# Patient Record
Sex: Male | Born: 1950 | Race: White | Hispanic: No | Marital: Married | Smoking: Former smoker
Health system: Southern US, Community
[De-identification: ages and names within clinical notes are randomized; demographics above are authoritative.]

## PROBLEM LIST (undated history)

## (undated) DIAGNOSIS — R413 Other amnesia: Secondary | ICD-10-CM

## (undated) DIAGNOSIS — Z8719 Personal history of other diseases of the digestive system: Secondary | ICD-10-CM

## (undated) DIAGNOSIS — I6529 Occlusion and stenosis of unspecified carotid artery: Secondary | ICD-10-CM

## (undated) DIAGNOSIS — E785 Hyperlipidemia, unspecified: Secondary | ICD-10-CM

## (undated) DIAGNOSIS — Z87898 Personal history of other specified conditions: Secondary | ICD-10-CM

## (undated) DIAGNOSIS — G4730 Sleep apnea, unspecified: Secondary | ICD-10-CM

## (undated) DIAGNOSIS — L309 Dermatitis, unspecified: Secondary | ICD-10-CM

## (undated) DIAGNOSIS — R0602 Shortness of breath: Secondary | ICD-10-CM

## (undated) DIAGNOSIS — G8929 Other chronic pain: Secondary | ICD-10-CM

## (undated) DIAGNOSIS — F0390 Unspecified dementia without behavioral disturbance: Secondary | ICD-10-CM

## (undated) DIAGNOSIS — I714 Abdominal aortic aneurysm, without rupture, unspecified: Secondary | ICD-10-CM

## (undated) DIAGNOSIS — I639 Cerebral infarction, unspecified: Secondary | ICD-10-CM

## (undated) DIAGNOSIS — E079 Disorder of thyroid, unspecified: Secondary | ICD-10-CM

## (undated) DIAGNOSIS — M545 Low back pain, unspecified: Secondary | ICD-10-CM

## (undated) DIAGNOSIS — R51 Headache: Secondary | ICD-10-CM

## (undated) DIAGNOSIS — G459 Transient cerebral ischemic attack, unspecified: Secondary | ICD-10-CM

## (undated) DIAGNOSIS — I10 Essential (primary) hypertension: Secondary | ICD-10-CM

## (undated) DIAGNOSIS — I219 Acute myocardial infarction, unspecified: Secondary | ICD-10-CM

## (undated) DIAGNOSIS — M1990 Unspecified osteoarthritis, unspecified site: Secondary | ICD-10-CM

## (undated) DIAGNOSIS — K219 Gastro-esophageal reflux disease without esophagitis: Secondary | ICD-10-CM

## (undated) DIAGNOSIS — J439 Emphysema, unspecified: Secondary | ICD-10-CM

## (undated) DIAGNOSIS — L932 Other local lupus erythematosus: Secondary | ICD-10-CM

## (undated) DIAGNOSIS — I2510 Atherosclerotic heart disease of native coronary artery without angina pectoris: Secondary | ICD-10-CM

## (undated) DIAGNOSIS — E039 Hypothyroidism, unspecified: Secondary | ICD-10-CM

## (undated) HISTORY — DX: Other amnesia: R41.3

## (undated) HISTORY — DX: Atherosclerotic heart disease of native coronary artery without angina pectoris: I25.10

## (undated) HISTORY — PX: HERNIA REPAIR: SHX51

## (undated) HISTORY — DX: Transient cerebral ischemic attack, unspecified: G45.9

## (undated) HISTORY — PX: OTHER SURGICAL HISTORY: SHX169

## (undated) HISTORY — DX: Acute myocardial infarction, unspecified: I21.9

## (undated) HISTORY — DX: Abdominal aortic aneurysm, without rupture: I71.4

## (undated) HISTORY — DX: Disorder of thyroid, unspecified: E07.9

## (undated) HISTORY — DX: Emphysema, unspecified: J43.9

## (undated) HISTORY — DX: Occlusion and stenosis of unspecified carotid artery: I65.29

## (undated) HISTORY — DX: Cerebral infarction, unspecified: I63.9

## (undated) HISTORY — DX: Hyperlipidemia, unspecified: E78.5

## (undated) HISTORY — DX: Gastro-esophageal reflux disease without esophagitis: K21.9

## (undated) HISTORY — DX: Sleep apnea, unspecified: G47.30

## (undated) HISTORY — DX: Abdominal aortic aneurysm, without rupture, unspecified: I71.40

## (undated) HISTORY — PX: SHOULDER SURGERY: SHX246

## (undated) HISTORY — PX: HIATAL HERNIA REPAIR: SHX195

## (undated) HISTORY — DX: Essential (primary) hypertension: I10

---

## 1998-01-26 ENCOUNTER — Ambulatory Visit (HOSPITAL_COMMUNITY): Admission: RE | Admit: 1998-01-26 | Discharge: 1998-01-26 | Payer: Self-pay | Admitting: Internal Medicine

## 2003-12-14 ENCOUNTER — Encounter (HOSPITAL_COMMUNITY): Admission: RE | Admit: 2003-12-14 | Discharge: 2003-12-15 | Payer: Self-pay | Admitting: Endocrinology

## 2003-12-25 ENCOUNTER — Ambulatory Visit (HOSPITAL_COMMUNITY): Admission: RE | Admit: 2003-12-25 | Discharge: 2003-12-25 | Payer: Self-pay | Admitting: Endocrinology

## 2006-09-21 ENCOUNTER — Emergency Department (HOSPITAL_COMMUNITY): Admission: EM | Admit: 2006-09-21 | Discharge: 2006-09-21 | Payer: Self-pay | Admitting: Emergency Medicine

## 2006-09-24 ENCOUNTER — Inpatient Hospital Stay (HOSPITAL_COMMUNITY): Admission: AD | Admit: 2006-09-24 | Discharge: 2006-09-28 | Payer: Self-pay | Admitting: Cardiovascular Disease

## 2006-09-24 ENCOUNTER — Encounter: Payer: Self-pay | Admitting: Emergency Medicine

## 2006-09-24 HISTORY — PX: CORONARY ANGIOPLASTY WITH STENT PLACEMENT: SHX49

## 2006-09-27 HISTORY — PX: CARDIAC CATHETERIZATION: SHX172

## 2007-09-04 DIAGNOSIS — I219 Acute myocardial infarction, unspecified: Secondary | ICD-10-CM

## 2007-09-04 HISTORY — PX: CORONARY ANGIOPLASTY WITH STENT PLACEMENT: SHX49

## 2007-09-04 HISTORY — DX: Acute myocardial infarction, unspecified: I21.9

## 2008-09-21 ENCOUNTER — Ambulatory Visit (HOSPITAL_COMMUNITY): Admission: RE | Admit: 2008-09-21 | Discharge: 2008-09-21 | Payer: Self-pay | Admitting: *Deleted

## 2008-09-21 ENCOUNTER — Encounter (INDEPENDENT_AMBULATORY_CARE_PROVIDER_SITE_OTHER): Payer: Self-pay | Admitting: *Deleted

## 2009-01-01 ENCOUNTER — Encounter: Admission: RE | Admit: 2009-01-01 | Discharge: 2009-01-01 | Payer: Self-pay | Admitting: Cardiovascular Disease

## 2009-01-07 ENCOUNTER — Ambulatory Visit (HOSPITAL_COMMUNITY): Admission: RE | Admit: 2009-01-07 | Discharge: 2009-01-07 | Payer: Self-pay | Admitting: Cardiovascular Disease

## 2009-01-07 HISTORY — PX: CARDIAC CATHETERIZATION: SHX172

## 2009-12-21 ENCOUNTER — Encounter: Admission: RE | Admit: 2009-12-21 | Discharge: 2009-12-21 | Payer: Self-pay | Admitting: Endocrinology

## 2010-06-26 ENCOUNTER — Encounter: Payer: Self-pay | Admitting: Endocrinology

## 2010-10-18 NOTE — Op Note (Signed)
NAMECLAUDIO, Matthew Evans                  ACCOUNT NO.:  [id number]   MEDICAL RECORD NO.:  [id number]          PATIENT TYPE:  AMB   LOCATION:  ENDO                         FACILITY:  [facility]   PHYSICIAN:  Georgiana Spinner, M.D.    DATE OF BIRTH:  1950-09-15   DATE OF PROCEDURE:  09/21/2008  DATE OF DISCHARGE:                               OPERATIVE REPORT   PROCEDURE:  Colonoscopy.   INDICATIONS:  Screening.   ANESTHESIA:  None further given.   PROCEDURE:  With the patient mildly sedated in the left lateral  decubitus position, a rectal exam was performed which was unremarkable  to my examination.  Subsequently the Pentax videoscopic colonoscope was  inserted in the rectum and passed rather easily to the cecum identified  by ileocecal valve and appendiceal orifice both of which were  photographed.  From this point, the colonoscope was slowly withdrawn  taking circumferential views of the colonic mucosa stopping in the  rectum which appeared normal on direct and showed hemorrhoids on  retroflexed view.  The endoscope was straightened and withdrawn.  The  patient's vital signs and pulse oximeter remained stable.  The patient  tolerated the procedure well without apparent complications.   FINDINGS:  Internal hemorrhoids, otherwise unremarkable exam.   PLAN:  Repeat examination in 5 years.           ______________________________  Georgiana Spinner, M.D.     GMO/MEDQ  D:  09/21/2008  T:  09/21/2008  Job:  [id number]

## 2010-10-18 NOTE — Cardiovascular Report (Signed)
NAMEGARDINER, ESPANA NO.:  [id number]   MEDICAL RECORD NO.:  [id number]          PATIENT TYPE:  OIB   LOCATION:  2899                         FACILITY:  [facility]   PHYSICIAN:  Nicki Guadalajara, M.D.     DATE OF BIRTH:  05/31/1951   DATE OF PROCEDURE:  01/07/2009  DATE OF DISCHARGE:                            CARDIAC CATHETERIZATION   INDICATIONS:  [name] is a 60 year old gentleman who suffered an  ST-segment elevation myocardial infarction in April 2008 and underwent  PTCA and stenting of his right coronary artery.  Subsequently he  underwent staged intervention with cutting balloon atherotomy to high-  grade LAD lesion, which involved multiple branches and since an  excellent angiographic result was obtained with cutting balloon, this  area was not stented so as not to jail these multiple branches.  He does  have a history of hypertension, hyperlipidemia, as well as  hypothyroidism.  Unfortunately, he has continued to smoke 2 packs of  cigarettes per day.  He also has a documented abdominal aortic aneurysm  measuring approximately 3.4 x 3.3 cm.  A recent myocardial perfusion  study showed a small in size moderate in intensity perfusion defect in  the mid anterior septal wall as well as a defect inferiorly.  There was  very mild peri-infarction inferior ischemia.  His post stress ejection  fraction was decreased to 47% from 55%.  With his ongoing tobacco use,  slight change in his nuclear perfusion study, definitive cardiac  catheterization was recommended.   PROCEDURE:  After premedication with Versed and fentanyl 25 mcg, the  patient was prepped and draped in usual fashion.  His right femoral  artery was punctured anteriorly and a 5-French sheath was inserted  without difficulty.  Diagnostic cardiac catheterization was done  utilizing 5-French Judkins for left and right coronary catheters.  A 5-  French pigtail catheter was used for biplane  cineventriculography as  well as distal aortography.  Hemostasis was obtained by direct manual  pressure.  The patient tolerated procedure well.   HEMODYNAMIC DATA:  Central aortic pressure was 110/52.  Left ventricular  pressure was 110/16.   ANGIOGRAPHIC DATA:  Left main coronary artery was a long vessel, which  had mild lumpy bumpy luminal irregularities with narrowings of 10% prior  to trifurcating into a moderate size LAD system, a diminutive  intermediate vessel and a left circumflex system.   The LAD had 20% ostial narrowing.  In the proximal to mid LAD in the  region of cutting balloon atherotomy, there was smooth 30% narrowing  without significant stenosis at site of 2 septal branches and 2 diagonal  branches.  The mid LAD had a small segment that dipped  intramyocardially, but was free of significant disease and the LAD  wrapped around the LV apex.   The intermediate vessel was a diminutive vessel.   The circumflex vessel was angiographically normal, gave rise to one  major marginal like vessel.   The right coronary artery was a moderate-sized dominant vessel that had  a widely patent proximal stent.  There was  a small anterior RV marginal  branch arising from the distal aspect of the stent that was diminutive  in diameter, but had 90% ostial stenosis.  In the mid RCA beyond the  stented segment, there was smooth eccentric 50% to less than 60%  narrowing followed by another area of 30% narrowing.  RCA supplied the  PDA and ended in inferior LV branch and posterolateral vessel.   Biplane cineventriculography revealed preserved global contractility.  There was a very minimal focal region of subtle distal inferior and  inferolateral hypocontractility seen on the RAO and LAO projections.   Distal aortography demonstrated normal renal arteries.  There was mild-  to-moderate aneurysmal dilatation in the infrarenal aorta proximal to  the iliac bifurcation.   IMPRESSION:   1. Preserved global left ventricular function with mild focal distal      inferior to inferolateral hypocontractility.  2. Luminal irregularity of 10% in the left main coronary artery.      Next, 20% ostial LAD stenosis without significant restenosis at the      site of cutting balloon atherotomy with residual smooth 30%      narrowing in the proximal to mid segment.  3. Normal circumflex coronary artery.  4. Widely patent stent in the proximal right coronary artery with      evidence for a small RV marginal branch arising from the stented      segment with 90% ostial stenosis, and smooth eccentric 50% to less      than 60% narrowing in the mid RCA beyond the stented segment      followed by another area of 30% narrowing.   RECOMMENDATION:  Medical therapy.  Again, the absolute importance of  complete smoking cessation was discussed with the patient.  Medications  will be adjusted.           ______________________________  Nicki Guadalajara, M.D.     TK/MEDQ  D:  01/07/2009  T:  01/07/2009  Job:  [id number]

## 2010-10-18 NOTE — Op Note (Signed)
Matthew Evans, Matthew Evans                  ACCOUNT NO.:  [id number]   MEDICAL RECORD NO.:  [id number]          PATIENT TYPE:  AMB   LOCATION:  ENDO                         FACILITY:  [facility]   PHYSICIAN:  Georgiana Spinner, M.D.    DATE OF BIRTH:  1951/03/29   DATE OF PROCEDURE:  09/21/2008  DATE OF DISCHARGE:                               OPERATIVE REPORT   PROCEDURE:  Upper endoscopy.   INDICATIONS:  GERD.   ANESTHESIA:  1. Fentanyl 100 mcg.  2. Versed 10 mg.   PROCEDURE:  With patient mildly sedated in the left lateral decubitus  position, the Pentax videoscopic endoscope was inserted in the mouth,  passed under direct vision through the esophagus, which appeared normal,  into the stomach, fundus, body, antrum.  The duodenal bulb, second  portion of duodenum appeared normal.  From this point the endoscope was  slowly withdrawn, taking circumferential views of the duodenal mucosa  until the endoscope had been pulled back into the stomach, placed in  retroflexion to view the stomach from below.  The endoscope was  straightened and withdrawn, taking circumferential views of the  remaining gastric and esophageal mucosa.  The patient's vital signs,  pulse oximeter remained stable.  The patient tolerated procedure well  without apparent complication.   FINDINGS:  There was some food seen in the stomach that was undigested  but a small amount, but otherwise an unremarkable examination.   PLAN:  Will have patient follow up with me as needed for this since it  appears he is pretty asymptomatic currently and proceed to colonoscopy  as planned.           ______________________________  Georgiana Spinner, M.D.     GMO/MEDQ  D:  09/21/2008  T:  09/21/2008  Job:  [id number]

## 2010-10-21 NOTE — Cardiovascular Report (Signed)
Matthew Evans, Matthew Evans NO.:  [id number]   MEDICAL RECORD NO.:  [id number]          PATIENT TYPE:  INP   LOCATION:  2923                         FACILITY:  [facility]   PHYSICIAN:  Nicki Guadalajara, M.D.     DATE OF BIRTH:  02-23-1951   DATE OF PROCEDURE:  DATE OF DISCHARGE:                            CARDIAC CATHETERIZATION   PROCEDURE:  Cardiac catheterization and percutaneous coronary  intervention.   INDICATIONS:  [name] is a 60 year old gentleman who has a  history of hypothyroidism, greater than 40 pack-year history of tobacco  use, who has experienced episodes of chest discomfort over the past  several days.  He apparently had presented to the emergency room on  Friday with a temperature of 101 and some vague chest discomfort.  He  was seen by the emergency room physicians, was treated with inhalers and  Zithromax.  Apparently the patient continued to have recurrent episodes  of chest tightness over the weekend.  He presented to [facility]  emergency room this morning.  We were asked to see him for evaluation.  ECG is worrisome for the inferior ischemia with flipped T-waves in II,  III and F and V3-V4.  Troponin is positive at 2.59.  CK-MB positive  18.7.  The patient is currently pain free.  He was started on heparin  and integralin and scheduled for cardiac catheterization today.   PROCEDURE:  After premedication with Versed 2 mg intravenously, the  patient prepped and draped in usual fashion.  His right femoral artery  was punctured anteriorly and a 5-French sheath was inserted.  Diagnostic  catheterization was done utilizing 5-French Judkins for left and right  coronary catheters.  A 5-French pigtail catheter was used for biplane  cine left ventriculography.  The pigtail cath was also used for distal  aortography as well as aortography with bifemoral runoff.  With a  demonstration of probable culprit lesion in his right coronary artery  although he  did have disease in the LAD, decision was made to attempt  intervention to the RCA.  The patient was treated and was given 600 mg  oral Plavix.  His 5-French sheath was upgraded to a 6-French sheath.  He  was given heparin per protocol.  ACT ultimately still was subtherapeutic  and there was question of his peripheral IV site where it was  administered not working well.  During procedure he received an  additional 3000 units of heparin.  Coronary intervention was done with a  6-French 3DRC  cordis guide.  A Luge wire was advanced down the RCA.  Predilatation was done with a 2.5 x 15-mm Maverick.  A 3.5 x 20 mm Taxus  stent was then successfully inserted and deployed x2 up to 16  atmospheres.  Post stent dilatation was done utilizing a 4.0 x 15-mm  Quantum balloon with 3 post dilatations made up to 3.96 mm.  Scout  angiography confirmed an excellent angiographic result.  The patient  tolerated procedure well and returned in satisfactory condition.   HEMODYNAMIC DATA:  Central aortic pressure was 115/63. Left  ventricular  pressure was 115/19.   ANGIOGRAPHIC DATA:  Left main coronary was angiographically normal and  trifurcated into LAD, ramus intermediate vessel and left circumflex  system.   The LAD was a moderate size vessel.  Vessel gave rise to one proximal  diagonal vessel and proximal septal perforating artery.  In the mid LAD,  there was a 75-80% eccentric diffuse stenosis which encompassed four  branches including two small size septal perforating arteries and two  diagonal vessels which arose in the immediate vicinity.  IC  nitroglycerin was administered which did not significantly improve the  lesion.  On the lateral view in particular this appeared to be an  eccentric lesion with superior plaque.   The ramus intermediate vessel was angiographically normal.   The circumflex vessel was angiographically normal gave rise to one  marginal vessel.   The right coronary was a  moderate size dominant vessel that had a 95%  focal stenosis with suggestion of nonocclusive thrombus.  The vessel  supplied a PDA several inferior LV branches and a small posterolateral  and moderate size posterolateral system.   Biplane selective artery revealed low normal LV function with mild  inferior hypercontractility in the RAO projection and with inferoapical  to low posterolateral mild hypercontractility on the LAO projection.   Distal aortography and abdominal aortic runoff was performed.  There was  no renal artery stenosis.  There was mild aneurysmal dilatation of the  infrarenal aorta just proximal to the iliac bifurcation.  There was mild  20-30% narrowing in the right common iliac artery.   Following successful percutaneous coronary intervention to the right  coronary artery with PTCA and stenting, the 95% stenosis with  nonocclusive thrombus was reduced to 0%.  The vessel was post dilated at  3.96 mm.  There was brisk TIMI III flow.  There was no evidence for  dissection.   IMPRESSION:  1. Acute coronary syndrome secondary to subtotal RCA occlusion with      95% stenosis proximally with nonocclusive thrombus.  2. Smooth eccentric 75-80% mid-LAD stenosis encompassing takeoff of      four vessels, two small septal perforating arteries and two      diagonal vessels.  3. Normal ramus intermediate and left circumflex system.  4. Low normal to mild LV dysfunction with mild hypercontractility      inferiorly and involving the inferoapical to low posterolateral      wall.  5. 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  6. Successful PTCA/stenting of the right coronary artery with ultimate      insertion of a 3.5 x 20 mm Taxus drug-eluting stent post dilated to     3.96 mm done with integralin/heparin/oral  Plavix/aspirin.   RECOMMENDATIONS:  [name] will be started on medical therapy including  beta blocker, ACE inhibition, aggressive stent therapy.  Smoking  cessation will be necessary.  He will most likely undergo staged  intervention to his LAD system electively.           ______________________________  Nicki Guadalajara, M.D.     TK/MEDQ  D:  09/24/2006  T:  09/25/2006  Job:  [id number]   cc:   Nicki Guadalajara, M.D.  Patient Chart

## 2010-10-21 NOTE — Discharge Summary (Signed)
NAMEKENWOOD, ROSIAK NO.:  [id number]   MEDICAL RECORD NO.:  [id number]          PATIENT TYPE:  INP   LOCATION:  2923                         FACILITY:  [facility]   PHYSICIAN:  Nicki Guadalajara, M.D.     DATE OF BIRTH:  1951/03/06   DATE OF ADMISSION:  09/24/2006  DATE OF DISCHARGE:  09/28/2006                               DISCHARGE SUMMARY   HISTORY OF PRESENT ILLNESS:  Mr. Pangborn is a 60 year old male patient who  came in with chest pain.  He had been seen in the ER Friday with a cough  and a temperature.  He was sent home with inhalers and Zithromax.  His  symptoms apparently somewhat resolved over the weekend, but then they  came back, and he came back to the emergency room.  His troponin was  elevated, and his EKG showed inferolateral T wave inversion.  He was  then taken for a catheterization on September 24, 2006, by Dr. Nicki Guadalajara.  He was found to have 95% RCA with clot.  He also had a 75% to 80% in his  LAD.  His EF was 50%.  He went on to have a Taxus stent placed in his  RCA with plans for staged procedure of his LAD in a couple of days.   Post procedure, he did well, and on September 27, 2006, he underwent cutting  balloon of his LAD, and no stenting was done.  He had cardiac  medications added and titrated during his hospitalization, and he is  considered stable for discharge home on September 28, 2006.   DISCHARGE LABORATORY DATA:  Hemoglobin 13.5, hematocrit 40.4, WBC 7.4,  platelets 276, sodium 139, potassium 3.8, BUN 11, creatinine 1.09.  CK-  MBs after the second procedure were 68/1.6, troponin of 1.61 and  0.63/1.4, troponin of 1.53, and 64/1.5, troponin of 1.24.   DISCHARGE MEDICATIONS:  1. Levothyroxine 175 mcg a day.  2. Plavix 75 mg a day.  3. He is not to stop his aspirin 325 mg at bedtime 30 minutes prior to      his Niaspan 500 mg.  4. Toprol-XL 25 mg a day.  5. Imdur 30 mg a day.  6. Altace 2.5 mg twice per day.  7. Simvastatin 40 mg at  bedtime.  8. Prilosec 20 mg every day x1 month.  9. Nitroglycerin 1/150 under tongue every five minutes x3 as needed      for chest pain.  He was told if his pulse is less than 50 or he is      lightheaded, he should cut his Toprol XL in half.   He was seen by Cardiac Rehabilitation.  He was referred for cardiac  rehabilitation program.  He was seen by the smoking-cessation personnel,  and he is willing and stated that he definitely quitting smoking.   His EKG continues to show T-wave inversions in inferior leads, and he  also has Q waves in III and aVF.   DISCHARGE DIAGNOSES:  1. Acute inferior myocardial infarction, probably out of hospital.  He  had an urgent catheterization with Taxus stenting to his right      coronary artery.  He had residual disease, and he subsequently went      for a staged procedure of cutting balloon of his left anterior      descending two days later.  2. An ejection fraction of 50%.  3. Hypertension.  4. Dyslipidemia with very low HDL being treated with Niaspan and a      statin.  5. Hypertension.  6. Tobacco abuse with the patient committed to cessation.      Lezlie Octave, N.P.    ______________________________  Nicki Guadalajara, M.D.    BB/MEDQ  D:  09/28/2006  T:  09/28/2006  Job:  [id number]

## 2010-10-21 NOTE — Cardiovascular Report (Signed)
Matthew Evans, Matthew Evans NO.:  [id number]   MEDICAL RECORD NO.:  [id number]          PATIENT TYPE:  INP   LOCATION:  2923                         FACILITY:  [facility]   PHYSICIAN:  Nicki Guadalajara, M.D.     DATE OF BIRTH:  September 18, 1950   DATE OF PROCEDURE:  09/27/2006  DATE OF DISCHARGE:                            CARDIAC CATHETERIZATION   INDICATIONS:  [name] is a 60 year old gentleman who presented to  [facility] Emergency Room on Monday, September 24, 2006.  Cardiac enzymes  were positive and he had inferior T-wave inversion compatible with acute  coronary syndrome/non-ST-segment MI.  Most likely, he had experienced  this over the weekend with recurrent episodes of chest discomfort.  He  was taken to the cardiac catheterization laboratory and was found to  have 95% proximal RCA stenosis with nonocclusive thrombus.  This was  successfully intervened upon with ultimate insertion of a 3.5 x 20 Taxus  stent postdilated to 4 cm.  At the time, he was also noted to have an  80% eccentric shelf-like stenosis in the mid LAD involving the  quadrification of 4 vessels including 2 septals and 2 diagonal vessels.  The patient has done exceptionally well following his RCA intervention.  He is now brought back to the laboratory to attempt intervention to the  LAD system.   PROCEDURE:  The patient has been on Plavix.  The right femoral artery  was punctured anteriorly and a 6-French sheath was inserted.  Double-  bolus internal and weight-adjusted heparinization were administered.  ACT was documented to be therapeutic.  Initially, a diagnostic right  catheter was inserted and scout angiography revealed an excellent  angiographic result from the previous intervention with widely patent  stent in the RCA proximally.  Attention was then directed at the left  coronary system.  A 6-French FLQ curved 4 guide was used for the  procedure.  A Prowater wire was advanced down the LAD.  The  lateral  views really demonstrated that there was a significant superior  eccentric stenosis with narrowing of at least 80%.  Due to the 4 vessels  arising from this diseased segment, the initial plan was to attempt  cutting balloon arthrotomy for treatment unless an inadequate result was  obtained.  Initially, a 2.5 x 15-mm cutting balloon was inserted.  Multiple dilatations were made up to 7 atmospheres.  This was then  upgraded to a 3.0 x 15-mm cutting balloon.  Again, multiple cuts were  made with significant improvement in luminal diameter.  However, it  became apparent that the vessel was most likely of 3-4 vessel.  Consequently, a 3.5 x 15 mm cutting balloon was then inserted and 3  dilatations were made up to 4 atmospheres, corresponding to 3.42-mm  size.  Scout angiography confirmed an excellent angiographic result with  the 80% stenosis being reduced to 0%.  There was brisk TIMI-3 flow and  the vessel beyond the lesion now had significantly plumped up.  The  patient did receive at least three doses of IC nitroglycerin during the  procedure.  He tolerated the procedure well.   HEMODYNAMIC DATA:  Central aortic pressure was 117/65.   ANGIOGRAPHIC DATA:  The right coronary artery was a large-caliber  vessel.  The site of previous 95% stenosis prior nonocclusive thrombus  was now 100% opened without any residual narrowing.  There was brisk  TIMI-3 flow in a large RCA system.   The left anterior descending artery arose from the left main coronary  artery.  In the proximal to mid segment, there was significant eccentric  80% stenosis, which was seen best on the lateral views with superior  shelf.  At this site were 4 vessels which arose including 2 septal  perforating arteries and 2 diagonal vessels.  Following sequential  cutting balloon arthrotomy with ultimate dilatation with a 3.5 x 15-mm  cutting balloon, the 80% stenosis was reduced to 0%.  There was  significant improvement  with resolution of eccentricity and no  significant plaque shifting into the branch vessels.  There was brisk  TIMI-3 flow and the vessel beyond the lesion was now large-caliber  rather than fairly small secondary to the prior high-grade stenosis.   IMPRESSION:  1. Successful cutting balloon arthrotomy of an 80% eccentric left      anterior descending stenosis involving the takeoff of 4 branches      with the 80% stenosis being reduced to 0% with ultimate dilatation      of a 3.5 x 15-mm cutting balloon.  2. Widely patent right coronary artery at the site of recent acute      coronary syndrome/non-ST-segment inferior myocardial infarction      with intervention done on September 24, 2006.  3. Double-bolus Integrilin/weight-adjusted heparinization.           ______________________________  Nicki Guadalajara, M.D.     TK/MEDQ  D:  09/27/2006  T:  09/27/2006  Job:  [id number]   cc:   Brooke Bonito, M.D.

## 2010-10-21 NOTE — H&P (Signed)
NAMEILARIO, DHALIWAL NO.:  [id number]   MEDICAL RECORD NO.:  [id number]         PATIENT TYPE:  LEMS   LOCATION:                               FACILITY:  [facility]   PHYSICIAN:  Nicki Guadalajara, M.D.     DATE OF BIRTH:  May 01, 1951   DATE OF ADMISSION:  09/24/2006  DATE OF DISCHARGE:                              HISTORY & PHYSICAL   CHIEF COMPLAINT:  Chest pain and arm pain.   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  He was treated with  inhalers and a Zithromax pack.  His symptoms started Friday and he had  no prior history of arm pain or chest pain or exertional dyspnea.  He  said his symptoms were unlike anything he has felt before.  This past  weekend he took it easy.  He had no recurrent arm pain or chest pain  until this morning, about 5 a.m.  He came to the emergency room.  In the  emergency room his troponin is elevated and his EKG has inferolateral T  wave inversion.  He has been treated with heparin and is currently  symptom-free.   PAST MEDICAL HISTORY:  Remarkable for treated hypothyroidism.  He had  recent cataract surgery in his left eye 2 weeks ago.   CURRENT MEDICATIONS AT HOME:  1. Synthroid 0.175 mg a day.  2. Zithromax.   HE HAS NO KNOWN DRUG ALLERGIES.   SOCIAL HISTORY:  He is engaged.  He lives with his girlfriend.  He  smokes a pack and a half a day.  He has not had alcohol for 11 years.  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  He has 3 children.   FAMILY HISTORY:  Remarkable in that his father had an MI in  his 19's,  he is still alive in his 3's.   REVIEW OF SYSTEMS:  Essentially unremarkable except for noted above.  He  denies any kidney disease or trouble voiding.  He has not had prostate  trouble.  He  has never had GI bleeding, denies melena.  He has not had a  stroke or TIA in the past.   PHYSICAL EXAM:  Blood pressure 132/72, pulse 62, temp 97.7.  GENERAL:  He is a well-developed, well-nourished male in no acute  distress.  HEENT:  Normocephalic.  Extraocular movements are intact.  Sclera is  nonicteric.  NECK:  Without JVD or bruit.  Thyroid is not enlarged.  CHEST:  Clear to auscultation and percussion.  CARDIAC EXAM:  Reveals regular rate and rhythm without obvious murmur,  rub or gallop.  Normal S1, S2.  ABDOMEN:  Nontender, no hepatosplenomegaly.  EXTREMITIES:  Without edema.  Distal pulses are 2+/4 in the left and 1  to 2+/4 on the right.  There are no bruits  noted.  NEURO EXAM:  Grossly intact.  He is awake, alert and oriented,  cooperative, moves all extremities without obvious deficits.  SKIN:  Warm and dry.   His EKG shows sinus rhythm with T wave inversion in 2, 3 and F and V3-  V4.  Hematology profile shows a sodium 140, potassium 4, BUN 18,  creatinine 1, fasting glucose of 102.  White count 8.2, hemoglobin 14.7,  hematocrit 43.4, platelets 252,000.  D-dimer is 0.57.  Troponin is 2.59,  MB is 18.7.  Chest x-ray is pending.   IMPRESSION:  1. DMI, probably Friday, April 18th.  2. History of smoking.  3. Treated hypothyroidism.   PLAN:  Patient will be started on IV heparin, nitroglycerin and  Integrilin.  Will also add beta-blocker and Statin.  He will be  transferred to [facility] for catheterization today.      Matthew Evans, P.A.    ______________________________  Nicki Guadalajara, M.D.    Lenard Lance  D:  09/24/2006  T:  09/24/2006  Job:  [id number]

## 2011-03-13 ENCOUNTER — Encounter: Payer: Self-pay | Admitting: Vascular Surgery

## 2011-03-20 ENCOUNTER — Encounter: Payer: Self-pay | Admitting: Vascular Surgery

## 2011-03-21 ENCOUNTER — Encounter: Payer: Self-pay | Admitting: Vascular Surgery

## 2011-03-31 ENCOUNTER — Emergency Department (HOSPITAL_COMMUNITY): Payer: BC Managed Care – PPO

## 2011-03-31 ENCOUNTER — Emergency Department (HOSPITAL_COMMUNITY)
Admission: EM | Admit: 2011-03-31 | Discharge: 2011-04-01 | Disposition: A | Discharge status: Home / Self Care | Payer: BC Managed Care – PPO | Attending: Emergency Medicine | Admitting: Emergency Medicine

## 2011-03-31 DIAGNOSIS — R059 Cough, unspecified: Secondary | ICD-10-CM | POA: Insufficient documentation

## 2011-03-31 DIAGNOSIS — E039 Hypothyroidism, unspecified: Secondary | ICD-10-CM | POA: Insufficient documentation

## 2011-03-31 DIAGNOSIS — R42 Dizziness and giddiness: Secondary | ICD-10-CM | POA: Insufficient documentation

## 2011-03-31 DIAGNOSIS — J40 Bronchitis, not specified as acute or chronic: Secondary | ICD-10-CM | POA: Insufficient documentation

## 2011-03-31 DIAGNOSIS — R0789 Other chest pain: Secondary | ICD-10-CM | POA: Insufficient documentation

## 2011-03-31 DIAGNOSIS — R112 Nausea with vomiting, unspecified: Secondary | ICD-10-CM | POA: Insufficient documentation

## 2011-03-31 DIAGNOSIS — R05 Cough: Secondary | ICD-10-CM | POA: Insufficient documentation

## 2011-03-31 DIAGNOSIS — I2510 Atherosclerotic heart disease of native coronary artery without angina pectoris: Secondary | ICD-10-CM | POA: Insufficient documentation

## 2011-03-31 LAB — CBC
Hemoglobin: 14.2 g/dL (ref 13.0–17.0)
MCHC: 34.2 g/dL (ref 30.0–36.0)
RBC: 4.85 MIL/uL (ref 4.22–5.81)

## 2011-03-31 LAB — CK TOTAL AND CKMB (NOT AT ARMC)
CK, MB: 1.9 ng/mL (ref 0.3–4.0)
Relative Index: 0.3 (ref 0.0–2.5)

## 2011-03-31 LAB — COMPREHENSIVE METABOLIC PANEL
AST: 27 U/L (ref 0–37)
Albumin: 3.6 g/dL (ref 3.5–5.2)
BUN: 22 mg/dL (ref 6–23)
CO2: 26 mEq/L (ref 19–32)
Calcium: 9.3 mg/dL (ref 8.4–10.5)
Glucose, Bld: 95 mg/dL (ref 70–99)
Total Bilirubin: 0.8 mg/dL (ref 0.3–1.2)
Total Protein: 7.4 g/dL (ref 6.0–8.3)

## 2011-03-31 LAB — POCT I-STAT TROPONIN I

## 2011-03-31 LAB — DIFFERENTIAL
Basophils Absolute: 0.1 10*3/uL (ref 0.0–0.1)
Eosinophils Relative: 2 % (ref 0–5)
Neutro Abs: 3.8 10*3/uL (ref 1.7–7.7)

## 2011-08-10 ENCOUNTER — Ambulatory Visit
Admission: RE | Admit: 2011-08-10 | Discharge: 2011-08-10 | Disposition: A | Discharge status: Home / Self Care | Payer: BC Managed Care – PPO | Source: Ambulatory Visit | Attending: Cardiovascular Disease | Admitting: Cardiovascular Disease

## 2011-08-10 ENCOUNTER — Other Ambulatory Visit: Payer: Self-pay | Admitting: Cardiovascular Disease

## 2011-08-10 DIAGNOSIS — Z01811 Encounter for preprocedural respiratory examination: Secondary | ICD-10-CM

## 2011-08-11 ENCOUNTER — Encounter (HOSPITAL_COMMUNITY)
Admission: RE | Disposition: A | Discharge status: Home / Self Care | Payer: Self-pay | Source: Ambulatory Visit | Attending: Cardiovascular Disease

## 2011-08-11 ENCOUNTER — Ambulatory Visit (HOSPITAL_COMMUNITY)
Admission: RE | Admit: 2011-08-11 | Discharge: 2011-08-11 | Disposition: A | Discharge status: Home / Self Care | Payer: BC Managed Care – PPO | Source: Ambulatory Visit | Attending: Cardiovascular Disease | Admitting: Cardiovascular Disease

## 2011-08-11 DIAGNOSIS — Z9861 Coronary angioplasty status: Secondary | ICD-10-CM | POA: Insufficient documentation

## 2011-08-11 DIAGNOSIS — I714 Abdominal aortic aneurysm, without rupture, unspecified: Secondary | ICD-10-CM | POA: Insufficient documentation

## 2011-08-11 DIAGNOSIS — I2510 Atherosclerotic heart disease of native coronary artery without angina pectoris: Secondary | ICD-10-CM | POA: Insufficient documentation

## 2011-08-11 DIAGNOSIS — R9439 Abnormal result of other cardiovascular function study: Secondary | ICD-10-CM | POA: Insufficient documentation

## 2011-08-11 HISTORY — PX: LEFT HEART CATHETERIZATION WITH CORONARY ANGIOGRAM: SHX5451

## 2011-08-11 HISTORY — PX: CARDIAC CATHETERIZATION: SHX172

## 2011-08-11 SURGERY — LEFT HEART CATHETERIZATION WITH CORONARY ANGIOGRAM
Anesthesia: LOCAL

## 2011-08-11 MED ORDER — HEPARIN (PORCINE) IN NACL 2-0.9 UNIT/ML-% IJ SOLN
INTRAMUSCULAR | Status: AC
  Filled 2011-08-11: qty 2000

## 2011-08-11 MED ORDER — SODIUM CHLORIDE 0.9 % IJ SOLN: 3.0000 mL | Freq: Two times a day (BID) | INTRAMUSCULAR | Status: DC

## 2011-08-11 MED ORDER — NITROGLYCERIN 0.2 MG/ML ON CALL CATH LAB
INTRAVENOUS | Status: AC
  Filled 2011-08-11: qty 1

## 2011-08-11 MED ORDER — ONDANSETRON HCL 4 MG/2ML IJ SOLN: 4.0000 mg | Freq: Four times a day (QID) | INTRAMUSCULAR | Status: DC | PRN

## 2011-08-11 MED ORDER — SODIUM CHLORIDE 0.9 % IV SOLN: INTRAVENOUS | Status: DC

## 2011-08-11 MED ORDER — DIAZEPAM 5 MG PO TABS
5.0000 mg | ORAL_TABLET | ORAL | Status: AC
  Administered 2011-08-11: 5 mg via ORAL

## 2011-08-11 MED ORDER — MIDAZOLAM HCL 2 MG/2ML IJ SOLN
INTRAMUSCULAR | Status: AC
  Filled 2011-08-11: qty 2

## 2011-08-11 MED ORDER — ACETAMINOPHEN 325 MG PO TABS: 650.0000 mg | ORAL_TABLET | ORAL | Status: DC | PRN

## 2011-08-11 MED ORDER — LIDOCAINE HCL (PF) 1 % IJ SOLN
INTRAMUSCULAR | Status: AC
  Filled 2011-08-11: qty 30

## 2011-08-11 MED ORDER — SODIUM CHLORIDE 0.9 % IV SOLN: 250.0000 mL | INTRAVENOUS | Status: DC | PRN

## 2011-08-11 MED ORDER — SODIUM CHLORIDE 0.9 % IV SOLN
INTRAVENOUS | Status: DC
  Administered 2011-08-11: 09:00:00 via INTRAVENOUS

## 2011-08-11 MED ORDER — FENTANYL CITRATE 0.05 MG/ML IJ SOLN
INTRAMUSCULAR | Status: AC
  Filled 2011-08-11: qty 2

## 2011-08-11 MED ORDER — DIAZEPAM 5 MG PO TABS
ORAL_TABLET | ORAL | Status: AC
  Filled 2011-08-11: qty 1

## 2011-08-11 MED ORDER — SODIUM CHLORIDE 0.9 % IJ SOLN: 3.0000 mL | INTRAMUSCULAR | Status: DC | PRN

## 2011-08-11 NOTE — H&P (Signed)
  Updated H&P:  See complete dictated note from yesterday when pt was seen in the office and scheduled for today's procedure.  No recurrent chest pain.  Labs reviewed.  Cr 1.59.  Will increase IV fluids to 100cc/hr NS.  Plan cath with possible PCI this am.  Discussed again with wife and patient. Lennette Bihari, MD, [place] [facility] 08/11/2011 8:59 AM

## 2011-08-11 NOTE — Discharge Instructions (Signed)

## 2011-08-11 NOTE — Brief Op Note (Signed)
08/11/2011  1:30 PM  Cardiac Catherization  PATIENT:  Matthew Evans  61 y.o. male  PRE-OPERATIVE DIAGNOSIS:  Cad/ abnormal myoview study  Full note dictated; see diagram   DICTATION # [id number], [id number]  LM: 10% distal LAD: no restenosis at PTCA site; area of mid LAD intramyocardial segment LCX: nl RCA: no significant sent restenosis; 40% and 20 % mid-distal stenosis.  EF 50-55% with subtle distal anterolateral hypokinesis  Small AAA prox to iliac bifurcation.  Medical therapy.  Lennette Bihari, MD, [facility] 08/11/2011 1:31 PM

## 2011-08-12 NOTE — Cardiovascular Report (Signed)
Matthew Evans, SANS NO.:  [id number]  MEDICAL RECORD NO.:  [id number]  LOCATION:  [facility]                         FACILITY:  [facility]  PHYSICIAN:  Nicki Guadalajara, M.D.     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  At that time, he also was found to have high-grade LAD stenosis at the origin of at least 3-4 branches and he underwent stage cutting balloon atherotomy of this LAD lesion.  In August 2010, followup catheterization was done for recurrent chest pain, which did not demonstrate any significant restenosis of either site, but did show progressive 50-60% RCA narrowing beyond the stented segment.  There was a documented abdominal aortic aneurysm measuring approximately 3.3 x 3.7 cm. Recently, the patient has noticed some shortness of breath.  A nuclear perfusion study demonstrated new scar/ischemia at the apex and mid- distal anteroseptal region, which clearly was changed from his previous nuclear stress test suggesting potential LAD ischemia and defect.  He was seen in the office yesterday and is now scheduled for catheterizations today.  PROCEDURE:  After premedication with Versed 2 mg plus fentanyl 50 mcg, the patient was prepped and draped in usual fashion.  His right femoral artery was punctured anteriorly and a 5-French sheath was inserted without difficulty.  Diagnostic cardiac catheterizations was done utilizing 5-French Judkins 4 left and right coronary catheters.  IC nitroglycerin was administered selectively down the left coronary system to further evaluate the area of the mid-LAD intramyocardial segment with possible mild mid-systolic bridging.   A 5-French pigtail catheter was used for RAO ventriculography.  Distal aortography was also performed to further evaluate his abdominal aortic aneurysm.  He tolerated the procedure well.  He returned to his room in satisfactory condition.  HEMODYNAMIC DATA:  Central aortic pressure was 118/54, left ventricular pressure 180/15.  ANGIOGRAPHIC DATA:  Left main coronary artery was an angiographically normal long vessel that had mild luminal narrowing of 10% distally prior to trifurcating into an LAD, a small intermediate vessel and the left circumflex coronary artery.  The LAD had mild luminal irregularity with narrowing of less than 20% at the site of prior PTCA.  The midportion of the LAD seemed to dip intramyocardially and there was a component of very mild mid-systolic bridging.  IC nitroglycerin was administered with mild improvement. There did not appear to be any significant fixed obstructive disease greater than 20% in this intramyocardial segment.  The intermediate vessel was angiographically normal.  The circumflex vessel was angiographically normal.  The right coronary artery was a dominant vessel.  There was 10% mild luminal irregularity proximally.  The stent in the mid-RCA was widely patent.  There was 40% and 20% narrowing beyond the stented segment, which actually seem somewhat improved from his last catheterization.  The RCA supplied the PDA and posterolateral vessel.  Left ventriculography revealed ejection fraction of 50-55%.  There was very  minimal distal anterolateral subtle hypocontractility.  Distal aortography revealed widely patent renal arteries.  There was evidence for segmental aneurysmal dilatation in the infrarenal aorta proximal to the iliac bifurcation.  IMPRESSION: 1. Low normal left ventricular function with ejection fraction of 50-     55% with subtle mid-distal anterolateral hypocontractility. 2. Mild 10% distal left main stenosis. 3. No  significant restenosis inside of cutting balloon atherotomy in     the proximal to mid-left anterior descending artery and evidence     for mild mid-systolic bridging in the intramyocardial segment of     the mid-left anterior descending artery without significant     obstructive stenoses. 4. Normal small intermediate vessel. 5. Normal left circumflex vessel. 6. No significant restenosis in the right coronary artery with widely     patent stent at the mid-segment with 10% proximal narrowing prior     to the stented region and 20% narrowing in the mid-distal right     coronary artery after the stented segment, which actually was     somewhat improved from the prior study. 7. Evidence for small infrarenal abdominal aortic aneurysm.  RECOMMENDATION:  Medical therapy.          ______________________________ Nicki Guadalajara, M.D.     TK/MEDQ  D:  08/11/2011  T:  08/12/2011  Job:  [id number]

## 2012-02-15 LAB — PULMONARY FUNCTION TEST

## 2012-07-11 ENCOUNTER — Other Ambulatory Visit (HOSPITAL_COMMUNITY): Payer: Self-pay | Admitting: Cardiovascular Disease

## 2012-07-11 DIAGNOSIS — I119 Hypertensive heart disease without heart failure: Secondary | ICD-10-CM

## 2012-07-11 DIAGNOSIS — I2510 Atherosclerotic heart disease of native coronary artery without angina pectoris: Secondary | ICD-10-CM

## 2012-07-26 ENCOUNTER — Ambulatory Visit (HOSPITAL_COMMUNITY)
Admission: RE | Admit: 2012-07-26 | Discharge: 2012-07-26 | Disposition: A | Discharge status: Home / Self Care | Payer: BC Managed Care – PPO | Source: Ambulatory Visit | Attending: Cardiovascular Disease | Admitting: Cardiovascular Disease

## 2012-07-26 DIAGNOSIS — R0609 Other forms of dyspnea: Secondary | ICD-10-CM | POA: Insufficient documentation

## 2012-07-26 DIAGNOSIS — R079 Chest pain, unspecified: Secondary | ICD-10-CM | POA: Insufficient documentation

## 2012-07-26 DIAGNOSIS — I10 Essential (primary) hypertension: Secondary | ICD-10-CM | POA: Insufficient documentation

## 2012-07-26 DIAGNOSIS — R0989 Other specified symptoms and signs involving the circulatory and respiratory systems: Secondary | ICD-10-CM | POA: Insufficient documentation

## 2012-07-26 DIAGNOSIS — R002 Palpitations: Secondary | ICD-10-CM | POA: Insufficient documentation

## 2012-07-26 DIAGNOSIS — R42 Dizziness and giddiness: Secondary | ICD-10-CM | POA: Insufficient documentation

## 2012-07-26 DIAGNOSIS — I119 Hypertensive heart disease without heart failure: Secondary | ICD-10-CM

## 2012-07-26 DIAGNOSIS — I2510 Atherosclerotic heart disease of native coronary artery without angina pectoris: Secondary | ICD-10-CM

## 2012-07-26 MED ORDER — REGADENOSON 0.4 MG/5ML IV SOLN
0.4000 mg | Freq: Once | INTRAVENOUS | Status: AC
  Administered 2012-07-26: 0.4 mg via INTRAVENOUS

## 2012-07-26 MED ORDER — TECHNETIUM TC 99M SESTAMIBI GENERIC - CARDIOLITE
30.4000 | Freq: Once | INTRAVENOUS | Status: AC | PRN
  Administered 2012-07-26: 30 via INTRAVENOUS

## 2012-07-26 MED ORDER — TECHNETIUM TC 99M SESTAMIBI GENERIC - CARDIOLITE
10.3000 | Freq: Once | INTRAVENOUS | Status: AC | PRN
  Administered 2012-07-26: 10 via INTRAVENOUS

## 2012-07-26 NOTE — Procedures (Addendum)
[facility] [address] [place] [address] [place] [address] [phone]  Cardiology Nuclear Med Study  Matthew Evans is a 62 y.o. male     MRN : [id number]     DOB: 1950/08/30  Procedure Date: 07/26/2012  Nuclear Med Background Indication for Stress Test:  Evaluation for Ischemia, Stent Patency and PTCA Patency History:  COPD and MI;CAD;STENT-08/2011 Cardiac Risk Factors: Family History - CAD, History of Smoking, Hypertension, LBBB, Lipids and Obesity  Symptoms:  Chest Pain, Dizziness, DOE, Fatigue, Palpitations and SOB   Nuclear Pre-Procedure Caffeine/Decaff Intake:  7:30pm NPO After: 5:30am   IV Site: R Antecubital  IV 0.9% NS with Angio Cath:  22g  Chest Size (in):  46" IV Started by: Emmit Pomfret, RN  Height: 5\' 8"  (1.727 m)  Cup Size: n/a  BMI:  Body mass index is 33 kg/(m^2). Weight:  217 lb (98.431 kg)   Tech Comments:  N/A    Nuclear Med Study 1 or 2 day study: 1 day  Stress Test Type:  Lexiscan  Order Authorizing Provider:  Nicki Guadalajara, MD   Resting Radionuclide: Technetium 56m Sestamibi  Resting Radionuclide Dose: 10.3 mCi   Stress Radionuclide:  Technetium 25m Sestamibi  Stress Radionuclide Dose: 30.4 mCi           Stress Protocol Rest HR: 60 Stress HR: 82  Rest BP:130/76 Stress BP:141/68  Exercise Time (min): n/a METS: n/a          Dose of Adenosine (mg):  n/a Dose of Lexiscan: 0.4 mg  Dose of Atropine (mg): n/a Dose of Dobutamine: n/a mcg/kg/min (at max HR)  Stress Test Technologist: Ernestene Mention, CCT Nuclear Technologist: Gonzella Lex, CNMT   Rest Procedure:  Myocardial perfusion imaging was performed at rest 45 minutes following the intravenous administration of Technetium 74m Sestamibi. Stress Procedure:  The patient received IV Lexiscan 0.4 mg over 15-seconds.  Technetium 67m Sestamibi injected at 30-seconds.  There were no significant changes with Lexiscan.  Quantitative spect images were obtained after  a 45 minute delay.  Transient Ischemic Dilatation (Normal <1.22):  1.01 Lung/Heart Ratio (Normal <0.45):  0.35 QGS EDV:  113 ml QGS ESV:  59 ml LV Ejection Fraction: 48%  Signed by   Gonzella Lex, CNMT  PHYSICIAN INTERPRETATION  Rest ECG: NSR-LBBB  Stress ECG: No significant ST segment change suggestive of ischemia. and Uninteretable due to baseline LBBB  QPS Raw Data Images:  Mild diaphragmatic attenuation.  Normal left ventricular size.  There is interference from nuclear activity from structures below the diaphragm. This does not affect the ability to read the study. Stress Images:  There is decreased uptake in the apical-anteroseptal apical wall. There is decreased uptake in the inferior wall.  Rest Images:  There is decreased uptake in the apex.  There is decreased uptake in the inferior wall.  Comparison with the stress images reveals no significant change. Subtraction (SDS):  No reversibility is appreciated.  There is a fixed apical defect that is most consistent with a previous infarction or LBBB related defect.  There is a fixed inferior defect that is most consistent with diaphragmatic attenuation.  No evidence of ischemia.  Impression Exercise Capacity:  Lexiscan with no exercise. BP Response:  Normal blood pressure response. Clinical Symptoms:  There is dyspnea. ECG Impression:  Baseline:  LBBB.  EKG uninterpretable due to LBBB at rest and stress. Comparison with Prior Nuclear Study: No significant change from previous study  Overall Impression:  Low risk stress nuclear study.  LV Wall Motion: Low-Normal Wall Motion with mild apical hypokinesis as well as septal wall motion consistent with LBBB.     Marykay Lex, MD  07/28/2012 9:02 PM

## 2012-07-26 NOTE — Progress Notes (Signed)
[facility]   2D echo completed 07/26/2012.   Cindy Ahnna Dungan, RDCS  

## 2012-08-19 ENCOUNTER — Other Ambulatory Visit: Payer: Self-pay | Admitting: Endocrinology

## 2012-08-19 DIAGNOSIS — M549 Dorsalgia, unspecified: Secondary | ICD-10-CM

## 2012-08-27 ENCOUNTER — Ambulatory Visit
Admission: RE | Admit: 2012-08-27 | Discharge: 2012-08-27 | Disposition: A | Discharge status: Home / Self Care | Payer: BC Managed Care – PPO | Source: Ambulatory Visit | Attending: Endocrinology | Admitting: Endocrinology

## 2012-08-27 DIAGNOSIS — M549 Dorsalgia, unspecified: Secondary | ICD-10-CM

## 2012-08-27 MED ORDER — GADOBENATE DIMEGLUMINE 529 MG/ML IV SOLN
20.0000 mL | Freq: Once | INTRAVENOUS | Status: AC | PRN
  Administered 2012-08-27: 20 mL via INTRAVENOUS

## 2012-08-29 ENCOUNTER — Other Ambulatory Visit (HOSPITAL_COMMUNITY): Payer: Self-pay | Admitting: Cardiovascular Disease

## 2012-08-29 ENCOUNTER — Ambulatory Visit (HOSPITAL_COMMUNITY)
Admission: RE | Admit: 2012-08-29 | Discharge: 2012-08-29 | Disposition: A | Discharge status: Home / Self Care | Payer: BC Managed Care – PPO | Source: Ambulatory Visit | Attending: Cardiovascular Disease | Admitting: Cardiovascular Disease

## 2012-08-29 DIAGNOSIS — I714 Abdominal aortic aneurysm, without rupture, unspecified: Secondary | ICD-10-CM

## 2012-08-29 NOTE — Progress Notes (Signed)
Aorta Duplex Completed. Ambers Iyengar D  

## 2012-09-09 ENCOUNTER — Encounter: Payer: Self-pay | Admitting: Pulmonary Disease

## 2012-09-09 ENCOUNTER — Ambulatory Visit (INDEPENDENT_AMBULATORY_CARE_PROVIDER_SITE_OTHER): Payer: BC Managed Care – PPO | Admitting: Pulmonary Disease

## 2012-09-09 VITALS — BP 118/76 | HR 58 | Temp 97.8°F | Ht 68.0 in | Wt 219.0 lb

## 2012-09-09 DIAGNOSIS — R0600 Dyspnea, unspecified: Secondary | ICD-10-CM | POA: Insufficient documentation

## 2012-09-09 DIAGNOSIS — R0609 Other forms of dyspnea: Secondary | ICD-10-CM

## 2012-09-09 NOTE — Progress Notes (Signed)
  Subjective:    Patient ID: Matthew Evans, male    DOB: [birth year], 62 y.o.   MRN: [id number]  HPI The patient is a 62 year old male who I've been asked to see for dyspnea on exertion.  He has a long history of smoking, but has not done so for many years.  He gives a six-month history of mildly progressive dyspnea on exertion, and he has had spirometry last September that appears normal.  He has also had a chest x-ray that is unremarkable.  The patient has known coronary disease, and recently had an echocardiogram and stress test with his cardiologist, and was told they were "okay" the patient has been on Spiriva for possible COPD for the last one year, and sees no difference in his breathing.  He describes dyspnea with taking trash cans into the street, but there is a Hill involved.  He can also get mildly winded walking up a flight of stairs, and sometimes bringing groceries in from the car.  However, he can walk a mile a day, and has been doing so every day recently.   Review of Systems  Constitutional: Negative for fever and unexpected weight change.  HENT: Negative for ear pain, nosebleeds, congestion, sore throat, rhinorrhea, sneezing, trouble swallowing, dental problem, postnasal drip and sinus pressure.   Eyes: Negative for redness and itching.  Respiratory: Positive for cough and shortness of breath. Negative for chest tightness and wheezing.   Cardiovascular: Positive for chest pain and leg swelling ( feet swelling). Negative for palpitations.  Gastrointestinal: Negative for nausea and vomiting.       Acid heartburn//indigestion  Genitourinary: Negative for dysuria.  Musculoskeletal: Positive for joint swelling and arthralgias.  Skin: Negative for rash.  Neurological: Negative for headaches.  Hematological: Does not bruise/bleed easily.  Psychiatric/Behavioral: Negative for dysphoric mood. The patient is not nervous/anxious.        Objective:   Physical Exam Constitutional:   Overweight male, no acute distress  HENT:  Nares patent without discharge  Oropharynx without exudate, palate and uvula are normal  Eyes:  Perrla, eomi, no scleral icterus  Neck:  No JVD, no TMG  Cardiovascular:  Normal rate, regular rhythm, no rubs or gallops.  No murmurs        Intact distal pulses  Pulmonary :  Normal breath sounds, no stridor or respiratory distress   No rales, rhonchi, or wheezing  Abdominal:  Soft, nondistended, bowel sounds present.  No tenderness noted.   Musculoskeletal:  Mild ankle edema noted.  Lymph Nodes:  No cervical lymphadenopathy noted  Skin:  No cyanosis noted  Neurologic:  Alert, appropriate, moves all 4 extremities without obvious deficit.         Assessment & Plan:

## 2012-09-09 NOTE — Assessment & Plan Note (Signed)
The patient has mildly progressive dyspnea on exertion over the last 6 months, and I suspect that it is multifactorial.  He has a long history of smoking, but his spirometry on Spiriva in September of last year appears normal.  That would make COPD very unlikely, however I would like to schedule for full pulmonary function studies for better evaluation he does have a history of coronary disease, but has had a recent workup by his cardiologist and tells me that it was okay.  I will need to get these studies for review.  Finally, I suspect he does have an element associated with his weight and conditioning.  I will see him back after his pulmonary function studies.

## 2012-09-09 NOTE — Patient Instructions (Addendum)
Will schedule you for full breathing studies Will get your cxr report from 07/2012 from Dr. Marylen Ponto office Will arrange followup once your breathing studies are complete.

## 2012-09-10 ENCOUNTER — Encounter: Payer: Self-pay | Admitting: *Deleted

## 2012-09-10 ENCOUNTER — Ambulatory Visit (INDEPENDENT_AMBULATORY_CARE_PROVIDER_SITE_OTHER): Payer: BC Managed Care – PPO | Admitting: Pulmonary Disease

## 2012-09-10 ENCOUNTER — Telehealth: Payer: Self-pay | Admitting: Pulmonary Disease

## 2012-09-10 DIAGNOSIS — R0609 Other forms of dyspnea: Secondary | ICD-10-CM

## 2012-09-10 LAB — PULMONARY FUNCTION TEST

## 2012-09-10 NOTE — Telephone Encounter (Signed)
Matthew Evans with [facility] is going to look into finding these CXR reports from 07/2012--she states that the last report they have is from 02/2012. She is going to check further with the radiology dept and fax something letting us know if these have been located.  They do not have the Nuclear CPST or ECHO reports from [facility]--will call [facility] and have these faxed to our office.

## 2012-09-10 NOTE — Progress Notes (Signed)
PFT done today. 

## 2012-09-10 NOTE — Telephone Encounter (Signed)
ERROR

## 2012-09-11 NOTE — Telephone Encounter (Signed)
CPST, ECHO ( [facility]) and cxr [facility]) received--- in Ryk[facility] Coviello folder for your review.

## 2012-09-16 ENCOUNTER — Telehealth: Payer: Self-pay | Admitting: Pulmonary Disease

## 2012-09-16 NOTE — Telephone Encounter (Signed)
Pt needs ov to review pfts.

## 2012-09-17 NOTE — Telephone Encounter (Signed)
Patient scheduled for f/u appt to review PFT 10/01/12 at 415.

## 2012-09-28 ENCOUNTER — Other Ambulatory Visit: Payer: Self-pay | Admitting: Neurology

## 2012-09-29 ENCOUNTER — Encounter: Payer: Self-pay | Admitting: Pulmonary Disease

## 2012-10-01 ENCOUNTER — Ambulatory Visit (INDEPENDENT_AMBULATORY_CARE_PROVIDER_SITE_OTHER): Payer: BC Managed Care – PPO | Admitting: Pulmonary Disease

## 2012-10-01 ENCOUNTER — Encounter: Payer: Self-pay | Admitting: Pulmonary Disease

## 2012-10-01 VITALS — BP 122/72 | HR 58 | Temp 96.7°F | Ht 68.0 in | Wt 217.8 lb

## 2012-10-01 DIAGNOSIS — R0609 Other forms of dyspnea: Secondary | ICD-10-CM

## 2012-10-01 NOTE — Assessment & Plan Note (Signed)
The patient has mild airflow obstruction by his PFTs today, with a significant bronchodilator response.  He also has air trapping noted on lung volumes.  He has a normal diffusion capacity, and that would raise the question whether his airflow obstruction is due more to asthma than emphysema.  I have told the patient this may or may not be the reason for his dyspnea on exertion, but we will try him on a different bronchodilator medication to see if he sees any difference.  I have also encouraged him to work aggressively on weight loss and conditioning.

## 2012-10-01 NOTE — Patient Instructions (Addendum)
You have mild airflow obstruction that I suspect is due to emphysema, and possibly a component of asthma. Stop spiriva Start dulera 100/5  2 inhalations every am and pm no matter how you feel.  Rinse mouth out well after using.  Please call me in 4 weeks with your response to the medication change.  Will arrange followup after that.

## 2012-10-01 NOTE — Progress Notes (Signed)
  Subjective:    Patient ID: Matthew Evans, male    DOB: 1951-01-07, 62 y.o.   MRN: [id number]  HPI The patient comes in today for followup after his recent pulmonary function studies.  These were done as part of workup for dyspnea on exertion.  He has had a cardiac evaluation that was unremarkable, and a recent chest x-ray that was clear.  His pulmonary function studies showed mild airflow obstruction, and he did have a 15% improvement in FEV1 with bronchodilators.  There was definite air-trapping noted, but no restriction.  His diffusion capacity was normal.  I have reviewed the study with him in detail, and answered all of his questions.   Review of Systems  Constitutional: Negative for fever and unexpected weight change.  HENT: Negative for ear pain, nosebleeds, congestion, sore throat, rhinorrhea, sneezing, trouble swallowing, dental problem, postnasal drip and sinus pressure.   Eyes: Negative for redness and itching.  Respiratory: Positive for cough, shortness of breath and wheezing. Negative for chest tightness.   Cardiovascular: Positive for palpitations and leg swelling (feet).  Gastrointestinal: Negative for nausea and vomiting.  Genitourinary: Negative for dysuria.  Musculoskeletal: Negative for joint swelling.  Skin: Positive for rash ( lupus//dermacytitis).  Neurological: Negative for headaches.  Hematological: Does not bruise/bleed easily.  Psychiatric/Behavioral: Negative for dysphoric mood. The patient is not nervous/anxious.        Objective:   Physical Exam Overweight male in no acute distress Nose without purulence or discharge noted Neck without lymphadenopathy or thyromegaly Chest is clear to auscultation Lower extremities without edema, cyanosis Alert and oriented, moves all 4 extremities.        Assessment & Plan:

## 2012-10-14 ENCOUNTER — Encounter: Payer: Self-pay | Admitting: Pulmonary Disease

## 2012-10-25 ENCOUNTER — Other Ambulatory Visit: Payer: Self-pay | Admitting: *Deleted

## 2012-10-25 MED ORDER — PANTOPRAZOLE SODIUM 40 MG PO TBEC: 40.0000 mg | DELAYED_RELEASE_TABLET | Freq: Every day | ORAL | Status: DC

## 2012-10-25 MED ORDER — CLOPIDOGREL BISULFATE 75 MG PO TABS: 75.0000 mg | ORAL_TABLET | Freq: Every day | ORAL | Status: DC

## 2012-10-25 NOTE — Telephone Encounter (Signed)
Refills sent to pharmacy. 

## 2012-10-29 ENCOUNTER — Encounter: Payer: Self-pay | Admitting: Cardiovascular Disease

## 2012-10-29 ENCOUNTER — Encounter: Payer: Self-pay | Admitting: *Deleted

## 2012-10-29 ENCOUNTER — Ambulatory Visit (INDEPENDENT_AMBULATORY_CARE_PROVIDER_SITE_OTHER): Payer: BC Managed Care – PPO | Admitting: Cardiovascular Disease

## 2012-10-29 VITALS — BP 110/70 | HR 60 | Ht 68.0 in | Wt 214.0 lb

## 2012-10-29 DIAGNOSIS — I714 Abdominal aortic aneurysm, without rupture, unspecified: Secondary | ICD-10-CM

## 2012-10-29 DIAGNOSIS — I25810 Atherosclerosis of coronary artery bypass graft(s) without angina pectoris: Secondary | ICD-10-CM

## 2012-10-29 DIAGNOSIS — E782 Mixed hyperlipidemia: Secondary | ICD-10-CM

## 2012-10-29 DIAGNOSIS — I10 Essential (primary) hypertension: Secondary | ICD-10-CM

## 2012-10-29 DIAGNOSIS — J449 Chronic obstructive pulmonary disease, unspecified: Secondary | ICD-10-CM

## 2012-10-29 DIAGNOSIS — I4949 Other premature depolarization: Secondary | ICD-10-CM

## 2012-10-29 DIAGNOSIS — E785 Hyperlipidemia, unspecified: Secondary | ICD-10-CM | POA: Insufficient documentation

## 2012-10-29 DIAGNOSIS — I493 Ventricular premature depolarization: Secondary | ICD-10-CM | POA: Insufficient documentation

## 2012-10-29 DIAGNOSIS — I119 Hypertensive heart disease without heart failure: Secondary | ICD-10-CM

## 2012-10-29 DIAGNOSIS — E039 Hypothyroidism, unspecified: Secondary | ICD-10-CM

## 2012-10-29 DIAGNOSIS — I2510 Atherosclerotic heart disease of native coronary artery without angina pectoris: Secondary | ICD-10-CM

## 2012-10-29 MED ORDER — METOPROLOL SUCCINATE ER 25 MG PO TB24: 25.0000 mg | ORAL_TABLET | Freq: Every day | ORAL | Status: DC

## 2012-10-29 NOTE — Patient Instructions (Addendum)
Your physician recommends that you schedule a follow-up appointment on June 10th with wife.  Your physician has recommended you make the following change in your medication: METOPROLOL ER 25 MG was added to your regimen. You are to start with 1/2 tablet daily.

## 2012-10-29 NOTE — Progress Notes (Signed)
Patient ID: Matthew Evans, male   DOB: [birth year], 62 y.o.   MRN: [id number]  HPI: Matthew Evans, is a 62 y.o. male who presents to the office today for cardiology followup evaluation per her denies recent chest pain. He is unaware of palpitations he presents in followup of his Doppler study. Matthew Evans has established coronary artery disease in April 2009 suffered an ST segment elevation inferior MI and underwent RCA stenting. He underwent staged cutting balloon to a high grade LAD stenosis which involved multiple branches and stenting was not done to reduce potential for stent jailing of his multiple branches. In August 2010 he was found to have 56% stenosis beyond his RCA stent in the LAD intervention site remained patent. Has a documented abdominal aortic aneurysm and recently on 08/29/2012 underwent a one-year followup evaluation with Doppler imaging. This was essentially unchanged from one year previously and showed a measurement of 3.4x3.7 cm. RF additional problems include COPD, hypertension, hypothyroidism, hyperlipidemia, GERD. His last nuclear study in February 2014 remained low risk and did show mild apical hypokinesis with low normal wall motion without ischemia appear an echo Doppler study in February 2014 showed mild LVH with grade 1 diastolic dysfunction and normal systolic function. He did have aortic valve thickening without stenosis and mild left atrial dilatation. Had normal pulmonary pressures. Matthew Evans is unaware of any palpitations or he denies recurrent episodes of chest pain. He denies presyncope or syncope. He presents today for followup of his Doppler study.  Past Medical History  Diagnosis Date  . Myocardial infarction 09/2007    PCI to RCA  . Carotid artery occlusion   . AAA (abdominal aortic aneurysm)     mild; doppler 08/29/12-3.4x3.7  . Thyroid disease     hypothyroidism  . Emphysema   . Mini stroke   . Sleep apnea     sleep study 12/14/06 ahi 16.89, during rem 12.3, supine ahi  57.49; cpap 02/22/07-c-flex of 3 at 13cm h2o  . Lupus   . Memory loss     d/t mini stroke  . CAD (coronary artery disease)     echo 07/26/12-EF 55-60%; myoview 07/26/12-low normal wall motion with mild apical hypokinesis as well a septal wall motion consistent with LBBB  . HTN (hypertension)   . Hyperlipidemia   . GERD (gastroesophageal reflux disease)     Past Surgical History  Procedure Laterality Date  . Coronary stent placement  09/2007    right  . Balloon atherotomy      LAD  . Cardiac catheterization  08/11/11    EF50-55%, small AAA, no signifiant stent restenosis in RCA , no restenosis of PTCA of LAD, medical therapy  . Cardiac catheterization  09/27/06    cutting balloon arthrotomy of LAD stenosis with dilatation of a 3.5x68mm cutting balloon  . Coronary angioplasty with stent placement  09/24/06    PTCA/stent of RCA with a 3.5x4mm Taxus DES post dilated to 3.43mm  . Cardiac catheterization  01/07/09    50-60% progressive dz beyond the stented segment in the RCA , LAD intervention remained patent    Allergies  Allergen Reactions  . Latex   . Penicillins   . Septra (Sulfamethoxazole W-Trimethoprim)   . Zocor (Simvastatin)   . Niaspan (Niacin) Rash  . Simcor (Niacin-Simvastatin Er) Rash    Current Outpatient Prescriptions  Medication Sig Dispense Refill  . amLODipine (NORVASC) 5 MG tablet Take 1 tablet by mouth daily.      Marland Kitchen aspirin EC 81 MG  tablet Take 81 mg by mouth daily.        . clopidogrel (PLAVIX) 75 MG tablet Take 1 tablet (75 mg total) by mouth daily.  30 tablet  11  . fish oil-omega-3 fatty acids 1000 MG capsule Take 1,200 mg by mouth daily.      Marland Kitchen gabapentin (NEURONTIN) 100 MG capsule TAKE ONE CAPSULE BY MOUTH 3 TIMES A DAY  90 capsule  6  . hydrochlorothiazide (MICROZIDE) 12.5 MG capsule Take 1 capsule by mouth every other day.      . hydroxychloroquine (PLAQUENIL) 200 MG tablet Take 2 tablets by mouth daily.      . isosorbide mononitrate (IMDUR) 60 MG 24 hr  tablet Take 60 mg by mouth daily.        Marland Kitchen levothyroxine (SYNTHROID, LEVOTHROID) 150 MCG tablet Take 150 mcg by mouth daily.        Marland Kitchen losartan (COZAAR) 50 MG tablet Take 50 mg by mouth daily.      . mometasone-formoterol (DULERA) 200-5 MCG/ACT AERO Inhale 2 puffs into the lungs daily.      . pantoprazole (PROTONIX) 40 MG tablet Take 1 tablet (40 mg total) by mouth daily.  30 tablet  11  . tamsulosin (FLOMAX) 0.4 MG CAPS Take 0.4 mg by mouth daily.      . traMADol (ULTRAM) 50 MG tablet Take 50 mg by mouth every 6 (six) hours as needed for pain.      . metoprolol succinate (TOPROL XL) 25 MG 24 hr tablet Take 1 tablet (25 mg total) by mouth daily. Take 1/2 to 1 tablet  Daily.  30 tablet  6   No current facility-administered medications for this visit.    Socially he is married has 3 children 19 grandchildren. There is a remote tobacco history. He has tried to limit his sodium intake.  ROS is negative for fever chills night sweats. He denies rash. He denies wheezing he is unaware of palpitations. He denies bleeding. He denies abdominal pain. He denies change in bowel habits the does not some occasional low back discomfort and transient intermittent paresthesias of his leg. He denies significant swelling. Other system review is negative.  PE BP 110/70  Pulse 60  Ht 5\' 8"  (1.727 m)  Wt 214 lb (97.07 kg)  BMI 32.55 kg/m2  General: Alert, oriented, no distress HEENT: Normocephalic, atraumatic. Pupils round and reactive; sclera anicteric;  Nose without nasal septal hypertrophy Mouth/Parynx benign; Mallinpatti scale 2/3 Neck: No JVD, no carotid briuts Lungs: decreased breath sounds; no wheezing or rales Heart: RRR, s1 s2 normal 1/6 sem Abdomen: soft, nontender; no hepatosplenomehaly, BS+; abdominal aorta nontender and not dilated by palpation.  Moderate central adiposity Pulses 2+ Extremities: no clubbinbg cyanosis or edema, Homan's sign negative  Neurologic: grossly nonfocal  ECG: Sinus  rhythm with ventricular bigeminy left bundle-branch block   LABS:  BMET    Component Value Date/Time   NA 136 03/31/2011 1700   K 4.0 03/31/2011 1700   CL 99 03/31/2011 1700   CO2 26 03/31/2011 1700   GLUCOSE 95 03/31/2011 1700   BUN 22 03/31/2011 1700   CREATININE 1.40* 03/31/2011 1700   CALCIUM 9.3 03/31/2011 1700   GFRNONAA 53* 03/31/2011 1700   GFRAA 62* 03/31/2011 1700     Hepatic Function Panel     Component Value Date/Time   PROT 7.4 03/31/2011 1700   ALBUMIN 3.6 03/31/2011 1700   AST 27 03/31/2011 1700   ALT 22 03/31/2011 1700   ALKPHOS  97 03/31/2011 1700   BILITOT 0.8 03/31/2011 1700     CBC    Component Value Date/Time   WBC 6.1 03/31/2011 1700   RBC 4.85 03/31/2011 1700   HGB 14.2 03/31/2011 1700   HCT 41.5 03/31/2011 1700   PLT 198 03/31/2011 1700   MCV 85.6 03/31/2011 1700   MCH 29.3 03/31/2011 1700   MCHC 34.2 03/31/2011 1700   RDW 12.8 03/31/2011 1700   LYMPHSABS 1.2 03/31/2011 1700   MONOABS 1.0 03/31/2011 1700   EOSABS 0.1 03/31/2011 1700   BASOSABS 0.1 03/31/2011 1700     BNP No results found for this basename: probnp    Lipid Panel  No results found for this basename: chol, trig, hdl, cholhdl, vldl, ldlcalc     RADIOLOGY: No results found.    ASSESSMENT AND PLAN: A cardiac standpoint, Matthew Evans remains chest pain-free 5 years status post his inferior wall MI treated with stenting of his right coronary artery and staged intervention to his LAD vessel. He has been on medical therapy for concomitant coronary disease. His abdominal ultrasound does not show significant change in his abdominal aortic aneurysm from one year ago. Several years ago, he was on the systolic at low dose but this was stopped secondary to bradycardia. In light of his ventricular ectopy with bigeminal rhythm I am suggesting reinitiation of very low-dose beta blocker will start him initially with metoprolol succinate 12.5 mg daily. We will obtain laboratory  tomorrow which he had not gotten prior to this visit including a CBC, comprehensive metabolic panel, magnesium level, TSH, lipid panel and thyroid function studies. I will see him in several weeks for followup evaluation and medication adjustment may be necessary at that time.     Lennette Bihari, MD, [facility]  10/29/2012 4:55 PM

## 2012-10-30 LAB — COMPREHENSIVE METABOLIC PANEL
ALT: 12 U/L (ref 0–53)
AST: 16 U/L (ref 0–37)
BUN: 15 mg/dL (ref 6–23)
CO2: 28 mEq/L (ref 19–32)
Calcium: 8.6 mg/dL (ref 8.4–10.5)
Chloride: 106 mEq/L (ref 96–112)
Creat: 1.2 mg/dL (ref 0.50–1.35)
Total Bilirubin: 0.5 mg/dL (ref 0.3–1.2)

## 2012-10-30 LAB — CBC
HCT: 40.8 % (ref 39.0–52.0)
Hemoglobin: 13.8 g/dL (ref 13.0–17.0)
MCHC: 33.8 g/dL (ref 30.0–36.0)
WBC: 5.2 10*3/uL (ref 4.0–10.5)

## 2012-10-30 LAB — LIPID PANEL
Cholesterol: 114 mg/dL (ref 0–200)
HDL: 34 mg/dL — ABNORMAL LOW (ref >39)
LDL Cholesterol: 65 mg/dL (ref 0–99)
Triglycerides: 74 mg/dL (ref <150)
VLDL: 15 mg/dL (ref 0–40)

## 2012-10-30 LAB — TSH: TSH: 5.349 u[IU]/mL — ABNORMAL HIGH (ref 0.350–4.500)

## 2012-10-31 ENCOUNTER — Telehealth: Payer: Self-pay | Admitting: Cardiovascular Disease

## 2012-10-31 LAB — COMPREHENSIVE METABOLIC PANEL
AST: 15 U/L (ref 0–37)
Albumin: 3.8 g/dL (ref 3.5–5.2)
Alkaline Phosphatase: 79 U/L (ref 39–117)
Calcium: 8.6 mg/dL (ref 8.4–10.5)
Chloride: 106 mEq/L (ref 96–112)
Potassium: 4.1 mEq/L (ref 3.5–5.3)
Sodium: 140 mEq/L (ref 135–145)
Total Protein: 6 g/dL (ref 6.0–8.3)

## 2012-10-31 LAB — LIPID PANEL
Cholesterol: 114 mg/dL (ref 0–200)
HDL: 36 mg/dL — ABNORMAL LOW (ref >39)
Total CHOL/HDL Ratio: 3.2 Ratio
Triglycerides: 73 mg/dL (ref <150)
VLDL: 15 mg/dL (ref 0–40)

## 2012-10-31 NOTE — Telephone Encounter (Signed)
[facility]) is calling because they verification on what labs Dr. Tresa Endo wants ordered on Matthew Evans. Please call back with information  Using this reference #[id number]   Thanks

## 2012-10-31 NOTE — Telephone Encounter (Signed)
Returned call.  Informed [facility] did not receive the complete lab order.  Wanted to confirm what labs were ordered.  Informed OV note and labs ordered in EPIC.  Verbalized understanding.  Stated they cannot add on a CBC since they did not collect a lavender tube.  Stated they will contact pt to return for CBC.  Message forwarded to Dr. Pierre Bali, CMA [facility]).

## 2012-11-12 ENCOUNTER — Ambulatory Visit (INDEPENDENT_AMBULATORY_CARE_PROVIDER_SITE_OTHER): Payer: BC Managed Care – PPO | Admitting: Cardiovascular Disease

## 2012-11-12 VITALS — BP 118/80 | HR 58 | Ht 68.0 in | Wt 211.0 lb

## 2012-11-12 DIAGNOSIS — I714 Abdominal aortic aneurysm, without rupture, unspecified: Secondary | ICD-10-CM

## 2012-11-12 DIAGNOSIS — I493 Ventricular premature depolarization: Secondary | ICD-10-CM

## 2012-11-12 DIAGNOSIS — I2510 Atherosclerotic heart disease of native coronary artery without angina pectoris: Secondary | ICD-10-CM

## 2012-11-12 DIAGNOSIS — I4949 Other premature depolarization: Secondary | ICD-10-CM

## 2012-11-12 DIAGNOSIS — E785 Hyperlipidemia, unspecified: Secondary | ICD-10-CM

## 2012-11-12 NOTE — Patient Instructions (Signed)
Your physician recommends that you schedule a follow-up appointment in: 6 months.   NO CHANGES HAS BEEN MADE

## 2012-11-27 ENCOUNTER — Encounter: Payer: Self-pay | Admitting: Cardiovascular Disease

## 2012-11-27 NOTE — Progress Notes (Signed)
Patient ID: Matthew Evans, male   DOB: 03/14/1951, 62 y.o.   MRN: [id number]  HPI: Matthew Evans, is a 62 y.o. male who presents to the office today for cardiology followup evaluation. I last saw him on 10/29/2012 at which time he had ventricular ectopy with a bigeminal rhythm. He  unaware of palpitations.  Matthew Evans has established coronary artery disease.  In April 2009 suffered an ST segment elevation inferior MI and underwent RCA stenting. He underwent staged cutting balloon to a high grade LAD stenosis which involved multiple branches and stenting was not done to reduce potential for stent jailing of his multiple branches. In August 2010 he was found to have 56% stenosis beyond his RCA stent in the LAD intervention site remained patent. Has a documented abdominal aortic aneurysm and recently on 08/29/2012 underwent a one-year followup evaluation with Doppler imaging. This was essentially unchanged from one year previously and showed a measurement of 3.4x3.7 cm. RF additional problems include COPD, hypertension, hypothyroidism, hyperlipidemia, GERD. His last nuclear study in February 2014 remained low risk and did show mild apical hypokinesis with low normal wall motion without ischemia. An echo Doppler study in February 2014 showed mild LVH with grade 1 diastolic dysfunction and normal systolic function. He did have aortic valve thickening without stenosis and mild left atrial dilatation. Had normal pulmonary pressures.  I saw Matthew. Matthew Evans, I initiated metoprolol succinate 12.5 mg daily. He denies any symptoms. He's been unaware of any palpitations. He presents for evaluation.  Laboratory revealed a normal hemoglobin and hematocrit as well as comprehensive metabolic panel his total cholesterol is 114 triglyceride 74 LDL 65 HDL 44. TSH was 5.349.   Past Medical History  Diagnosis Date  . Myocardial infarction 09/2007    PCI to RCA  . Carotid artery occlusion   . AAA (abdominal aortic aneurysm)    mild; doppler 08/29/12-3.4x3.7  . Thyroid disease     hypothyroidism  . Emphysema   . Mini stroke   . Sleep apnea     sleep study 12/14/06 ahi 16.89, during rem 12.3, supine ahi 57.49; cpap 02/22/07-c-flex of 3 at 13cm h2o  . Lupus   . Memory loss     d/t mini stroke  . CAD (coronary artery disease)     echo 07/26/12-EF 55-60%; myoview 07/26/12-low normal wall motion with mild apical hypokinesis as well a septal wall motion consistent with LBBB  . HTN (hypertension)   . Hyperlipidemia   . GERD (gastroesophageal reflux disease)     Past Surgical History  Procedure Laterality Date  . Coronary stent placement  09/2007    right  . Balloon atherotomy      LAD  . Cardiac catheterization  08/11/11    EF50-55%, small AAA, no signifiant stent restenosis in RCA , no restenosis of PTCA of LAD, medical therapy  . Cardiac catheterization  09/27/06    cutting balloon arthrotomy of LAD stenosis with dilatation of a 3.5x94mm cutting balloon  . Coronary angioplasty with stent placement  09/24/06    PTCA/stent of RCA with a 3.5x43mm Taxus DES post dilated to 3.66mm  . Cardiac catheterization  01/07/09    50-60% progressive dz beyond the stented segment in the RCA , LAD intervention remained patent    Allergies  Allergen Reactions  . Latex   . Penicillins   . Septra (Sulfamethoxazole W-Trimethoprim)   . Zocor (Simvastatin)   . Niaspan (Niacin) Rash  . Simcor (Niacin-Simvastatin Er) Rash  Current Outpatient Prescriptions  Medication Sig Dispense Refill  . amLODipine (NORVASC) 5 MG tablet Take 1 tablet by mouth daily.      Marland Kitchen aspirin EC 81 MG tablet Take 81 mg by mouth daily.        . clopidogrel (PLAVIX) 75 MG tablet Take 1 tablet (75 mg total) by mouth daily.  30 tablet  11  . fish oil-omega-3 fatty acids 1000 MG capsule Take 1,200 mg by mouth daily.      Marland Kitchen gabapentin (NEURONTIN) 100 MG capsule TAKE ONE CAPSULE BY MOUTH 3 TIMES A DAY  90 capsule  6  . hydrochlorothiazide (MICROZIDE) 12.5 MG  capsule Take 1 capsule by mouth every other day.      . hydroxychloroquine (PLAQUENIL) 200 MG tablet Take 2 tablets by mouth daily.      . isosorbide mononitrate (IMDUR) 60 MG 24 hr tablet Take 60 mg by mouth daily.        Marland Kitchen levothyroxine (SYNTHROID, LEVOTHROID) 150 MCG tablet Take 150 mcg by mouth daily.        Marland Kitchen losartan (COZAAR) 50 MG tablet Take 50 mg by mouth daily.      . metoprolol succinate (TOPROL XL) 25 MG 24 hr tablet Take 1 tablet (25 mg total) by mouth daily. Take 1/2 to 1 tablet  Daily.  30 tablet  6  . mometasone-formoterol (DULERA) 200-5 MCG/ACT AERO Inhale 2 puffs into the lungs daily.      . pantoprazole (PROTONIX) 40 MG tablet Take 1 tablet (40 mg total) by mouth daily.  30 tablet  11  . tamsulosin (FLOMAX) 0.4 MG CAPS Take 0.4 mg by mouth daily.      . traMADol (ULTRAM) 50 MG tablet Take 50 mg by mouth every 6 (six) hours as needed for pain.       No current facility-administered medications for this visit.    Socially he is married has 3 children. There is a remote tobacco history. He has tried to limit his sodium intake.  ROS is negative for fever chills night sweats. He denies rash. He denies wheezing he is unaware of palpitations. He denies bleeding. He denies abdominal pain. He denies change in bowel habits the does not some occasional low back discomfort and transient intermittent paresthesias of his leg. He denies significant swelling. Other system review is negative.  PE BP 118/80  Pulse 58  Ht 5\' 8"  (1.727 m)  Wt 211 lb (95.709 kg)  BMI 32.09 kg/m2  General: Alert, oriented, no distress HEENT: Normocephalic, atraumatic. Pupils round and reactive; sclera anicteric;  Nose without nasal septal hypertrophy Mouth/Parynx benign; Mallinpatti scale 2/3 Neck: No JVD, no carotid briuts Lungs: decreased breath sounds; no wheezing or rales Heart: RRR, s1 s2 normal 1/6 sem Abdomen: soft, nontender; no hepatosplenomehaly, BS+; abdominal aorta nontender and not dilated by  palpation.  Moderate central adiposity Pulses 2+ Extremities: no clubbinbg cyanosis or edema, Homan's sign negative  Neurologic: grossly nonfocal  ECG: Sinus rhythm at 58 beats per minute with mild left axis deviation and left bundle branch block. There is complete resolution of prior ectopy.   LABS:  BMET    Component Value Date/Time   NA 140 10/29/2012 1634   K 4.1 10/29/2012 1634   CL 106 10/29/2012 1634   CO2 27 10/29/2012 1634   GLUCOSE 91 10/29/2012 1634   BUN 16 10/29/2012 1634   CREATININE 1.24 10/29/2012 1634   CREATININE 1.40* 03/31/2011 1700   CALCIUM 8.6 10/29/2012 1634  GFRNONAA 53* 03/31/2011 1700   GFRAA 62* 03/31/2011 1700     Hepatic Function Panel     Component Value Date/Time   PROT 6.0 10/29/2012 1634   ALBUMIN 3.8 10/29/2012 1634   AST 15 10/29/2012 1634   ALT 11 10/29/2012 1634   ALKPHOS 79 10/29/2012 1634   BILITOT 0.5 10/29/2012 1634     CBC    Component Value Date/Time   WBC 5.2 10/29/2012 1617   RBC 5.06 10/29/2012 1617   HGB 13.8 10/29/2012 1617   HCT 40.8 10/29/2012 1617   PLT 255 10/29/2012 1617   MCV 80.6 10/29/2012 1617   MCH 27.3 10/29/2012 1617   MCHC 33.8 10/29/2012 1617   RDW 13.8 10/29/2012 1617   LYMPHSABS 1.2 03/31/2011 1700   MONOABS 1.0 03/31/2011 1700   EOSABS 0.1 03/31/2011 1700   BASOSABS 0.1 03/31/2011 1700     BNP No results found for this basename: probnp    Lipid Panel     Component Value Date/Time   CHOL 114 10/29/2012 1634     RADIOLOGY: No results found.    ASSESSMENT AND PLAN: Fro m a cardiac standpoint, Matthew. Oran remains chest pain-free 5 years status post his inferior wall MI treated with stenting of his right coronary artery and staged intervention to his LAD vessel. He has been on medical therapy for concomitant coronary disease. He has tolerated the addition of Toprol and has complete ectopy suppression his blood pressure is well controlled. He is not having anginal symptoms. I did review his blood work with  him in detail. As long as he remains stable I will see him in 6 months for followup evaluation.  Lennette Bihari, MD, [facility]  11/27/2012 9:36 PM

## 2012-12-03 ENCOUNTER — Other Ambulatory Visit: Payer: Self-pay

## 2012-12-03 MED ORDER — HYDROCHLOROTHIAZIDE 12.5 MG PO CAPS: 12.5000 mg | ORAL_CAPSULE | ORAL | Status: DC

## 2012-12-03 MED ORDER — PANTOPRAZOLE SODIUM 40 MG PO TBEC: 40.0000 mg | DELAYED_RELEASE_TABLET | Freq: Every day | ORAL | Status: DC

## 2012-12-03 MED ORDER — CLOPIDOGREL BISULFATE 75 MG PO TABS: 75.0000 mg | ORAL_TABLET | Freq: Every day | ORAL | Status: DC

## 2012-12-03 NOTE — Telephone Encounter (Signed)
Rx was sent to pharmacy electronically. 

## 2012-12-04 ENCOUNTER — Other Ambulatory Visit: Payer: Self-pay

## 2012-12-04 MED ORDER — AMLODIPINE BESYLATE 5 MG PO TABS: 5.0000 mg | ORAL_TABLET | Freq: Every day | ORAL | Status: DC

## 2012-12-04 MED ORDER — METOPROLOL SUCCINATE ER 25 MG PO TB24: ORAL_TABLET | ORAL | Status: DC

## 2012-12-04 MED ORDER — LOSARTAN POTASSIUM 50 MG PO TABS: 50.0000 mg | ORAL_TABLET | Freq: Every day | ORAL | Status: DC

## 2012-12-04 MED ORDER — ISOSORBIDE MONONITRATE ER 60 MG PO TB24: 60.0000 mg | ORAL_TABLET | Freq: Every day | ORAL | Status: DC

## 2012-12-04 NOTE — Telephone Encounter (Signed)
Rx was sent to pharmacy electronically. 

## 2012-12-05 ENCOUNTER — Other Ambulatory Visit: Payer: Self-pay | Admitting: Cardiovascular Disease

## 2012-12-23 ENCOUNTER — Telehealth: Payer: Self-pay | Admitting: *Deleted

## 2012-12-23 ENCOUNTER — Ambulatory Visit (INDEPENDENT_AMBULATORY_CARE_PROVIDER_SITE_OTHER): Payer: BC Managed Care – PPO | Admitting: Pulmonary Disease

## 2012-12-23 ENCOUNTER — Encounter: Payer: Self-pay | Admitting: Pulmonary Disease

## 2012-12-23 VITALS — BP 134/76 | HR 57 | Temp 96.8°F | Ht 69.75 in | Wt 213.4 lb

## 2012-12-23 DIAGNOSIS — J449 Chronic obstructive pulmonary disease, unspecified: Secondary | ICD-10-CM

## 2012-12-23 DIAGNOSIS — J438 Other emphysema: Secondary | ICD-10-CM

## 2012-12-23 DIAGNOSIS — R0609 Other forms of dyspnea: Secondary | ICD-10-CM

## 2012-12-23 DIAGNOSIS — R0989 Other specified symptoms and signs involving the circulatory and respiratory systems: Secondary | ICD-10-CM

## 2012-12-23 NOTE — Patient Instructions (Signed)
Stop Dulera 100/5 Try Anoro 1 inhalation each morning. Can still use Albuterol as needed.  Call me in 3 weeks with response. Keep in mind other things can cause shortness of breath as well.

## 2012-12-23 NOTE — Progress Notes (Signed)
  Subjective:    Patient ID: Matthew Evans, male    DOB: 04-11-1951, 62 y.o.   MRN: [id number]  HPI The pt comes in today for f/u of his doe and mild airflow obstruction on PFT"s.  He has been tried on dulera, and doesn't feel it has relieved his symptoms.  No cough or chest congestion noted.    Review of Systems  Constitutional: Negative for fever and unexpected weight change.  HENT: Negative for ear pain, nosebleeds, congestion, sore throat, rhinorrhea, sneezing, trouble swallowing, dental problem, postnasal drip and sinus pressure.   Eyes: Negative for redness and itching.  Respiratory: Positive for cough ( yellow mucus prod.), shortness of breath and wheezing. Negative for chest tightness.   Cardiovascular: Negative for palpitations and leg swelling.  Gastrointestinal: Negative for nausea and vomiting.  Genitourinary: Negative for dysuria.  Musculoskeletal: Negative for joint swelling.  Skin: Negative for rash.  Neurological: Negative for headaches.  Hematological: Does not bruise/bleed easily.  Psychiatric/Behavioral: Negative for dysphoric mood. The patient is not nervous/anxious.        Objective:   Physical Exam Wd male in nad Nose without purulence or d/c noted Neck without LN or TMG Chest with fairly clear bs, no wheezing Cor with rrr LE without significant edema, no cyanosis Alert and oriented, moves all 4.        Assessment & Plan:

## 2012-12-23 NOTE — Telephone Encounter (Signed)
Error

## 2013-01-24 ENCOUNTER — Encounter: Payer: Self-pay | Admitting: Pulmonary Disease

## 2013-01-24 ENCOUNTER — Telehealth: Payer: Self-pay | Admitting: Pulmonary Disease

## 2013-01-24 MED ORDER — UMECLIDINIUM-VILANTEROL 62.5-25 MCG/INH IN AEPB: 1.0000 | INHALATION_SPRAY | Freq: Every day | RESPIRATORY_TRACT | Status: DC

## 2013-01-24 NOTE — Telephone Encounter (Signed)
Pt called for 3 week follow up on medication. States that Anoro has worked well for him and he would like a Animator to [employer].  Anoro 1 puff qd #30 x 11  Dr Shelle Iron please advise if you will need to see this patient or if they are to f/u PRN. Thanks.

## 2013-01-24 NOTE — Telephone Encounter (Signed)
Ok to send in anoro, and would like to see in 6mos to check on how things are going.

## 2013-01-24 NOTE — Assessment & Plan Note (Signed)
The pt still has doe despite a trial of advair and dulera.  This may indicate that his mild obstructive disease has nothing to do with his symptoms, or maybe he just has been a nonresponder to LABA/ICS.  Would like to try him on anoro, but if no improvement, suspect there is another etiology to his issues.

## 2013-01-24 NOTE — Telephone Encounter (Signed)
Spouse aware and rx sent

## 2013-05-13 ENCOUNTER — Ambulatory Visit: Payer: BC Managed Care – PPO | Admitting: Cardiovascular Disease

## 2013-05-13 ENCOUNTER — Encounter: Payer: Self-pay | Admitting: Cardiovascular Disease

## 2013-05-13 ENCOUNTER — Ambulatory Visit (INDEPENDENT_AMBULATORY_CARE_PROVIDER_SITE_OTHER): Payer: BC Managed Care – PPO | Admitting: Cardiovascular Disease

## 2013-05-13 VITALS — BP 130/70 | HR 50 | Ht 68.0 in | Wt 217.9 lb

## 2013-05-13 DIAGNOSIS — I119 Hypertensive heart disease without heart failure: Secondary | ICD-10-CM

## 2013-05-13 DIAGNOSIS — R0989 Other specified symptoms and signs involving the circulatory and respiratory systems: Secondary | ICD-10-CM

## 2013-05-13 DIAGNOSIS — I2510 Atherosclerotic heart disease of native coronary artery without angina pectoris: Secondary | ICD-10-CM

## 2013-05-13 DIAGNOSIS — E039 Hypothyroidism, unspecified: Secondary | ICD-10-CM

## 2013-05-13 DIAGNOSIS — R0609 Other forms of dyspnea: Secondary | ICD-10-CM

## 2013-05-13 DIAGNOSIS — I714 Abdominal aortic aneurysm, without rupture, unspecified: Secondary | ICD-10-CM

## 2013-05-13 DIAGNOSIS — E785 Hyperlipidemia, unspecified: Secondary | ICD-10-CM

## 2013-05-13 DIAGNOSIS — J449 Chronic obstructive pulmonary disease, unspecified: Secondary | ICD-10-CM

## 2013-05-13 NOTE — Patient Instructions (Signed)
Your physician recommends that you schedule a follow-up appointment in: 6 MONTHS. No changes were made today in your therapy. 

## 2013-05-14 ENCOUNTER — Encounter: Payer: Self-pay | Admitting: Cardiovascular Disease

## 2013-05-14 NOTE — Progress Notes (Signed)
Patient ID: EDWYN Evans, male   DOB: 1951-06-02, 62 y.o.   MRN: [id number]      HPI: Matthew Evans, is a 62 y.o. male who presents to the office today for a six-month cardiology followup evaluation.    [name] has established coronary artery disease.  In April 2009 suffered an ST segment elevation inferior MI and underwent RCA stenting. He underwent staged cutting balloon to a high grade LAD stenosis which involved multiple branches and stenting was not done to reduce potential for stent jailing of his multiple branches. In August 2010 he was found to have 50-60% stenosis beyond his RCA stent in the LAD intervention site remained patent. Has a documented abdominal aortic aneurysm and on 08/29/2012 underwent a one-year followup evaluation with Doppler imaging. This was essentially unchanged from one year previously and showed a measurement of 3.4x3.7 cm.  Additional problems include COPD, hypertension, hypothyroidism, hyperlipidemia, GERD. His last nuclear study in February 2014 remained low risk and did show mild apical hypokinesis with low normal wall motion without ischemia. An echo Doppler study in February 2014 showed mild LVH with grade 1 diastolic dysfunction and normal systolic function. He did have aortic valve thickening without stenosis and mild left atrial dilatation. Had normal pulmonary pressures.  Mr. Lannen does have a history of palpitations which has been treated with beta blocker therapy.   Laboratory revealed a normal hemoglobin and hematocrit as well as comprehensive metabolic panel his total cholesterol is 114 triglyceride 74 LDL 65 HDL 44. TSH was 5.349.  [name] admits to occasional left chest pain sensation that is short lived and not exertion precipitated. He states that he essentially does not do any exercise or activity per he denies exertional chest pain. He didn't denies any progressive shortness of breath. He does have COPD.   Past Medical History  Diagnosis Date  .  Myocardial infarction 09/2007    PCI to RCA  . Carotid artery occlusion   . AAA (abdominal aortic aneurysm)     mild; doppler 08/29/12-3.4x3.7  . Thyroid disease     hypothyroidism  . Emphysema   . Mini stroke   . Sleep apnea     sleep study 12/14/06 ahi 16.89, during rem 12.3, supine ahi 57.49; cpap 02/22/07-c-flex of 3 at 13cm h2o  . Lupus   . Memory loss     d/t mini stroke  . CAD (coronary artery disease)     echo 07/26/12-EF 55-60%; myoview 07/26/12-low normal wall motion with mild apical hypokinesis as well a septal wall motion consistent with LBBB  . HTN (hypertension)   . Hyperlipidemia   . GERD (gastroesophageal reflux disease)     Past Surgical History  Procedure Laterality Date  . Coronary stent placement  09/2007    right  . Balloon atherotomy      LAD  . Cardiac catheterization  08/11/11    EF50-55%, small AAA, no signifiant stent restenosis in RCA , no restenosis of PTCA of LAD, medical therapy  . Cardiac catheterization  09/27/06    cutting balloon arthrotomy of LAD stenosis with dilatation of a 3.5x62mm cutting balloon  . Coronary angioplasty with stent placement  09/24/06    PTCA/stent of RCA with a 3.5x18mm Taxus DES post dilated to 3.58mm  . Cardiac catheterization  01/07/09    50-60% progressive dz beyond the stented segment in the RCA , LAD intervention remained patent    Allergies  Allergen Reactions  . Latex   . Penicillins   .  Septra [Sulfamethoxazole-Trimethoprim]   . Zocor [Simvastatin]   . Niaspan [Niacin] Rash  . Simcor [Niacin-Simvastatin Er] Rash    Current Outpatient Prescriptions  Medication Sig Dispense Refill  . amLODipine (NORVASC) 5 MG tablet TAKE 1 TABLET BY MOUTH EVERY DAY  30 tablet  6  . aspirin EC 81 MG tablet Take 81 mg by mouth daily.        . clopidogrel (PLAVIX) 75 MG tablet Take 1 tablet (75 mg total) by mouth daily.  90 tablet  2  . hydrochlorothiazide (MICROZIDE) 12.5 MG capsule Take 1 capsule (12.5 mg total) by mouth every other  day.  45 capsule  2  . hydroxychloroquine (PLAQUENIL) 200 MG tablet Take 2 tablets by mouth daily.      . isosorbide mononitrate (IMDUR) 60 MG 24 hr tablet Take 1 tablet (60 mg total) by mouth daily.  90 tablet  3  . levothyroxine (SYNTHROID, LEVOTHROID) 150 MCG tablet Take 150 mcg by mouth daily.        Marland Kitchen losartan (COZAAR) 50 MG tablet Take 1 tablet (50 mg total) by mouth daily.  90 tablet  3  . metoprolol succinate (TOPROL XL) 25 MG 24 hr tablet Take 1/2 to 1 tablet by mouth daily.  90 tablet  3  . omega-3 acid ethyl esters (LOVAZA) 1 G capsule Take 1 g by mouth 2 (two) times daily.      Marland Kitchen omeprazole (PRILOSEC OTC) 20 MG tablet Take 20 mg by mouth daily.      . pantoprazole (PROTONIX) 40 MG tablet Take 1 tablet (40 mg total) by mouth daily.  90 tablet  2  . ramipril (ALTACE) 10 MG capsule Take 10 mg by mouth daily.      Marland Kitchen Umeclidinium-Vilanterol (ANORO ELLIPTA) 62.5-25 MCG/INH AEPB Inhale 1 puff into the lungs daily.  30 each  6   No current facility-administered medications for this visit.    Socially he is married has 3 children. There is a remote tobacco history. He has tried to limit his sodium intake.  ROS is negative for fever chills night sweats. He denies rash. He denies change in vision or hearing. He is unaware of lymphadenopathy. He denies wheezing.  He is unaware of palpitations. There is no chest pressure He denies bleeding. He denies abdominal pain. He denies change in bowel habits the does not some occasional low back discomfort and transient intermittent paresthesias of his leg. He denies significant swelling. He denies claudication. There is no diabetes. He denies cold or heat intolerance to Other comprehensive 12 point system review is negative.  PE BP 130/70  Pulse 50  Ht 5\' 8"  (1.727 m)  Wt 217 lb 14.4 oz (98.839 kg)  BMI 33.14 kg/m2  General: Alert, oriented, no distress HEENT: Normocephalic, atraumatic. Pupils round and reactive; sclera anicteric;  Nose without  nasal septal hypertrophy Mouth/Parynx benign; Mallinpatti scale 2/3 Neck: No JVD, no carotid briuts Lungs: decreased breath sounds; no wheezing or rales Heart: RRR, s1 s2 normal 1/6 sem Abdomen: soft, nontender; no hepatosplenomehaly, BS+; abdominal aorta nontender and not dilated by palpation.  Back: No CVA tenderness  Moderate central adiposity Pulses 2+ Extremities: no clubbinbg cyanosis or edema, Homan's sign negative  Neurologic: grossly nonfocal Psychological: Normal affect and mood are  ECG: Sinus bradycardia 50 beats per minute. beats per minute with mild left axis deviation and left bundle branch block. There is complete resolution of prior ectopy.   LABS:  BMET    Component Value Date/Time  NA 140 10/29/2012 1634   K 4.1 10/29/2012 1634   CL 106 10/29/2012 1634   CO2 27 10/29/2012 1634   GLUCOSE 91 10/29/2012 1634   BUN 16 10/29/2012 1634   CREATININE 1.24 10/29/2012 1634   CREATININE 1.40* 03/31/2011 1700   CALCIUM 8.6 10/29/2012 1634   GFRNONAA 53* 03/31/2011 1700   GFRAA 62* 03/31/2011 1700     Hepatic Function Panel     Component Value Date/Time   PROT 6.0 10/29/2012 1634   ALBUMIN 3.8 10/29/2012 1634   AST 15 10/29/2012 1634   ALT 11 10/29/2012 1634   ALKPHOS 79 10/29/2012 1634   BILITOT 0.5 10/29/2012 1634     CBC    Component Value Date/Time   WBC 5.2 10/29/2012 1617   RBC 5.06 10/29/2012 1617   HGB 13.8 10/29/2012 1617   HCT 40.8 10/29/2012 1617   PLT 255 10/29/2012 1617   MCV 80.6 10/29/2012 1617   MCH 27.3 10/29/2012 1617   MCHC 33.8 10/29/2012 1617   RDW 13.8 10/29/2012 1617   LYMPHSABS 1.2 03/31/2011 1700   MONOABS 1.0 03/31/2011 1700   EOSABS 0.1 03/31/2011 1700   BASOSABS 0.1 03/31/2011 1700     BNP No results found for this basename: probnp    Lipid Panel     Component Value Date/Time   CHOL 114 10/29/2012 1634     RADIOLOGY: No results found.    ASSESSMENT AND PLAN: From a cardiac standpoint, Mr. Range remains chest pain-free 5  years status post his inferior wall MI treated with stenting of his right coronary artery and staged intervention to his LAD vessel. He has been on medical therapy for concomitant coronary disease. He has tolerated the addition of Toprol and has complete ectopy suppression. Is not having any anginal symptoms. Discussed importance of activity and exercise. He admits that he is a "couch potato."  His blood pressure is well controlled.. I did review his blood work with him in detail. As long as he remains stable I will see him in 6 months for followup evaluation.  Lennette Bihari, MD, [facility]  05/14/2013 6:54 PM

## 2013-05-15 ENCOUNTER — Encounter: Payer: Self-pay | Admitting: Cardiovascular Disease

## 2013-05-26 ENCOUNTER — Other Ambulatory Visit: Payer: Self-pay | Admitting: Cardiovascular Disease

## 2013-05-27 NOTE — Telephone Encounter (Signed)
Rx was sent to pharmacy electronically. 

## 2013-07-02 ENCOUNTER — Other Ambulatory Visit: Payer: Self-pay | Admitting: Gastroenterology

## 2013-07-02 DIAGNOSIS — R1310 Dysphagia, unspecified: Secondary | ICD-10-CM

## 2013-07-04 ENCOUNTER — Ambulatory Visit
Admission: RE | Admit: 2013-07-04 | Discharge: 2013-07-04 | Disposition: A | Discharge status: Home / Self Care | Payer: BC Managed Care – PPO | Source: Ambulatory Visit | Attending: Gastroenterology | Admitting: Gastroenterology

## 2013-07-04 ENCOUNTER — Other Ambulatory Visit: Payer: BC Managed Care – PPO

## 2013-07-04 DIAGNOSIS — R1310 Dysphagia, unspecified: Secondary | ICD-10-CM

## 2013-07-20 ENCOUNTER — Other Ambulatory Visit: Payer: Self-pay | Admitting: Cardiovascular Disease

## 2013-08-04 ENCOUNTER — Ambulatory Visit (HOSPITAL_COMMUNITY)
Admission: RE | Admit: 2013-08-04 | Discharge: 2013-08-04 | Disposition: A | Discharge status: Home / Self Care | Payer: BC Managed Care – PPO | Source: Ambulatory Visit | Attending: Gastroenterology | Admitting: Gastroenterology

## 2013-08-04 ENCOUNTER — Encounter (HOSPITAL_COMMUNITY)
Admission: RE | Disposition: A | Discharge status: Home / Self Care | Payer: Self-pay | Source: Ambulatory Visit | Attending: Gastroenterology

## 2013-08-04 DIAGNOSIS — Z87891 Personal history of nicotine dependence: Secondary | ICD-10-CM | POA: Insufficient documentation

## 2013-08-04 DIAGNOSIS — M329 Systemic lupus erythematosus, unspecified: Secondary | ICD-10-CM | POA: Insufficient documentation

## 2013-08-04 DIAGNOSIS — R1310 Dysphagia, unspecified: Secondary | ICD-10-CM | POA: Insufficient documentation

## 2013-08-04 DIAGNOSIS — Z8673 Personal history of transient ischemic attack (TIA), and cerebral infarction without residual deficits: Secondary | ICD-10-CM | POA: Insufficient documentation

## 2013-08-04 DIAGNOSIS — K449 Diaphragmatic hernia without obstruction or gangrene: Secondary | ICD-10-CM | POA: Insufficient documentation

## 2013-08-04 DIAGNOSIS — I2510 Atherosclerotic heart disease of native coronary artery without angina pectoris: Secondary | ICD-10-CM | POA: Insufficient documentation

## 2013-08-04 DIAGNOSIS — J438 Other emphysema: Secondary | ICD-10-CM | POA: Insufficient documentation

## 2013-08-04 DIAGNOSIS — I252 Old myocardial infarction: Secondary | ICD-10-CM | POA: Insufficient documentation

## 2013-08-04 DIAGNOSIS — Z888 Allergy status to other drugs, medicaments and biological substances status: Secondary | ICD-10-CM | POA: Insufficient documentation

## 2013-08-04 DIAGNOSIS — G4730 Sleep apnea, unspecified: Secondary | ICD-10-CM | POA: Insufficient documentation

## 2013-08-04 DIAGNOSIS — I714 Abdominal aortic aneurysm, without rupture, unspecified: Secondary | ICD-10-CM | POA: Insufficient documentation

## 2013-08-04 DIAGNOSIS — Z91040 Latex allergy status: Secondary | ICD-10-CM | POA: Insufficient documentation

## 2013-08-04 DIAGNOSIS — K219 Gastro-esophageal reflux disease without esophagitis: Secondary | ICD-10-CM | POA: Insufficient documentation

## 2013-08-04 DIAGNOSIS — E785 Hyperlipidemia, unspecified: Secondary | ICD-10-CM | POA: Insufficient documentation

## 2013-08-04 DIAGNOSIS — R112 Nausea with vomiting, unspecified: Secondary | ICD-10-CM | POA: Insufficient documentation

## 2013-08-04 DIAGNOSIS — Z9861 Coronary angioplasty status: Secondary | ICD-10-CM | POA: Insufficient documentation

## 2013-08-04 DIAGNOSIS — E039 Hypothyroidism, unspecified: Secondary | ICD-10-CM | POA: Insufficient documentation

## 2013-08-04 DIAGNOSIS — I10 Essential (primary) hypertension: Secondary | ICD-10-CM | POA: Insufficient documentation

## 2013-08-04 DIAGNOSIS — I6529 Occlusion and stenosis of unspecified carotid artery: Secondary | ICD-10-CM | POA: Insufficient documentation

## 2013-08-04 DIAGNOSIS — Z880 Allergy status to penicillin: Secondary | ICD-10-CM | POA: Insufficient documentation

## 2013-08-04 HISTORY — PX: ESOPHAGEAL MANOMETRY: SHX5429

## 2013-08-04 SURGERY — MANOMETRY, ESOPHAGUS

## 2013-08-04 MED ORDER — LIDOCAINE VISCOUS 2 % MT SOLN
OROMUCOSAL | Status: AC
  Filled 2013-08-04: qty 15

## 2013-08-04 SURGICAL SUPPLY — 1 items: FACESHIELD LNG OPTICON STERILE (SAFETY) IMPLANT

## 2013-08-05 ENCOUNTER — Encounter (HOSPITAL_COMMUNITY): Payer: Self-pay | Admitting: Gastroenterology

## 2013-08-06 ENCOUNTER — Ambulatory Visit (INDEPENDENT_AMBULATORY_CARE_PROVIDER_SITE_OTHER): Payer: BC Managed Care – PPO | Admitting: General Surgery

## 2013-08-06 ENCOUNTER — Encounter (INDEPENDENT_AMBULATORY_CARE_PROVIDER_SITE_OTHER): Payer: Self-pay | Admitting: General Surgery

## 2013-08-06 ENCOUNTER — Encounter (INDEPENDENT_AMBULATORY_CARE_PROVIDER_SITE_OTHER): Payer: Self-pay

## 2013-08-06 ENCOUNTER — Other Ambulatory Visit (INDEPENDENT_AMBULATORY_CARE_PROVIDER_SITE_OTHER): Payer: Self-pay

## 2013-08-06 VITALS — BP 136/80 | HR 78 | Temp 97.5°F | Resp 20 | Ht 68.0 in | Wt 218.0 lb

## 2013-08-06 DIAGNOSIS — R1110 Vomiting, unspecified: Secondary | ICD-10-CM

## 2013-08-06 DIAGNOSIS — R1310 Dysphagia, unspecified: Secondary | ICD-10-CM

## 2013-08-06 NOTE — Progress Notes (Signed)
Patient ID: Matthew Evans, male   DOB: 13-Nov-1950, 63 y.o.   MRN: [id number]  No chief complaint on file.   HPI Matthew Evans is a 63 y.o. male.  The patient is a 63 year old male who is referred by Dr. Elnoria Howard for evaluation of dysphagia. Patient said a 6-8 month history of nausea and emesis. The patient's previously been treated for GERD. Patient has undergone a swallowing evaluation as well as an EGD which revealed a small hiatal hernia approximately 3 cm. Patient underwent an esophageal manometry results are currently pending.  Of note the patient had a previous history of cardiac stent and see Dr. Nicholaus Bloom. Patient also been seen by Dr. Marilu Favre pulmonologist, for his COPD and dyspnea on exertion. The patient also has a triple-A which is approximately 3.4 x 3.7 cm as per ultrasound 1 year ago. HPI  Past Medical History  Diagnosis Date  . Myocardial infarction 09/2007    PCI to RCA  . Carotid artery occlusion   . AAA (abdominal aortic aneurysm)     mild; doppler 08/29/12-3.4x3.7  . Thyroid disease     hypothyroidism  . Emphysema   . Mini stroke   . Sleep apnea     sleep study 12/14/06 ahi 16.89, during rem 12.3, supine ahi 57.49; cpap 02/22/07-c-flex of 3 at 13cm h2o  . Lupus   . Memory loss     d/t mini stroke  . CAD (coronary artery disease)     echo 07/26/12-EF 55-60%; myoview 07/26/12-low normal wall motion with mild apical hypokinesis as well a septal wall motion consistent with LBBB  . HTN (hypertension)   . Hyperlipidemia   . GERD (gastroesophageal reflux disease)     Past Surgical History  Procedure Laterality Date  . Coronary stent placement  09/2007    right  . Balloon atherotomy      LAD  . Cardiac catheterization  08/11/11    EF50-55%, small AAA, no signifiant stent restenosis in RCA , no restenosis of PTCA of LAD, medical therapy  . Cardiac catheterization  09/27/06    cutting balloon arthrotomy of LAD stenosis with dilatation of a 3.5x71mm cutting balloon  . Coronary  angioplasty with stent placement  09/24/06    PTCA/stent of RCA with a 3.5x92mm Taxus DES post dilated to 3.32mm  . Cardiac catheterization  01/07/09    50-60% progressive dz beyond the stented segment in the RCA , LAD intervention remained patent  . Esophageal manometry N/A 08/04/2013    Procedure: ESOPHAGEAL MANOMETRY (EM);  Surgeon: Theda Belfast, MD;  Location: [facility];  Service: Endoscopy;  Laterality: N/A;    Family History  Problem Relation Age of Onset  . Emphysema Mother   . Heart disease Father   . Mesothelioma Father   . Heart attack Father     Social History History  Substance Use Topics  . Smoking status: Former Smoker -- 2.00 packs/day for 45 years    Types: Cigarettes    Quit date: 02/04/2010  . Smokeless tobacco: Not on file  . Alcohol Use: No     Comment: QUIT 1994    Allergies  Allergen Reactions  . Latex   . Penicillins   . Septra [Sulfamethoxazole-Trimethoprim]   . Zocor [Simvastatin]   . Niaspan [Niacin] Rash  . Simcor [Niacin-Simvastatin Er] Rash    Current Outpatient Prescriptions  Medication Sig Dispense Refill  . amLODipine (NORVASC) 5 MG tablet TAKE 1 TABLET BY MOUTH EVERY DAY  30 tablet  6  .  aspirin EC 81 MG tablet Take 81 mg by mouth daily.        . clopidogrel (PLAVIX) 75 MG tablet Take 1 tablet (75 mg total) by mouth daily.  90 tablet  2  . hydrochlorothiazide (MICROZIDE) 12.5 MG capsule Take 1 capsule (12.5 mg total) by mouth every other day.  30 capsule  3  . hydroxychloroquine (PLAQUENIL) 200 MG tablet Take 2 tablets by mouth daily.      . isosorbide mononitrate (IMDUR) 60 MG 24 hr tablet Take 1 tablet (60 mg total) by mouth daily.  30 tablet  5  . levothyroxine (SYNTHROID, LEVOTHROID) 150 MCG tablet Take 150 mcg by mouth daily.        Marland Kitchen. losartan (COZAAR) 50 MG tablet Take 1 tablet (50 mg total) by mouth daily.  90 tablet  3  . metoprolol succinate (TOPROL XL) 25 MG 24 hr tablet Take 1/2 to 1 tablet by mouth daily.  90 tablet  3  .  omeprazole (PRILOSEC OTC) 20 MG tablet Take 20 mg by mouth daily.      . pantoprazole (PROTONIX) 40 MG tablet Take 1 tablet (40 mg total) by mouth daily.  90 tablet  2  . Umeclidinium-Vilanterol (ANORO ELLIPTA) 62.5-25 MCG/INH AEPB Inhale 1 puff into the lungs daily.  30 each  6   No current facility-administered medications for this visit.    Review of Systems Review of Systems  Constitutional: Negative.   HENT: Negative.   Eyes: Negative.   Respiratory: Negative.   Cardiovascular: Negative.   Gastrointestinal: Negative.   Endocrine: Negative.   Neurological: Negative.     Blood pressure 136/80, pulse 78, temperature 97.5 F (36.4 C), temperature source Oral, resp. rate 20, height 5\' 8"  (1.727 m), weight 218 lb (98.884 kg).  Physical Exam Physical Exam  Constitutional: He is oriented to person, place, and time. He appears well-developed and well-nourished.  HENT:  Head: Normocephalic and atraumatic.  Eyes: Conjunctivae and EOM are normal. Pupils are equal, round, and reactive to light.  Neck: Normal range of motion. Neck supple.  Cardiovascular: Normal rate, regular rhythm and normal heart sounds.   Pulmonary/Chest: Effort normal and breath sounds normal.  Abdominal: Soft. Bowel sounds are normal. He exhibits no distension and no mass. There is no tenderness. There is no rebound and no guarding.  Musculoskeletal: Normal range of motion.  Neurological: He is alert and oriented to person, place, and time.  Skin: Skin is warm and dry.    Data Reviewed As above  Assessment    63 year old male with dysphagia and hiatal hernia. Patient underwent manometry studies which are currently pending to rule out any type of achalasia or other esophageal dysmotility issues.     Plan    1. Patient will need to undergo a gastric emptying study in addition to his above studies. 2. Patient to be seen by Dr. Tresa EndoKelly , his cardiologist to see if he can be off of his Plavix prior to surgery as  well as cardiac evaluation prior surgery 3. The patient will also need to be seen by Dr. Shelle Ironlance secondary to his dyspnea on exertion as well as his COPD, and pulmonary evaluation prior surgery. 4. Once he's been obtained his manometry and nasogastric imaging studies have been reviewed with the patient come back to clinic to discuss his findings and discuss any further surgical treatment.        Matthew Ehlersamirez Jr., Matthew Evans 08/06/2013, 3:01 PM

## 2013-08-07 ENCOUNTER — Telehealth (INDEPENDENT_AMBULATORY_CARE_PROVIDER_SITE_OTHER): Payer: Self-pay

## 2013-08-07 ENCOUNTER — Telehealth (INDEPENDENT_AMBULATORY_CARE_PROVIDER_SITE_OTHER): Payer: Self-pay | Admitting: *Deleted

## 2013-08-07 NOTE — H&P (Signed)
  Matthew EssexEddie R Longino HPI: This is a 63 year old male with complains of nausea, vomiting, and dysphagia for the past 6 months.  Work up with an EGD revealed a 3 cm hiatal hernia.  He continues to have issues with upper GI symptoms despite GERD treatment.  He is here today for a manometry.  Past Medical History  Diagnosis Date  . Myocardial infarction 09/2007    PCI to RCA  . Carotid artery occlusion   . AAA (abdominal aortic aneurysm)     mild; doppler 08/29/12-3.4x3.7  . Thyroid disease     hypothyroidism  . Emphysema   . Mini stroke   . Sleep apnea     sleep study 12/14/06 ahi 16.89, during rem 12.3, supine ahi 57.49; cpap 02/22/07-c-flex of 3 at 13cm h2o  . Lupus   . Memory loss     d/t mini stroke  . CAD (coronary artery disease)     echo 07/26/12-EF 55-60%; myoview 07/26/12-low normal wall motion with mild apical hypokinesis as well a septal wall motion consistent with LBBB  . HTN (hypertension)   . Hyperlipidemia   . GERD (gastroesophageal reflux disease)     Past Surgical History  Procedure Laterality Date  . Coronary stent placement  09/2007    right  . Balloon atherotomy      LAD  . Cardiac catheterization  08/11/11    EF50-55%, small AAA, no signifiant stent restenosis in RCA , no restenosis of PTCA of LAD, medical therapy  . Cardiac catheterization  09/27/06    cutting balloon arthrotomy of LAD stenosis with dilatation of a 3.5x7615mm cutting balloon  . Coronary angioplasty with stent placement  09/24/06    PTCA/stent of RCA with a 3.5x9020mm Taxus DES post dilated to 3.8296mm  . Cardiac catheterization  01/07/09    50-60% progressive dz beyond the stented segment in the RCA , LAD intervention remained patent  . Esophageal manometry N/A 08/04/2013    Procedure: ESOPHAGEAL MANOMETRY (EM);  Surgeon: Theda BelfastPatrick D Mirakle Tomlin, MD;  Location: [facility];  Service: Endoscopy;  Laterality: N/A;    Family History  Problem Relation Age of Onset  . Emphysema Mother   . Heart disease Father   .  Mesothelioma Father   . Heart attack Father     Social History:  reports that he quit smoking about 3 years ago. His smoking use included Cigarettes. He has a 90 pack-year smoking history. He does not have any smokeless tobacco history on file. He reports that he does not drink alcohol or use illicit drugs.  Allergies:  Allergies  Allergen Reactions  . Latex   . Penicillins   . Septra [Sulfamethoxazole-Trimethoprim]   . Zocor [Simvastatin]   . Niaspan [Niacin] Rash  . Simcor [Niacin-Simvastatin Er] Rash    Medications: Scheduled: Continuous:  No results found for this or any previous visit (from the past 24 hour(s)).   No results found.  ROS:  As stated above in the HPI otherwise negative.  There were no vitals taken for this visit.    PE: No performed.  Assessment/Plan: 1) Nausea, vomiting, and dysphagia.  Plan: 1) Manometry.  Noemi Ishmael D 08/07/2013, 9:20 AM

## 2013-08-07 NOTE — Telephone Encounter (Signed)
LMOM for pt to return my call.  I was calling to notify him of the appt for his gastric emptying study at [facility] on 08/15/13 with an arrival time of 7:15am.  Pt is to be NPO after midnight and should be instructed to take NO stomach meds after midnight as well.

## 2013-08-07 NOTE — Telephone Encounter (Signed)
Office notes from 08/06/13 office visit with Dr. Derrell Lollingamirez fax[facility] [phone].  Fax confirmation rec'd.

## 2013-08-07 NOTE — Telephone Encounter (Signed)
Patients wife Larita FifeLynn called in and I provided her with appt information below.  She is agreeable at this time.

## 2013-08-15 ENCOUNTER — Encounter (INDEPENDENT_AMBULATORY_CARE_PROVIDER_SITE_OTHER): Payer: Self-pay

## 2013-08-15 ENCOUNTER — Ambulatory Visit (HOSPITAL_COMMUNITY)
Admission: RE | Admit: 2013-08-15 | Discharge: 2013-08-15 | Disposition: A | Discharge status: Home / Self Care | Payer: BC Managed Care – PPO | Source: Ambulatory Visit | Attending: General Surgery | Admitting: General Surgery

## 2013-08-15 DIAGNOSIS — R1110 Vomiting, unspecified: Secondary | ICD-10-CM

## 2013-08-15 MED ORDER — TECHNETIUM TC 99M SULFUR COLLOID
2.1000 | Freq: Once | INTRAVENOUS | Status: AC | PRN
  Administered 2013-08-15: 2.1 via ORAL

## 2013-08-22 ENCOUNTER — Telehealth (INDEPENDENT_AMBULATORY_CARE_PROVIDER_SITE_OTHER): Payer: Self-pay | Admitting: *Deleted

## 2013-08-22 NOTE — Telephone Encounter (Signed)
Patient's wife called to state that patient has completed all the tests that Dr. Derrell Lollingamirez wanted him to do, now they are wondering when surgery can be scheduled.  Explained I would send a message to Dr. Derrell Lollingamirez to ask about orders being placed then we will let them know.  Wife states understanding at this time.

## 2013-08-25 ENCOUNTER — Telehealth (INDEPENDENT_AMBULATORY_CARE_PROVIDER_SITE_OTHER): Payer: Self-pay

## 2013-08-25 NOTE — Telephone Encounter (Signed)
Called patient to make patient aware that Dr. Derrell Lollingamirez would like to see him in the office to discuss surgery.  Unable to leave message for patient.  Patient has appointment scheduled for 08/28/13 @ 12:10 pm w/Dr. Derrell Lollingamirez

## 2013-08-25 NOTE — Telephone Encounter (Signed)
Can we get him back to clinic to we can discuss surgery please

## 2013-08-28 ENCOUNTER — Other Ambulatory Visit (INDEPENDENT_AMBULATORY_CARE_PROVIDER_SITE_OTHER): Payer: Self-pay | Admitting: General Surgery

## 2013-08-28 ENCOUNTER — Telehealth: Payer: Self-pay | Admitting: Cardiovascular Disease

## 2013-08-28 ENCOUNTER — Telehealth (INDEPENDENT_AMBULATORY_CARE_PROVIDER_SITE_OTHER): Payer: Self-pay

## 2013-08-28 ENCOUNTER — Ambulatory Visit (INDEPENDENT_AMBULATORY_CARE_PROVIDER_SITE_OTHER): Payer: BC Managed Care – PPO | Admitting: General Surgery

## 2013-08-28 ENCOUNTER — Encounter (INDEPENDENT_AMBULATORY_CARE_PROVIDER_SITE_OTHER): Payer: Self-pay | Admitting: General Surgery

## 2013-08-28 VITALS — BP 134/74 | HR 77 | Temp 97.8°F | Resp 16 | Ht 68.0 in | Wt 221.0 lb

## 2013-08-28 DIAGNOSIS — K219 Gastro-esophageal reflux disease without esophagitis: Secondary | ICD-10-CM

## 2013-08-28 NOTE — Telephone Encounter (Signed)
Has been waiting for his surgical clearance which has been submitted to us twice  Please call.

## 2013-08-28 NOTE — Telephone Encounter (Signed)
Faxed clearance letter for  patient to have Laparoscopic Hiatal Hernia Repair.

## 2013-08-28 NOTE — Telephone Encounter (Signed)
Wanting to know if their request for surgical clearance on Matthew Evans is approved.  Patient is coming in their office today at 12 to meet and discuss surgery.  Needs surgical clearance letter   Request was submitted to us on march 4.  Please call,

## 2013-08-28 NOTE — Telephone Encounter (Signed)
Per Burna MortimerWanda, clearance form faxed.  Returned call.  Left message on Lynn's voicemail that clearance form faxed today and to call back before 4 pm if questions.

## 2013-08-28 NOTE — Telephone Encounter (Signed)
Located letter sent to Dr. Tresa EndoKelly on 3.4.15.  Letter printed and placed on Dr. Landry DykeKelly's cart for review and to make contact w/ Dr. Jacinto Halimamirez's office r/t cardiac clearance.  Message forwarded to Dr. Pierre BaliKelly/Wanda, CMA.

## 2013-08-28 NOTE — Telephone Encounter (Signed)
Called Dr. Michel HarrowKelley's office to check status of Pre-Op Cardiac Clearance that was sent over on 08/06/2013.  Message to be forwarded to Dr. Tresa EndoKelly for review and we will await a return call from Dr. Landry DykeKelly's office.

## 2013-08-28 NOTE — Progress Notes (Signed)
Patient ID: Matthew Evans, male   DOB: [birth year], 63 y.o.   MRN: [id number]  No chief complaint on file.   HPI Matthew Evans is a 63 y.o. male.  The patient is a 63 year old male he is pacing didn't seem secondary to hiatal hernia and gastric reflux. The patient again gives a history of esophageal pain, burning, waterbrash, and nocturnal emesis.  The patient has undergone a manometry test which reveals no signs of achalasia. The patient also underwent a gastric emptying study which was normal.  HPI  Past Medical History  Diagnosis Date  . Myocardial infarction 09/2007    PCI to RCA  . Carotid artery occlusion   . AAA (abdominal aortic aneurysm)     mild; doppler 08/29/12-3.4x3.7  . Thyroid disease     hypothyroidism  . Emphysema   . Mini stroke   . Sleep apnea     sleep study 12/14/06 ahi 16.89, during rem 12.3, supine ahi 57.49; cpap 02/22/07-c-flex of 3 at 13cm h2o  . Lupus   . Memory loss     d/t mini stroke  . CAD (coronary artery disease)     echo 07/26/12-EF 55-60%; myoview 07/26/12-low normal wall motion with mild apical hypokinesis as well a septal wall motion consistent with LBBB  . HTN (hypertension)   . Hyperlipidemia   . GERD (gastroesophageal reflux disease)     Past Surgical History  Procedure Laterality Date  . Coronary stent placement  09/2007    right  . Balloon atherotomy      LAD  . Cardiac catheterization  08/11/11    EF50-55%, small AAA, no signifiant stent restenosis in RCA , no restenosis of PTCA of LAD, medical therapy  . Cardiac catheterization  09/27/06    cutting balloon arthrotomy of LAD stenosis with dilatation of a 3.5x86mm cutting balloon  . Coronary angioplasty with stent placement  09/24/06    PTCA/stent of RCA with a 3.5x1mm Taxus DES post dilated to 3.26mm  . Cardiac catheterization  01/07/09    50-60% progressive dz beyond the stented segment in the RCA , LAD intervention remained patent  . Esophageal manometry N/A 08/04/2013    Procedure:  ESOPHAGEAL MANOMETRY (EM);  Surgeon: Theda Belfast, MD;  Location: [facility];  Service: Endoscopy;  Laterality: N/A;    Family History  Problem Relation Age of Onset  . Emphysema Mother   . Heart disease Father   . Mesothelioma Father   . Heart attack Father     Social History History  Substance Use Topics  . Smoking status: Former Smoker -- 2.00 packs/day for 45 years    Types: Cigarettes    Quit date: 02/04/2010  . Smokeless tobacco: Not on file  . Alcohol Use: No     Comment: QUIT 1994    Allergies  Allergen Reactions  . Latex   . Penicillins   . Septra [Sulfamethoxazole-Trimethoprim]   . Zocor [Simvastatin]   . Niaspan [Niacin] Rash  . Simcor [Niacin-Simvastatin Er] Rash    Current Outpatient Prescriptions  Medication Sig Dispense Refill  . amLODipine (NORVASC) 5 MG tablet TAKE 1 TABLET BY MOUTH EVERY DAY  30 tablet  6  . aspirin EC 81 MG tablet Take 81 mg by mouth daily.        . clopidogrel (PLAVIX) 75 MG tablet Take 1 tablet (75 mg total) by mouth daily.  90 tablet  2  . hydrochlorothiazide (MICROZIDE) 12.5 MG capsule Take 1 capsule (12.5 mg total) by mouth  every other day.  30 capsule  3  . hydroxychloroquine (PLAQUENIL) 200 MG tablet Take 2 tablets by mouth daily.      . isosorbide mononitrate (IMDUR) 60 MG 24 hr tablet Take 1 tablet (60 mg total) by mouth daily.  30 tablet  5  . levothyroxine (SYNTHROID, LEVOTHROID) 150 MCG tablet Take 150 mcg by mouth daily.        Marland Kitchen. losartan (COZAAR) 50 MG tablet Take 1 tablet (50 mg total) by mouth daily.  90 tablet  3  . metoprolol succinate (TOPROL XL) 25 MG 24 hr tablet Take 1/2 to 1 tablet by mouth daily.  90 tablet  3  . omeprazole (PRILOSEC OTC) 20 MG tablet Take 20 mg by mouth daily.      . pantoprazole (PROTONIX) 40 MG tablet Take 1 tablet (40 mg total) by mouth daily.  90 tablet  2  . Umeclidinium-Vilanterol (ANORO ELLIPTA) 62.5-25 MCG/INH AEPB Inhale 1 puff into the lungs daily.  30 each  6   No current  facility-administered medications for this visit.    Review of Systems Review of Systems  Constitutional: Negative.   HENT: Negative.   Eyes: Negative.   Respiratory: Negative.   Cardiovascular: Negative.   Gastrointestinal: Negative.   Endocrine: Negative.   Neurological: Negative.     Blood pressure 134/74, pulse 77, temperature 97.8 F (36.6 C), temperature source Oral, resp. rate 16, height 5\' 8"  (1.727 m), weight 221 lb (100.245 kg).  Physical Exam Physical Exam  Constitutional: He is oriented to person, place, and time. He appears well-developed and well-nourished.  HENT:  Head: Normocephalic and atraumatic.  Eyes: Conjunctivae and EOM are normal. Pupils are equal, round, and reactive to light.  Neck: Normal range of motion. Neck supple.  Cardiovascular: Normal rate, regular rhythm and normal heart sounds.   Pulmonary/Chest: Effort normal and breath sounds normal.  Abdominal: Soft. Bowel sounds are normal. He exhibits no distension and no mass. There is no tenderness. There is no rebound and no guarding.  Musculoskeletal: Normal range of motion.  Neurological: He is alert and oriented to person, place, and time.  Skin: Skin is warm and dry.    Data Reviewed As above  Assessment    63 year old male with hiatal hernia and gastric reflux     Plan    1. We'll proceed to the operating room for robotic hiatal hernia repair and Nissen fundoplication 2. I discussed the risks and benefits of the procedure to include but not limited to: Infection, bleeding, damage to surrounding structures and esophageal leak, possible need for further surgery, and possible recurrence. The patient voiced understanding and wishes to proceed.  3. at this time we're waiting for a cardiac evaluation as per Dr. Nicholaus BloomKelley. The patient will also need to stop his Plavix approximately 5 days prior to surgery. 4. Patient has had pulmonary clearance as per Dr. Shelle Ironlance.       Matthew Ehlersamirez Jr.,  Matthew Evans 08/28/2013, 12:22 PM

## 2013-09-03 ENCOUNTER — Other Ambulatory Visit: Payer: Self-pay

## 2013-09-03 MED ORDER — LOSARTAN POTASSIUM 50 MG PO TABS: 50.0000 mg | ORAL_TABLET | Freq: Every day | ORAL | Status: DC

## 2013-09-03 NOTE — Telephone Encounter (Signed)
Rx was sent to pharmacy electronically. 

## 2013-09-16 ENCOUNTER — Telehealth: Payer: Self-pay | Admitting: *Deleted

## 2013-09-16 NOTE — Telephone Encounter (Signed)
Faxed surgical clearance for laparoscopic Hiatal hernia Repair to [facility].

## 2013-09-25 ENCOUNTER — Encounter (HOSPITAL_COMMUNITY): Payer: Self-pay | Admitting: Pharmacy Technician

## 2013-10-02 ENCOUNTER — Encounter (HOSPITAL_COMMUNITY)
Admission: RE | Admit: 2013-10-02 | Discharge: 2013-10-02 | Disposition: A | Discharge status: Home / Self Care | Payer: BC Managed Care – PPO | Source: Ambulatory Visit | Attending: General Surgery | Admitting: General Surgery

## 2013-10-02 ENCOUNTER — Ambulatory Visit (HOSPITAL_COMMUNITY)
Admission: RE | Admit: 2013-10-02 | Discharge: 2013-10-02 | Disposition: A | Discharge status: Home / Self Care | Payer: BC Managed Care – PPO | Source: Ambulatory Visit | Attending: Anesthesiology | Admitting: Anesthesiology

## 2013-10-02 ENCOUNTER — Encounter (HOSPITAL_COMMUNITY): Payer: Self-pay

## 2013-10-02 DIAGNOSIS — Z01812 Encounter for preprocedural laboratory examination: Secondary | ICD-10-CM | POA: Insufficient documentation

## 2013-10-02 DIAGNOSIS — Z01818 Encounter for other preprocedural examination: Secondary | ICD-10-CM | POA: Insufficient documentation

## 2013-10-02 HISTORY — DX: Hypothyroidism, unspecified: E03.9

## 2013-10-02 HISTORY — DX: Headache: R51

## 2013-10-02 HISTORY — DX: Cerebral infarction, unspecified: I63.9

## 2013-10-02 HISTORY — DX: Personal history of other specified conditions: Z87.898

## 2013-10-02 HISTORY — DX: Shortness of breath: R06.02

## 2013-10-02 HISTORY — DX: Unspecified osteoarthritis, unspecified site: M19.90

## 2013-10-02 HISTORY — DX: Personal history of other diseases of the digestive system: Z87.19

## 2013-10-02 LAB — BASIC METABOLIC PANEL
BUN: 15 mg/dL (ref 6–23)
CALCIUM: 9.4 mg/dL (ref 8.4–10.5)
CO2: 30 meq/L (ref 19–32)
Chloride: 99 mEq/L (ref 96–112)
Creatinine, Ser: 1.26 mg/dL (ref 0.50–1.35)
GFR calc Af Amer: 68 mL/min — ABNORMAL LOW (ref >90)
GFR, EST NON AFRICAN AMERICAN: 59 mL/min — AB (ref >90)
GLUCOSE: 87 mg/dL (ref 70–99)
Potassium: 4.2 mEq/L (ref 3.7–5.3)
Sodium: 140 mEq/L (ref 137–147)

## 2013-10-02 LAB — CBC
HEMATOCRIT: 44.5 % (ref 39.0–52.0)
HEMOGLOBIN: 14.8 g/dL (ref 13.0–17.0)
MCH: 28.5 pg (ref 26.0–34.0)
MCHC: 33.3 g/dL (ref 30.0–36.0)
MCV: 85.7 fL (ref 78.0–100.0)
Platelets: 254 10*3/uL (ref 150–400)
RBC: 5.19 MIL/uL (ref 4.22–5.81)
RDW: 13 % (ref 11.5–15.5)
WBC: 7.2 10*3/uL (ref 4.0–10.5)

## 2013-10-02 NOTE — Patient Instructions (Addendum)
[facility]EDUL[facility]  ON:  Thursday  5/7  REPORT TO  SHORT STAY CENTER AT:  6:00 AM      PHONE # FOR SHORT STAY IS [phone]  DO NOT EAT OR DRINK ANYTHING AFTER MIDNIGHT THE NIGHT BEFORE YOUR SURGERY.  YOU MAY BRUSH YOUR TEETH, RINSE OUT YOUR MOUTH--BUT NO WATER, NO FOOD, NO CHEWING GUM, NO MINTS, NO CANDIES, NO CHEWING TOBACCO.  PLEASE TAKE THE FOLLOWING MEDICATIONS THE AM OF YOUR SURGERY WITH A FEW SIPS OF WATER:  AMLODIPINE, SYNTHROID, PROTONIX, AND USE INHALER.  I IF YOU HAVE SLEEP APNEA AND USE CPAP OR BIPAP--PLEASE BRING THE MASK AND THE TUBING.  DO NOT BRING YOUR MACHINE.  DO NOT BRING VALUABLES, MONEY, CREDIT CARDS.  DO NOT WEAR JEWELRY, MAKE-UP, NAIL POLISH AND NO METAL PINS OR CLIPS IN YOUR HAIR. CONTACT LENS, DENTURES / PARTIALS, GLASSES SHOULD NOT BE WORN TO SURGERY AND IN MOST CASES-HEARING AIDS WILL NEED TO BE REMOVED.  BRING YOUR GLASSES CASE, ANY EQUIPMENT NEEDED FOR YOUR CONTACT LENS. FOR PATIENTS ADMITTED TO THE HOSPITAL--CHECK OUT TIME THE DAY OF DISCHARGE IS 11:00 AM.  ALL INPATIENT ROOMS ARE PRIVATE - WITH BATHROOM, TELEPHONE, TELEVISION AND WIFI INTERNET.                                                     PLEASE READ OVER ANY  FACT SHEETS THAT YOU WERE GIVEN: MRSA INFORMATION, BLOOD TRANSFUSION INFORMATION, INCENTIVE SPIROMETER INFORMATION.  PLEASE BE AWARE THAT YOU MAY NEED ADDITIONAL BLOOD DRAWN DAY OF YOUR SURGERY  _______________________________________________________________________   [facility] - Pre[facility] Before surgery, you can play an important role.  Because skin is not sterile, your skin needs to be as free of germs as possible.  You can reduce the number of germs on your skin by washing with CHG (chlorahexidine gluconate) soap before surgery.  CHG is an antiseptic cleaner which kills germs and bonds with the skin to continue killing germs even after washing. Please DO NOT use if you have an allergy to  CHG or antibacterial soaps.  If your skin becomes reddened/irritated stop using the CHG and inform your nurse when you arrive at Short Stay. Do not shave (including legs and underarms) for at least 48 hours prior to the first CHG shower.  You may shave your face. Please follow these instructions carefully:  1.  Shower with CHG Soap the night before surgery and the  morning of Surgery.  2.  If you choose to wash your hair, wash your hair first as usual with your  normal  shampoo.  3.  After you shampoo, rinse your hair and body thoroughly to remove the  shampoo.                           4.  Use CHG as you would any other liquid soap.  You can apply chg directly  to the skin and wash                       Gently with a scrungie or clean washcloth.  5.  Apply the CHG Soap to your body ONLY FROM THE NECK DOWN.   Do not use on open  Wound or open sores. Avoid contact with eyes, ears mouth and genitals (private parts).                        Genitals (private parts) with your normal soap.             6.  Wash thoroughly, paying special attention to the area where your surgery  will be performed.  7.  Thoroughly rinse your body with warm water from the neck down.  8.  DO NOT shower/wash with your normal soap after using and rinsing off  the CHG Soap.                9.  Pat yourself dry with a clean towel.            10.  Wear clean pajamas.            11.  Place clean sheets on your bed the night of your first shower and do not  sleep with pets. Day of Surgery : Do not apply any lotions/deodorants the morning of surgery.  Please wear clean clothes to the hospital/surgery center.  FAILURE TO FOLLOW THESE INSTRUCTIONS MAY RESULT IN THE CANCELLATION OF YOUR SURGERY PATIENT SIGNATURE_________________________________  NURSE SIGNATURE__________________________________  ________________________________________________________________________

## 2013-10-02 NOTE — Pre-Procedure Instructions (Signed)
OFFICE NOTES 07-01-2012 FROM PT'S NEUROLOGIST - DR. Anne HahnWILLIS ON CHART. CARDIOLOGY OFFICE NOTES AND EKG ARE IN EPIC FROM DR. Tresa Endo. KELLY FROM 05/13/13. [facility].

## 2013-10-09 ENCOUNTER — Encounter (HOSPITAL_COMMUNITY): Payer: BC Managed Care – PPO | Admitting: Anesthesiology

## 2013-10-09 ENCOUNTER — Encounter (HOSPITAL_COMMUNITY)
Admission: RE | Disposition: A | Discharge status: Home / Self Care | Payer: Self-pay | Source: Ambulatory Visit | Attending: General Surgery

## 2013-10-09 ENCOUNTER — Encounter (HOSPITAL_COMMUNITY): Payer: Self-pay | Admitting: *Deleted

## 2013-10-09 ENCOUNTER — Inpatient Hospital Stay (HOSPITAL_COMMUNITY)
Admission: RE | Admit: 2013-10-09 | Discharge: 2013-10-10 | DRG: 328 | Disposition: A | Discharge status: Home / Self Care | Payer: BC Managed Care – PPO | Source: Ambulatory Visit | Attending: General Surgery | Admitting: General Surgery

## 2013-10-09 ENCOUNTER — Inpatient Hospital Stay (HOSPITAL_COMMUNITY): Payer: BC Managed Care – PPO | Admitting: Anesthesiology

## 2013-10-09 DIAGNOSIS — Z87891 Personal history of nicotine dependence: Secondary | ICD-10-CM

## 2013-10-09 DIAGNOSIS — Z79899 Other long term (current) drug therapy: Secondary | ICD-10-CM

## 2013-10-09 DIAGNOSIS — E785 Hyperlipidemia, unspecified: Secondary | ICD-10-CM | POA: Diagnosis present

## 2013-10-09 DIAGNOSIS — K2100 Gastro-esophageal reflux disease with esophagitis, without bleeding: Secondary | ICD-10-CM

## 2013-10-09 DIAGNOSIS — K219 Gastro-esophageal reflux disease without esophagitis: Secondary | ICD-10-CM | POA: Diagnosis present

## 2013-10-09 DIAGNOSIS — I714 Abdominal aortic aneurysm, without rupture, unspecified: Secondary | ICD-10-CM | POA: Diagnosis present

## 2013-10-09 DIAGNOSIS — Z9861 Coronary angioplasty status: Secondary | ICD-10-CM

## 2013-10-09 DIAGNOSIS — E039 Hypothyroidism, unspecified: Secondary | ICD-10-CM | POA: Diagnosis present

## 2013-10-09 DIAGNOSIS — K449 Diaphragmatic hernia without obstruction or gangrene: Secondary | ICD-10-CM

## 2013-10-09 DIAGNOSIS — J449 Chronic obstructive pulmonary disease, unspecified: Secondary | ICD-10-CM | POA: Diagnosis present

## 2013-10-09 DIAGNOSIS — Z9889 Other specified postprocedural states: Secondary | ICD-10-CM

## 2013-10-09 DIAGNOSIS — I252 Old myocardial infarction: Secondary | ICD-10-CM

## 2013-10-09 DIAGNOSIS — Z6834 Body mass index (BMI) 34.0-34.9, adult: Secondary | ICD-10-CM

## 2013-10-09 DIAGNOSIS — R413 Other amnesia: Secondary | ICD-10-CM | POA: Diagnosis present

## 2013-10-09 DIAGNOSIS — Z7982 Long term (current) use of aspirin: Secondary | ICD-10-CM

## 2013-10-09 DIAGNOSIS — Z8673 Personal history of transient ischemic attack (TIA), and cerebral infarction without residual deficits: Secondary | ICD-10-CM

## 2013-10-09 DIAGNOSIS — I739 Peripheral vascular disease, unspecified: Secondary | ICD-10-CM | POA: Diagnosis present

## 2013-10-09 DIAGNOSIS — E669 Obesity, unspecified: Secondary | ICD-10-CM | POA: Diagnosis present

## 2013-10-09 DIAGNOSIS — I10 Essential (primary) hypertension: Secondary | ICD-10-CM | POA: Diagnosis present

## 2013-10-09 DIAGNOSIS — R1310 Dysphagia, unspecified: Secondary | ICD-10-CM | POA: Diagnosis present

## 2013-10-09 DIAGNOSIS — G4730 Sleep apnea, unspecified: Secondary | ICD-10-CM | POA: Diagnosis present

## 2013-10-09 DIAGNOSIS — J4489 Other specified chronic obstructive pulmonary disease: Secondary | ICD-10-CM | POA: Diagnosis present

## 2013-10-09 DIAGNOSIS — R112 Nausea with vomiting, unspecified: Secondary | ICD-10-CM | POA: Diagnosis present

## 2013-10-09 SURGERY — ROBOTIC ASSISTED LAPAROSCOPIC NISSEN FUNDOPLICATION
Anesthesia: General | Site: Abdomen

## 2013-10-09 MED ORDER — ROCURONIUM BROMIDE 100 MG/10ML IV SOLN
INTRAVENOUS | Status: DC | PRN
  Administered 2013-10-09: 10 mg via INTRAVENOUS
  Administered 2013-10-09: 20 mg via INTRAVENOUS
  Administered 2013-10-09: 10 mg via INTRAVENOUS
  Administered 2013-10-09: 5 mg via INTRAVENOUS
  Administered 2013-10-09: 45 mg via INTRAVENOUS

## 2013-10-09 MED ORDER — ROCURONIUM BROMIDE 100 MG/10ML IV SOLN
INTRAVENOUS | Status: AC
  Filled 2013-10-09: qty 1

## 2013-10-09 MED ORDER — OXYCODONE HCL 5 MG PO TABS: 5.0000 mg | ORAL_TABLET | Freq: Once | ORAL | Status: DC | PRN

## 2013-10-09 MED ORDER — ONDANSETRON HCL 4 MG/2ML IJ SOLN
INTRAMUSCULAR | Status: AC
  Filled 2013-10-09: qty 2

## 2013-10-09 MED ORDER — MIDAZOLAM HCL 2 MG/2ML IJ SOLN
INTRAMUSCULAR | Status: AC
  Filled 2013-10-09: qty 2

## 2013-10-09 MED ORDER — PROPOFOL 10 MG/ML IV BOLUS
INTRAVENOUS | Status: DC | PRN
  Administered 2013-10-09: 180 mg via INTRAVENOUS

## 2013-10-09 MED ORDER — GLYCOPYRROLATE 0.2 MG/ML IJ SOLN
INTRAMUSCULAR | Status: AC
  Filled 2013-10-09: qty 3

## 2013-10-09 MED ORDER — CHLORHEXIDINE GLUCONATE 4 % EX LIQD: 1.0000 "application " | Freq: Once | CUTANEOUS | Status: DC

## 2013-10-09 MED ORDER — HYDROMORPHONE HCL PF 1 MG/ML IJ SOLN
1.0000 mg | INTRAMUSCULAR | Status: DC | PRN
  Administered 2013-10-09 (×2): 1 mg via INTRAVENOUS
  Filled 2013-10-09 (×2): qty 1

## 2013-10-09 MED ORDER — HYDROMORPHONE HCL PF 2 MG/ML IJ SOLN
INTRAMUSCULAR | Status: AC
  Filled 2013-10-09: qty 1

## 2013-10-09 MED ORDER — MIDAZOLAM HCL 5 MG/5ML IJ SOLN
INTRAMUSCULAR | Status: DC | PRN
  Administered 2013-10-09: 2 mg via INTRAVENOUS

## 2013-10-09 MED ORDER — HYDROMORPHONE HCL PF 1 MG/ML IJ SOLN: 0.2500 mg | INTRAMUSCULAR | Status: DC | PRN

## 2013-10-09 MED ORDER — PROPOFOL 10 MG/ML IV BOLUS
INTRAVENOUS | Status: AC
  Filled 2013-10-09: qty 20

## 2013-10-09 MED ORDER — DEXAMETHASONE SODIUM PHOSPHATE 10 MG/ML IJ SOLN
INTRAMUSCULAR | Status: AC
  Filled 2013-10-09: qty 1

## 2013-10-09 MED ORDER — NEOSTIGMINE METHYLSULFATE 10 MG/10ML IV SOLN
INTRAVENOUS | Status: AC
  Filled 2013-10-09: qty 1

## 2013-10-09 MED ORDER — PHENYLEPHRINE 40 MCG/ML (10ML) SYRINGE FOR IV PUSH (FOR BLOOD PRESSURE SUPPORT)
PREFILLED_SYRINGE | INTRAVENOUS | Status: AC
  Filled 2013-10-09: qty 10

## 2013-10-09 MED ORDER — LACTATED RINGERS IR SOLN
Status: DC | PRN
  Administered 2013-10-09: 1000 mL

## 2013-10-09 MED ORDER — MEPERIDINE HCL 50 MG/ML IJ SOLN: 6.2500 mg | INTRAMUSCULAR | Status: DC | PRN

## 2013-10-09 MED ORDER — LIDOCAINE HCL (CARDIAC) 20 MG/ML IV SOLN
INTRAVENOUS | Status: AC
  Filled 2013-10-09: qty 5

## 2013-10-09 MED ORDER — BUPIVACAINE-EPINEPHRINE 0.25% -1:200000 IJ SOLN
INTRAMUSCULAR | Status: DC | PRN
  Administered 2013-10-09: 10 mL

## 2013-10-09 MED ORDER — GLYCOPYRROLATE 0.2 MG/ML IJ SOLN
INTRAMUSCULAR | Status: DC | PRN
  Administered 2013-10-09: 0.6 mg via INTRAVENOUS

## 2013-10-09 MED ORDER — DIPHENHYDRAMINE HCL 50 MG/ML IJ SOLN
12.5000 mg | Freq: Every evening | INTRAMUSCULAR | Status: DC | PRN
  Administered 2013-10-09: 12.5 mg via INTRAVENOUS
  Filled 2013-10-09: qty 1

## 2013-10-09 MED ORDER — TISSEEL VH 10 ML EX KIT
PACK | CUTANEOUS | Status: AC
  Filled 2013-10-09: qty 1

## 2013-10-09 MED ORDER — PHENYLEPHRINE HCL 10 MG/ML IJ SOLN
INTRAMUSCULAR | Status: DC | PRN
  Administered 2013-10-09 (×2): 80 ug via INTRAVENOUS

## 2013-10-09 MED ORDER — HYDROMORPHONE HCL PF 1 MG/ML IJ SOLN
INTRAMUSCULAR | Status: DC | PRN
  Administered 2013-10-09: 1 mg via INTRAVENOUS
  Administered 2013-10-09: 0.5 mg via INTRAVENOUS
  Administered 2013-10-09: .5 mg via INTRAVENOUS

## 2013-10-09 MED ORDER — 0.9 % SODIUM CHLORIDE (POUR BTL) OPTIME
TOPICAL | Status: DC | PRN
  Administered 2013-10-09: 1000 mL

## 2013-10-09 MED ORDER — DEXAMETHASONE SODIUM PHOSPHATE 10 MG/ML IJ SOLN
INTRAMUSCULAR | Status: DC | PRN
  Administered 2013-10-09: 10 mg via INTRAVENOUS

## 2013-10-09 MED ORDER — LIDOCAINE HCL (CARDIAC) 20 MG/ML IV SOLN
INTRAVENOUS | Status: DC | PRN
  Administered 2013-10-09: 50 mg via INTRAVENOUS

## 2013-10-09 MED ORDER — FENTANYL CITRATE 0.05 MG/ML IJ SOLN
INTRAMUSCULAR | Status: DC | PRN
  Administered 2013-10-09 (×3): 50 ug via INTRAVENOUS
  Administered 2013-10-09: 100 ug via INTRAVENOUS

## 2013-10-09 MED ORDER — DEXTROSE-NACL 5-0.9 % IV SOLN
INTRAVENOUS | Status: DC
  Administered 2013-10-09 – 2013-10-10 (×2): via INTRAVENOUS

## 2013-10-09 MED ORDER — FENTANYL CITRATE 0.05 MG/ML IJ SOLN
INTRAMUSCULAR | Status: AC
  Filled 2013-10-09: qty 5

## 2013-10-09 MED ORDER — NEOSTIGMINE METHYLSULFATE 10 MG/10ML IV SOLN
INTRAVENOUS | Status: DC | PRN
  Administered 2013-10-09: 5 mg via INTRAVENOUS

## 2013-10-09 MED ORDER — OXYCODONE HCL 5 MG/5ML PO SOLN
5.0000 mg | Freq: Once | ORAL | Status: DC | PRN
  Filled 2013-10-09: qty 5

## 2013-10-09 MED ORDER — ONDANSETRON HCL 4 MG/2ML IJ SOLN
4.0000 mg | INTRAMUSCULAR | Status: DC
  Administered 2013-10-09 – 2013-10-10 (×6): 4 mg via INTRAVENOUS
  Filled 2013-10-09 (×6): qty 2

## 2013-10-09 MED ORDER — ONDANSETRON HCL 4 MG/2ML IJ SOLN
INTRAMUSCULAR | Status: DC | PRN
  Administered 2013-10-09: 4 mg via INTRAVENOUS

## 2013-10-09 MED ORDER — LACTATED RINGERS IV SOLN: INTRAVENOUS | Status: DC

## 2013-10-09 MED ORDER — SUCCINYLCHOLINE CHLORIDE 20 MG/ML IJ SOLN
INTRAMUSCULAR | Status: DC | PRN
  Administered 2013-10-09: 120 mg via INTRAVENOUS

## 2013-10-09 MED ORDER — BUPIVACAINE-EPINEPHRINE (PF) 0.25% -1:200000 IJ SOLN
INTRAMUSCULAR | Status: AC
  Filled 2013-10-09: qty 30

## 2013-10-09 MED ORDER — SODIUM CHLORIDE 0.9 % IV SOLN
1500.0000 mg | INTRAVENOUS | Status: AC
  Administered 2013-10-09: 1500 mg via INTRAVENOUS
  Filled 2013-10-09: qty 1500

## 2013-10-09 MED ORDER — PROMETHAZINE HCL 25 MG/ML IJ SOLN: 6.2500 mg | INTRAMUSCULAR | Status: DC | PRN

## 2013-10-09 MED ORDER — LACTATED RINGERS IV SOLN
INTRAVENOUS | Status: DC | PRN
  Administered 2013-10-09 (×3): via INTRAVENOUS

## 2013-10-09 MED ORDER — ONDANSETRON HCL 4 MG/2ML IJ SOLN
4.0000 mg | Freq: Four times a day (QID) | INTRAMUSCULAR | Status: DC | PRN
  Filled 2013-10-09: qty 2

## 2013-10-09 SURGICAL SUPPLY — 89 items
ADH SKN CLS APL DERMABOND .7 (GAUZE/BANDAGES/DRESSINGS) ×1
APL SKNCLS STERI-STRIP NONHPOA (GAUZE/BANDAGES/DRESSINGS)
APPLIER CLIP 5 13 M/L LIGAMAX5 (MISCELLANEOUS)
APPLIER CLIP ROT 10 11.4 M/L (STAPLE)
APR CLP MED LRG 11.4X10 (STAPLE)
APR CLP MED LRG 5 ANG JAW (MISCELLANEOUS)
BENZOIN TINCTURE PRP APPL 2/3 (GAUZE/BANDAGES/DRESSINGS) IMPLANT
BLADE SURG 15 STRL LF DISP TIS (BLADE) ×1 IMPLANT
BLADE SURG 15 STRL SS (BLADE) ×2
BLADE SURG SZ11 CARB STEEL (BLADE) ×2 IMPLANT
CATH FOLEY LATEX FREE 16FR (CATHETERS) ×1 IMPLANT
CHLORAPREP W/TINT 26ML (MISCELLANEOUS) ×2 IMPLANT
CLIP APPLIE 5 13 M/L LIGAMAX5 (MISCELLANEOUS) IMPLANT
CLIP APPLIE ROT 10 11.4 M/L (STAPLE) IMPLANT
CLIP LIGATING HEM O LOK PURPLE (MISCELLANEOUS) IMPLANT
CLIP LIGATING HEMO O LOK GREEN (MISCELLANEOUS) IMPLANT
COVER TIP SHEARS 8 DVNC (MISCELLANEOUS) ×1 IMPLANT
COVER TIP SHEARS 8MM DA VINCI (MISCELLANEOUS) ×1
DECANTER SPIKE VIAL GLASS SM (MISCELLANEOUS) ×2 IMPLANT
DERMABOND ADVANCED (GAUZE/BANDAGES/DRESSINGS) ×1
DERMABOND ADVANCED .7 DNX12 (GAUZE/BANDAGES/DRESSINGS) IMPLANT
DEVICE PMI PUNCTURE CLOSURE (MISCELLANEOUS) IMPLANT
DEVICE TROCAR PUNCTURE CLOSURE (ENDOMECHANICALS) IMPLANT
DISSECTOR BLUNT TIP ENDO 5MM (MISCELLANEOUS) IMPLANT
DRAIN PENROSE .75X.25X12 SILI (WOUND CARE) ×1 IMPLANT
DRAIN PENROSE 18X1/2 LTX STRL (DRAIN) IMPLANT
DRAPE ARM DVNC X/XI (DISPOSABLE) ×4 IMPLANT
DRAPE COLUMN DVNC XI (DISPOSABLE) ×1 IMPLANT
DRAPE DA VINCI XI ARM (DISPOSABLE) ×4
DRAPE DA VINCI XI COLUMN (DISPOSABLE) ×1
ELECT REM PT RETURN 9FT ADLT (ELECTROSURGICAL) ×2
ELECTRODE REM PT RTRN 9FT ADLT (ELECTROSURGICAL) ×1 IMPLANT
ENDOLOOP SUT PDS II  0 18 (SUTURE) ×1
ENDOLOOP SUT PDS II 0 18 (SUTURE) IMPLANT
GAUZE SPONGE 2X2 8PLY STRL LF (GAUZE/BANDAGES/DRESSINGS) IMPLANT
GAUZE SPONGE 4X4 16PLY XRAY LF (GAUZE/BANDAGES/DRESSINGS) ×2 IMPLANT
GLOVE BIO SURGEON STRL SZ7.5 (GLOVE) ×1 IMPLANT
GLOVE BIOGEL PI IND STRL 7.5 (GLOVE) IMPLANT
GLOVE BIOGEL PI IND STRL 8 (GLOVE) IMPLANT
GLOVE BIOGEL PI INDICATOR 7.5 (GLOVE) ×1
GLOVE BIOGEL PI INDICATOR 8 (GLOVE) ×1
GLOVE SURG SS PI 6.5 STRL IVOR (GLOVE) ×2 IMPLANT
GLOVE SURG SS PI 7.0 STRL IVOR (GLOVE) ×2 IMPLANT
GLOVE SURG SS PI 7.5 STRL IVOR (GLOVE) ×2 IMPLANT
GLOVE SURG SS PI 8.0 STRL IVOR (GLOVE) ×1 IMPLANT
GOWN STRL REUS W/TWL XL LVL3 (GOWN DISPOSABLE) ×8 IMPLANT
KIT BASIN OR (CUSTOM PROCEDURE TRAY) ×2 IMPLANT
LEGGING LITHOTOMY PAIR STRL (DRAPES) ×2 IMPLANT
MANIFOLD NEPTUNE II (INSTRUMENTS) ×2 IMPLANT
NDL INSUFFLATION 14GA 120MM (NEEDLE) ×1 IMPLANT
NEEDLE HYPO 22GX1.5 SAFETY (NEEDLE) ×2 IMPLANT
NEEDLE INSUFFLATION 14GA 120MM (NEEDLE) ×2 IMPLANT
PACK CARDIOVASCULAR III (CUSTOM PROCEDURE TRAY) ×2 IMPLANT
PEN SKIN MARKING BROAD (MISCELLANEOUS) ×1 IMPLANT
RELOAD STAPLE 4.0 BLU F/HERNIA (INSTRUMENTS) IMPLANT
RELOAD STAPLE 4.8 BLK F/HERNIA (STAPLE) IMPLANT
RELOAD STAPLE HERNIA 4.0 BLUE (INSTRUMENTS) IMPLANT
RELOAD STAPLE HERNIA 4.8 BLK (STAPLE) IMPLANT
SCISSORS LAP 5X35 DISP (ENDOMECHANICALS) IMPLANT
SEAL CANN UNIV 5-8 DVNC XI (MISCELLANEOUS) ×4 IMPLANT
SEAL XI 5MM-8MM UNIVERSAL (MISCELLANEOUS) ×4
SEALER VESSEL DA VINCI XI (MISCELLANEOUS) ×1
SEALER VESSEL EXT DVNC XI (MISCELLANEOUS) IMPLANT
SET IRRIG TUBING LAPAROSCOPIC (IRRIGATION / IRRIGATOR) ×2 IMPLANT
SHEARS HARMONIC ACE PLUS 36CM (ENDOMECHANICALS) IMPLANT
SLEEVE XCEL OPT CAN 5 100 (ENDOMECHANICALS) IMPLANT
SOLUTION ANTI FOG 6CC (MISCELLANEOUS) ×2 IMPLANT
SOLUTION ELECTROLUBE (MISCELLANEOUS) ×2 IMPLANT
SPONGE GAUZE 2X2 STER 10/PKG (GAUZE/BANDAGES/DRESSINGS)
STAPLER HERNIA 12 8.5 360D (INSTRUMENTS) IMPLANT
STAPLER VISISTAT 35W (STAPLE) IMPLANT
STRIP CLOSURE SKIN 1/2X4 (GAUZE/BANDAGES/DRESSINGS) IMPLANT
SUT ETHIBOND 0 36 GRN (SUTURE) ×1 IMPLANT
SUT ETHIBOND 2 0 SH (SUTURE)
SUT ETHIBOND 2 0 SH 36X2 (SUTURE) IMPLANT
SUT MNCRL AB 4-0 PS2 18 (SUTURE) ×3 IMPLANT
SUT SILK 2 0 SH (SUTURE) ×7 IMPLANT
SYR 30ML LL (SYRINGE) ×1 IMPLANT
SYR CONTROL 10ML LL (SYRINGE) ×2 IMPLANT
TIP INNERVISION DETACH 40FR (MISCELLANEOUS) IMPLANT
TIP INNERVISION DETACH 50FR (MISCELLANEOUS) IMPLANT
TIP INNERVISION DETACH 56FR (MISCELLANEOUS) IMPLANT
TIPS INNERVISION DETACH 40FR (MISCELLANEOUS)
TOWEL OR 17X26 10 PK STRL BLUE (TOWEL DISPOSABLE) ×2 IMPLANT
TOWEL OR NON WOVEN STRL DISP B (DISPOSABLE) ×2 IMPLANT
TRAY FOLEY CATH 14FRSI W/METER (CATHETERS) ×1 IMPLANT
TROCAR BLADELESS OPT 5 100 (ENDOMECHANICALS) ×2 IMPLANT
TROCAR XCEL 12X100 BLDLESS (ENDOMECHANICALS) IMPLANT
TUBING FILTER THERMOFLATOR (ELECTROSURGICAL) ×2 IMPLANT

## 2013-10-09 NOTE — Brief Op Note (Signed)
10/09/2013  11:17 AM  PATIENT:  Matthew Evans  63 y.o. male  PRE-OPERATIVE DIAGNOSIS:  hiatal hernia, GERD    POST-OPERATIVE DIAGNOSIS:  Small hiatal hernia, GERD  PROCEDURE:  Procedure(s): ROBOTIC LAPAROSCOPIC NISSEN FUNDOPLICATION (N/A)  SURGEON:  Surgeon(s) and Role:    * Ardeth SportsmanSteven C. Gross, MD - Assisting    * Axel FillerArmando Edge Mauger, MD - Primary    * Romie LeveeAlicia Thomas, MD - Assisting    ANESTHESIA:   local and general  EBL:  Total I/O In: 2000 [I.V.:2000] Out: 250 [Urine:200; Blood:50]  BLOOD ADMINISTERED:none  DRAINS: none   LOCAL MEDICATIONS USED:  BUPIVICAINE   SPECIMEN:  No Specimen  DISPOSITION OF SPECIMEN:  N/A  COUNTS:  YES  TOURNIQUET:  * No tourniquets in log *  DICTATION: .Dragon Dictation She was taken back to the operating room and placed in the lithotomy position with bilateral SCDs in place. The patient was prepped and draped in the usual sterile fashion. After appropriate antibiotics were confirmed a timeout was called and all facts were verified.   A Veress needle technique was used to insufflate the abdomen to 15 mm of mercury the paramedian stab incision. Subsequent to this an 8 mm trocar was introduced as was a 8 millimeter camera. At this time the subsequent robotic trochars were then placed adjacent to this trocar approximately 8-10 cm away. Each trocar was used under direct visualization, there were total of 4 trochars. Assistant trocar was then placed in the right lower quadrant under direct visualization. A 12 mm trocar. The Nathanson retractor was then visualized inserted into the abdomen and the incision just to the left of the falciform ligament. This was then placed to retract the liver appropriately. At this time the patient was positioned in reverse Trendelenburg and left side tilt.  At this time the robot patient cart was in good position and the arms were docked to the trochars appropriately.  At this time proceeded to mobilize the stomach inferiorly  and visualize the right crus. The peritoneum over the right crus was incised to the right crus was identified. Proceed dissect this inferiorly. It should be noted that the hepatic gastric ligament was opened and there was one vagal branch which was sacrificed. Once the right crus was adequately dissected we turned our to the left crus which was dissected away. This required tract of the stomach to the right side. Once this was visualized we then proceeded to circumferentially dissect the esophagus away from the surrounding tissue. At this time a Penrose drain was placed around the esophagus to help with retraction. At this time the phrenoesophageal fat pad was dissected away from the esophagus. There was minimal hiatal hernia seen in this area. I mobilized the esophagus cephalad approximately 3-4 cm, harvesting the surrounding tissue.   At this time we turned our attention to the greater curvature the stomach and the omentum was harvested using the robotic vessel sealer. This was taken up to the greater curvature to the hiatus. This mobilized the entire greater curvature to allow mobilization and the wrap. I then proceeded to bring the greater curvature the stomach posterior to the esophagus a shoeshine technique was used to evaluate the mobilization of the greater curvature.   At this time I proceeded to close the hiatus using 2 interrupted 2-0 silk stitches in interrupted fashion. This brought together the hiatal closure without undue stricture to the esophagus.  At this time the greater curvature was brought around the esophagus and sutured using  2-0 silk sutures interrupted fashion approximately 1 cm apart x3. The middle suture was sutured to the esophagus.  A left collar stitch was then used to gastropexy the stomach from the wrap to the diaphragm just lateral to the left crus. The wrap lay at approximately 11:00.  At this time the robot was undocked. The total liver trocar was removed and the fascia  was reapproximated using an Endo Close device and an 0 Vicryl in interrupted fashion x1. At this time insufflation was evacuated. Skin was reapproximated for Monocryl subcuticular fashion. The skin was then dressed with Dermabond. The patient tolerated the procedure well and was taken to the recovery room in stable condition.     PLAN OF CARE: Admit to inpatient   PATIENT DISPOSITION:  PACU - hemodynamically stable.   Delay start of Pharmacological VTE agent (>24hrs) due to surgical blood loss or risk of bleeding: yes

## 2013-10-09 NOTE — Anesthesia Preprocedure Evaluation (Addendum)
Anesthesia Evaluation  Patient identified by MRN, date of birth, ID band Patient awake    Reviewed: Allergy & Precautions, H&P , NPO status , Patient's Chart, lab work & pertinent test results, reviewed documented beta blocker date and time   Airway Mallampati: II TM Distance: >3 FB Neck ROM: Full    Dental  (+) Dental Advisory Given   Pulmonary shortness of breath, sleep apnea , COPDformer smoker,  breath sounds clear to auscultation        Cardiovascular hypertension, Pt. on medications and Pt. on home beta blockers + CAD, + Past MI, + Cardiac Stents, + Peripheral Vascular Disease and + DOE Rhythm:Regular Rate:Normal     Neuro/Psych  Headaches, TIAnegative psych ROS   GI/Hepatic Neg liver ROS, hiatal hernia, GERD-  Medicated,  Endo/Other  Hypothyroidism   Renal/GU negative Renal ROS     Musculoskeletal negative musculoskeletal ROS (+)   Abdominal (+) + obese,   Peds  Hematology negative hematology ROS (+)   Anesthesia Other Findings   Reproductive/Obstetrics                           Anesthesia Physical Anesthesia Plan  ASA: III  Anesthesia Plan: General   Post-op Pain Management:    Induction: Intravenous  Airway Management Planned: Oral ETT  Additional Equipment:   Intra-op Plan:   Post-operative Plan: Extubation in OR  Informed Consent: I have reviewed the patients History and Physical, chart, labs and discussed the procedure including the risks, benefits and alternatives for the proposed anesthesia with the patient or authorized representative who has indicated his/her understanding and acceptance.   Dental advisory given  Plan Discussed with: CRNA  Anesthesia Plan Comments:         Anesthesia Quick Evaluation

## 2013-10-09 NOTE — Transfer of Care (Signed)
Immediate Anesthesia Transfer of Care Note  Patient: Matthew Evans  Procedure(s) Performed: Procedure(s): ROBOTIC LAPAROSCOPIC NISSEN FUNDOPLICATION (N/A)  Patient Location: PACU  Anesthesia Type:General  Level of Consciousness: awake, alert  and oriented  Airway & Oxygen Therapy: Patient Spontanous Breathing and Patient connected to face mask oxygen  Post-op Assessment: Report given to PACU RN and Post -op Vital signs reviewed and stable  Post vital signs: Reviewed and stable  Complications: No apparent anesthesia complications

## 2013-10-09 NOTE — H&P (Signed)
HPI  Matthew Evans is a 63 y.o. male. The patient is a 63 year old male who is referred by Dr. Elnoria HowardHung for evaluation of dysphagia. Patient said a 6-8 month history of nausea and emesis. The patient's previously been treated for GERD. Patient has undergone a swallowing evaluation as well as an EGD which revealed a small hiatal hernia approximately 3 cm. Patient underwent an esophageal manometry results are currently pending.  Of note the patient had a previous history of cardiac stent and see Dr. Nicholaus BloomKelley. Patient also been seen by Dr. Marilu Favrelarence pulmonologist, for his COPD and dyspnea on exertion. The patient also has a triple-A which is approximately 3.4 x 3.7 cm as per ultrasound 1 year ago.  HPI  Past Medical History   Diagnosis  Date   .  Myocardial infarction  09/2007     PCI to RCA   .  Carotid artery occlusion    .  AAA (abdominal aortic aneurysm)      mild; doppler 08/29/12-3.4x3.7   .  Thyroid disease      hypothyroidism   .  Emphysema    .  Mini stroke    .  Sleep apnea      sleep study 12/14/06 ahi 16.89, during rem 12.3, supine ahi 57.49; cpap 02/22/07-c-flex of 3 at 13cm h2o   .  Lupus    .  Memory loss      d/t mini stroke   .  CAD (coronary artery disease)      echo 07/26/12-EF 55-60%; myoview 07/26/12-low normal wall motion with mild apical hypokinesis as well a septal wall motion consistent with LBBB   .  HTN (hypertension)    .  Hyperlipidemia    .  GERD (gastroesophageal reflux disease)     Past Surgical History   Procedure  Laterality  Date   .  Coronary stent placement   09/2007     right   .  Balloon atherotomy       LAD   .  Cardiac catheterization   08/11/11     EF50-55%, small AAA, no signifiant stent restenosis in RCA , no restenosis of PTCA of LAD, medical therapy   .  Cardiac catheterization   09/27/06     cutting balloon arthrotomy of LAD stenosis with dilatation of a 3.5x3815mm cutting balloon   .  Coronary angioplasty with stent placement   09/24/06     PTCA/stent of  RCA with a 3.5x3520mm Taxus DES post dilated to 3.5796mm   .  Cardiac catheterization   01/07/09     50-60% progressive dz beyond the stented segment in the RCA , LAD intervention remained patent   .  Esophageal manometry  N/A  08/04/2013     Procedure: ESOPHAGEAL MANOMETRY (EM); Surgeon: Theda BelfastPatrick D Hung, MD; Location: [facility]; Service: Endoscopy; Laterality: N/A;    Family History   Problem  Relation  Age of Onset   .  Emphysema  Mother    .  Heart disease  Father    .  Mesothelioma  Father    .  Heart attack  Father    Social History  History   Substance Use Topics   .  Smoking status:  Former Smoker -- 2.00 packs/day for 45 years     Types:  Cigarettes     Quit date:  02/04/2010   .  Smokeless tobacco:  Not on file   .  Alcohol Use:  No      Comment:  QUIT 1994    Allergies   Allergen  Reactions   .  Latex    .  Penicillins    .  Septra [Sulfamethoxazole-Trimethoprim]    .  Zocor [Simvastatin]    .  Niaspan [Niacin]  Rash   .  Simcor [Niacin-Simvastatin Er]  Rash    Current Outpatient Prescriptions   Medication  Sig  Dispense  Refill   .  amLODipine (NORVASC) 5 MG tablet  TAKE 1 TABLET BY MOUTH EVERY DAY  30 tablet  6   .  aspirin EC 81 MG tablet  Take 81 mg by mouth daily.     .  clopidogrel (PLAVIX) 75 MG tablet  Take 1 tablet (75 mg total) by mouth daily.  90 tablet  2   .  hydrochlorothiazide (MICROZIDE) 12.5 MG capsule  Take 1 capsule (12.5 mg total) by mouth every other day.  30 capsule  3   .  hydroxychloroquine (PLAQUENIL) 200 MG tablet  Take 2 tablets by mouth daily.     .  isosorbide mononitrate (IMDUR) 60 MG 24 hr tablet  Take 1 tablet (60 mg total) by mouth daily.  30 tablet  5   .  levothyroxine (SYNTHROID, LEVOTHROID) 150 MCG tablet  Take 150 mcg by mouth daily.     Marland Kitchen  losartan (COZAAR) 50 MG tablet  Take 1 tablet (50 mg total) by mouth daily.  90 tablet  3   .  metoprolol succinate (TOPROL XL) 25 MG 24 hr tablet  Take 1/2 to 1 tablet by mouth daily.  90 tablet  3    .  omeprazole (PRILOSEC OTC) 20 MG tablet  Take 20 mg by mouth daily.     .  pantoprazole (PROTONIX) 40 MG tablet  Take 1 tablet (40 mg total) by mouth daily.  90 tablet  2   .  Umeclidinium-Vilanterol (ANORO ELLIPTA) 62.5-25 MCG/INH AEPB  Inhale 1 puff into the lungs daily.  30 each  6    No current facility-administered medications for this visit.   Review of Systems  Review of Systems  Constitutional: Negative.  HENT: Negative.  Eyes: Negative.  Respiratory: Negative.  Cardiovascular: Negative.  Gastrointestinal: Negative.  Endocrine: Negative.  Neurological: Negative.  Blood pressure 136/80, pulse 78, temperature 97.5 F (36.4 C), temperature source Oral, resp. rate 20, height 5\' 8"  (1.727 m), weight 218 lb (98.884 kg).  Physical Exam  Physical Exam  Constitutional: He is oriented to person, place, and time. He appears well-developed and well-nourished.  HENT:  Head: Normocephalic and atraumatic.  Eyes: Conjunctivae and EOM are normal. Pupils are equal, round, and reactive to light.  Neck: Normal range of motion. Neck supple.  Cardiovascular: Normal rate, regular rhythm and normal heart sounds.  Pulmonary/Chest: Effort normal and breath sounds normal.  Abdominal: Soft. Bowel sounds are normal. He exhibits no distension and no mass. There is no tenderness. There is no rebound and no guarding.  Musculoskeletal: Normal range of motion.  Neurological: He is alert and oriented to person, place, and time.  Skin: Skin is warm and dry.  Data Reviewed  As above  Assessment  63 year old male with dysphagia and hiatal hernia. Patient underwent manometry studies which are currently pending to rule out any type of achalasia or other esophageal dysmotility issues.  Plan  1. We will proceed with robotic nissen fundoplication 2. I discussed the risks and benefits of the procedure to include but not limited to: Infection, bleeding , damage to surrounding structures,  possible leak of  esophagus, and possible need for further surgery.  The patient voiced understanding and wishes to proceed.

## 2013-10-09 NOTE — Progress Notes (Signed)
Post op Check  Pt doing well laying in bed.  Asking if he's ready to go home.  I d/w him the need for IS use and pain Rx use. Awaiting Esophgogram tomorrow and he's hopeful he'll be able to go home tomorrow.  OK for ambulation

## 2013-10-09 NOTE — Anesthesia Postprocedure Evaluation (Signed)
Anesthesia Post Note  Patient: Matthew Evans  Procedure(s) Performed: Procedure(s) (LRB): ROBOTIC LAPAROSCOPIC NISSEN FUNDOPLICATION (N/A)  Anesthesia type: General  Patient location: PACU  Post pain: Pain level controlled  Post assessment: Post-op Vital signs reviewed  Last Vitals: BP 150/66  Pulse 55  Temp(Src) 36.8 C (Oral)  Resp 16  Ht 5\' 8"  (1.727 m)  Wt 226 lb 3.2 oz (102.604 kg)  BMI 34.40 kg/m2  SpO2 99%  Post vital signs: Reviewed  Level of consciousness: sedated  Complications: No apparent anesthesia complications

## 2013-10-10 ENCOUNTER — Inpatient Hospital Stay (HOSPITAL_COMMUNITY): Payer: BC Managed Care – PPO

## 2013-10-10 ENCOUNTER — Telehealth (INDEPENDENT_AMBULATORY_CARE_PROVIDER_SITE_OTHER): Payer: Self-pay

## 2013-10-10 MED ORDER — PANTOPRAZOLE SODIUM 40 MG PO TBEC: 40.0000 mg | DELAYED_RELEASE_TABLET | Freq: Every day | ORAL | Status: DC

## 2013-10-10 MED ORDER — AMLODIPINE BESYLATE 5 MG PO TABS
5.0000 mg | ORAL_TABLET | Freq: Every day | ORAL | Status: DC
  Filled 2013-10-10: qty 1

## 2013-10-10 MED ORDER — HYDROCODONE-ACETAMINOPHEN 7.5-325 MG/15ML PO SOLN: 10.0000 mL | ORAL | Status: DC | PRN

## 2013-10-10 MED ORDER — METOPROLOL SUCCINATE ER 25 MG PO TB24
25.0000 mg | ORAL_TABLET | Freq: Every day | ORAL | Status: DC
  Filled 2013-10-10: qty 1

## 2013-10-10 MED ORDER — ISOSORBIDE MONONITRATE ER 60 MG PO TB24
90.0000 mg | ORAL_TABLET | Freq: Every day | ORAL | Status: DC
  Filled 2013-10-10: qty 1

## 2013-10-10 MED ORDER — OMEGA-3-ACID ETHYL ESTERS 1 G PO CAPS
1.0000 g | ORAL_CAPSULE | Freq: Two times a day (BID) | ORAL | Status: DC
  Filled 2013-10-10: qty 1

## 2013-10-10 MED ORDER — IOHEXOL 300 MG/ML  SOLN
300.0000 mL | Freq: Once | INTRAMUSCULAR | Status: AC | PRN
  Administered 2013-10-10: 200 mL via ORAL

## 2013-10-10 MED ORDER — LEVOTHYROXINE SODIUM 175 MCG PO TABS
175.0000 ug | ORAL_TABLET | Freq: Every day | ORAL | Status: DC
  Filled 2013-10-10: qty 1

## 2013-10-10 MED ORDER — UMECLIDINIUM-VILANTEROL 62.5-25 MCG/INH IN AEPB: 1.0000 | INHALATION_SPRAY | Freq: Every day | RESPIRATORY_TRACT | Status: DC

## 2013-10-10 MED ORDER — HYDROCODONE-ACETAMINOPHEN 7.5-325 MG/15ML PO SOLN: 15.0000 mL | Freq: Four times a day (QID) | ORAL | Status: DC | PRN

## 2013-10-10 NOTE — Discharge Instructions (Signed)
EATING AFTER YOUR ESOPHAGEAL SURGERY °(Stomach Fundoplication, Hiatal Hernia repair, Achalasia surgery, etc) ° °After your esophageal surgery, expect some sticking with swallowing over the next 1-2 months.   ° °If food sticks when you eat, it is called "dysphagia".  This is due to swelling around your esophagus at the wrap & hiatal diaphragm repair.  It will gradually ease off over the next few months.  To help you through this temporary phase, we start you out on a pureed (blenderized) diet. ° °Your first meal in the hospital was thin liquids.  You should have been given a pureed diet by the time you left the hospital.  We ask patients to stay on a pureed diet for the first 2-3 weeks to avoid anything getting "stuck" near your recent surgery.  Don't be alarmed if your ability to swallow doesn't progress according to this plan.  Everyone is different and some diets can advance more or less quickly.   ° ° °Some BASIC RULES to follow are: °· Maintain an upright position whenever eating or drinking. °· Take small bites - just a teaspoon size bite at a time. °· Eat slowly.  It may also help to eat only one food at a time. °· Consider nibbling through smaller, more frequent meals & avoid the urge to eat BIG meals °· Do not push through feelings of fullness, nausea, or bloatedness °· Do not mix solid foods and liquids in the same mouthful °· Try not to "wash foods down" with large gulps of liquids. °Avoid carbonated (bubbly/fizzy) drinks.  Understand that it will be hard to burp and belch at first.  This gradually improves with time.  Expect to be more gassy/flatulent/bloated initially.  Walking will help you work through that.  Maalox/Gas-X can help as well. °· Eat in a relaxed atmosphere & minimize distractions. °· Avoid talking while eating.   °· Do not use straws. °· Following each meal, sit in an upright position (90 degree angle) for 60 to 90 minutes.  Going for a short walk can help as well °· If food does stick,  don't panic.  Try to relax and let the food pass on its own.  Sipping WARM LIQUID such as strong hot black tea can also help slide it down. ° ° °Be gradual in changes & use common sense: ° °-If you easily tolerating a certain "level" of foods, advance to the next level gradually °-If you are having trouble swallowing a particular food, then avoid it.   °-If food is sticking when you advance your diet, go back to thinner previous diet (the lower LEVEL) for 1-2 days. ° °LEVEL 1 = PUREED DIET ° °Do for the first 2 WEEKS AFTER SURGERY ° °-Foods in this group are pureed or blenderized to a smooth, mashed potato-like consistency.  °-If necessary, the pureed foods can keep their shape with the addition of a thickening agent.   °-Meat should be pureed to a smooth, pasty consistency.  Hot broth or gravy may be added to the pureed meat, approximately 1 oz. of liquid per 3 oz. serving of meat. °-CAUTION:  If any foods do not puree into a smooth consistency, swallowing will be more difficult.  (For example, nuts or seeds sometimes do not blend well.) ° °Hot Foods Cold Foods  °Pureed scrambled eggs and cheese Pureed cottage cheese  °Baby cereals Thickened juices and nectars  °Thinned cooked cereals (no lumps) Thickened milk or eggnog  °Pureed French toast or pancakes Ensure  °Mashed   potatoes Ice cream  °Pureed parsley, au gratin, scalloped potatoes, candied sweet potatoes Fruit or Italian ice, sherbet  °Pureed buttered or alfredo noodles Plain yogurt  °Pureed vegetables (no corn or peas) Instant breakfast  °Pureed soups and creamed soups Smooth pudding, mousse, custard  °Pureed scalloped apples Whipped gelatin  °Gravies Sugar, syrup, honey, jelly  °Sauces, cheese, tomato, barbecue, white, creamed Cream  °Any baby food Creamer  °Alcohol in moderation (not beer or champagne) Margarine  °Coffee or tea Mayonnaise  ° Ketchup, mustard  ° Apple sauce  ° °SAMPLE MENU:  PUREED DIET °Breakfast Lunch Dinner  °· Orange juice, 1/2  cup °· Cream of wheat, 1/2 cup · Pineapple juice, 1/2 cup · Pureed turkey, barley soup, 3/4 cup °· Pureed Hawaiian chicken, 3 oz  °· Scrambled eggs, mashed or blended with cheese, 1/2 cup °· Tea or coffee, 1 cup  °· Whole milk, 1 cup  °· Non-dairy creamer, 2 Tbsp. · Mashed potatoes, 1/2 cup °· Pureed cooled broccoli, 1/2 cup °· Apple sauce, 1/2 cup °· Coffee or tea · Mashed potatoes, 1/2 cup °· Pureed spinach, 1/2 cup °· Frozen yogurt, 1/2 cup °· Tea or coffee  ° ° ° ° °LEVEL 2 = SOFT DIET ° °After your first 2 weeks, you can advance to a soft diet.   °Keep on this diet until everything goes down easily. ° °Hot Foods Cold Foods  °White fish Cottage cheese  °Stuffed fish Junior baby fruit  °Baby food meals Semi thickened juices  °Minced soft cooked, scrambled, poached eggs nectars  °Souffle & omelets Ripe mashed bananas  °Cooked cereals Canned fruit, pineapple sauce, milk  °potatoes Milkshake  °Buttered or Alfredo noodles Custard  °Cooked cooled vegetable Puddings, including tapioca  °Sherbet Yogurt  °Vegetable soup or alphabet soup Fruit ice, Italian ice  °Gravies Whipped gelatin  °Sugar, syrup, honey, jelly Junior baby desserts  °Sauces:  Cheese, creamed, barbecue, tomato, white Cream  °Coffee or tea Margarine  ° °SAMPLE MENU:  LEVEL 2 °Breakfast Lunch Dinner  °· Orange juice, 1/2 cup °· Oatmeal, 1/2 cup °· Scrambled eggs with cheese, 1/2 cup °· Decaffeinated tea, 1 cup °· Whole milk, 1 cup °· Non-dairy creamer, 2 Tbsp · Pineapple juice, 1/2 cup °· Minced beef, 3 oz °· Gravy, 2 Tbsp °· Mashed potatoes, 1/2 cup °· Minced fresh broccoli, 1/2 cup °· Applesauce, 1/2 cup °· Coffee, 1 cup · Turkey, barley soup, 3/4 cup °· Minced Hawaiian chicken, 3 oz °· Mashed potatoes, 1/2 cup °· Cooked spinach, 1/2 cup °· Frozen yogurt, 1/2 cup °· Non-dairy creamer, 2 Tbsp  ° ° ° ° °LEVEL 3 = CHOPPED DIET ° °-After all the foods in level 2 (soft diet) are passing through well you should advance up to more chopped foods.  °-It is still  important to cut these foods into small pieces and eat slowly. ° °Hot Foods Cold Foods  °Poultry Cottage cheese  °Chopped Swedish meatballs Yogurt  °Meat salads (ground or flaked meat) Milk  °Flaked fish (tuna) Milkshakes  °Poached or scrambled eggs Soft, cold, dry cereal  °Souffles and omelets Fruit juices or nectars  °Cooked cereals Chopped canned fruit  °Chopped French toast or pancakes Canned fruit cocktail  °Noodles or pasta (no rice) Pudding, mousse, custard  °Cooked vegetables (no frozen peas, corn, or mixed vegetables) Green salad  °Canned small sweet peas Ice cream  °Creamed soup or vegetable soup Fruit ice, Italian ice  °Pureed vegetable soup or alphabet soup Non-dairy creamer  °Ground scalloped   apples Margarine  °Gravies Mayonnaise  °Sauces:  Cheese, creamed, barbecue, tomato, white Ketchup  °Coffee or tea Mustard  ° °SAMPLE MENU:  LEVEL 3 °Breakfast Lunch Dinner  °· Orange juice, 1/2 cup °· Oatmeal, 1/2 cup °· Scrambled eggs with cheese, 1/2 cup °· Decaffeinated tea, 1 cup °· Whole milk, 1 cup °· Non-dairy creamer, 2 Tbsp °· Ketchup, 1 Tbsp °· Margarine, 1 tsp °· Salt, 1/4 tsp °· Sugar, 2 tsp · Pineapple juice, 1/2 cup °· Ground beef, 3 oz °· Gravy, 2 Tbsp °· Mashed potatoes, 1/2 cup °· Cooked spinach, 1/2 cup °· Applesauce, 1/2 cup °· Decaffeinated coffee °· Whole milk °· Non-dairy creamer, 2 Tbsp °· Margarine, 1 tsp °· Salt, 1/4 tsp · Pureed turkey, barley soup, 3/4 cup °· Barbecue chicken, 3 oz °· Mashed potatoes, 1/2 cup °· Ground fresh broccoli, 1/2 cup °· Frozen yogurt, 1/2 cup °· Decaffeinated tea, 1 cup °· Non-dairy creamer, 2 Tbsp °· Margarine, 1 tsp °· Salt, 1/4 tsp °· Sugar, 1 tsp  ° ° °LEVEL 4:  REGULAR FOODS ° °-Foods in this group are soft, moist, regularly textured foods.   °-This level includes meat and breads, which tend to be the hardest things to swallow.   °-Eat very slowly, chew well and continue to avoid carbonated drinks. °-most people are at this level in 4-6 weeks ° °Hot Foods  Cold Foods  °Baked fish or skinned Soft cheeses - cottage cheese  °Souffles and omelets Cream cheese  °Eggs Yogurt  °Stuffed shells Milk  °Spaghetti with meat sauce Milkshakes  °Cooked cereal Cold dry cereals (no nuts, dried fruit, coconut)  °French toast or pancakes Crackers  °Buttered toast Fruit juices or nectars  °Noodles or pasta (no rice) Canned fruit  °Potatoes (all types) Ripe bananas  °Soft, cooked vegetables (no corn, lima, or baked beans) Peeled, ripe, fresh fruit  °Creamed soups or vegetable soup Cakes (no nuts, dried fruit, coconut)  °Canned chicken noodle soup Plain doughnuts  °Gravies Ice cream  °Bacon dressing Pudding, mousse, custard  °Sauces:  Cheese, creamed, barbecue, tomato, white Fruit ice, Italian ice, sherbet  °Decaffeinated tea or coffee Whipped gelatin  °Pork chops Regular gelatin  ° Canned fruited gelatin molds  ° Sugar, syrup, honey, jam, jelly  ° Cream  ° Non-dairy  ° Margarine  ° Oil  ° Mayonnaise  ° Ketchup  ° Mustard  ° ° °If you have any questions please call our office at [facility]: [phone]. ° °

## 2013-10-10 NOTE — Discharge Summary (Signed)
Physician Discharge Summary  Patient ID: Matthew Evans MRN: [id number] DOB/AGE: 1951-01-06 63 y.o.  Admit date: 10/09/2013 Discharge date: 10/10/2013  Admission Diagnoses: s/p nissen fundoplication  Discharge Diagnoses:  Active Problems:   Status post Nissen fundoplication   Discharged Condition: good  Hospital Course: Pt was admitted post op to the floor.  He was ambulatin in the hall well the day of there surgery.  He had a swallow eval per RADS which showed no leak.  He was started on CLears and tol well.  He had good pain control.  He was deemed stable for DC and DC'd home.  Consults: None  Significant Diagnostic Studies: radiology: esophagogram which reveals no leak.  Treatments: surgery: as above  Discharge Exam: Blood pressure 142/71, pulse 63, temperature 98.1 F (36.7 C), temperature source Oral, resp. rate 18, height 5\' 8"  (1.727 m), weight 226 lb 3.2 oz (102.604 kg), SpO2 99.00%. General appearance: alert and cooperative Resp: clear to auscultation bilaterally Cardio: regular rate and rhythm, S1, S2 normal, no murmur, click, rub or gallop GI: soft, non-tender; bowel sounds normal; no masses,  no organomegaly  Disposition: 01-Home or Self Care   Future Appointments Provider Department Dept Phone   10/29/2013 4:40 PM Axel FillerArmando Starlett Pehrson, MD [facility] [place] [phone]       Medication List         amLODipine 5 MG tablet  Commonly known as:  NORVASC  Take 5 mg by mouth every morning.     aspirin EC 81 MG tablet  Take 81 mg by mouth daily.     clopidogrel 75 MG tablet  Commonly known as:  PLAVIX  Take 75 mg by mouth daily with breakfast.     Fish Oil 1200 MG Caps  Take 2 capsules by mouth 2 (two) times daily.     HYDROcodone-acetaminophen 7.5-325 mg/15 ml solution  Commonly known as:  HYCET  Take 15 mLs by mouth 4 (four) times daily as needed for moderate pain.     hydroxychloroquine 200 MG tablet  Commonly known as:  PLAQUENIL  Take 1 tablet  by mouth 2 (two) times daily.     isosorbide mononitrate 60 MG 24 hr tablet  Commonly known as:  IMDUR  Take 90 mg by mouth at bedtime. Takes 1 and 1/2     levothyroxine 175 MCG tablet  Commonly known as:  SYNTHROID, LEVOTHROID  Take 175 mcg by mouth daily before breakfast.     metoprolol succinate 25 MG 24 hr tablet  Commonly known as:  TOPROL-XL  Take 25 mg by mouth at bedtime. Takes 1/2 tablet     omeprazole-sodium bicarbonate 40-1100 MG per capsule  Commonly known as:  ZEGERID  Take 1 capsule by mouth daily as needed (heartburn).     pantoprazole 40 MG tablet  Commonly known as:  PROTONIX  Take 1 tablet (40 mg total) by mouth daily.     Umeclidinium-Vilanterol 62.5-25 MCG/INH Aepb  Commonly known as:  ANORO ELLIPTA  Inhale 1 puff into the lungs daily.           Follow-up Information   Follow up with Lajean Saveramirez Jr., Juanna Pudlo, MD. Schedule an appointment as soon as possible for a visit in 2 weeks. (For wound re-check)    Specialty:  General Surgery   Contact information:   [address] [place] [address] [phone]       Signed: Axel Fillerrmando Sitara Cashwell 10/10/2013, 11:42 AM

## 2013-10-10 NOTE — Telephone Encounter (Signed)
Called and left message for patient with new post op appointment date & time for 10/22/13 @ 9:45am w/Dr. Derrell Lollingamirez.

## 2013-10-10 NOTE — Progress Notes (Signed)
Nutrition Education  Patient identified via diet education consult.  Wt Readings from Last 3 Encounters:  10/09/13 226 lb 3.2 oz (102.604 kg)  10/09/13 226 lb 3.2 oz (102.604 kg)  10/02/13 216 lb (97.977 kg)    Body mass index is 34.4 kg/(m^2). Patient meets criteria for class I obesity based on current BMI.   Pt is s/p nissen fundoplication, performed 5/7. Met with pt to discuss post-op diet. Emphasized importance of consuming liquids only for the first 2 weeks then a soft diet. Handouts provided with detailed diet information and RD contact information. Discussed that pt should not consume any carbonated beverages. Teach back method used, pt and spouse expressed understanding, expect good compliance.   Mikey College MS, [place], [place] Pager [phone] After Hours Pager

## 2013-10-10 NOTE — Telephone Encounter (Signed)
Message copied by Maryan PulsMOORE, Carsten Carstarphen on Fri Oct 10, 2013  4:38 PM ------      Message from: Axel FillerAMIREZ, ARMANDO      Created: Fri Oct 10, 2013 11:38 AM       He needs an appointment to see me as close to two weeks as possible.  I think right now its more like 3 weeks out.  [facility]                        AR ------

## 2013-10-10 NOTE — Progress Notes (Signed)
1 Day Post-Op  Subjective: Pt doing well today.  Stating he's ready to go home. Ambulated in hall several times throughout the night.  Objective: Vital signs in last 24 hours: Temp:  [97.6 F (36.4 C)-99.4 F (37.4 C)] 98.1 F (36.7 C) (05/08 0530) Pulse Rate:  [51-100] 63 (05/08 0530) Resp:  [14-18] 18 (05/08 0530) BP: (142-168)/(66-84) 142/71 mmHg (05/08 0530) SpO2:  [92 %-100 %] 99 % (05/08 0530) Weight:  [226 lb 3.2 oz (102.604 kg)] 226 lb 3.2 oz (102.604 kg) (05/07 1233)    Intake/Output from previous day: 05/07 0701 - 05/08 0700 In: 4173.3 [I.V.:4173.3] Out: 1970 [Urine:1920; Blood:50] Intake/Output this shift:    General appearance: alert and cooperative Resp: clear to auscultation bilaterally Cardio: regular rate and rhythm, S1, S2 normal, no murmur, click, rub or gallop GI: s/nt/nd active BS  Lab Results:  No results found for this basename: WBC, HGB, HCT, PLT,  in the last 72 hours BMET No results found for this basename: NA, K, CL, CO2, GLUCOSE, BUN, CREATININE, CALCIUM,  in the last 72 hours PT/INR No results found for this basename: LABPROT, INR,  in the last 72 hours ABG No results found for this basename: PHART, PCO2, PO2, HCO3,  in the last 72 hours  Studies/Results: No results found.  Anti-infectives: Anti-infectives   Start     Dose/Rate Route Frequency Ordered Stop   10/09/13 0630  vancomycin (VANCOCIN) 1,500 mg in sodium chloride 0.9 % 500 mL IVPB     1,500 mg 250 mL/hr over 120 Minutes Intravenous On call to O.R. 10/09/13 0556 10/09/13 0714      Assessment/Plan: s/p Procedure(s): ROBOTIC LAPAROSCOPIC NISSEN FUNDOPLICATION (N/A) Will await Esophagogram to eval nissen and r/o esoph leak If Xray ok will start liq Dietician to see today to educate about liquid PO for the next two weeks and avoiding carbonated bevarges Possible home later today  LOS: 1 day    Axel Fillerrmando Karigan Cloninger 10/10/2013

## 2013-10-22 ENCOUNTER — Telehealth (INDEPENDENT_AMBULATORY_CARE_PROVIDER_SITE_OTHER): Payer: Self-pay

## 2013-10-22 ENCOUNTER — Encounter (INDEPENDENT_AMBULATORY_CARE_PROVIDER_SITE_OTHER): Payer: Self-pay | Admitting: General Surgery

## 2013-10-22 ENCOUNTER — Ambulatory Visit (INDEPENDENT_AMBULATORY_CARE_PROVIDER_SITE_OTHER): Payer: BC Managed Care – PPO | Admitting: General Surgery

## 2013-10-22 ENCOUNTER — Encounter (INDEPENDENT_AMBULATORY_CARE_PROVIDER_SITE_OTHER): Payer: BC Managed Care – PPO | Admitting: General Surgery

## 2013-10-22 ENCOUNTER — Other Ambulatory Visit (INDEPENDENT_AMBULATORY_CARE_PROVIDER_SITE_OTHER): Payer: Self-pay

## 2013-10-22 ENCOUNTER — Ambulatory Visit
Admission: RE | Admit: 2013-10-22 | Discharge: 2013-10-22 | Disposition: A | Discharge status: Home / Self Care | Payer: BC Managed Care – PPO | Source: Ambulatory Visit | Attending: General Surgery | Admitting: General Surgery

## 2013-10-22 VITALS — BP 128/74 | HR 63 | Temp 97.5°F | Ht 68.0 in | Wt 204.0 lb

## 2013-10-22 DIAGNOSIS — R05 Cough: Secondary | ICD-10-CM

## 2013-10-22 DIAGNOSIS — Z09 Encounter for follow-up examination after completed treatment for conditions other than malignant neoplasm: Secondary | ICD-10-CM

## 2013-10-22 DIAGNOSIS — Z9889 Other specified postprocedural states: Secondary | ICD-10-CM

## 2013-10-22 DIAGNOSIS — R059 Cough, unspecified: Secondary | ICD-10-CM

## 2013-10-22 NOTE — Progress Notes (Signed)
Patient ID: Matthew Evans, male   DOB: 1950-06-14, 63 y.o.   MRN: [id number] Post op course The patient is a 63 year old male status post robotic Nissen fundoplication. Patient was then did well postoperatively. He has been sitting with his liquid diet at this time. Has been going down well. He's had no signs or symptoms of reflux which was his main complaint preoperatively.  On Exam:  wounds are clean dry and intact   Assessment and Plan 63 year old male status post robotic Nissen fundoplication 1. We discussed staying on a phase II diet as per his handout that was given to him. 2. I encouraged the patient to still stay away from any heavy lifting at this time. 3. Patient to follow up in 2 weeks to   Matthew FillerArmando Azion Centrella, MD [facility] [place] [facility] Trauma & Emergency Surgery

## 2013-10-22 NOTE — Patient Instructions (Signed)
EATING AFTER YOUR ESOPHAGEAL SURGERY °(Stomach Fundoplication, Hiatal Hernia repair, Achalasia surgery, etc) ° °After your esophageal surgery, expect some sticking with swallowing over the next 1-2 months.   ° °If food sticks when you eat, it is called "dysphagia".  This is due to swelling around your esophagus at the wrap & hiatal diaphragm repair.  It will gradually ease off over the next few months.  To help you through this temporary phase, we start you out on a pureed (blenderized) diet. ° °Your first meal in the hospital was thin liquids.  You should have been given a pureed diet by the time you left the hospital.  We ask patients to stay on a pureed diet for the first 2-3 weeks to avoid anything getting "stuck" near your recent surgery.  Don't be alarmed if your ability to swallow doesn't progress according to this plan.  Everyone is different and some diets can advance more or less quickly.   ° ° °Some BASIC RULES to follow are: °· Maintain an upright position whenever eating or drinking. °· Take small bites - just a teaspoon size bite at a time. °· Eat slowly.  It may also help to eat only one food at a time. °· Consider nibbling through smaller, more frequent meals & avoid the urge to eat BIG meals °· Do not push through feelings of fullness, nausea, or bloatedness °· Do not mix solid foods and liquids in the same mouthful °· Try not to "wash foods down" with large gulps of liquids. °Avoid carbonated (bubbly/fizzy) drinks.  Understand that it will be hard to burp and belch at first.  This gradually improves with time.  Expect to be more gassy/flatulent/bloated initially.  Walking will help you work through that.  Maalox/Gas-X can help as well. °· Eat in a relaxed atmosphere & minimize distractions. °· Avoid talking while eating.   °· Do not use straws. °· Following each meal, sit in an upright position (90 degree angle) for 60 to 90 minutes.  Going for a short walk can help as well °· If food does stick,  don't panic.  Try to relax and let the food pass on its own.  Sipping WARM LIQUID such as strong hot black tea can also help slide it down. ° ° °Be gradual in changes & use common sense: ° °-If you easily tolerating a certain "level" of foods, advance to the next level gradually °-If you are having trouble swallowing a particular food, then avoid it.   °-If food is sticking when you advance your diet, go back to thinner previous diet (the lower LEVEL) for 1-2 days. ° °LEVEL 1 = PUREED DIET ° °Do for the first 2 WEEKS AFTER SURGERY ° °-Foods in this group are pureed or blenderized to a smooth, mashed potato-like consistency.  °-If necessary, the pureed foods can keep their shape with the addition of a thickening agent.   °-Meat should be pureed to a smooth, pasty consistency.  Hot broth or gravy may be added to the pureed meat, approximately 1 oz. of liquid per 3 oz. serving of meat. °-CAUTION:  If any foods do not puree into a smooth consistency, swallowing will be more difficult.  (For example, nuts or seeds sometimes do not blend well.) ° °Hot Foods Cold Foods  °Pureed scrambled eggs and cheese Pureed cottage cheese  °Baby cereals Thickened juices and nectars  °Thinned cooked cereals (no lumps) Thickened milk or eggnog  °Pureed French toast or pancakes Ensure  °Mashed   potatoes Ice cream  °Pureed parsley, au gratin, scalloped potatoes, candied sweet potatoes Fruit or Italian ice, sherbet  °Pureed buttered or alfredo noodles Plain yogurt  °Pureed vegetables (no corn or peas) Instant breakfast  °Pureed soups and creamed soups Smooth pudding, mousse, custard  °Pureed scalloped apples Whipped gelatin  °Gravies Sugar, syrup, honey, jelly  °Sauces, cheese, tomato, barbecue, white, creamed Cream  °Any baby food Creamer  °Alcohol in moderation (not beer or champagne) Margarine  °Coffee or tea Mayonnaise  ° Ketchup, mustard  ° Apple sauce  ° °SAMPLE MENU:  PUREED DIET °Breakfast Lunch Dinner  °· Orange juice, 1/2  cup °· Cream of wheat, 1/2 cup · Pineapple juice, 1/2 cup · Pureed turkey, barley soup, 3/4 cup °· Pureed Hawaiian chicken, 3 oz  °· Scrambled eggs, mashed or blended with cheese, 1/2 cup °· Tea or coffee, 1 cup  °· Whole milk, 1 cup  °· Non-dairy creamer, 2 Tbsp. · Mashed potatoes, 1/2 cup °· Pureed cooled broccoli, 1/2 cup °· Apple sauce, 1/2 cup °· Coffee or tea · Mashed potatoes, 1/2 cup °· Pureed spinach, 1/2 cup °· Frozen yogurt, 1/2 cup °· Tea or coffee  ° ° ° ° °LEVEL 2 = SOFT DIET ° °After your first 2 weeks, you can advance to a soft diet.   °Keep on this diet until everything goes down easily. ° °Hot Foods Cold Foods  °White fish Cottage cheese  °Stuffed fish Junior baby fruit  °Baby food meals Semi thickened juices  °Minced soft cooked, scrambled, poached eggs nectars  °Souffle & omelets Ripe mashed bananas  °Cooked cereals Canned fruit, pineapple sauce, milk  °potatoes Milkshake  °Buttered or Alfredo noodles Custard  °Cooked cooled vegetable Puddings, including tapioca  °Sherbet Yogurt  °Vegetable soup or alphabet soup Fruit ice, Italian ice  °Gravies Whipped gelatin  °Sugar, syrup, honey, jelly Junior baby desserts  °Sauces:  Cheese, creamed, barbecue, tomato, white Cream  °Coffee or tea Margarine  ° °SAMPLE MENU:  LEVEL 2 °Breakfast Lunch Dinner  °· Orange juice, 1/2 cup °· Oatmeal, 1/2 cup °· Scrambled eggs with cheese, 1/2 cup °· Decaffeinated tea, 1 cup °· Whole milk, 1 cup °· Non-dairy creamer, 2 Tbsp · Pineapple juice, 1/2 cup °· Minced beef, 3 oz °· Gravy, 2 Tbsp °· Mashed potatoes, 1/2 cup °· Minced fresh broccoli, 1/2 cup °· Applesauce, 1/2 cup °· Coffee, 1 cup · Turkey, barley soup, 3/4 cup °· Minced Hawaiian chicken, 3 oz °· Mashed potatoes, 1/2 cup °· Cooked spinach, 1/2 cup °· Frozen yogurt, 1/2 cup °· Non-dairy creamer, 2 Tbsp  ° ° ° ° °LEVEL 3 = CHOPPED DIET ° °-After all the foods in level 2 (soft diet) are passing through well you should advance up to more chopped foods.  °-It is still  important to cut these foods into small pieces and eat slowly. ° °Hot Foods Cold Foods  °Poultry Cottage cheese  °Chopped Swedish meatballs Yogurt  °Meat salads (ground or flaked meat) Milk  °Flaked fish (tuna) Milkshakes  °Poached or scrambled eggs Soft, cold, dry cereal  °Souffles and omelets Fruit juices or nectars  °Cooked cereals Chopped canned fruit  °Chopped French toast or pancakes Canned fruit cocktail  °Noodles or pasta (no rice) Pudding, mousse, custard  °Cooked vegetables (no frozen peas, corn, or mixed vegetables) Green salad  °Canned small sweet peas Ice cream  °Creamed soup or vegetable soup Fruit ice, Italian ice  °Pureed vegetable soup or alphabet soup Non-dairy creamer  °Ground scalloped   apples Margarine  °Gravies Mayonnaise  °Sauces:  Cheese, creamed, barbecue, tomato, white Ketchup  °Coffee or tea Mustard  ° °SAMPLE MENU:  LEVEL 3 °Breakfast Lunch Dinner  °· Orange juice, 1/2 cup °· Oatmeal, 1/2 cup °· Scrambled eggs with cheese, 1/2 cup °· Decaffeinated tea, 1 cup °· Whole milk, 1 cup °· Non-dairy creamer, 2 Tbsp °· Ketchup, 1 Tbsp °· Margarine, 1 tsp °· Salt, 1/4 tsp °· Sugar, 2 tsp · Pineapple juice, 1/2 cup °· Ground beef, 3 oz °· Gravy, 2 Tbsp °· Mashed potatoes, 1/2 cup °· Cooked spinach, 1/2 cup °· Applesauce, 1/2 cup °· Decaffeinated coffee °· Whole milk °· Non-dairy creamer, 2 Tbsp °· Margarine, 1 tsp °· Salt, 1/4 tsp · Pureed turkey, barley soup, 3/4 cup °· Barbecue chicken, 3 oz °· Mashed potatoes, 1/2 cup °· Ground fresh broccoli, 1/2 cup °· Frozen yogurt, 1/2 cup °· Decaffeinated tea, 1 cup °· Non-dairy creamer, 2 Tbsp °· Margarine, 1 tsp °· Salt, 1/4 tsp °· Sugar, 1 tsp  ° ° °LEVEL 4:  REGULAR FOODS ° °-Foods in this group are soft, moist, regularly textured foods.   °-This level includes meat and breads, which tend to be the hardest things to swallow.   °-Eat very slowly, chew well and continue to avoid carbonated drinks. °-most people are at this level in 4-6 weeks ° °Hot Foods  Cold Foods  °Baked fish or skinned Soft cheeses - cottage cheese  °Souffles and omelets Cream cheese  °Eggs Yogurt  °Stuffed shells Milk  °Spaghetti with meat sauce Milkshakes  °Cooked cereal Cold dry cereals (no nuts, dried fruit, coconut)  °French toast or pancakes Crackers  °Buttered toast Fruit juices or nectars  °Noodles or pasta (no rice) Canned fruit  °Potatoes (all types) Ripe bananas  °Soft, cooked vegetables (no corn, lima, or baked beans) Peeled, ripe, fresh fruit  °Creamed soups or vegetable soup Cakes (no nuts, dried fruit, coconut)  °Canned chicken noodle soup Plain doughnuts  °Gravies Ice cream  °Bacon dressing Pudding, mousse, custard  °Sauces:  Cheese, creamed, barbecue, tomato, white Fruit ice, Italian ice, sherbet  °Decaffeinated tea or coffee Whipped gelatin  °Pork chops Regular gelatin  ° Canned fruited gelatin molds  ° Sugar, syrup, honey, jam, jelly  ° Cream  ° Non-dairy  ° Margarine  ° Oil  ° Mayonnaise  ° Ketchup  ° Mustard  ° ° °If you have any questions please call our office at [facility]: [phone]. ° °

## 2013-10-22 NOTE — Telephone Encounter (Signed)
Called patient to make aware of chest xray results.  RE:  Patient need's to see PCP CXR shows signs of Bronchitis.  Xray negative for Pneumonia.  Patient has follow up appointment scheduled for 11/05/13 @ 3:00pm w/Dr. Derrell Lollingamirez.

## 2013-10-23 ENCOUNTER — Encounter (INDEPENDENT_AMBULATORY_CARE_PROVIDER_SITE_OTHER): Payer: BC Managed Care – PPO | Admitting: General Surgery

## 2013-10-23 NOTE — Telephone Encounter (Signed)
Pt's wife given chest x-ray results.  She will call their PCP for follow up.

## 2013-10-29 ENCOUNTER — Encounter (INDEPENDENT_AMBULATORY_CARE_PROVIDER_SITE_OTHER): Payer: BC Managed Care – PPO | Admitting: General Surgery

## 2013-11-03 ENCOUNTER — Other Ambulatory Visit (INDEPENDENT_AMBULATORY_CARE_PROVIDER_SITE_OTHER): Payer: BC Managed Care – PPO

## 2013-11-03 ENCOUNTER — Telehealth: Payer: Self-pay | Admitting: Pulmonary Disease

## 2013-11-03 ENCOUNTER — Ambulatory Visit (INDEPENDENT_AMBULATORY_CARE_PROVIDER_SITE_OTHER): Payer: BC Managed Care – PPO | Admitting: Adult Health

## 2013-11-03 ENCOUNTER — Encounter: Payer: Self-pay | Admitting: Adult Health

## 2013-11-03 VITALS — BP 126/72 | HR 89 | Temp 97.8°F | Ht 69.75 in | Wt 199.0 lb

## 2013-11-03 DIAGNOSIS — R0609 Other forms of dyspnea: Secondary | ICD-10-CM

## 2013-11-03 DIAGNOSIS — J4489 Other specified chronic obstructive pulmonary disease: Secondary | ICD-10-CM

## 2013-11-03 DIAGNOSIS — J449 Chronic obstructive pulmonary disease, unspecified: Secondary | ICD-10-CM

## 2013-11-03 DIAGNOSIS — R0989 Other specified symptoms and signs involving the circulatory and respiratory systems: Secondary | ICD-10-CM

## 2013-11-03 MED ORDER — PREDNISONE 10 MG PO TABS: ORAL_TABLET | ORAL | Status: DC

## 2013-11-03 MED ORDER — AMOXICILLIN-POT CLAVULANATE 875-125 MG PO TABS: 1.0000 | ORAL_TABLET | Freq: Two times a day (BID) | ORAL | Status: AC

## 2013-11-03 MED ORDER — LEVALBUTEROL HCL 0.63 MG/3ML IN NEBU
0.6300 mg | INHALATION_SOLUTION | Freq: Once | RESPIRATORY_TRACT | Status: AC
  Administered 2013-11-03: 0.63 mg via RESPIRATORY_TRACT

## 2013-11-03 NOTE — Progress Notes (Signed)
   Subjective:    Patient ID: Matthew Evans, male    DOB: 1951/02/28, 63 y.o.   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  11/03/13 Acute OV  Complains of increased SOB, tightness, prod cough with yellow mucus, wheezing, low grade temp/chills x1 month - Denies hemoptysis, nausea, vomiting.   Reports symptoms began after [place] repair 1 month ago.  Finished zpak last week from PCP. Does not feel like it was strong enough. Still coughing up thick yellow mucus  CXR was neg for PNA on 10/2013 w/ PCP    Review of Systems Constitutional:   No  weight loss, night sweats,   +Fevers, chills, fatigue, or  lassitude.  HEENT:   No headaches,  Difficulty swallowing,  Tooth/dental problems, or  Sore throat,                No sneezing, itching, ear ache,  +nasal congestion, post nasal drip,   CV:  No chest pain,  Orthopnea, PND, swelling in lower extremities, anasarca, dizziness, palpitations, syncope.   GI  No heartburn, indigestion, abdominal pain, nausea, vomiting, diarrhea, change in bowel habits, loss of appetite, bloody stools.   Resp:    No chest wall deformity  Skin: no rash or lesions.  GU: no dysuria, change in color of urine, no urgency or frequency.  No flank pain, no hematuria   MS:  No joint pain or swelling.  No decreased range of motion.  No back pain.  Psych:  No change in mood or affect. No depression or anxiety.  No memory loss.         Objective:   Physical Exam GEN: A/Ox3; pleasant , NAD, well nourished   HEENT:  [place]/AT,  EACs-clear, TMs-wnl, NOSE-clear, THROAT-clear, no lesions, no postnasal drip or exudate noted.   NECK:  Supple w/ fair ROM; no JVD; normal carotid impulses w/o bruits; no thyromegaly or nodules palpated; no lymphadenopathy.  RESP  Few rhonchi no accessory muscle use, no dullness to percussion  CARD:  RRR, no m/r/g  , no peripheral edema, pulses intact, no cyanosis or  clubbing.  GI:   Soft & nt; nml bowel sounds; no organomegaly or masses detected.  Musco: Warm bil, no deformities or joint swelling noted.   Neuro: alert, no focal deficits noted.    Skin: Warm, no lesions or rashes         Assessment & Plan:

## 2013-11-03 NOTE — Telephone Encounter (Signed)
COPD pt pf KC's, last seen 7.21.14: Patient Instructions     Stop Dulera 100/5  Try Anoro 1 inhalation each morning.  Can still use Albuterol as needed.  Call me in 3 weeks with response.  Keep in mind other things can cause shortness of breath as well.   Called spoke with patient, unable to come for appt at 3pm d/t wife's work schedule >> acute ov w/ TP scheduled for 4pm today 6.1.15. Nothing further needed at this time; pt aware to call the office or seek emergency assistance if needed prior to appt. Will sign off.

## 2013-11-03 NOTE — Telephone Encounter (Signed)
Pt c/o increasd SOB and wheezing x 1 month--worsening. Pt reports having surgical procedure 09/2013; had hiatal hernia repair and esophagus wrapped Pt reports that these symptoms have been present since this procedure.  Pt states that he has began to have a hard time sleeping d/t increased SOB when lying flat.  SOB is persistant and lasting all day now.   Patient requests an appt with someone today. Aware that KC not in office.  Would like to see TP this afternoon.   Shanda Bumps please advise on the two held appt times 3p and 4p. Thanks.

## 2013-11-03 NOTE — Patient Instructions (Addendum)
Prednisone taper over next week.  Augmentin 875mg  Twice daily   for 7 days -take with food  Labs today  Follow up Dr. Shelle Iron in 2 weeks and As needed   Please contact office for sooner follow up if symptoms do not improve or worsen or seek emergency care

## 2013-11-04 LAB — BASIC METABOLIC PANEL
BUN: 22 mg/dL (ref 6–23)
CO2: 29 meq/L (ref 19–32)
Calcium: 9.5 mg/dL (ref 8.4–10.5)
Chloride: 106 mEq/L (ref 96–112)
Creatinine, Ser: 1.4 mg/dL (ref 0.4–1.5)
GFR: 56.69 mL/min — ABNORMAL LOW (ref >60.00)
GLUCOSE: 101 mg/dL — AB (ref 70–99)
POTASSIUM: 4.9 meq/L (ref 3.5–5.1)
SODIUM: 142 meq/L (ref 135–145)

## 2013-11-04 LAB — BRAIN NATRIURETIC PEPTIDE: Pro B Natriuretic peptide (BNP): 20 pg/mL (ref 0.0–100.0)

## 2013-11-04 LAB — D-DIMER, QUANTITATIVE: D-Dimer, Quant: 0.44 ug/mL-FEU (ref 0.00–0.48)

## 2013-11-05 ENCOUNTER — Ambulatory Visit (INDEPENDENT_AMBULATORY_CARE_PROVIDER_SITE_OTHER): Payer: BC Managed Care – PPO | Admitting: General Surgery

## 2013-11-05 ENCOUNTER — Encounter (INDEPENDENT_AMBULATORY_CARE_PROVIDER_SITE_OTHER): Payer: Self-pay | Admitting: General Surgery

## 2013-11-05 VITALS — BP 138/70 | HR 70 | Temp 97.8°F | Wt 194.0 lb

## 2013-11-05 DIAGNOSIS — Z9889 Other specified postprocedural states: Secondary | ICD-10-CM

## 2013-11-05 DIAGNOSIS — Z09 Encounter for follow-up examination after completed treatment for conditions other than malignant neoplasm: Secondary | ICD-10-CM

## 2013-11-05 NOTE — Progress Notes (Signed)
Patient ID: Matthew Evans, male   DOB: 18-May-1951, 63 y.o.   MRN: [id number] Post op course Patient is a 63 year old male status post robotic Nissen fundoplication for GERD. Patient has been doing well his last clinic visit has been on his phase II diet without issues. The patient lost approximately 10 pounds since last visit. He is no longer had a reflux, and is off his reflux medication.  On Exam: His wounds are clean dry and intact  Assessment and Plan 63 year old male status post provider Nissen fundoplication for GERD 1. The patient is okay to proceed to  Phase 3 diet.  We discussed no meats or breads. 2. Patient follow up in 2 weeks   Axel Filler, MD [facility] General & Minimally Invasive Surgery Trauma & Emergency Surgery

## 2013-11-05 NOTE — Assessment & Plan Note (Addendum)
Flare  W/ AB  Check D dimer and bnp given recent surgery.   Plan  Prednisone taper over next week.  Augmentin 875mg  Twice daily   for 7 days -take with food  Labs today  Follow up Dr. Shelle Iron in 2 weeks and As needed   Please contact office for sooner follow up if symptoms do not improve or worsen or seek emergency care

## 2013-11-10 ENCOUNTER — Other Ambulatory Visit: Payer: Self-pay | Admitting: *Deleted

## 2013-11-10 MED ORDER — METOPROLOL SUCCINATE ER 25 MG PO TB24: ORAL_TABLET | ORAL | Status: DC

## 2013-11-19 ENCOUNTER — Encounter (INDEPENDENT_AMBULATORY_CARE_PROVIDER_SITE_OTHER): Payer: BC Managed Care – PPO | Admitting: General Surgery

## 2013-11-21 ENCOUNTER — Other Ambulatory Visit (HOSPITAL_COMMUNITY): Payer: Self-pay | Admitting: Cardiovascular Disease

## 2013-11-21 ENCOUNTER — Ambulatory Visit (HOSPITAL_COMMUNITY)
Admission: RE | Admit: 2013-11-21 | Discharge: 2013-11-21 | Disposition: A | Discharge status: Home / Self Care | Payer: BC Managed Care – PPO | Source: Ambulatory Visit | Attending: Cardiovascular Disease | Admitting: Cardiovascular Disease

## 2013-11-21 DIAGNOSIS — I714 Abdominal aortic aneurysm, without rupture, unspecified: Secondary | ICD-10-CM

## 2013-11-21 NOTE — Progress Notes (Signed)
Abdominal Aortic Duplex Completed °Brianna L Mazza,RVT °

## 2013-11-24 ENCOUNTER — Encounter (INDEPENDENT_AMBULATORY_CARE_PROVIDER_SITE_OTHER): Payer: Self-pay | Admitting: General Surgery

## 2013-11-24 ENCOUNTER — Ambulatory Visit (INDEPENDENT_AMBULATORY_CARE_PROVIDER_SITE_OTHER): Payer: BC Managed Care – PPO | Admitting: General Surgery

## 2013-11-24 VITALS — BP 122/74 | HR 66 | Temp 97.2°F | Resp 18 | Ht 68.0 in | Wt 196.2 lb

## 2013-11-24 DIAGNOSIS — Z09 Encounter for follow-up examination after completed treatment for conditions other than malignant neoplasm: Secondary | ICD-10-CM

## 2013-11-24 DIAGNOSIS — Z9889 Other specified postprocedural states: Secondary | ICD-10-CM

## 2013-11-24 NOTE — Progress Notes (Signed)
Patient ID: Matthew Evans, male   DOB: [birth year], 63 y.o.   MRN: [id number] Post op course The patient is a 63 year old male status post laparoscopic Nissen fundoplication for GERD. Patient currently 6 weeks out.  The patient has been doing well since his last visit. He's had normal swallowing function. He said no reflux. He has not taken his PPIs.  On Exam: Wounds clean dry and intact   Assessment and Plan 63 year old male status post Nissen fundoplication for GERD 1. At this point we will unrestrict the patient's food  2.the patient in followup as needed   Axel FillerArmando Ramirez, MD [facility] General & Minimally Invasive Surgery Trauma & Emergency Surgery

## 2013-11-26 ENCOUNTER — Encounter (INDEPENDENT_AMBULATORY_CARE_PROVIDER_SITE_OTHER): Payer: Self-pay

## 2013-11-26 ENCOUNTER — Encounter: Payer: Self-pay | Admitting: Pulmonary Disease

## 2013-11-26 ENCOUNTER — Ambulatory Visit (INDEPENDENT_AMBULATORY_CARE_PROVIDER_SITE_OTHER): Payer: BC Managed Care – PPO | Admitting: Pulmonary Disease

## 2013-11-26 VITALS — BP 110/78 | HR 58 | Temp 97.8°F | Ht 68.0 in | Wt 200.8 lb

## 2013-11-26 DIAGNOSIS — J438 Other emphysema: Secondary | ICD-10-CM

## 2013-11-26 MED ORDER — ALBUTEROL SULFATE 108 (90 BASE) MCG/ACT IN AEPB: 2.0000 | INHALATION_SPRAY | Freq: Four times a day (QID) | RESPIRATORY_TRACT | Status: DC | PRN

## 2013-11-26 NOTE — Assessment & Plan Note (Signed)
The patient is back to his usual baseline from a COPD standpoint. He feels the anoro has helped him tremendously, but is still having dyspnea on exertion out of proportion to his degree of airflow obstruction. Certainly some of this can be explained by his weight and conditioning, but he also has a history of coronary disease. I've asked him to continue on his current meds, but also to get back with his cardiologist for a followup.

## 2013-11-26 NOTE — Progress Notes (Signed)
   Subjective:    Patient ID: Matthew Evans, male    DOB: 1951-01-15, 63 y.o.   MRN: [id number]  HPI Patient comes in today for followup of his known mild COPD. He's had a recent acute exacerbation related to a pulmonary infection, and responded to treatment with an antibiotic and prednisone by our nurse practitioner. He feels that he has gotten back to baseline, but continues to still have significant dyspnea on exertion. He really did not have a response to dulera, but has seen significant improvement with anoro. Currently he denies any significant cough or purulent mucus.   Review of Systems  Constitutional: Negative for fever and unexpected weight change.  HENT: Negative for congestion, dental problem, ear pain, nosebleeds, postnasal drip, rhinorrhea, sinus pressure, sneezing, sore throat and trouble swallowing.   Eyes: Negative for redness and itching.  Respiratory: Positive for cough and shortness of breath. Negative for chest tightness and wheezing.   Cardiovascular: Negative for palpitations and leg swelling.  Gastrointestinal: Negative for nausea and vomiting.  Genitourinary: Negative for dysuria.  Musculoskeletal: Negative for joint swelling.  Skin: Negative for rash.  Neurological: Negative for headaches.  Hematological: Does not bruise/bleed easily.  Psychiatric/Behavioral: Negative for dysphoric mood. The patient is not nervous/anxious.        Objective:   Physical Exam Overweight male in no acute distress Nose without purulence or discharge noted Neck without lymphadenopathy or thyromegaly Chest fairly clear, with just a few rhonchi. No wheezing noted Cardiac exam with regular rate and rhythm Lower extremities with no significant edema, no cyanosis Alert and oriented, moves all 4 extremities       Assessment & Plan:

## 2013-11-26 NOTE — Patient Instructions (Signed)
Continue on anoro as you are doing. You need to have an albuterol inhaler to use for rescue.  Take 2 inhalations every 6 hrs only if needed for rescue.  Always sit first to see if resolves on his own.   Stay as active as possible, and work on weight loss Since your degree of shortness of breath is out of proportion to your degree of copd, would recommend that you followup with your cardiologist. followup with me in 6mos.

## 2013-12-10 ENCOUNTER — Encounter: Payer: Self-pay | Admitting: *Deleted

## 2013-12-15 ENCOUNTER — Other Ambulatory Visit: Payer: Self-pay | Admitting: *Deleted

## 2013-12-15 MED ORDER — CLOPIDOGREL BISULFATE 75 MG PO TABS: 75.0000 mg | ORAL_TABLET | Freq: Every day | ORAL | Status: DC

## 2013-12-15 MED ORDER — AMLODIPINE BESYLATE 5 MG PO TABS: 5.0000 mg | ORAL_TABLET | Freq: Every morning | ORAL | Status: DC

## 2013-12-15 NOTE — Telephone Encounter (Signed)
Rx was sent to pharmacy electronically. 

## 2014-02-17 ENCOUNTER — Other Ambulatory Visit: Payer: Self-pay | Admitting: Pulmonary Disease

## 2014-02-18 ENCOUNTER — Ambulatory Visit (INDEPENDENT_AMBULATORY_CARE_PROVIDER_SITE_OTHER): Payer: BC Managed Care – PPO | Admitting: Cardiovascular Disease

## 2014-02-18 ENCOUNTER — Encounter: Payer: Self-pay | Admitting: Cardiovascular Disease

## 2014-02-18 VITALS — BP 109/64 | HR 46 | Ht 68.0 in | Wt 201.3 lb

## 2014-02-18 DIAGNOSIS — I493 Ventricular premature depolarization: Secondary | ICD-10-CM

## 2014-02-18 DIAGNOSIS — E782 Mixed hyperlipidemia: Secondary | ICD-10-CM

## 2014-02-18 DIAGNOSIS — I2510 Atherosclerotic heart disease of native coronary artery without angina pectoris: Secondary | ICD-10-CM

## 2014-02-18 DIAGNOSIS — R0609 Other forms of dyspnea: Secondary | ICD-10-CM

## 2014-02-18 DIAGNOSIS — J438 Other emphysema: Secondary | ICD-10-CM

## 2014-02-18 DIAGNOSIS — R0989 Other specified symptoms and signs involving the circulatory and respiratory systems: Secondary | ICD-10-CM

## 2014-02-18 DIAGNOSIS — I4949 Other premature depolarization: Secondary | ICD-10-CM

## 2014-02-18 DIAGNOSIS — E785 Hyperlipidemia, unspecified: Secondary | ICD-10-CM

## 2014-02-18 DIAGNOSIS — E039 Hypothyroidism, unspecified: Secondary | ICD-10-CM

## 2014-02-18 DIAGNOSIS — I714 Abdominal aortic aneurysm, without rupture, unspecified: Secondary | ICD-10-CM

## 2014-02-18 DIAGNOSIS — I10 Essential (primary) hypertension: Secondary | ICD-10-CM

## 2014-02-18 NOTE — Patient Instructions (Addendum)
Your physician wants you to follow-up in: 3 months. You will receive a reminder letter in the mail two months in advance. If you don't receive a letter, please call our office to schedule the follow-up appointment.  Your physician has recommended you make the following change in your medication: decrease the metoprolol succ 25 mg to 1/4 tablet daily for 5 days then  1/4 tablet every other day then STOP.   Your physician recommends that you return for lab work prior to your next visit.

## 2014-02-18 NOTE — Progress Notes (Signed)
Patient ID: Matthew Evans, male   DOB: 31-May-1951, 63 y.o.   MRN: [id number]      HPI: Matthew Evans, is a 63 y.o. male who presents to the office today for a nine-month cardiology followup evaluation.    Matthew Evans has established coronary artery disease.  In April 2009 suffered an ST segment elevation inferior MI and underwent RCA stenting. He underwent staged cutting balloon to a high grade LAD stenosis which involved multiple branches and stenting was not done to reduce potential for stent jailing of his multiple branches. In August 2010 he was found to have 50-60% stenosis beyond his RCA stent in the LAD intervention site remained patent. Has a documented abdominal aortic aneurysm and on 08/29/2012 underwent a one-year followup evaluation with Doppler imaging. This was essentially unchanged from one year previously and showed a measurement of 3.4x3.7 cm.  Additional problems include COPD, hypertension, hypothyroidism, hyperlipidemia, GERD. His last nuclear study in February 2014 remained low risk and did show mild apical hypokinesis with low normal wall motion without ischemia. An echo Doppler study in February 2014 showed mild LVH with grade 1 diastolic dysfunction and normal systolic function. He did have aortic valve thickening without stenosis and mild left atrial dilatation. Had normal pulmonary pressures.  Matthew. Evans does have a history of palpitations which has been treated with beta blocker therapy.  Recently, he has been taking this at a reduced dose of just 12.5 mg daily.  He does note occasional wheezing.  He has COPD and is followed by Dr. Jeremy Johann.  He recently has been on Anora in addition to when necessary albuterol.  Since I last saw him, he underwent a Nissen fundoplication hiatal hernia surgery by Dr. Derrell Lolling in May, 2015.  Since that time, he has lost approximately 20 pounds.  He does have an abdominal aortic aneurysm.  He underwent a one year followup.  The Doppler study.  This revealed a  current measurement of 3.6 x 3.3 cm with a small amount of atherosclerosis visualized, which was not hemodynamically significant.  This was not significantly changed from one year previously.  Presently, he denies recurrent anginal symptoms.  He denies recurrent palpitations.  He denies any bleeding.  He denies PND, or.  He does note rare wheezing.  He presents for a neurologic followup evaluation.  Past Medical History  Diagnosis Date  . Myocardial infarction 09/2007    PCI to RCA  . Carotid artery occlusion   . AAA (abdominal aortic aneurysm)     mild; doppler 08/29/12-3.4x3.7  . Thyroid disease     hypothyroidism  . Emphysema   . Mini stroke   . Lupus     [name], [name]  . Memory loss     d/t mini stroke  . CAD (coronary artery disease)     echo 07/26/12-EF 55-60%; myoview 07/26/12-low normal wall motion with mild apical hypokinesis as well a septal wall motion consistent with LBBB  . HTN (hypertension)   . Hyperlipidemia   . GERD (gastroesophageal reflux disease)   . H/O hiatal hernia   . Headache(784.0)   . Arthritis   . Sleep apnea     sleep study 12/14/06 ahi 16.89, during rem 12.3, supine ahi 57.49; cpap 02/22/07-c-flex of 3 at 13cm h2o; PT STATES HE DOES NOT USE HIS CPAP EVERY NIGHT  . Stroke     PT STATES NEUROLOGIST TOLD HIM HE HAD  HAD MINI STROKE  . Shortness of breath     WITH EXERTION  . Hypothyroidism     HX OF RADIOACTIVE IODINE   . History of palpitations     Past Surgical History  Procedure Laterality Date  . Coronary stent placement  09/2007    right  . Balloon atherotomy      LAD  . Cardiac catheterization  08/11/11    EF50-55%, small AAA, no signifiant stent restenosis in RCA , no restenosis of PTCA of LAD, medical therapy  . Cardiac catheterization  09/27/06    cutting balloon arthrotomy of LAD stenosis with dilatation of a 3.5x76mm cutting balloon  . Coronary angioplasty with stent  placement  09/24/06    PTCA/stent of RCA with a 3.5x48mm Taxus DES post dilated to 3.26mm  . Cardiac catheterization  01/07/09    50-60% progressive dz beyond the stented segment in the RCA , LAD intervention remained patent  . Esophageal manometry N/A 08/04/2013    Procedure: ESOPHAGEAL MANOMETRY (EM);  Surgeon: Theda Belfast, MD;  Location: [facility];  Service: Endoscopy;  Laterality: N/A;    Allergies  Allergen Reactions  . Latex     unknown  . Niaspan [Niacin] Rash  . Septra [Sulfamethoxazole-Trimethoprim] Rash  . Simcor [Niacin-Simvastatin Er] Rash  . Zocor [Simvastatin] Rash    Current Outpatient Prescriptions  Medication Sig Dispense Refill  . Albuterol Sulfate (PROAIR RESPICLICK) 108 (90 BASE) MCG/ACT AEPB Inhale 2 puffs into the lungs every 6 (six) hours as needed.  1 each  6  . amLODipine (NORVASC) 5 MG tablet Take 1 tablet (5 mg total) by mouth every morning.  30 tablet  4  . ANORO ELLIPTA 62.5-25 MCG/INH AEPB INHALE 1 PUFF INTO THE LUNGS DAILY.  60 each  3  . aspirin EC 81 MG tablet Take 81 mg by mouth daily.        . clopidogrel (PLAVIX) 75 MG tablet Take 1 tablet (75 mg total) by mouth daily with breakfast.  30 tablet  4  . hydrochlorothiazide (MICROZIDE) 12.5 MG capsule Take 1 capsule by mouth every other day.       . hydroxychloroquine (PLAQUENIL) 200 MG tablet Take 1 tablet by mouth 2 (two) times daily.       . isosorbide mononitrate (IMDUR) 60 MG 24 hr tablet Take 90 mg by mouth at bedtime. Takes 1 and 1/2      . levothyroxine (SYNTHROID, LEVOTHROID) 175 MCG tablet Take 175 mcg by mouth daily before breakfast.      . losartan (COZAAR) 50 MG tablet Take 1 tablet by mouth daily.      . metoprolol succinate (TOPROL-XL) 25 MG 24 hr tablet Takes 1/2 to 1 tablet daily  30 tablet  6  . Omega-3 Fatty Acids (FISH OIL) 1200 MG CAPS Take 2 capsules by mouth 2 (two) times daily.       No current facility-administered medications for this visit.    Socially he is married has 3  children. There is a remote tobacco history. He has tried to limit his sodium intake.  ROS General: Negative; No fevers, chills, or night sweats;  HEENT: Negative; No changes in vision or hearing, sinus congestion, difficulty swallowing Pulmonary: Positive for COPD, with occasional wheezing;  mild shortness of breath with uphill walking Cardiovascular: Negative; No chest pain, presyncope, syncope, palpitations GI: Hiatal hernia symptoms has resolved  since his Nissen fundoplication; No nausea, vomiting, diarrhea, or abdominal pain GU: Negative; No dysuria, hematuria, or difficulty  voiding Musculoskeletal: Negative; no myalgias, joint pain, or weakness Hematologic/Oncology: Negative; no easy bruising, bleeding Endocrine: Negative; no heat/cold intolerance; no diabetes Neuro: Negative; no changes in balance, headaches Skin: Negative; No rashes or skin lesions Psychiatric: Negative; No behavioral problems, depression Sleep: Negative; No snoring, daytime sleepiness, hypersomnolence, bruxism, restless legs, hypnogognic hallucinations, no cataplexy Other comprehensive 14 point system review is negative.   PE BP 109/64  Pulse 46  Ht  (1.727 m)  Wt 201 lb 4.8 oz (91.309 kg)  BMI 30.61 kg/m2  General: Alert, oriented, no distress HEENT: Normocephalic, atraumatic. Pupils round and reactive; sclera anicteric;  Nose without nasal septal hypertrophy Mouth/Parynx benign; Mallinpatti scale 2/3 Neck: No JVD, no carotid bruits .  Normal carotid upstroke Lungs: decreased breath sounds; no wheezing or rales Heart: RRR, s1 s2 normal 1/6 sem; no S3 or S4 gallop; no diastolic murmur Abdomen: soft, nontender; no hepatosplenomehaly, BS+; abdominal aorta nontender and not dilated by palpation.  Back: No CVA tenderness  Moderate central adiposity Pulses 2+ Extremities: no clubbinbg cyanosis or edema, Homan's sign negative  Neurologic: grossly nonfocal Psychological: Normal affect and mood  ECG  (independently read by me): Sinus bradycardia at 46 beats per minute.  Left bundle branch block with repolarization changes  05/13/2013 ECG: Sinus bradycardia 50 beats per minute. beats per minute with mild left axis deviation and left bundle branch block. There is complete resolution of prior ectopy.   LABS:  BMET    Component Value Date/Time   NA 142 11/03/2013 1737   K 4.9 11/03/2013 1737   CL 106 11/03/2013 1737   CO2 29 11/03/2013 1737   GLUCOSE 101* 11/03/2013 1737   BUN 22 11/03/2013 1737   CREATININE 1.4 11/03/2013 1737   CREATININE 1.24 10/29/2012 1634   CALCIUM 9.5 11/03/2013 1737   GFRNONAA 59* 10/02/2013 1535   GFRAA 68* 10/02/2013 1535     Hepatic Function Panel     Component Value Date/Time   PROT 6.0 10/29/2012 1634   ALBUMIN 3.8 10/29/2012 1634   AST 15 10/29/2012 1634   ALT 11 10/29/2012 1634   ALKPHOS 79 10/29/2012 1634   BILITOT 0.5 10/29/2012 1634     CBC    Component Value Date/Time   WBC 7.2 10/02/2013 1535   RBC 5.19 10/02/2013 1535   HGB 14.8 10/02/2013 1535   HCT 44.5 10/02/2013 1535   PLT 254 10/02/2013 1535   MCV 85.7 10/02/2013 1535   MCH 28.5 10/02/2013 1535   MCHC 33.3 10/02/2013 1535   RDW 13.0 10/02/2013 1535   LYMPHSABS 1.2 03/31/2011 1700   MONOABS 1.0 03/31/2011 1700   EOSABS 0.1 03/31/2011 1700   BASOSABS 0.1 03/31/2011 1700     BNP    Component Value Date/Time   PROBNP 20.0 11/03/2013 1737    Lipid Panel     Component Value Date/Time   CHOL 114 10/29/2012 1634     RADIOLOGY: No results found.    ASSESSMENT AND PLAN: Matthew Evans is now 6 years status post his inferior ST segment elevation myocardial infarction for which he underwent successful stenting of his RCA and stage cutting balloon to high grade LAD stenosis.  Presently, his blood pressure is well controlled on his current regimen, consisting of HCTZ 12.5 mg every other day, losartan, 50 mg, Toprol-XL 12.5 mg and amlodipine 5 mg.  He is bradycardic with a pulse of 46.  He also has  noticed some mild intermittent wheezing.  I'm recommending slow titration with discontinuance of  his Toprol, such that he will reduce this to 6.25 mg for 5 days and then every other day for 5 days and then the Toprol will be discontinued altogether.  He's not having anginal symptoms on his isosorbide mononitrate 60 mg and has concomitant CAD as noted above.  He tolerated his Nissen fundoplication from a cardiovascular standpoint.  He is no longer having GERD symptoms.  I commended him on his 20 pound weight loss.  He's not having any palpitations.  I reviewed his recent abdominal ultrasound, which shows his abdominal aortic aneurysm to be stable and has not enlarged since his last study in March 2014. In 3 months, repeat laboratory will be obtained, and I will  see him for followup evaluation  Lennette Bihari, MD, [facility]  02/18/2014 1:46 PM

## 2014-03-16 ENCOUNTER — Other Ambulatory Visit: Payer: Self-pay

## 2014-03-16 MED ORDER — ISOSORBIDE MONONITRATE ER 60 MG PO TB24: 90.0000 mg | ORAL_TABLET | Freq: Every day | ORAL | Status: DC

## 2014-03-16 NOTE — Telephone Encounter (Signed)
Rx sent to pharmacy   

## 2014-04-21 ENCOUNTER — Other Ambulatory Visit: Payer: Self-pay | Admitting: Cardiovascular Disease

## 2014-04-21 NOTE — Telephone Encounter (Signed)
Rx has been sent to the pharmacy electronically. ° °

## 2014-05-10 ENCOUNTER — Other Ambulatory Visit: Payer: Self-pay | Admitting: Pulmonary Disease

## 2014-05-14 ENCOUNTER — Encounter (HOSPITAL_COMMUNITY): Payer: Self-pay | Admitting: Cardiovascular Disease

## 2014-05-18 ENCOUNTER — Other Ambulatory Visit: Payer: Self-pay

## 2014-05-18 MED ORDER — HYDROCHLOROTHIAZIDE 12.5 MG PO CAPS: 12.5000 mg | ORAL_CAPSULE | ORAL | Status: DC

## 2014-05-18 NOTE — Telephone Encounter (Signed)
Rx sent to pharmacy   

## 2014-05-28 ENCOUNTER — Ambulatory Visit: Payer: BC Managed Care – PPO | Admitting: Pulmonary Disease

## 2014-05-31 ENCOUNTER — Other Ambulatory Visit: Payer: Self-pay | Admitting: Cardiovascular Disease

## 2014-06-01 NOTE — Telephone Encounter (Signed)
Rx refill sent to patient pharmacy   

## 2014-06-11 ENCOUNTER — Encounter: Payer: Self-pay | Admitting: Pulmonary Disease

## 2014-06-11 ENCOUNTER — Ambulatory Visit (INDEPENDENT_AMBULATORY_CARE_PROVIDER_SITE_OTHER): Payer: BLUE CROSS/BLUE SHIELD | Admitting: Pulmonary Disease

## 2014-06-11 VITALS — BP 132/66 | HR 55 | Temp 97.0°F | Ht 71.0 in | Wt 206.0 lb

## 2014-06-11 DIAGNOSIS — J438 Other emphysema: Secondary | ICD-10-CM

## 2014-06-11 NOTE — Assessment & Plan Note (Signed)
The patient feels that his breathing is stable from the last visit, but he is not sure that anoro is doing anything for him. He also feels that his rescue inhaler at times does nothing for him either. I have explained this usually means that his breathing issue is more related to weight and conditioning than it is to his very mild COPD. I would like to change his anoro to Spiriva alone, and see if he maintains stability in his exertional tolerance with the change. I have also stressed to him the importance of weight loss and conditioning.

## 2014-06-11 NOTE — Progress Notes (Signed)
   Subjective:    Patient ID: Matthew Evans, male    DOB: 29-Nov-1950, 64 y.o.   MRN: [id number]  HPI The patient comes in today for follow-up of his mild COPD with significant dyspnea on exertion. He has been placed on anoro to see if this would help his breathing significantly, and at this point he thinks it is doing very little. I suspect this is because the majority of his breathing issue is coming from his weight and conditioning rather than his very mild lung disease. Patient denies any significant cough or mucus production.   Review of Systems  Constitutional: Negative for fever and unexpected weight change.  HENT: Negative for congestion, dental problem, ear pain, nosebleeds, postnasal drip, rhinorrhea, sinus pressure, sneezing, sore throat and trouble swallowing.   Eyes: Negative for redness and itching.  Respiratory: Positive for cough and shortness of breath. Negative for chest tightness and wheezing.   Cardiovascular: Negative for palpitations and leg swelling.  Gastrointestinal: Negative for nausea and vomiting.  Genitourinary: Negative for dysuria.  Musculoskeletal: Negative for joint swelling.  Skin: Negative for rash.  Neurological: Negative for headaches.  Hematological: Does not bruise/bleed easily.  Psychiatric/Behavioral: Negative for dysphoric mood. The patient is not nervous/anxious.        Objective:   Physical Exam Well-developed male in no acute distress Nose without purulence or discharge noted Neck without lymphadenopathy or thyromegaly Chest with clear breath sounds, no crackles or wheezes Cardiac exam with regular rate and rhythm Lower extremities with trace edema, no cyanosis Alert and oriented, moves all 4 extremities.       Assessment & Plan:

## 2014-06-11 NOTE — Patient Instructions (Signed)
Stop anoro, and try spiriva respimat alone.  2 inhalations each am.  If you are doing ok with this, let us know and we can send in prescription.  If you feel you did better on anoro, go back to that medication Work on conditioining and weight loss.  These will help your breathing more than any medication. followup with me again in one year if doing well.

## 2014-09-14 ENCOUNTER — Emergency Department (HOSPITAL_COMMUNITY)
Admission: EM | Admit: 2014-09-14 | Discharge: 2014-09-14 | Disposition: A | Discharge status: Home / Self Care | Payer: BLUE CROSS/BLUE SHIELD | Attending: Emergency Medicine | Admitting: Emergency Medicine

## 2014-09-14 ENCOUNTER — Encounter (HOSPITAL_COMMUNITY): Payer: Self-pay | Admitting: Emergency Medicine

## 2014-09-14 ENCOUNTER — Emergency Department (HOSPITAL_COMMUNITY): Payer: BLUE CROSS/BLUE SHIELD

## 2014-09-14 ENCOUNTER — Telehealth: Payer: Self-pay | Admitting: Cardiovascular Disease

## 2014-09-14 DIAGNOSIS — I252 Old myocardial infarction: Secondary | ICD-10-CM | POA: Insufficient documentation

## 2014-09-14 DIAGNOSIS — G4730 Sleep apnea, unspecified: Secondary | ICD-10-CM | POA: Diagnosis not present

## 2014-09-14 DIAGNOSIS — Z9861 Coronary angioplasty status: Secondary | ICD-10-CM | POA: Diagnosis not present

## 2014-09-14 DIAGNOSIS — M1990 Unspecified osteoarthritis, unspecified site: Secondary | ICD-10-CM | POA: Insufficient documentation

## 2014-09-14 DIAGNOSIS — Z791 Long term (current) use of non-steroidal anti-inflammatories (NSAID): Secondary | ICD-10-CM | POA: Diagnosis not present

## 2014-09-14 DIAGNOSIS — Z87891 Personal history of nicotine dependence: Secondary | ICD-10-CM | POA: Insufficient documentation

## 2014-09-14 DIAGNOSIS — R0789 Other chest pain: Secondary | ICD-10-CM | POA: Diagnosis not present

## 2014-09-14 DIAGNOSIS — Z9889 Other specified postprocedural states: Secondary | ICD-10-CM | POA: Insufficient documentation

## 2014-09-14 DIAGNOSIS — I2510 Atherosclerotic heart disease of native coronary artery without angina pectoris: Secondary | ICD-10-CM | POA: Diagnosis not present

## 2014-09-14 DIAGNOSIS — R0602 Shortness of breath: Secondary | ICD-10-CM | POA: Insufficient documentation

## 2014-09-14 DIAGNOSIS — Z7902 Long term (current) use of antithrombotics/antiplatelets: Secondary | ICD-10-CM | POA: Diagnosis not present

## 2014-09-14 DIAGNOSIS — Z91040 Latex allergy status: Secondary | ICD-10-CM | POA: Insufficient documentation

## 2014-09-14 DIAGNOSIS — I10 Essential (primary) hypertension: Secondary | ICD-10-CM | POA: Diagnosis not present

## 2014-09-14 DIAGNOSIS — Z8673 Personal history of transient ischemic attack (TIA), and cerebral infarction without residual deficits: Secondary | ICD-10-CM | POA: Insufficient documentation

## 2014-09-14 DIAGNOSIS — E785 Hyperlipidemia, unspecified: Secondary | ICD-10-CM | POA: Diagnosis not present

## 2014-09-14 DIAGNOSIS — Z8719 Personal history of other diseases of the digestive system: Secondary | ICD-10-CM | POA: Diagnosis not present

## 2014-09-14 DIAGNOSIS — R079 Chest pain, unspecified: Secondary | ICD-10-CM

## 2014-09-14 DIAGNOSIS — E039 Hypothyroidism, unspecified: Secondary | ICD-10-CM | POA: Diagnosis not present

## 2014-09-14 DIAGNOSIS — Z79899 Other long term (current) drug therapy: Secondary | ICD-10-CM | POA: Insufficient documentation

## 2014-09-14 DIAGNOSIS — Z9981 Dependence on supplemental oxygen: Secondary | ICD-10-CM | POA: Insufficient documentation

## 2014-09-14 DIAGNOSIS — Z7982 Long term (current) use of aspirin: Secondary | ICD-10-CM | POA: Diagnosis not present

## 2014-09-14 LAB — BASIC METABOLIC PANEL
ANION GAP: 11 (ref 5–15)
BUN: 17 mg/dL (ref 6–23)
CO2: 24 mmol/L (ref 19–32)
Calcium: 9.1 mg/dL (ref 8.4–10.5)
Chloride: 105 mmol/L (ref 96–112)
Creatinine, Ser: 1.39 mg/dL — ABNORMAL HIGH (ref 0.50–1.35)
GFR calc non Af Amer: 52 mL/min — ABNORMAL LOW (ref >90)
GFR, EST AFRICAN AMERICAN: 60 mL/min — AB (ref >90)
Glucose, Bld: 90 mg/dL (ref 70–99)
POTASSIUM: 4.6 mmol/L (ref 3.5–5.1)
SODIUM: 140 mmol/L (ref 135–145)

## 2014-09-14 LAB — CBC WITH DIFFERENTIAL/PLATELET
Basophils Absolute: 0.1 10*3/uL (ref 0.0–0.1)
Basophils Relative: 1 % (ref 0–1)
Eosinophils Absolute: 0.2 10*3/uL (ref 0.0–0.7)
Eosinophils Relative: 2 % (ref 0–5)
HCT: 46.6 % (ref 39.0–52.0)
Hemoglobin: 15.5 g/dL (ref 13.0–17.0)
LYMPHS ABS: 1.6 10*3/uL (ref 0.7–4.0)
Lymphocytes Relative: 16 % (ref 12–46)
MCH: 29.4 pg (ref 26.0–34.0)
MCHC: 33.3 g/dL (ref 30.0–36.0)
MCV: 88.3 fL (ref 78.0–100.0)
MONO ABS: 1 10*3/uL (ref 0.1–1.0)
MONOS PCT: 10 % (ref 3–12)
NEUTROS PCT: 71 % (ref 43–77)
Neutro Abs: 7.1 10*3/uL (ref 1.7–7.7)
PLATELETS: 235 10*3/uL (ref 150–400)
RBC: 5.28 MIL/uL (ref 4.22–5.81)
RDW: 13.6 % (ref 11.5–15.5)
WBC: 9.9 10*3/uL (ref 4.0–10.5)

## 2014-09-14 LAB — I-STAT TROPONIN, ED
TROPONIN I, POC: 0.02 ng/mL (ref 0.00–0.08)
TROPONIN I, POC: 0.07 ng/mL (ref 0.00–0.08)

## 2014-09-14 LAB — BRAIN NATRIURETIC PEPTIDE: B Natriuretic Peptide: 100.8 pg/mL — ABNORMAL HIGH (ref 0.0–100.0)

## 2014-09-14 LAB — D-DIMER, QUANTITATIVE (NOT AT ARMC): D DIMER QUANT: 0.45 ug{FEU}/mL (ref 0.00–0.48)

## 2014-09-14 NOTE — Telephone Encounter (Signed)
Pts wife called, she reports pt was out in garage and doing pressure washing - became extremely red in face and had onset of chest pain. This was about 20 mins ago. She took BP prior to phone call, noted 131/73 w/ rate of 103.  Concerned over HR. Told her HR maybe not concerning on its own since he just was exerting himself - however, active CP concerning. Advised ER eval. She states she considered this, however, pt reluctant to go.   I urged pt to go seek evaluation.  He voiced understanding. Wife states [facility] is [facility].  I informed Rosann Auerbachrish of the patient call.

## 2014-09-14 NOTE — ED Provider Notes (Signed)
CSN: [id number]     Arrival date & time 09/14/14  1504 History   First MD Initiated Contact with Patient 09/14/14 1740     Chief Complaint  Patient presents with  . Chest Pain     (Consider location/radiation/quality/duration/timing/severity/associated sxs/prior Treatment) HPI Matthew Evans is a 64 year old male with past medical history of CAD, MI with PCI to RCA, AAA, hypertension, hyperlipidemia, TIA who presents to the ER after an episode of chest pain and shortness of breath. Patient states he was pressure washing the floor in his garage, was overexerting himself when he had a onset of shortness of breath followed by centralized chest pressure. Patient states his pain became bad enough that he had to stop what he was doing and sit down. Patient states rest slightly alleviated his discomfort, however it took until he got to the emergency room until he was completely pain-free. Patient reports associated diaphoresis with this episode. Patient states he has never had an episode similar to this in the past. Patient denies associated headache, blurred vision, dizziness, weakness, lightheadedness, syncope, recent illness, cough, nausea, vomiting, abdominal pain, diarrhea.  Past Medical History  Diagnosis Date  . Myocardial infarction 09/2007    PCI to RCA  . Carotid artery occlusion   . AAA (abdominal aortic aneurysm)     mild; doppler 08/29/12-3.4x3.7  . Thyroid disease     hypothyroidism  . Emphysema   . Mini stroke   . Lupus     [name], [name]  . Memory loss     d/t mini stroke  . CAD (coronary artery disease)     echo 07/26/12-EF 55-60%; myoview 07/26/12-low normal wall motion with mild apical hypokinesis as well a septal wall motion consistent with LBBB  . HTN (hypertension)   . Hyperlipidemia   . GERD (gastroesophageal reflux disease)   . H/O hiatal hernia   . Headache(784.0)   . Arthritis   . Sleep apnea      sleep study 12/14/06 ahi 16.89, during rem 12.3, supine ahi 57.49; cpap 02/22/07-c-flex of 3 at 13cm h2o; PT STATES HE DOES NOT USE HIS CPAP EVERY NIGHT  . Stroke     PT STATES NEUROLOGIST TOLD HIM HE HAD HAD MINI STROKE  . Shortness of breath     WITH EXERTION  . Hypothyroidism     HX OF RADIOACTIVE IODINE   . History of palpitations    Past Surgical History  Procedure Laterality Date  . Coronary stent placement  09/2007    right  . Balloon atherotomy      LAD  . Cardiac catheterization  08/11/11    EF50-55%, small AAA, no signifiant stent restenosis in RCA , no restenosis of PTCA of LAD, medical therapy  . Cardiac catheterization  09/27/06    cutting balloon arthrotomy of LAD stenosis with dilatation of a 3.5x15mm cutting balloon  . Coronary angioplasty with stent placement  09/24/06    PTCA/stent of RCA with a 3.5x62mm Taxus DES post dilated to 3.93mm  . Cardiac catheterization  01/07/09    50-60% progressive dz beyond the stented segment in the RCA , LAD intervention remained patent  . Esophageal manometry N/A 08/04/2013    Procedure: ESOPHAGEAL MANOMETRY (EM);  Surgeon: Theda Belfast, MD;  Location: [facility];  Service: Endoscopy;  Laterality: N/A;  . Left heart catheterization with coronary angiogram N/A 08/11/2011    Procedure: LEFT HEART CATHETERIZATION WITH CORONARY  ANGIOGRAM;  Surgeon: Lennette Biharihomas A Kelly, MD;  [facility];  Service: Cardiovascular;  Laterality: N/A;   Family History  Problem Relation Age of Onset  . Emphysema Mother   . Heart disease Father   . Mesothelioma Father   . Heart attack Father    History  Substance Use Topics  . Smoking status: Former Smoker -- 2.00 packs/day for 45 years    Types: Cigarettes    Quit date: 02/04/2010  . Smokeless tobacco: Never Used  . Alcohol Use: No     Comment: QUIT 1994    Review of Systems  Constitutional: Negative for fever.  HENT: Negative for trouble swallowing.   Eyes: Negative for visual disturbance.   Respiratory: Positive for shortness of breath.   Cardiovascular: Positive for chest pain.  Gastrointestinal: Negative for nausea, vomiting and abdominal pain.  Genitourinary: Negative for dysuria.  Musculoskeletal: Negative for neck pain.  Skin: Negative for rash.  Neurological: Negative for dizziness, weakness and numbness.  Psychiatric/Behavioral: Negative.       Allergies  Latex; Niaspan; Septra; Simcor; and Zocor  Home Medications   Prior to Admission medications   Medication Sig Start Date End Date Taking? Authorizing Provider  Albuterol Sulfate (PROAIR RESPICLICK) 108 (90 BASE) MCG/ACT AEPB Inhale 2 puffs into the lungs every 6 (six) hours as needed. Patient taking differently: Inhale 2 puffs into the lungs every 6 (six) hours as needed (shortness of breath).  11/26/13  Yes Barbaraann ShareKeith M Clance, MD  amLODipine (NORVASC) 5 MG tablet TAKE 1 TABLET (5 MG TOTAL) BY MOUTH EVERY MORNING. 06/01/14  Yes Lennette Biharihomas A Kelly, MD  ANORO ELLIPTA 62.5-25 MCG/INH AEPB INHALE 1 PUFF INTO THE LUNGS DAILY. 02/17/14  Yes Barbaraann ShareKeith M Clance, MD  aspirin EC 81 MG tablet Take 81 mg by mouth daily.     Yes Historical Provider, MD  clopidogrel (PLAVIX) 75 MG tablet TAKE 1 TABLET (75 MG TOTAL) BY MOUTH DAILY WITH BREAKFAST. 06/01/14  Yes Lennette Biharihomas A Kelly, MD  cyclobenzaprine (FLEXERIL) 10 MG tablet Take 10 mg by mouth 2 (two) times daily as needed for muscle spasms.  08/31/14  Yes Historical Provider, MD  hydrochlorothiazide (MICROZIDE) 12.5 MG capsule Take 1 capsule (12.5 mg total) by mouth every other day. 05/18/14  Yes Lennette Biharihomas A Kelly, MD  HYDROcodone-acetaminophen (NORCO/VICODIN) 5-325 MG per tablet Take 1 tablet by mouth 3 (three) times daily as needed for moderate pain or severe pain.  08/31/14  Yes Historical Provider, MD  isosorbide mononitrate (IMDUR) 60 MG 24 hr tablet Take 1.5 tablets (90 mg total) by mouth at bedtime. Takes 1 and 1/2 Patient taking differently: Take 60 mg by mouth at bedtime. Takes 1 and 1/2  03/16/14  Yes Mihai Croitoru, MD  levothyroxine (SYNTHROID, LEVOTHROID) 175 MCG tablet Take 175 mcg by mouth daily before breakfast.   Yes Historical Provider, MD  losartan (COZAAR) 50 MG tablet TAKE 1 TABLET (50 MG TOTAL) BY MOUTH DAILY. Patient taking differently: TAKE 1 TABLET (50 MG TOTAL) BY MOUTH DAILY AT BEDTIME 04/21/14  Yes Lennette Biharihomas A Kelly, MD  meloxicam (MOBIC) 15 MG tablet Take 15 mg by mouth daily. 08/28/14  Yes Historical Provider, MD  metoprolol succinate (TOPROL-XL) 25 MG 24 hr tablet Takes 1/2 to 1 tablet daily Patient taking differently: Take 12.5 mg by mouth daily.  11/10/13  Yes Lennette Biharihomas A Kelly, MD  Omega-3 Fatty Acids (FISH OIL) 1200 MG CAPS Take 2 capsules by mouth 2 (two) times daily.   Yes Historical Provider, MD  BP 130/67 mmHg  Pulse 71  Temp(Src) 97.6 F (36.4 C)  Resp 21  Wt 208 lb 4 oz (94.462 kg)  SpO2 97% Physical Exam  Constitutional: He is oriented to person, place, and time. He appears well-developed and well-nourished. No distress.  HENT:  Head: Normocephalic and atraumatic.  Mouth/Throat: Oropharynx is clear and moist. No oropharyngeal exudate.  Eyes: EOM are normal. Pupils are equal, round, and reactive to light. Right eye exhibits no discharge. Left eye exhibits no discharge. No scleral icterus.  Neck: Normal range of motion.  Cardiovascular: Normal rate, regular rhythm, S1 normal, S2 normal, normal heart sounds and normal pulses.   No murmur heard. Pulmonary/Chest: Effort normal and breath sounds normal. No respiratory distress.  Abdominal: Soft. There is no tenderness.  Musculoskeletal: Normal range of motion. He exhibits no edema or tenderness.  Neurological: He is alert and oriented to person, place, and time. He has normal strength. No cranial nerve deficit or sensory deficit. Coordination normal. GCS eye subscore is 4. GCS verbal subscore is 5. GCS motor subscore is 6.  Skin: Skin is warm and dry. No rash noted. He is not diaphoretic.  Psychiatric:  He has a normal mood and affect.  Nursing note and vitals reviewed.   ED Course  Procedures (including critical care time) Labs Review Labs Reviewed  BASIC METABOLIC PANEL - Abnormal; Notable for the following:    Creatinine, Ser 1.39 (*)    GFR calc non Af Amer 52 (*)    GFR calc Af Amer 60 (*)    All other components within normal limits  BRAIN NATRIURETIC PEPTIDE - Abnormal; Notable for the following:    B Natriuretic Peptide 100.8 (*)    All other components within normal limits  CBC WITH DIFFERENTIAL/PLATELET  D-DIMER, QUANTITATIVE  I-STAT TROPOININ, ED  Rosezena Sensor, ED    Imaging Review Dg Chest 2 View  09/14/2014   CLINICAL DATA:  Chest pain.  Shortness of breath.  Near syncope.  EXAM: CHEST  2 VIEW  COMPARISON:  10/22/2013  FINDINGS: Mild enlargement of the cardiopericardial silhouette. Remote right rib deformities. No edema or pleural effusion. The lungs appear otherwise clear.  IMPRESSION: 1. Mild enlargement of the cardiopericardial silhouette. No overt edema. 2. Old right rib deformities.   Electronically Signed   By: Gaylyn Rong M.D.   On: 09/14/2014 16:27     EKG Interpretation   Date/Time:  Monday September 14 2014 15:12:16 EDT Ventricular Rate:  103 PR Interval:  158 QRS Duration: 132 QT Interval:  376 QTC Calculation: 492 R Axis:   130 Text Interpretation:  Sinus tachycardia with occasional Premature  ventricular complexes Right axis deviation Non-specific intra-ventricular  conduction block T wave abnormality, consider inferior ischemia Abnormal  ECG Confirmed by Bebe Shaggy  MD, Dorinda Hill ([id number]) on 09/14/2014 3:18:06 PM      MDM   Final diagnoses:  Chest pain, unspecified chest pain type    Patient is to be discharged with recommendation to follow up with cardiologist in regards to today's hospital visit. EKG unchanged from last visit. Patient having frequent PVCs, however patient states this is normal for him. 2 troponins negative while here,  negative troponin greater than 6 hours after episode of chest pain, d-dimer negative. VSS, no tracheal deviation, no JVD or new murmur, RRR, breath sounds equal bilaterally, EKG without acute abnormalities, negative troponin, and negative CXR. Pt has been advised return to the ED is CP becomes exertional, associated with diaphoresis or nausea, radiates to left  jaw/arm, worsens or becomes concerning in any way. Pt appears reliable for follow up and is agreeable to discharge.   Case has been discussed with and seen by Dr. Effie Shy who agrees with the above plan to discharge.   BP 130/67 mmHg  Pulse 71  Temp(Src) 97.6 F (36.4 C)  Resp 21  Wt 208 lb 4 oz (94.462 kg)  SpO2 97%  Signed,  Ladona Mow, PA-C 9:21 PM     Ladona Mow, PA-C 09/14/14 2122  Mancel Bale, MD 09/15/14 [phone]

## 2014-09-14 NOTE — Discharge Instructions (Signed)

## 2014-09-14 NOTE — ED Notes (Signed)
Pt sts SOB and near syncope while pressure washing floor; pt then c/o CP and diaphoresis

## 2014-09-14 NOTE — ED Provider Notes (Signed)
  Face-to-face evaluation   History: He presents for evaluation of near syncope, while working in his garage. He also feels mildly dyspneic at this time. He is also had mild dyspnea on exertion, for several months.  Physical exam: Alert, calm, cooperative. Heart rate is irregular with frequent PVCs. Monitor reveals heart rate as low as 31 with frequent PVCs. He does not perfuse his PVCs, checked at left radial artery, by me.    Medical screening examination/treatment/procedure(s) were conducted as a shared visit with non-physician practitioner(s) and myself.  I personally evaluated the patient during the encounter  Mancel BaleElliott Shaguana Love, MD 09/15/14 [phone]

## 2014-09-15 ENCOUNTER — Telehealth: Payer: Self-pay | Admitting: Cardiovascular Disease

## 2014-09-15 NOTE — Telephone Encounter (Signed)
acknowledged

## 2014-09-15 NOTE — Telephone Encounter (Signed)
Close encounter 

## 2014-09-18 ENCOUNTER — Ambulatory Visit (INDEPENDENT_AMBULATORY_CARE_PROVIDER_SITE_OTHER): Payer: BLUE CROSS/BLUE SHIELD | Admitting: Cardiovascular Disease

## 2014-09-18 ENCOUNTER — Encounter: Payer: Self-pay | Admitting: Cardiovascular Disease

## 2014-09-18 VITALS — BP 106/68 | HR 62 | Ht 68.0 in | Wt 207.0 lb

## 2014-09-18 DIAGNOSIS — I25810 Atherosclerosis of coronary artery bypass graft(s) without angina pectoris: Secondary | ICD-10-CM | POA: Diagnosis not present

## 2014-09-18 DIAGNOSIS — I119 Hypertensive heart disease without heart failure: Secondary | ICD-10-CM

## 2014-09-18 DIAGNOSIS — E039 Hypothyroidism, unspecified: Secondary | ICD-10-CM

## 2014-09-18 DIAGNOSIS — I714 Abdominal aortic aneurysm, without rupture, unspecified: Secondary | ICD-10-CM

## 2014-09-18 DIAGNOSIS — R0609 Other forms of dyspnea: Secondary | ICD-10-CM | POA: Diagnosis not present

## 2014-09-18 DIAGNOSIS — E785 Hyperlipidemia, unspecified: Secondary | ICD-10-CM

## 2014-09-18 DIAGNOSIS — I493 Ventricular premature depolarization: Secondary | ICD-10-CM

## 2014-09-18 MED ORDER — METOPROLOL SUCCINATE ER 25 MG PO TB24: ORAL_TABLET | ORAL | Status: DC

## 2014-09-18 NOTE — Patient Instructions (Signed)
Your physician has requested that you have a lexiscan myoview.   Your physician has requested that you have an echocardiogram. Echocardiography is a painless test that uses sound waves to create images of your heart. It provides your doctor with information about the size and shape of your heart and how well your heart's chambers and valves are working. This procedure takes approximately one hour. There are no restrictions for this procedure.  Your physician has requested that you have an abdominal aorta duplex. During this test, an ultrasound is used to evaluate the aorta. Allow 30 minutes for this exam. Do not eat after midnight the day before and avoid carbonated beverages. This will be done in June 2016.  Your physician has recommended you make the following change in your medication: increase the metoprolol to 25 mg daily for 1 week. ( 1 tablet) then take 1 & 1/2 tablets daily.  Your physician recommends that you schedule a follow-up appointment in: 2 months.

## 2014-09-19 ENCOUNTER — Encounter: Payer: Self-pay | Admitting: Cardiovascular Disease

## 2014-09-19 NOTE — Progress Notes (Signed)
Patient ID: Matthew Evans, male   DOB: 09-27-1950, 64 y.o.   MRN: [id number]      HPI: Matthew Evans, is a 64 y.o. male who presents to the office today for a 85-monthcardiology followup evaluation.    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 He underwent staged cutting balloon to a high grade LAD stenosis which involved multiple branches and stenting was not done to reduce potential for jailing of his multiple branches. In August 2010 he was found to have 50-60% stenosis beyond his RCA stent in the LAD intervention site remained patent. He has a documented abdominal aortic aneurysm and a followup evaluation with Doppler imaging  on 08/29/2012 was essentially unchanged from one year previously and showed a measurement of 3.4x3.7 cm.  Additional problems include COPD, hypertension, hypothyroidism, hyperlipidemia, GERD. His last nuclear study in February 2014 remained low risk and demonstrated mild apical hypokinesis with low normal wall motion without ischemia. An echo Doppler study in February 2014 showed mild LVH with grade 1 diastolic dysfunction and normal systolic function. He did have aortic valve thickening without stenosis and mild left atrial dilatation. Had normal pulmonary pressures.  Matthew. Evans have a history of palpitations which has been treated with beta blocker therapy.  Recently, he has been taking this at a reduced dose of just 12.5 mg daily.  He does note occasional wheezing.  He has COPD and is followed by Dr. GMirian Capuchin  He recently has been on Anora in addition to when necessary albuterol.  Last year he underwent a Nissen fundoplication hiatal hernia surgery by Dr. RRosendo Grosin May, 2015.  Since that time, he has lost approximately 20 pounds.  2015.  A follow-up abdominal aortic ultrasound his revealed  3.6 x 3.3 cm with a small amount of atherosclerosis visualized, which was not hemodynamically significant.   This was not significantly changed from one year previously.  As I last saw him, he 1 recent emergency room evaluation on 09/15/2014.  He had been working aggressively at home in his carotids fairly intensely and overdid it.  He presented with presyncope and also had mild dyspnea.  He had noticed some mild dyspnea on exertion for the past several months.  In the emergency room he was noted to have an irregular heart rate but sinus rhythm with frequent PVCs.  PVCs were not conducted so his resting pulse radially was bradycardic.  He presents now for follow-up cardilogy evaluation.  Past Medical History  Diagnosis Date  . Myocardial infarction 09/2007    PCI to RCA  . Carotid artery occlusion   . AAA (abdominal aortic aneurysm)     mild; doppler 08/29/12-3.4x3.7  . Thyroid disease     hypothyroidism  . Emphysema   . Mini stroke   . Lupus     [name], [name]  . Memory loss     d/t mini stroke  . CAD (coronary artery disease)     echo 07/26/12-EF 55-60%; myoview 07/26/12-low normal wall motion with mild apical hypokinesis as well a septal wall motion consistent with LBBB  . HTN (hypertension)   . Hyperlipidemia   . GERD (gastroesophageal reflux disease)   . H/O hiatal hernia   . Headache(784.0)   . Arthritis   . Sleep apnea     sleep study 12/14/06 ahi 16.89, during rem  12.3, supine ahi 57.49; cpap 02/22/07-c-flex of 3 at 13cm h2o; PT STATES HE DOES NOT USE HIS CPAP EVERY NIGHT  . Stroke     PT STATES NEUROLOGIST TOLD HIM HE HAD HAD MINI STROKE  . Shortness of breath     WITH EXERTION  . Hypothyroidism     HX OF RADIOACTIVE IODINE   . History of palpitations     Past Surgical History  Procedure Laterality Date  . Coronary stent placement  09/2007    right  . Balloon atherotomy      LAD  . Cardiac catheterization  08/11/11    EF50-55%, small AAA, no signifiant stent restenosis in RCA , no restenosis of PTCA of LAD,  medical therapy  . Cardiac catheterization  09/27/06    cutting balloon arthrotomy of LAD stenosis with dilatation of a 3.5x78m cutting balloon  . Coronary angioplasty with stent placement  09/24/06    PTCA/stent of RCA with a 3.5x276mTaxus DES post dilated to 3.9648m. Cardiac catheterization  01/07/09    50-60% progressive dz beyond the stented segment in the RCA , LAD intervention remained patent  . Esophageal manometry N/A 08/04/2013    Procedure: ESOPHAGEAL MANOMETRY (EM);  Surgeon: PatBeryle BeamsD;  Location: [facility];  Service: Endoscopy;  Laterality: N/A;  . Left heart catheterization with coronary angiogram N/A 08/11/2011    Procedure: LEFT HEART CATHETERIZATION WITH CORONARY ANGIOGRAM;  Surgeon: ThoTroy SineD;  [facility];  Service: Cardiovascular;  Laterality: N/A;    Allergies  Allergen Reactions  . Latex     unknown  . Niaspan [Niacin] Rash  . Septra [Sulfamethoxazole-Trimethoprim] Rash  . Simcor [Niacin-Simvastatin Er] Rash  . Zocor [Simvastatin] Rash    Current Outpatient Prescriptions  Medication Sig Dispense Refill  . Albuterol Sulfate (PROAIR RESPICLICK) [id number] BASE) MCG/ACT AEPB Inhale 2 puffs into the lungs every 6 (six) hours as needed. (Patient taking differently: Inhale 2 puffs into the lungs every 6 (six) hours as needed (shortness of breath). ) 1 each 6  . amLODipine (NORVASC) 5 MG tablet TAKE 1 TABLET (5 MG TOTAL) BY MOUTH EVERY MORNING. 30 tablet 10  . ANORO ELLIPTA 62.5-25 MCG/INH AEPB INHALE 1 PUFF INTO THE LUNGS DAILY. 60 each 3  . aspirin EC 81 MG tablet Take 81 mg by mouth daily.      . clopidogrel (PLAVIX) 75 MG tablet TAKE 1 TABLET (75 MG TOTAL) BY MOUTH DAILY WITH BREAKFAST. 30 tablet 10  . cyclobenzaprine (FLEXERIL) 10 MG tablet Take 10 mg by mouth 2 (two) times daily as needed for muscle spasms.   0  . hydrochlorothiazide (MICROZIDE) 12.5 MG capsule Take 1 capsule (12.5 mg total) by mouth every other day. 30 capsule 3  .  HYDROcodone-acetaminophen (NORCO/VICODIN) 5-325 MG per tablet Take 1 tablet by mouth 3 (three) times daily as needed for moderate pain or severe pain.   0  . isosorbide mononitrate (IMDUR) 60 MG 24 hr tablet Take 1.5 tablets (90 mg total) by mouth at bedtime. Takes 1 and 1/2 (Patient taking differently: Take 60 mg by mouth at bedtime. Takes 1 and 1/2) 45 tablet 9  . levothyroxine (SYNTHROID, LEVOTHROID) 175 MCG tablet Take 175 mcg by mouth daily before breakfast.    . losartan (COZAAR) 50 MG tablet TAKE 1 TABLET (50 MG TOTAL) BY MOUTH DAILY. (Patient taking differently: TAKE 1 TABLET (50 MG TOTAL) BY MOUTH DAILY AT BEDTIME) 90 tablet 2  . meloxicam (MOBIC) 15  MG tablet Take 15 mg by mouth daily.  5  . Omega-3 Fatty Acids (FISH OIL) 1200 MG CAPS Take 2 capsules by mouth 2 (two) times daily.    . metoprolol succinate (TOPROL XL) 25 MG 24 hr tablet Take 1 tablet daily for 1 week then increase to 1.5 tablets daily. 45 tablet 6   No current facility-administered medications for this visit.    Socially he is married has 3 children. There is a remote tobacco history. He has tried to limit his sodium intake.  His wife also is my patient was diagnosed with recent breast cancer.  ROS General: Negative; No fevers, chills, or night sweats;  HEENT: Negative; No changes in vision or hearing, sinus congestion, difficulty swallowing Pulmonary: Positive for COPD, with occasional wheezing;  mild shortness of breath with uphill walking Cardiovascular: See history of present illness GI: Hiatal hernia symptoms has resolved  since his Nissen fundoplication; No nausea, vomiting, diarrhea, or abdominal pain GU: Negative; No dysuria, hematuria, or difficulty voiding Musculoskeletal: Negative; no myalgias, joint pain, or weakness Hematologic/Oncology: Negative; no easy bruising, bleeding Endocrine: Negative; no heat/cold intolerance; no diabetes Neuro: Negative; no changes in balance, headaches Skin: Negative; No  rashes or skin lesions Psychiatric: Negative; No behavioral problems, depression Sleep: Negative; No snoring, daytime sleepiness, hypersomnolence, bruxism, restless legs, hypnogognic hallucinations, no cataplexy Other comprehensive 14 point system review is negative.   PE BP 106/68 mmHg  Pulse 62  Ht 5' 8" (1.727 m)  Wt 207 lb (93.895 kg)  BMI 31.48 kg/m2  General: Alert, oriented, no distress HEENT: Normocephalic, atraumatic. Pupils round and reactive; sclera anicteric;  Nose without nasal septal hypertrophy Mouth/Parynx benign; Mallinpatti scale 2/3 Neck: No JVD, no carotid bruits .  Normal carotid upstroke Lungs: decreased breath sounds; no wheezing or rales Heart: RRR, s1 s2 normal 1/6 sem; no S3 or S4 gallop; no diastolic murmur; no rubs thrills or heaves Abdomen: soft, nontender; no hepatosplenomehaly, BS+; abdominal aorta nontender and not dilated by palpation.  Back: No CVA tenderness  Moderate central adiposity Pulses 2+ Extremities: no clubbinbg cyanosis or edema, Homan's sign negative  Neurologic: grossly nonfocal Psychological: Normal affect and mood   ECG (independently read by me): Sinus rhythm at 62 bpm.  PVC.  QTc interval 475 ms.  September 2015 ECG (independently read by me): Sinus bradycardia at 46 beats per minute.  Left bundle branch block with repolarization changes  05/13/2013 ECG: Sinus bradycardia 50 beats per minute. beats per minute with mild left axis deviation and left bundle branch block. There is complete resolution of prior ectopy.   LABS   BMP Latest Ref Rng 09/14/2014 11/03/2013 10/02/2013  Glucose 70 - 99 mg/dL 90 101(H) 87  BUN 6 - 23 mg/dL _0 Creatinine 0.50 - 1.35 mg/dL 1.39(H) 1.4 1.26  Sodium 135 - 145 mmol/L 140 142 140  Potassium 3.5 - 5.1 mmol/L 4.6 4.9 4.2  Chloride 96 - 112 mmol/L 105 106 99  CO2 19 - 32 mmol/L _1 Calcium 8.4 - 10.5 mg/dL 9.1 9.5 9.4   Hepatic Function Panel   Hepatic Function Latest Ref Rng  10/29/2012 10/29/2012 03/31/2011  Total Protein 6.0 - 8.3 g/dL 6.0 6.3 7.4  Albumin 3.5 - 5.2 g/dL 3.8 3.8 3.6  AST 0 - 37 U/L _2 ALT 0 - 53 U/L _3 Alk Phosphatase 39 - 117 U/L 79 81 97  Total Bilirubin 0.3 - 1.2 mg/dL 0.5 0.5 0.8  CBC Latest Ref Rng 09/14/2014 10/02/2013 10/29/2012  WBC 4.0 - 10.5 K/uL 9.9 7.2 5.2  Hemoglobin 13.0 - 17.0 g/dL 15.5 14.8 13.8  Hematocrit 39.0 - 52.0 % 46.6 44.5 40.8  Platelets 150 - 400 K/uL 235 254 255   Lab Results  Component Value Date   TSH 5.349* 10/29/2012    BNP    Component Value Date/Time   PROBNP 20.0 11/03/2013 1737    Lipid Panel     Component Value Date/Time   CHOL 114 10/29/2012 1634   TRIG 73 10/29/2012 1634   HDL 36* 10/29/2012 1634   CHOLHDL 3.2 10/29/2012 1634   VLDL 15 10/29/2012 1634   LDLCALC 63 10/29/2012 1634     RADIOLOGY: No results found.    ASSESSMENT AND PLAN: Matthew. Shamond Skelton is a 64 year old white male who is 7 years status post his inferior ST segment elevation myocardial infarction for which he underwent successful stenting of his RCA and stage cutting balloon to high grade LAD stenosis.  His blood pressure today is well-controlled on his current medical regimen consisting of Toprol-XL 12.5 mg, losartan 50 mg daily, isosorbide 90 mg at bedtime, HCTZ 12.5 mg in addition to amlodipine 5 mg.  He was noted have frequent PVCs on his recent emergency room evaluation and has PVCs noted today on examination.  I have recommended slight additional titration of his Toprol to 25 mg for the first week and as long as his resting pulse is above 55 he will further titrate this to 37.5 mg daily.  This should provide both ectopy suppression, continue blood pressure control, as well as anti-ischemic benefit.  He has noticed some sharp chest discomfort.  I am scheduling him for an echo Doppler study as well as a Lexiscan Myoview for further evaluation.  He continues to be on dual antiplatelet therapy with aspirin and  Plavix.  There is no bleeding.  In June he will undergo a one-year evaluation of his abdominal aortic aneurysm.  I reviewed his emergency room records.  I will see him in June in follow up of the above studies and further recommendations will be made at that time.     Time spent: 25 minutes  Troy Sine, MD, [facility]  09/19/2014 7:02 PM

## 2014-09-25 ENCOUNTER — Telehealth (HOSPITAL_COMMUNITY): Payer: Self-pay

## 2014-09-25 NOTE — Telephone Encounter (Signed)
Encounter complete. 

## 2014-09-30 ENCOUNTER — Ambulatory Visit (HOSPITAL_COMMUNITY)
Admission: RE | Admit: 2014-09-30 | Discharge: 2014-09-30 | Disposition: A | Discharge status: Home / Self Care | Payer: BLUE CROSS/BLUE SHIELD | Source: Ambulatory Visit | Attending: Cardiology | Admitting: Cardiology

## 2014-09-30 DIAGNOSIS — R55 Syncope and collapse: Secondary | ICD-10-CM | POA: Insufficient documentation

## 2014-09-30 DIAGNOSIS — R002 Palpitations: Secondary | ICD-10-CM | POA: Insufficient documentation

## 2014-09-30 DIAGNOSIS — R9431 Abnormal electrocardiogram [ECG] [EKG]: Secondary | ICD-10-CM | POA: Diagnosis not present

## 2014-09-30 DIAGNOSIS — I2510 Atherosclerotic heart disease of native coronary artery without angina pectoris: Secondary | ICD-10-CM | POA: Diagnosis not present

## 2014-09-30 DIAGNOSIS — R42 Dizziness and giddiness: Secondary | ICD-10-CM | POA: Diagnosis not present

## 2014-09-30 DIAGNOSIS — Z8249 Family history of ischemic heart disease and other diseases of the circulatory system: Secondary | ICD-10-CM | POA: Diagnosis not present

## 2014-09-30 DIAGNOSIS — R079 Chest pain, unspecified: Secondary | ICD-10-CM | POA: Insufficient documentation

## 2014-09-30 DIAGNOSIS — E785 Hyperlipidemia, unspecified: Secondary | ICD-10-CM | POA: Insufficient documentation

## 2014-09-30 DIAGNOSIS — R0602 Shortness of breath: Secondary | ICD-10-CM | POA: Insufficient documentation

## 2014-09-30 DIAGNOSIS — Z87891 Personal history of nicotine dependence: Secondary | ICD-10-CM | POA: Insufficient documentation

## 2014-09-30 DIAGNOSIS — I25810 Atherosclerosis of coronary artery bypass graft(s) without angina pectoris: Secondary | ICD-10-CM

## 2014-09-30 DIAGNOSIS — R0609 Other forms of dyspnea: Secondary | ICD-10-CM

## 2014-09-30 DIAGNOSIS — I10 Essential (primary) hypertension: Secondary | ICD-10-CM | POA: Insufficient documentation

## 2014-09-30 DIAGNOSIS — E663 Overweight: Secondary | ICD-10-CM | POA: Insufficient documentation

## 2014-09-30 MED ORDER — TECHNETIUM TC 99M SESTAMIBI GENERIC - CARDIOLITE
30.1000 | Freq: Once | INTRAVENOUS | Status: AC | PRN
  Administered 2014-09-30: 30.1 via INTRAVENOUS

## 2014-09-30 MED ORDER — REGADENOSON 0.4 MG/5ML IV SOLN
0.4000 mg | Freq: Once | INTRAVENOUS | Status: AC
  Administered 2014-09-30: 0.4 mg via INTRAVENOUS

## 2014-09-30 MED ORDER — TECHNETIUM TC 99M SESTAMIBI GENERIC - CARDIOLITE
10.4000 | Freq: Once | INTRAVENOUS | Status: AC | PRN
  Administered 2014-09-30: 10 via INTRAVENOUS

## 2014-09-30 NOTE — Progress Notes (Signed)
2D Echo Performed 09/30/2014    Calix Heinbaugh, RCS  

## 2014-09-30 NOTE — Procedures (Addendum)
[facility] [address] [place] [address] [phone]  Cardiology Nuclear Med Study  Matthew Essexddie R Hardgrave is a 64 y.o. male     MRN : [id number]     DOB: April 08, 1951  Procedure Date: 09/30/2014  Nuclear Med Background Indication for Stress Test:  Evaluation for Ischemia, Post Hospital and Abnormal EKG History:  COPD, Emphysema and CAD;MI-09/2007;STENT/PTCA;EF=48%;AAA;LBBB Cardiac Risk Factors: Carotid Disease, CVA, Family History - CAD, History of Smoking, Hypertension, LBBB, Lipids and Overweight  Symptoms:  Chest Pain, Diaphoresis, Dizziness, DOE, Fatigue, Near Syncope, Palpitations and SOB   Nuclear Pre-Procedure Caffeine/Decaff Intake:  7:00pm NPO After: 3:00am   IV Site: R Forearm  IV 0.9% NS with Angio Cath:  22g  Chest Size (in):  46" IV Started by: Reece LeaderAmanda Robbins, RN  Height: 5\' 8"  (1.727 m)  Cup Size: n/a  BMI:  Body mass index is 31.48 kg/(m^2). Weight:  207 lb (93.895 kg)   Tech Comments:  n/a    Nuclear Med Study 1 or 2 day study: 1 day  Stress Test Type:  Lexiscan  Order Authorizing Provider:  Nicki Guadalajarahomas Kelly, MD   Resting Radionuclide: Technetium 7518m Sestamibi  Resting Radionuclide Dose: 10.4 mCi   Stress Radionuclide:  Technetium 1618m Sestamibi  Stress Radionuclide Dose: 30.1 mCi           Stress Protocol Rest HR: 47 Stress HR: 60  Rest BP: 124/69 Stress BP: 146/60  Exercise Time (min): n/a METS: n/a          Dose of Adenosine (mg):  n/a Dose of Lexiscan: 0.4 mg  Dose of Atropine (mg): n/a Dose of Dobutamine: n/a mcg/kg/min (at max HR)  Stress Test Technologist: Esperanza Sheetserry-Marie Martin, CCT Nuclear Technologist:Elizabeth Young,CNMT   Rest Procedure:  Myocardial perfusion imaging was performed at rest 45 minutes following the intravenous administration of Technetium 1718m Sestamibi. Stress Procedure:  The patient received IV Lexiscan 0.4 mg over 15-seconds.  Technetium 3118m Sestamibi injected IV at 30-seconds.   There were no significant changes with Lexiscan.  Quantitative spect images were obtained after a 45 minute delay.  Transient Ischemic Dilatation (Normal <1.22):  1.08  QGS EDV:  173 ml QGS ESV:  95 ml LV Ejection Fraction: 45%    Rest ECG: Sinus bradycardia with PVC and LBBB.  Stress ECG: Uninteretable due to baseline LBBB  QPS Raw Data Images:  Acquisition technically good; LVE. Stress Images:  There is decreased uptake in the anterior wall. Rest Images:  There is decreased uptake in the anterior wall. Subtraction (SDS):  No evidence of ischemia.  Impression Exercise Capacity:  Lexiscan with no exercise. BP Response:  Normal blood pressure response. Clinical Symptoms:  There is dyspnea. ECG Impression:  Baseline:  LBBB.  EKG uninterpretable due to LBBB at rest and stress. Comparison with Prior Nuclear Study: Compared to 07/28/12, no change.   Overall Impression:  Low risk stress nuclear study with a small, mild, fixed anterior defect consistent with soft tissue attenuation; no ischemia.  LV Wall Motion:  Mild apical hypokinesis.   Olga MillersBrian Crenshaw, MD  09/30/2014 12:33 PM

## 2014-10-09 ENCOUNTER — Encounter: Payer: Self-pay | Admitting: *Deleted

## 2014-10-20 ENCOUNTER — Telehealth (HOSPITAL_COMMUNITY): Payer: Self-pay | Admitting: *Deleted

## 2014-10-30 ENCOUNTER — Encounter (INDEPENDENT_AMBULATORY_CARE_PROVIDER_SITE_OTHER): Payer: Self-pay

## 2014-10-30 ENCOUNTER — Ambulatory Visit (INDEPENDENT_AMBULATORY_CARE_PROVIDER_SITE_OTHER): Payer: BLUE CROSS/BLUE SHIELD | Admitting: Pulmonary Disease

## 2014-10-30 ENCOUNTER — Encounter: Payer: Self-pay | Admitting: Pulmonary Disease

## 2014-10-30 VITALS — BP 126/62 | HR 64 | Temp 97.4°F | Ht 71.0 in | Wt 205.0 lb

## 2014-10-30 DIAGNOSIS — I25810 Atherosclerosis of coronary artery bypass graft(s) without angina pectoris: Secondary | ICD-10-CM

## 2014-10-30 DIAGNOSIS — J438 Other emphysema: Secondary | ICD-10-CM

## 2014-10-30 DIAGNOSIS — R0609 Other forms of dyspnea: Secondary | ICD-10-CM | POA: Diagnosis not present

## 2014-10-30 MED ORDER — PREDNISONE 10 MG PO TABS: ORAL_TABLET | ORAL | Status: DC

## 2014-10-30 NOTE — Patient Instructions (Signed)
Stay on your anoro everyday Will start you on 8 days of prednisone to see if breathing improves.  Please call me when finished so we can discuss response and further plans. Will make sure your followup apptm is with Dr. Vassie LollAlva.  Can be same as scheduled if he is in office, or may need to be changed.

## 2014-10-30 NOTE — Assessment & Plan Note (Signed)
The patient feels he is having progressive dyspnea on exertion, but he only has a history of mild COPD and does not respond to a rescue inhaler with his episodes of dyspnea. He has had a recent echocardiogram that showed a drop in his ejection fraction from 60% to 40-45%, but his nuclear stress was considered low risk. I am willing to give the patient a short course of prednisone to see if he sees a significant improvement, and if he does, would consider adding inhaled corticosteroid-induced to his regimen. If he does not see a difference, then I suspect his worsening symptoms are coming from his cardiac status.

## 2014-10-30 NOTE — Assessment & Plan Note (Signed)
The patient feels that anoro has helped his breathing, but he has had progressive symptoms most recently. I suspect this is not coming from his mild underlying lung disease, but will give him a trial of prednisone to see if there is improvement.

## 2014-10-30 NOTE — Progress Notes (Signed)
   Subjective:    Patient ID: Matthew Evans, male    DOB: 1951/02/05, 64 y.o.   MRN: [id number]  HPI The patient comes in today for an acute sick visit. He has known mild COPD by prior PFTs, and has been staying compliantly on anoro. He has noticed increased shortness of breath that has been coming on slowly over the last few months, but it is clearly progressive. He is now getting winded with even moderate exertional activities, but denies any chest congestion or cough of significance. He does produce mucus first thing in the mornings, but usually clears and doesn't bother him the rest of the day. He has not seen improvement with using albuterol as needed. He has also had a recent cardiac workup, and is being followed closely by Dr. Tresa EndoKelly.   Review of Systems  Constitutional: Negative for fever and unexpected weight change.  HENT: Positive for congestion. Negative for dental problem, ear pain, nosebleeds, postnasal drip, rhinorrhea, sinus pressure, sneezing, sore throat and trouble swallowing.   Eyes: Negative for redness and itching.  Respiratory: Positive for cough, chest tightness and shortness of breath. Negative for wheezing.   Cardiovascular: Negative for palpitations and leg swelling.  Gastrointestinal: Negative for nausea and vomiting.  Genitourinary: Negative for dysuria.  Musculoskeletal: Negative for joint swelling.  Skin: Negative for rash.  Neurological: Negative for headaches.  Hematological: Does not bruise/bleed easily.  Psychiatric/Behavioral: Negative for dysphoric mood. The patient is not nervous/anxious.        Objective:   Physical Exam Overweight male in no acute distress Nose without purulence or discharge noted Neck without lymphadenopathy or thyromegaly Chest with mildly decreased breath sounds, no active wheezing Cardiac exam with regular rate and rhythm Lower extremities without edema, no cyanosis Alert and oriented, moves all 4 extremities.         Assessment & Plan:

## 2014-11-05 ENCOUNTER — Ambulatory Visit: Payer: BLUE CROSS/BLUE SHIELD | Admitting: Cardiovascular Disease

## 2014-11-09 ENCOUNTER — Telehealth: Payer: Self-pay | Admitting: Pulmonary Disease

## 2014-11-09 MED ORDER — BECLOMETHASONE DIPROPIONATE 80 MCG/ACT IN AERS: 2.0000 | INHALATION_SPRAY | Freq: Two times a day (BID) | RESPIRATORY_TRACT | Status: DC

## 2014-11-09 NOTE — Telephone Encounter (Signed)
Wife states [facility] and then report back how he was doing; this way KC would give him a steroid/pred inhaler. Wife states patient felt much better on the pred taper and would like to proceed with inhaler. KC please advise. Thanks.

## 2014-11-09 NOTE — Telephone Encounter (Signed)
LMTCB x 1 

## 2014-11-09 NOTE — Telephone Encounter (Signed)
Spouse aware of recs. RX sent in. Nothing further needed 

## 2014-11-09 NOTE — Telephone Encounter (Signed)
[phone] (M)  Wife calling back again

## 2014-11-09 NOTE — Telephone Encounter (Signed)
Have him try Qvar 80, 2 inhalations am and pm.  Keep mouth rinsed out.

## 2014-11-19 ENCOUNTER — Ambulatory Visit (HOSPITAL_COMMUNITY)
Admission: RE | Admit: 2014-11-19 | Discharge: 2014-11-19 | Disposition: A | Discharge status: Home / Self Care | Payer: BLUE CROSS/BLUE SHIELD | Source: Ambulatory Visit | Attending: Cardiology | Admitting: Cardiology

## 2014-11-19 ENCOUNTER — Other Ambulatory Visit: Payer: Self-pay | Admitting: Cardiovascular Disease

## 2014-11-19 DIAGNOSIS — I714 Abdominal aortic aneurysm, without rupture, unspecified: Secondary | ICD-10-CM

## 2014-11-23 ENCOUNTER — Ambulatory Visit (INDEPENDENT_AMBULATORY_CARE_PROVIDER_SITE_OTHER): Payer: BLUE CROSS/BLUE SHIELD | Admitting: Cardiovascular Disease

## 2014-11-23 ENCOUNTER — Ambulatory Visit: Payer: Medicare Other | Admitting: Cardiovascular Disease

## 2014-11-23 ENCOUNTER — Encounter: Payer: Self-pay | Admitting: Cardiovascular Disease

## 2014-11-23 VITALS — BP 98/72 | HR 52 | Ht 68.0 in | Wt 202.9 lb

## 2014-11-23 DIAGNOSIS — I714 Abdominal aortic aneurysm, without rupture, unspecified: Secondary | ICD-10-CM

## 2014-11-23 DIAGNOSIS — I119 Hypertensive heart disease without heart failure: Secondary | ICD-10-CM

## 2014-11-23 DIAGNOSIS — I2510 Atherosclerotic heart disease of native coronary artery without angina pectoris: Secondary | ICD-10-CM | POA: Diagnosis not present

## 2014-11-23 DIAGNOSIS — I447 Left bundle-branch block, unspecified: Secondary | ICD-10-CM | POA: Insufficient documentation

## 2014-11-23 DIAGNOSIS — I2583 Coronary atherosclerosis due to lipid rich plaque: Secondary | ICD-10-CM

## 2014-11-23 MED ORDER — METOPROLOL SUCCINATE ER 25 MG PO TB24: 25.0000 mg | ORAL_TABLET | Freq: Every day | ORAL | Status: DC

## 2014-11-23 NOTE — Patient Instructions (Signed)
Your physician has recommended you make the following change in your medication: decrease the toprol XL from 1 & 1/2 tablet daily to 1 tablet daily (25 mg).  Your physician wants you to follow-up in: 6 months or sooner if needed. You will receive a reminder letter in the mail two months in advance. If you don't receive a letter, please call our office to schedule the follow-up appointment.

## 2014-11-23 NOTE — Progress Notes (Signed)
Patient ID: Matthew Evans, male   DOB: Apr 22, 1951, 64 y.o.   MRN: [id number]      HPI: Matthew Evans, is a 64 y.o. male who presents to the office today for a 56-monthcardiology followup evaluation.    Matthew Evans an ST segment elevation inferior MI and underwent RCA stenting in April 2009. He underwent staged cutting balloon to a high grade LAD stenosis which involved multiple branches and stenting was not done to reduce potential for jailing of his multiple branches. In August 2010 he was found to have 50-60% stenosis beyond his RCA stent in the LAD intervention site remained patent. He has a documented abdominal aortic aneurysm and a followup evaluation with Doppler imaging  on 08/29/2012 was essentially unchanged from one year previously and showed a measurement of 3.4x3.7 cm.  Additional problems include COPD, hypertension, hypothyroidism, hyperlipidemia, GERD. His last nuclear study in February 2014 remained low risk and demonstrated mild apical hypokinesis with low normal wall motion without ischemia. An echo Doppler study in February 2014 showed mild LVH with grade 1 diastolic dysfunction and normal systolic function. He did have aortic valve thickening without stenosis and mild left atrial dilatation. Had normal pulmonary pressures.  He has a history of palpitations which has been treated with beta blocker therapy.  Recently, he has been taking this at a reduced dose of just 12.5 mg daily.  He does note occasional wheezing.  He has COPD. He recently has been on Anora in addition to when necessary albuterol.  Last year he underwent a Nissen fundoplication hiatal hernia surgery by Dr. RRosendo Grosin May, 2015.  Since that time, he has lost approximately 20 pounds.  In 2015 A follow-up abdominal aortic ultrasound his revealed 3.6 x 3.3 cm with a small amount of atherosclerosis visualized, which was not hemodynamically significant.  This was not significantly changed from one  year previously.  When I last saw him, he had a recent emergency room evaluation on 09/15/2014.  He had been working aggressively at home in his yard intensely and overdid it.  He presented with presyncope and also had mild dyspnea.  He had noticed some mild dyspnea on exertion for the past several months.  In the emergency room he was noted to have an irregular heart rate but sinus rhythm with frequent PVCs.  PVCs were not conducted so his resting pulse radially was bradycardic.    He underwent an echo Doppler as well as subsequent nuclear perfusion studies which were done on 09/30/2014.  His echo Doppler study showed an EF of 40-45% with diffuse hypokinesis and grade 2 diastolic dysfunction.  There was mitral annular calcification.  His left atrium was mildly dilated.  Right ventricle was normal.  His nuclear perfusion study was interpreted as low risk.  He has left bundle branch block.  There was a small mild fixed anterior defect consistent with soft tissue attenuation versus possible left bundle branch block abnormality.  There was no ischemia.  He admits to occasional stinky sensation of chest pain.  He denies chest pressure.  He does note fatigue.  He underwent an abdominal aortic ultrasound evaluation of his abdominal aortic aneurysm several days ago.  These results are still pending.  Past Medical History  Diagnosis Date  . Myocardial infarction 09/2007    PCI to RCA  . Carotid artery occlusion   . AAA (abdominal aortic aneurysm)     mild; doppler 08/29/12-3.4x3.7  . Thyroid disease  hypothyroidism  . Emphysema   . Mini stroke   . Lupus     [name], [name]  . Memory loss     d/t mini stroke  . Evans (coronary artery disease)     echo 07/26/12-EF 55-60%; myoview 07/26/12-low normal wall motion with mild apical hypokinesis as well a septal wall motion consistent with LBBB  . HTN (hypertension)   . Hyperlipidemia   .  GERD (gastroesophageal reflux disease)   . H/O hiatal hernia   . Headache(784.0)   . Arthritis   . Sleep apnea     sleep study 12/14/06 ahi 16.89, during rem 12.3, supine ahi 57.49; cpap 02/22/07-c-flex of 3 at 13cm h2o; PT STATES HE DOES NOT USE HIS CPAP EVERY NIGHT  . Stroke     PT STATES NEUROLOGIST TOLD HIM HE HAD HAD MINI STROKE  . Shortness of breath     WITH EXERTION  . Hypothyroidism     HX OF RADIOACTIVE IODINE   . History of palpitations     Past Surgical History  Procedure Laterality Date  . Coronary stent placement  09/2007    right  . Balloon atherotomy      LAD  . Cardiac catheterization  08/11/11    EF50-55%, small AAA, no signifiant stent restenosis in RCA , no restenosis of PTCA of LAD, medical therapy  . Cardiac catheterization  09/27/06    cutting balloon arthrotomy of LAD stenosis with dilatation of a 3.5x64m cutting balloon  . Coronary angioplasty with stent placement  09/24/06    PTCA/stent of RCA with a 3.5x263mTaxus DES post dilated to 3.9646m. Cardiac catheterization  01/07/09    50-60% progressive dz beyond the stented segment in the RCA , LAD intervention remained patent  . Esophageal manometry N/A 08/04/2013    Procedure: ESOPHAGEAL MANOMETRY (EM);  Surgeon: PatBeryle BeamsD;  Location: [facility];  Service: Endoscopy;  Laterality: N/A;  . Left heart catheterization with coronary angiogram N/A 08/11/2011    Procedure: LEFT HEART CATHETERIZATION WITH CORONARY ANGIOGRAM;  Surgeon: ThoTroy SineD;  [facility];  Service: Cardiovascular;  Laterality: N/A;    Allergies  Allergen Reactions  . Niaspan [Niacin] Rash  . Septra [Sulfamethoxazole-Trimethoprim] Rash  . Simcor [Niacin-Simvastatin Er] Rash  . Zocor [Simvastatin] Rash    Current Outpatient Prescriptions  Medication Sig Dispense Refill  . Albuterol Sulfate (PROAIR RESPICLICK) [id number] BASE) MCG/ACT AEPB Inhale 2 puffs into the lungs every 6 (six) hours as needed. (Patient taking  differently: Inhale 2 puffs into the lungs every 6 (six) hours as needed (shortness of breath). ) 1 each 6  . amLODipine (NORVASC) 5 MG tablet TAKE 1 TABLET (5 MG TOTAL) BY MOUTH EVERY MORNING. 30 tablet 10  . ANORO ELLIPTA 62.5-25 MCG/INH AEPB INHALE 1 PUFF INTO THE LUNGS DAILY. 60 each 3  . aspirin EC 81 MG tablet Take 81 mg by mouth daily.      . beclomethasone (QVAR) 80 MCG/ACT inhaler Inhale 2 puffs into the lungs 2 (two) times daily. 1 Inhaler 6  . clopidogrel (PLAVIX) 75 MG tablet TAKE 1 TABLET (75 MG TOTAL) BY MOUTH DAILY WITH BREAKFAST. 30 tablet 10  . cyclobenzaprine (FLEXERIL) 10 MG tablet Take 10 mg by mouth 2 (two) times daily as needed for muscle spasms.   0  . hydrochlorothiazide (MICROZIDE) 12.5 MG capsule Take 1 capsule (12.5 mg total)  by mouth every other day. 30 capsule 3  . HYDROcodone-acetaminophen (NORCO/VICODIN) 5-325 MG per tablet Take 1 tablet by mouth 3 (three) times daily as needed for moderate pain or severe pain.   0  . isosorbide mononitrate (IMDUR) 60 MG 24 hr tablet Take 1.5 tablets (90 mg total) by mouth at bedtime. Takes 1 and 1/2 (Patient taking differently: Take 60 mg by mouth at bedtime. Takes 1 and 1/2) 45 tablet 9  . levothyroxine (SYNTHROID, LEVOTHROID) 175 MCG tablet Take 175 mcg by mouth daily before breakfast.    . losartan (COZAAR) 50 MG tablet TAKE 1 TABLET (50 MG TOTAL) BY MOUTH DAILY. (Patient taking differently: TAKE 1 TABLET (50 MG TOTAL) BY MOUTH DAILY AT BEDTIME) 90 tablet 2  . meloxicam (MOBIC) 15 MG tablet Take 15 mg by mouth daily.  5  . metoprolol succinate (TOPROL XL) 25 MG 24 hr tablet Take 1 tablet (25 mg total) by mouth daily. 45 tablet 6  . Omega-3 Fatty Acids (FISH OIL) 1200 MG CAPS Take 2 capsules by mouth 2 (two) times daily.     No current facility-administered medications for this visit.    Socially he is married has 3 children. There is a remote tobacco history. He has tried to limit his sodium intake.  His wife also is my patient  was diagnosed with recent breast cancer.  ROS General: Negative; No fevers, chills, or night sweats;  HEENT: Negative; No changes in vision or hearing, sinus congestion, difficulty swallowing Pulmonary: Positive for COPD, with occasional wheezing;  mild shortness of breath with uphill walking Cardiovascular: See history of present illness GI: Hiatal hernia symptoms has resolved  since his Nissen fundoplication; No nausea, vomiting, diarrhea, or abdominal pain GU: Negative; No dysuria, hematuria, or difficulty voiding Musculoskeletal: Negative; no myalgias, joint pain, or weakness Hematologic/Oncology: Negative; no easy bruising, bleeding Endocrine: Negative; no heat/cold intolerance; no diabetes Neuro: Negative; no changes in balance, headaches Skin: Negative; No rashes or skin lesions Psychiatric: Negative; No behavioral problems, depression Sleep: Negative; No snoring, daytime sleepiness, hypersomnolence, bruxism, restless legs, hypnogognic hallucinations, no cataplexy Other comprehensive 14 point system review is negative.   PE BP 98/72 mmHg  Pulse 52  Ht '5\' 8"'  (1.727 m)  Wt 202 lb 14.4 oz (92.035 kg)  BMI 30.86 kg/m2  Repeat blood pressure 124/70 Wt Readings from Last 3 Encounters:  11/23/14 202 lb 14.4 oz (92.035 kg)  10/30/14 205 lb (92.987 kg)  09/30/14 207 lb (93.895 kg)   General: Alert, oriented, no distress HEENT: Normocephalic, atraumatic. Pupils round and reactive; sclera anicteric;  Nose without nasal septal hypertrophy Mouth/Parynx benign; Mallinpatti scale 2/3 Neck: No JVD, no carotid bruits .  Normal carotid upstroke Lungs: decreased breath sounds; no wheezing or rales Heart: RRR, s1 s2 normal 1/6 sem; no S3 or S4 gallop; no diastolic murmur; no rubs thrills or heaves Abdomen: soft, nontender; no hepatosplenomehaly, BS+; abdominal aorta nontender and not dilated by palpation.  Back: No CVA tenderness  Moderate central adiposity Pulses 2+ Extremities: no  clubbinbg cyanosis or edema, Homan's sign negative  Neurologic: grossly nonfocal Psychological: Normal affect and mood  ECG (independently read by me): Sinus bradycardia 52 bpm.  Left bundle branch block with repolarization changes.  April 2016 ECG (independently read by me): Sinus rhythm at 62 bpm.  PVC.  QTc interval 475 ms.  September 2015 ECG (independently read by me): Sinus bradycardia at 46 beats per minute.  Left bundle branch block with repolarization changes  05/13/2013 ECG: Sinus  bradycardia 50 beats per minute. beats per minute with mild left axis deviation and left bundle branch block. There is complete resolution of prior ectopy.   LABS   BMP Latest Ref Rng 09/14/2014 11/03/2013 10/02/2013  Glucose 70 - 99 mg/dL 90 101(H) 87  BUN 6 - 23 mg/dL '17 22 15  ' Creatinine 0.50 - 1.35 mg/dL 1.39(H) 1.4 1.26  Sodium 135 - 145 mmol/L 140 142 140  Potassium 3.5 - 5.1 mmol/L 4.6 4.9 4.2  Chloride 96 - 112 mmol/L 105 106 99  CO2 19 - 32 mmol/L '24 29 30  ' Calcium 8.4 - 10.5 mg/dL 9.1 9.5 9.4   Hepatic Function Panel   Hepatic Function Latest Ref Rng 10/29/2012 10/29/2012 03/31/2011  Total Protein 6.0 - 8.3 g/dL 6.0 6.3 7.4  Albumin 3.5 - 5.2 g/dL 3.8 3.8 3.6  AST 0 - 37 U/L '15 16 27  ' ALT 0 - 53 U/L '11 12 22  ' Alk Phosphatase 39 - 117 U/L 79 81 97  Total Bilirubin 0.3 - 1.2 mg/dL 0.5 0.5 0.8    CBC Latest Ref Rng 09/14/2014 10/02/2013 10/29/2012  WBC 4.0 - 10.5 K/uL 9.9 7.2 5.2  Hemoglobin 13.0 - 17.0 g/dL 15.5 14.8 13.8  Hematocrit 39.0 - 52.0 % 46.6 44.5 40.8  Platelets 150 - 400 K/uL 235 254 255   Lab Results  Component Value Date   TSH 5.349* 10/29/2012    BNP    Component Value Date/Time   PROBNP 20.0 11/03/2013 1737    Lipid Panel     Component Value Date/Time   CHOL 114 10/29/2012 1634   TRIG 73 10/29/2012 1634   HDL 36* 10/29/2012 1634   CHOLHDL 3.2 10/29/2012 1634   VLDL 15 10/29/2012 1634   LDLCALC 63 10/29/2012 1634     RADIOLOGY: No results  found.    ASSESSMENT AND PLAN: Matthew. Matthew Evans is a 64 year old white male who is 7 years status post his inferior ST segment elevation myocardial infarction for which he underwent successful stenting of his RCA and stage cutting balloon to high grade LAD stenosis.  I reviewed his recent nuclear perfusion study as well as his echo Doppler study with him in detail.  His nuclear perfusion study is low risk and shows probable attenuation artifact versus possible left bundle branch block attenuation.  Importantly, there was no evidence for ischemia.  Ejection fraction was 45%, which corroborates with his EF estimate on his echo Doppler study.  I suspect this may be contributed by his left bundle branch block rhythm.  He does note fatigue and he is bradycardic on exam.  I recommended he reduce his Toprol-XL from his present dose of 37.5 mg to just 25 mg daily.  I do not feel his stinging chest pain is ischemic in etiology.  I will contact him when the results of his abdominal aortic aneurysm duplex study is complete for further evaluation of his documented aneurysm.  I will see him in 6 months for reevaluation or sooner if problems arise  Time spent: 25 minutes  Troy Sine, MD, [facility]  11/23/2014 6:00 PM

## 2014-11-30 ENCOUNTER — Encounter: Payer: Self-pay | Admitting: *Deleted

## 2014-11-30 NOTE — Addendum Note (Signed)
Addended by: Willia CrazeMANCEBO, Tarance Balan on: 11/30/2014 12:06 PM   Modules accepted: Orders

## 2015-02-28 ENCOUNTER — Other Ambulatory Visit: Payer: Self-pay | Admitting: Cardiovascular Disease

## 2015-03-01 NOTE — Telephone Encounter (Signed)
Rx request sent to pharmacy.  

## 2015-04-05 DIAGNOSIS — L568 Other specified acute skin changes due to ultraviolet radiation: Secondary | ICD-10-CM | POA: Diagnosis not present

## 2015-04-05 DIAGNOSIS — M3310 Other dermatopolymyositis, organ involvement unspecified: Secondary | ICD-10-CM | POA: Diagnosis not present

## 2015-04-19 DIAGNOSIS — E032 Hypothyroidism due to medicaments and other exogenous substances: Secondary | ICD-10-CM | POA: Diagnosis not present

## 2015-04-19 DIAGNOSIS — B029 Zoster without complications: Secondary | ICD-10-CM | POA: Diagnosis not present

## 2015-04-20 ENCOUNTER — Other Ambulatory Visit: Payer: Self-pay | Admitting: Cardiovascular Disease

## 2015-04-27 ENCOUNTER — Other Ambulatory Visit: Payer: Self-pay | Admitting: Cardiovascular Disease

## 2015-05-26 ENCOUNTER — Telehealth: Payer: Self-pay | Admitting: Pulmonary Disease

## 2015-05-26 ENCOUNTER — Encounter: Payer: Self-pay | Admitting: Cardiovascular Disease

## 2015-05-26 ENCOUNTER — Ambulatory Visit (INDEPENDENT_AMBULATORY_CARE_PROVIDER_SITE_OTHER): Payer: BLUE CROSS/BLUE SHIELD | Admitting: Cardiovascular Disease

## 2015-05-26 VITALS — BP 130/82 | HR 58 | Ht 68.0 in | Wt 210.0 lb

## 2015-05-26 DIAGNOSIS — I2510 Atherosclerotic heart disease of native coronary artery without angina pectoris: Secondary | ICD-10-CM

## 2015-05-26 DIAGNOSIS — J449 Chronic obstructive pulmonary disease, unspecified: Secondary | ICD-10-CM | POA: Diagnosis not present

## 2015-05-26 DIAGNOSIS — I119 Hypertensive heart disease without heart failure: Secondary | ICD-10-CM | POA: Diagnosis not present

## 2015-05-26 DIAGNOSIS — I447 Left bundle-branch block, unspecified: Secondary | ICD-10-CM

## 2015-05-26 DIAGNOSIS — I2583 Coronary atherosclerosis due to lipid rich plaque: Secondary | ICD-10-CM

## 2015-05-26 DIAGNOSIS — I714 Abdominal aortic aneurysm, without rupture, unspecified: Secondary | ICD-10-CM

## 2015-05-26 DIAGNOSIS — E785 Hyperlipidemia, unspecified: Secondary | ICD-10-CM | POA: Diagnosis not present

## 2015-05-26 NOTE — Patient Instructions (Signed)
Your physician wants you to follow-up in: 6 months or sooner if needed. You will receive a reminder letter in the mail two months in advance. If you don't receive a letter, please call our office to schedule the follow-up appointment.  If you need a refill on your cardiac medications before your next appointment, please call your pharmacy.  You have been referred to Dr. Vassie LollAlva

## 2015-05-26 NOTE — Telephone Encounter (Signed)
lmtcb x1 for pt's wife. Needs to make appointment with another provider ([facility] pt).

## 2015-05-26 NOTE — Progress Notes (Signed)
Patient ID: Matthew Evans, male   DOB: 14-Aug-1950, 64 y.o.   MRN: [id number]      HPI: Matthew Evans, is a 64 y.o. male who presents to the office today for a 26-monthcardiology followup evaluation.    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 He underwent staged cutting balloon to a high grade LAD stenosis which involved multiple branches and stenting was not done to reduce potential for jailing of his multiple branches. In August 2010 he was found to have 50-60% stenosis beyond his RCA stent in the LAD intervention site remained patent. He has a documented abdominal aortic aneurysm and a followup evaluation with Doppler imaging  on 08/29/2012 was essentially unchanged from one year previously and showed a measurement of 3.4x3.7 cm.  Additional problems include COPD, hypertension, hypothyroidism, hyperlipidemia, GERD. His last nuclear study in February 2014 remained low risk and demonstrated mild apical hypokinesis with low normal wall motion without ischemia. An echo Doppler study in February 2014 showed mild LVH with grade 1 diastolic dysfunction and normal systolic function. He did have aortic valve thickening without stenosis and mild left atrial dilatation. Had normal pulmonary pressures.  He has a history of palpitations which has been treated with beta blocker therapy.  Recently, he has been taking this at a reduced dose of just 12.5 mg daily.  He does note occasional wheezing.  He has COPD. He recently has been on Anora in addition to when necessary albuterol.  He underwent a Nissen fundoplication hiatal hernia surgery by Dr. RRosendo Grosin May, 2015.  Since that time, he has lost approximately 20 pounds.  In 2015 A follow-up abdominal aortic ultrasound his revealed 3.6 x 3.3 cm with a small amount of atherosclerosis visualized, which was not hemodynamically significant.  This was not significantly changed from one year  previously.  He had an emergency room evaluation on 09/15/2014.  He had been working aggressively at home in his yard intensely and overdid it.  He presented with presyncope and also had mild dyspnea.  He had noticed some mild dyspnea on exertion for the past several months.  In the emergency room he was noted to have an irregular heart rate but sinus rhythm with frequent PVCs.  PVCs were not conducted so his resting pulse radially was bradycardic.    On 09/30/2014 an echo Doppler showed an EF of 40-45% with diffuse hypokinesis and grade 2 diastolic dysfunction.  There was mitral annular calcification.  His left atrium was mildly dilated.  Right ventricle was normal.  A nuclear perfusion study done the same day was interpreted as low risk.  He has left bundle branch block.  There was a small mild fixed anterior defect consistent with soft tissue attenuation versus possible left bundle branch block abnormality.  There was no ischemia.  Since I last saw him, a follow-up abdominal ultrasound showed an aortic aneurysm of 3.7 x 3.5, which was not significantly changed.  At times, he has noticed rare episodes of chest pain but typically most of his symptoms are related to shortness of breath with activity.  He was supposed to be taking isosorbide 90 mg daily, but has only been taking 60 mg.  When I had last seen him, I reduced his Toprol dose to 25 mg from 50 mg, which has improved his energy.  He has seen Dr. CGwenette Greetfor pulmonary evaluation and will be transitioning to Dr. AElsworth Sohosince  Dr. Gwenette Greet has moved to the [facility]. He presents for follow up evaluation  Past Medical History  Diagnosis Date  . Myocardial infarction ([place]) 09/2007    PCI to RCA  . Carotid artery occlusion   . AAA (abdominal aortic aneurysm) ([facility])     mild; doppler 08/29/12-3.4x3.7  . Thyroid disease     hypothyroidism  . Emphysema   . Mini stroke ([place])   . Lupus ([facility])     [name], BACK AND FACE AT  TIMES- TAKES PLACQUENIL TO TREAT  . Memory loss     d/t mini stroke  . CAD (coronary artery disease)     echo 07/26/12-EF 55-60%; myoview 07/26/12-low normal wall motion with mild apical hypokinesis as well a septal wall motion consistent with LBBB  . HTN (hypertension)   . Hyperlipidemia   . GERD (gastroesophageal reflux disease)   . H/O hiatal hernia   . Headache(784.0)   . Arthritis   . Sleep apnea     sleep study 12/14/06 ahi 16.89, during rem 12.3, supine ahi 57.49; cpap 02/22/07-c-flex of 3 at 13cm h2o; PT STATES HE DOES NOT USE HIS CPAP EVERY NIGHT  . Stroke [facility])     PT STATES NEUROLOGIST TOLD HIM HE HAD HAD MINI STROKE  . Shortness of breath     WITH EXERTION  . Hypothyroidism     HX OF RADIOACTIVE IODINE   . History of palpitations     Past Surgical History  Procedure Laterality Date  . Coronary stent placement  09/2007    right  . Balloon atherotomy      LAD  . Cardiac catheterization  08/11/11    EF50-55%, small AAA, no signifiant stent restenosis in RCA , no restenosis of PTCA of LAD, medical therapy  . Cardiac catheterization  09/27/06    cutting balloon arthrotomy of LAD stenosis with dilatation of a 3.5x84m cutting balloon  . Coronary angioplasty with stent placement  09/24/06    PTCA/stent of RCA with a 3.5x235mTaxus DES post dilated to 3.9676m. Cardiac catheterization  01/07/09    50-60% progressive dz beyond the stented segment in the RCA , LAD intervention remained patent  . Esophageal manometry N/A 08/04/2013    Procedure: ESOPHAGEAL MANOMETRY (EM);  Surgeon: PatBeryle BeamsD;  Location: [facility];  Service: Endoscopy;  Laterality: N/A;  . Left heart catheterization with coronary angiogram N/A 08/11/2011    Procedure: LEFT HEART CATHETERIZATION WITH CORONARY ANGIOGRAM;  Surgeon: ThoTroy SineD;  [facility];  Service: Cardiovascular;  Laterality: N/A;    Allergies  Allergen Reactions  . Niaspan [Niacin] Rash  . Septra  [Sulfamethoxazole-Trimethoprim] Rash  . Simcor [Niacin-Simvastatin Er] Rash  . Zocor [Simvastatin] Rash    Current Outpatient Prescriptions  Medication Sig Dispense Refill  . Albuterol Sulfate (PROAIR RESPICLICK) [id number] BASE) MCG/ACT AEPB Inhale 2 puffs into the lungs every 6 (six) hours as needed. (Patient taking differently: Inhale 2 puffs into the lungs every 6 (six) hours as needed (shortness of breath). ) 1 each 6  . amLODipine (NORVASC) 5 MG tablet TAKE 1 TABLET (5 MG TOTAL) BY MOUTH EVERY MORNING. 30 tablet 10  . ANORO ELLIPTA 62.5-25 MCG/INH AEPB INHALE 1 PUFF INTO THE LUNGS DAILY. 60 each 3  . aspirin EC 81 MG tablet Take 81 mg by mouth daily.      . beclomethasone (QVAR) 80 MCG/ACT inhaler Inhale 2 puffs into the lungs 2 (  two) times daily. 1 Inhaler 6  . clopidogrel (PLAVIX) 75 MG tablet TAKE 1 TABLET (75 MG TOTAL) BY MOUTH DAILY WITH BREAKFAST. 30 tablet 10  . cyclobenzaprine (FLEXERIL) 10 MG tablet Take 10 mg by mouth 2 (two) times daily as needed for muscle spasms.   0  . hydrochlorothiazide (MICROZIDE) 12.5 MG capsule TAKE 1 CAPSULE (12.5 MG TOTAL) BY MOUTH EVERY OTHER DAY. 30 capsule 2  . HYDROcodone-acetaminophen (NORCO/VICODIN) 5-325 MG per tablet Take 1 tablet by mouth 3 (three) times daily as needed for moderate pain or severe pain.   0  . isosorbide mononitrate (IMDUR) 60 MG 24 hr tablet TAKE 1 &1/2 TABLETS (90 MG ) BY MOUTH AT BEDTIME. 45 tablet 10  . levothyroxine (SYNTHROID, LEVOTHROID) 175 MCG tablet Take 175 mcg by mouth daily before breakfast.    . losartan (COZAAR) 50 MG tablet TAKE 1 TABLET (50 MG TOTAL) BY MOUTH DAILY. 90 tablet 1  . meloxicam (MOBIC) 15 MG tablet Take 15 mg by mouth daily.  5  . metoprolol succinate (TOPROL XL) 25 MG 24 hr tablet Take 1 tablet (25 mg total) by mouth daily. 45 tablet 6  . Omega-3 Fatty Acids (FISH OIL) 1200 MG CAPS Take 2 capsules by mouth 2 (two) times daily.     No current facility-administered medications for this visit.     Socially he is married has 3 children. There is a remote tobacco history. He has tried to limit his sodium intake.  His wife also is my patient was diagnosed with recent breast cancer.  ROS General: Negative; No fevers, chills, or night sweats;  HEENT: Negative; No changes in vision or hearing, sinus congestion, difficulty swallowing Pulmonary: Positive for COPD, with occasional wheezing;  mild shortness of breath with uphill walking Cardiovascular: See history of present illness GI: Hiatal hernia symptoms has resolved  since his Nissen fundoplication; No nausea, vomiting, diarrhea, or abdominal pain GU: Negative; No dysuria, hematuria, or difficulty voiding Musculoskeletal: Negative; no myalgias, joint pain, or weakness Hematologic/Oncology: Negative; no easy bruising, bleeding Endocrine: Negative; no heat/cold intolerance; no diabetes Neuro: Negative; no changes in balance, headaches; occasional numbness in his hands. Skin: Negative; No rashes or skin lesions Psychiatric: Negative; No behavioral problems, depression Sleep: Negative; No snoring, daytime sleepiness, hypersomnolence, bruxism, restless legs, hypnogognic hallucinations, no cataplexy Other comprehensive 14 point system review is negative.   PE BP 130/82 mmHg  Pulse 58  Ht '5\' 8"'  (1.727 m)  Wt 210 lb (95.255 kg)  BMI 31.94 kg/m2  Repeat blood pressure 124/70 Wt Readings from Last 3 Encounters:  05/26/15 210 lb (95.255 kg)  11/23/14 202 lb 14.4 oz (92.035 kg)  10/30/14 205 lb (92.987 kg)   General: Alert, oriented, no distress HEENT: Normocephalic, atraumatic. Pupils round and reactive; sclera anicteric;  Nose without nasal septal hypertrophy Mouth/Parynx benign; Mallinpatti scale 2/3 Neck: No JVD, no carotid bruits .  Normal carotid upstroke Lungs: decreased breath sounds; no wheezing or rales Heart: RRR, s1 s2 normal 1/6 sem; no S3 or S4 gallop; no diastolic murmur; no rubs thrills or heaves Abdomen: soft,  nontender; no hepatosplenomehaly, BS+; abdominal aorta nontender and not dilated by palpation.  Back: No CVA tenderness  Moderate central adiposity Pulses 2+ Extremities: no clubbinbg cyanosis or edema, Homan's sign negative  Neurologic: grossly nonfocal Psychological: Normal affect and mood  ECG (independently read by me): Sinus bradycardia 58 bpm.  Left bundle branch block.  PR interval 148 ms.    June 2016 ECG (independently read by  me): Sinus bradycardia 52 bpm.  Left bundle branch block with repolarization changes.  April 2016 ECG (independently read by me): Sinus rhythm at 62 bpm.  PVC.  QTc interval 475 ms.  September 2015 ECG (independently read by me): Sinus bradycardia at 46 beats per minute.  Left bundle branch block with repolarization changes  05/13/2013 ECG: Sinus bradycardia 50 beats per minute. beats per minute with mild left axis deviation and left bundle branch block. There is complete resolution of prior ectopy.   LABS  BMP Latest Ref Rng 09/14/2014 11/03/2013 10/02/2013  Glucose 70 - 99 mg/dL 90 101(H) 87  BUN 6 - 23 mg/dL '17 22 15  ' Creatinine 0.50 - 1.35 mg/dL 1.39(H) 1.4 1.26  Sodium 135 - 145 mmol/L 140 142 140  Potassium 3.5 - 5.1 mmol/L 4.6 4.9 4.2  Chloride 96 - 112 mmol/L 105 106 99  CO2 19 - 32 mmol/L '24 29 30  ' Calcium 8.4 - 10.5 mg/dL 9.1 9.5 9.4     Hepatic Function Latest Ref Rng 10/29/2012 10/29/2012 03/31/2011  Total Protein 6.0 - 8.3 g/dL 6.0 6.3 7.4  Albumin 3.5 - 5.2 g/dL 3.8 3.8 3.6  AST 0 - 37 U/L '15 16 27  ' ALT 0 - 53 U/L '11 12 22  ' Alk Phosphatase 39 - 117 U/L 79 81 97  Total Bilirubin 0.3 - 1.2 mg/dL 0.5 0.5 0.8    CBC Latest Ref Rng 09/14/2014 10/02/2013 10/29/2012  WBC 4.0 - 10.5 K/uL 9.9 7.2 5.2  Hemoglobin 13.0 - 17.0 g/dL 15.5 14.8 13.8  Hematocrit 39.0 - 52.0 % 46.6 44.5 40.8  Platelets 150 - 400 K/uL 235 254 255   Lab Results  Component Value Date   TSH 5.349* 10/29/2012    BNP    Component Value Date/Time   PROBNP 20.0  11/03/2013 1737    Lipid Panel     Component Value Date/Time   CHOL 114 10/29/2012 1634   TRIG 73 10/29/2012 1634   HDL 36* 10/29/2012 1634   CHOLHDL 3.2 10/29/2012 1634   VLDL 15 10/29/2012 1634   LDLCALC 63 10/29/2012 1634     RADIOLOGY: No results found.    ASSESSMENT AND PLAN: Matthew. Matthew Evans is a 64 year old white male who is 7 years status post his inferior ST segment elevation myocardial infarction for which he underwent successful stenting of his RCA and staged cutting balloon to high grade LAD stenosis.  Earlier this year he had a nuclear perfusion study as well as his echo Doppler study.  His nuclear perfusion study ws low risk and shows probable attenuation artifact versus possible left bundle branch block attenuation.  Importantly, there was no evidence for ischemia.  Ejection fraction was 45%, which corroborates with his EF estimate on his echo Doppler study.  I suspect this may be contributed by his left bundle branch block rhythm.  And I last saw him he was complaining of fatigue and was bradycardic on exam.  His resting pulse is no improved at 58 on the results reduced dose of Toprol.  He has experienced one episode of chest pressure in June.  He definitely notes exertional shortness of breath, which he believes may be more significant than previously.  I have recommended further titration of his isosorbide to 90 mg.  He will be seeing Dr. Dedra Skeens for pulmonary evaluation and he may require additional therapy for his lung disease.  His blood pressure today is controlled on his present dose of losartan at 50 mg, amlodipine 5 mg,  and low-dose Toprol-XL 25 mg.  I will see him in 6 months for reevaluation. Time spent: 25 minutes  Troy Sine, MD, [facility]  05/26/2015 6:41 PM

## 2015-05-27 ENCOUNTER — Other Ambulatory Visit: Payer: Self-pay

## 2015-05-27 MED ORDER — UMECLIDINIUM-VILANTEROL 62.5-25 MCG/INH IN AEPB: INHALATION_SPRAY | RESPIRATORY_TRACT | Status: DC

## 2015-05-27 NOTE — Telephone Encounter (Signed)
Spoke with pt's wife, aware of anoro refill to preferred pharmacy.  appt was made for pt with Duffy RhodyStanley before call was transferred to me.  Nothing further needed.

## 2015-06-17 NOTE — Addendum Note (Signed)
Addended by: Evans LanceSTOVER, Linnie Mcglocklin W on: 06/17/2015 07:43 AM   Modules accepted: Orders

## 2015-06-21 ENCOUNTER — Encounter: Payer: Self-pay | Admitting: Adult Health

## 2015-06-21 ENCOUNTER — Ambulatory Visit (INDEPENDENT_AMBULATORY_CARE_PROVIDER_SITE_OTHER): Payer: BLUE CROSS/BLUE SHIELD | Admitting: Adult Health

## 2015-06-21 VITALS — BP 138/68 | HR 53 | Temp 97.6°F | Ht 68.0 in | Wt 213.0 lb

## 2015-06-21 DIAGNOSIS — J449 Chronic obstructive pulmonary disease, unspecified: Secondary | ICD-10-CM | POA: Diagnosis not present

## 2015-06-21 DIAGNOSIS — R0600 Dyspnea, unspecified: Secondary | ICD-10-CM | POA: Diagnosis not present

## 2015-06-21 NOTE — Progress Notes (Signed)
Subjective:    Patient ID: Matthew Evans, male    DOB: 01-Sep-1950, 65 y.o.   MRN: [id number]  HPI 65 yo male with Mild COPD   TEST  PFT's 2014: FEV1 2.04 (68%), +15% increase with BD, no restriction, normal DLCO, +airtrapping.   06/21/2015 Follow up : COPD  Patient returns for six-month follow-up for COPD. He says overall he is doing about the same. He has daily shortness of breath with activity, especially if he walks up an incline or carries anything heavy. He says he is very active at home and in the yard.. Does try to walk at the track on occasion.. We discussed pulmonary rehabilitation but he declines.. Declines flu shot and pneumonia vaccine. Has occasional cough with white mucus, and intermittent wheezing. He denies any chest pain, hemoptysis, orthopnea, PND, or increased leg swelling.. Last chest x-ray in April 2016 with chronic changes. He remains on ANORO . Was previously on QVAR but did not feel it helped.  Spirometry today shows FEV1 76%, ratio 75, FVC 79%.   Past Medical History  Diagnosis Date  . Myocardial infarction ([facility]) 09/2007    PCI to RCA  . Carotid artery occlusion   . AAA (abdominal aortic aneurysm) ([facility])     mild; doppler 08/29/12-3.4x3.7  . Thyroid disease     hypothyroidism  . Emphysema   . Mini stroke ([facility])   . Lupus ([facility])     [name], [name]  . Memory loss     d/t mini stroke  . CAD (coronary artery disease)     echo 07/26/12-EF 55-60%; myoview 07/26/12-low normal wall motion with mild apical hypokinesis as well a septal wall motion consistent with LBBB  . HTN (hypertension)   . Hyperlipidemia   . GERD (gastroesophageal reflux disease)   . H/O hiatal hernia   . Headache(784.0)   . Arthritis   . Sleep apnea     sleep study 12/14/06 ahi 16.89, during rem 12.3, supine ahi 57.49; cpap 02/22/07-c-flex of 3 at 13cm h2o; PT STATES HE DOES NOT USE HIS CPAP EVERY NIGHT  . Stroke [facility])       PT STATES NEUROLOGIST TOLD HIM HE HAD HAD MINI STROKE  . Shortness of breath     WITH EXERTION  . Hypothyroidism     HX OF RADIOACTIVE IODINE   . History of palpitations     Current Outpatient Prescriptions on File Prior to Visit  Medication Sig Dispense Refill  . Albuterol Sulfate (PROAIR RESPICLICK) 108 (90 BASE) MCG/ACT AEPB Inhale 2 puffs into the lungs every 6 (six) hours as needed. (Patient taking differently: Inhale 2 puffs into the lungs every 6 (six) hours as needed (shortness of breath). ) 1 each 6  . amLODipine (NORVASC) 5 MG tablet TAKE 1 TABLET (5 MG TOTAL) BY MOUTH EVERY MORNING. 30 tablet 10  . aspirin EC 81 MG tablet Take 81 mg by mouth daily.      . clopidogrel (PLAVIX) 75 MG tablet TAKE 1 TABLET (75 MG TOTAL) BY MOUTH DAILY WITH BREAKFAST. 30 tablet 10  . cyclobenzaprine (FLEXERIL) 10 MG tablet Take 10 mg by mouth 2 (two) times daily as needed for muscle spasms.   0  . hydrochlorothiazide (MICROZIDE) 12.5 MG capsule TAKE 1 CAPSULE (12.5 MG TOTAL) BY MOUTH EVERY OTHER DAY. 30 capsule 2  . HYDROcodone-acetaminophen (NORCO/VICODIN) 5-325 MG per tablet Take 1 tablet by mouth 3 (three)  times daily as needed for moderate pain or severe pain.   0  . isosorbide mononitrate (IMDUR) 60 MG 24 hr tablet TAKE 1 &1/2 TABLETS (90 MG ) BY MOUTH AT BEDTIME. 45 tablet 10  . levothyroxine (SYNTHROID, LEVOTHROID) 175 MCG tablet Take 175 mcg by mouth daily before breakfast.    . losartan (COZAAR) 50 MG tablet TAKE 1 TABLET (50 MG TOTAL) BY MOUTH DAILY. 90 tablet 1  . metoprolol succinate (TOPROL XL) 25 MG 24 hr tablet Take 1 tablet (25 mg total) by mouth daily. 45 tablet 6  . Omega-3 Fatty Acids (FISH OIL) 1200 MG CAPS Take 2 capsules by mouth 2 (two) times daily.    Marland Kitchen. Umeclidinium-Vilanterol (ANORO ELLIPTA) 62.5-25 MCG/INH AEPB INHALE 1 PUFF INTO THE LUNGS DAILY. 60 each 3   No current facility-administered medications on file prior to visit.     Review of Systems Constitutional:   No   weight loss, night sweats,  Fevers, chills,  +fatigue, or  lassitude.  HEENT:   No headaches,  Difficulty swallowing,  Tooth/dental problems, or  Sore throat,                No sneezing, itching, ear ache, nasal congestion, post nasal drip,   CV:  No chest pain,  Orthopnea, PND, swelling in lower extremities, anasarca, dizziness, palpitations, syncope.   GI  No heartburn, indigestion, abdominal pain, nausea, vomiting, diarrhea, change in bowel habits, loss of appetite, bloody stools.   Resp:   No chest wall deformity  Skin: no rash or lesions.  GU: no dysuria, change in color of urine, no urgency or frequency.  No flank pain, no hematuria   MS:  No joint pain or swelling.  No decreased range of motion.  No back pain.  Psych:  No change in mood or affect. No depression or anxiety.  No memory loss.         Objective:   Physical Exam  Filed Vitals:   06/21/15 1127  BP: 138/68  Pulse: 53  Temp: 97.6 F (36.4 C)  TempSrc: Oral  Height: 5\' 8"  (1.727 m)  Weight: 213 lb (96.616 kg)  SpO2: 95%    Body mass index is 32.39 kg/(m^2).  GEN: A/Ox3; pleasant , NAD, elderly   HEENT:  [place]/AT,  EACs-clear, TMs-wnl, NOSE-clear, THROAT-clear, no lesions, no postnasal drip or exudate noted.   NECK:  Supple w/ fair ROM; no JVD; normal carotid impulses w/o bruits; no thyromegaly or nodules palpated; no lymphadenopathy.  RESP  Decreased BS in bases , no accessory muscle use, no dullness to percussion  CARD:  RRR, no m/r/g  , no peripheral edema, pulses intact, no cyanosis or clubbing.  GI:   Soft & nt; nml bowel sounds; no organomegaly or masses detected.  Musco: Warm bil, no deformities or joint swelling noted.   Neuro: alert, no focal deficits noted.    Skin: Warm, no lesions or rashes        Assessment & Plan:

## 2015-06-21 NOTE — Assessment & Plan Note (Signed)
Mild COPD improved control on ANORO  Spirometry with very minimal disease , no sign airflow obstruction   Plan  Continue on ANORO daily  Activity as tolerated .  follow up Dr. Vassie LollAlva  In 4-6 months and As needed

## 2015-06-21 NOTE — Patient Instructions (Signed)
Continue on ANORO daily  Activity as tolerated .  follow up Dr. Vassie LollAlva  In 4-6 months and As needed

## 2015-06-21 NOTE — Progress Notes (Signed)
Reviewed & agree with plan  

## 2015-07-16 ENCOUNTER — Other Ambulatory Visit: Payer: Self-pay | Admitting: Cardiovascular Disease

## 2015-07-16 NOTE — Telephone Encounter (Signed)
Rx(s) sent to pharmacy electronically.  

## 2015-08-10 DIAGNOSIS — Z79899 Other long term (current) drug therapy: Secondary | ICD-10-CM | POA: Diagnosis not present

## 2015-08-10 DIAGNOSIS — M3390 Dermatopolymyositis, unspecified, organ involvement unspecified: Secondary | ICD-10-CM | POA: Diagnosis not present

## 2015-08-10 DIAGNOSIS — L568 Other specified acute skin changes due to ultraviolet radiation: Secondary | ICD-10-CM | POA: Diagnosis not present

## 2015-08-14 ENCOUNTER — Other Ambulatory Visit: Payer: Self-pay | Admitting: Cardiovascular Disease

## 2015-08-16 NOTE — Telephone Encounter (Signed)
REFILL 

## 2015-11-02 DIAGNOSIS — M1990 Unspecified osteoarthritis, unspecified site: Secondary | ICD-10-CM | POA: Diagnosis not present

## 2015-11-02 DIAGNOSIS — M545 Low back pain: Secondary | ICD-10-CM | POA: Diagnosis not present

## 2015-11-02 DIAGNOSIS — E032 Hypothyroidism due to medicaments and other exogenous substances: Secondary | ICD-10-CM | POA: Diagnosis not present

## 2015-11-03 DIAGNOSIS — M79643 Pain in unspecified hand: Secondary | ICD-10-CM | POA: Diagnosis not present

## 2015-12-27 ENCOUNTER — Ambulatory Visit: Payer: BLUE CROSS/BLUE SHIELD | Admitting: Pulmonary Disease

## 2016-01-24 ENCOUNTER — Other Ambulatory Visit: Payer: Self-pay | Admitting: Cardiovascular Disease

## 2016-01-25 NOTE — Telephone Encounter (Signed)
Rx(s) sent to pharmacy electronically.  

## 2016-01-26 DIAGNOSIS — E039 Hypothyroidism, unspecified: Secondary | ICD-10-CM | POA: Diagnosis not present

## 2016-01-26 DIAGNOSIS — E789 Disorder of lipoprotein metabolism, unspecified: Secondary | ICD-10-CM | POA: Diagnosis not present

## 2016-02-02 DIAGNOSIS — E032 Hypothyroidism due to medicaments and other exogenous substances: Secondary | ICD-10-CM | POA: Diagnosis not present

## 2016-02-02 DIAGNOSIS — L309 Dermatitis, unspecified: Secondary | ICD-10-CM | POA: Diagnosis not present

## 2016-02-22 ENCOUNTER — Other Ambulatory Visit: Payer: Self-pay | Admitting: Adult Health

## 2016-02-23 ENCOUNTER — Ambulatory Visit: Payer: BLUE CROSS/BLUE SHIELD | Admitting: Pulmonary Disease

## 2016-03-06 DIAGNOSIS — M3313 Other dermatomyositis without myopathy: Secondary | ICD-10-CM | POA: Diagnosis not present

## 2016-03-06 DIAGNOSIS — M25511 Pain in right shoulder: Secondary | ICD-10-CM | POA: Diagnosis not present

## 2016-03-06 DIAGNOSIS — Z79899 Other long term (current) drug therapy: Secondary | ICD-10-CM | POA: Diagnosis not present

## 2016-03-06 DIAGNOSIS — M3390 Dermatopolymyositis, unspecified, organ involvement unspecified: Secondary | ICD-10-CM | POA: Diagnosis not present

## 2016-03-06 DIAGNOSIS — L568 Other specified acute skin changes due to ultraviolet radiation: Secondary | ICD-10-CM | POA: Diagnosis not present

## 2016-03-22 DIAGNOSIS — M7521 Bicipital tendinitis, right shoulder: Secondary | ICD-10-CM | POA: Diagnosis not present

## 2016-03-22 DIAGNOSIS — M7551 Bursitis of right shoulder: Secondary | ICD-10-CM | POA: Diagnosis not present

## 2016-03-22 DIAGNOSIS — M25511 Pain in right shoulder: Secondary | ICD-10-CM | POA: Diagnosis not present

## 2016-03-22 DIAGNOSIS — R202 Paresthesia of skin: Secondary | ICD-10-CM | POA: Diagnosis not present

## 2016-03-22 DIAGNOSIS — Z87891 Personal history of nicotine dependence: Secondary | ICD-10-CM | POA: Diagnosis not present

## 2016-03-22 DIAGNOSIS — Z888 Allergy status to other drugs, medicaments and biological substances status: Secondary | ICD-10-CM | POA: Diagnosis not present

## 2016-03-26 ENCOUNTER — Other Ambulatory Visit: Payer: Self-pay | Admitting: Cardiovascular Disease

## 2016-03-27 ENCOUNTER — Telehealth: Payer: Self-pay

## 2016-03-27 NOTE — Telephone Encounter (Signed)
Called patient home phone to ask how he take his hydrochlorothiazide 12.5 mg. His spouse Marlis Edelson(Lynn Debord) stated that he take it every other day. Sent in Rx as taking.

## 2016-04-25 DIAGNOSIS — E039 Hypothyroidism, unspecified: Secondary | ICD-10-CM | POA: Diagnosis not present

## 2016-05-03 ENCOUNTER — Other Ambulatory Visit: Payer: Self-pay | Admitting: Cardiovascular Disease

## 2016-05-04 DIAGNOSIS — M25519 Pain in unspecified shoulder: Secondary | ICD-10-CM | POA: Diagnosis not present

## 2016-05-04 DIAGNOSIS — I10 Essential (primary) hypertension: Secondary | ICD-10-CM | POA: Diagnosis not present

## 2016-05-04 DIAGNOSIS — E032 Hypothyroidism due to medicaments and other exogenous substances: Secondary | ICD-10-CM | POA: Diagnosis not present

## 2016-05-22 ENCOUNTER — Other Ambulatory Visit: Payer: Self-pay | Admitting: Cardiovascular Disease

## 2016-06-14 DIAGNOSIS — M7541 Impingement syndrome of right shoulder: Secondary | ICD-10-CM | POA: Diagnosis not present

## 2016-06-14 DIAGNOSIS — M25511 Pain in right shoulder: Secondary | ICD-10-CM | POA: Diagnosis not present

## 2016-06-14 DIAGNOSIS — M7581 Other shoulder lesions, right shoulder: Secondary | ICD-10-CM | POA: Diagnosis not present

## 2016-06-25 ENCOUNTER — Other Ambulatory Visit: Payer: Self-pay | Admitting: Cardiovascular Disease

## 2016-06-26 ENCOUNTER — Other Ambulatory Visit: Payer: Self-pay | Admitting: Rheumatology

## 2016-06-26 DIAGNOSIS — M25511 Pain in right shoulder: Secondary | ICD-10-CM

## 2016-06-26 NOTE — Telephone Encounter (Signed)
Rx(s) sent to pharmacy electronically.  

## 2016-07-03 ENCOUNTER — Ambulatory Visit
Admission: RE | Admit: 2016-07-03 | Discharge: 2016-07-03 | Disposition: A | Discharge status: Home / Self Care | Payer: BLUE CROSS/BLUE SHIELD | Source: Ambulatory Visit | Attending: Rheumatology | Admitting: Rheumatology

## 2016-07-03 DIAGNOSIS — M25511 Pain in right shoulder: Secondary | ICD-10-CM

## 2016-07-24 ENCOUNTER — Other Ambulatory Visit: Payer: Self-pay | Admitting: Cardiovascular Disease

## 2016-07-26 ENCOUNTER — Other Ambulatory Visit: Payer: Self-pay | Admitting: Cardiovascular Disease

## 2016-07-27 NOTE — Telephone Encounter (Signed)
Rx(s) sent to pharmacy electronically.  

## 2016-08-01 DIAGNOSIS — M25511 Pain in right shoulder: Secondary | ICD-10-CM | POA: Diagnosis not present

## 2016-08-01 DIAGNOSIS — G8929 Other chronic pain: Secondary | ICD-10-CM | POA: Diagnosis not present

## 2016-08-01 DIAGNOSIS — M7541 Impingement syndrome of right shoulder: Secondary | ICD-10-CM | POA: Diagnosis not present

## 2016-08-01 DIAGNOSIS — M19011 Primary osteoarthritis, right shoulder: Secondary | ICD-10-CM | POA: Diagnosis not present

## 2016-08-15 DIAGNOSIS — M25519 Pain in unspecified shoulder: Secondary | ICD-10-CM | POA: Diagnosis not present

## 2016-08-15 DIAGNOSIS — M549 Dorsalgia, unspecified: Secondary | ICD-10-CM | POA: Diagnosis not present

## 2016-08-16 ENCOUNTER — Telehealth: Payer: Self-pay | Admitting: *Deleted

## 2016-08-16 NOTE — Telephone Encounter (Signed)
Left message that appointment has been scheduled for him to see Dr Tresa Endokelly on 08/30/16 @ 3:30 to get surgical clearance for his shoulder. Last appointment was 05/26/15. At that time Dr Tresa Endokelly requested for him to come back in 6 months. If cannot make this appointment please call to schedule another day and time.

## 2016-08-22 ENCOUNTER — Other Ambulatory Visit: Payer: Self-pay | Admitting: Cardiovascular Disease

## 2016-08-22 NOTE — Telephone Encounter (Signed)
Rx(s) sent to pharmacy electronically.  

## 2016-08-23 ENCOUNTER — Other Ambulatory Visit: Payer: Self-pay | Admitting: Cardiovascular Disease

## 2016-08-29 ENCOUNTER — Encounter: Payer: Self-pay | Admitting: Adult Health

## 2016-08-29 ENCOUNTER — Ambulatory Visit (INDEPENDENT_AMBULATORY_CARE_PROVIDER_SITE_OTHER)
Admission: RE | Admit: 2016-08-29 | Discharge: 2016-08-29 | Disposition: A | Discharge status: Home / Self Care | Payer: BLUE CROSS/BLUE SHIELD | Source: Ambulatory Visit | Attending: Adult Health | Admitting: Adult Health

## 2016-08-29 ENCOUNTER — Ambulatory Visit (INDEPENDENT_AMBULATORY_CARE_PROVIDER_SITE_OTHER): Payer: BLUE CROSS/BLUE SHIELD | Admitting: Adult Health

## 2016-08-29 VITALS — BP 134/70 | HR 67 | Ht 68.0 in | Wt 210.0 lb

## 2016-08-29 DIAGNOSIS — Z87891 Personal history of nicotine dependence: Secondary | ICD-10-CM | POA: Diagnosis not present

## 2016-08-29 DIAGNOSIS — I2583 Coronary atherosclerosis due to lipid rich plaque: Secondary | ICD-10-CM

## 2016-08-29 DIAGNOSIS — J449 Chronic obstructive pulmonary disease, unspecified: Secondary | ICD-10-CM

## 2016-08-29 DIAGNOSIS — Z01818 Encounter for other preprocedural examination: Secondary | ICD-10-CM | POA: Insufficient documentation

## 2016-08-29 DIAGNOSIS — I2510 Atherosclerotic heart disease of native coronary artery without angina pectoris: Secondary | ICD-10-CM

## 2016-08-29 MED ORDER — UMECLIDINIUM-VILANTEROL 62.5-25 MCG/INH IN AEPB: INHALATION_SPRAY | RESPIRATORY_TRACT | 5 refills | Status: DC

## 2016-08-29 MED ORDER — UMECLIDINIUM-VILANTEROL 62.5-25 MCG/INH IN AEPB: 1.0000 | INHALATION_SPRAY | Freq: Every day | RESPIRATORY_TRACT | 0 refills | Status: AC

## 2016-08-29 NOTE — Patient Instructions (Signed)
Continue on ANORO daily , rinse after use Chest xray today .  Follow up with Dr. Vassie LollAlva  In 6 months and As needed   Good luck with upcoming shoulder surgery .

## 2016-08-29 NOTE — Progress Notes (Signed)
@Patient  ID: Matthew Evans, male    DOB: Aug 04, 1950, 66 y.o.   MRN: [id number]  Chief Complaint  Patient presents with  . Follow-up    COPD     Referring provider: Darci Needle, MD  HPI: 66 yo male with Mild COPD   TEST  PFT's 2014: FEV1 2.04 (68%), +15% increase with BD, no restriction, normal DLCO, +airtrapping.   08/29/2016 Follow up : COPD /Surgical clearance  Pt returns for 1 year follow up for COPD. Says he has been doing well.  Remains on ANORO . Is not on oxygen.  Remains active around house. Does outside yard work-weed eats, cuts grass riding.  No recent hospitlaization . No recent flare . No recent abx or steroids  Spirometry today shows similar lung function with FEV1 at 68%, ratio 73, FVC 69%.  Undergoing right rotator cuff surgery with Dr. Ranell Patrick  date to be determined. Goes for cardiac clearance tomorrow.   Declines flu shot and pneumonia vaccine.   Allergies  Allergen Reactions  . Niaspan [Niacin] Rash  . Septra [Sulfamethoxazole-Trimethoprim] Rash  . Simcor [Niacin-Simvastatin Er] Rash  . Zocor [Simvastatin] Rash    There is no immunization history for the selected administration types on file for this patient.  Past Medical History:  Diagnosis Date  . AAA (abdominal aortic aneurysm) (HCC)    mild; doppler 08/29/12-3.4x3.7  . Arthritis   . CAD (coronary artery disease)    echo 07/26/12-EF 55-60%; myoview 07/26/12-low normal wall motion with mild apical hypokinesis as well a septal wall motion consistent with LBBB  . Carotid artery occlusion   . Emphysema   . GERD (gastroesophageal reflux disease)   . H/O hiatal hernia   . Headache(784.0)   . History of palpitations   . HTN (hypertension)   . Hyperlipidemia   . Hypothyroidism    HX OF RADIOACTIVE IODINE   . Lupus    [name], [name]  . Memory loss    d/t mini stroke  . Mini stroke (HCC)   . Myocardial infarction 09/2007   PCI  to RCA  . Shortness of breath    WITH EXERTION  . Sleep apnea    sleep study 12/14/06 ahi 16.89, during rem 12.3, supine ahi 57.49; cpap 02/22/07-c-flex of 3 at 13cm h2o; PT STATES HE DOES NOT USE HIS CPAP EVERY NIGHT  . Stroke [facility])    PT STATES NEUROLOGIST TOLD HIM HE HAD HAD MINI STROKE  . Thyroid disease    hypothyroidism    Tobacco History: History  Smoking Status  . Former Smoker  . Packs/day: 2.00  . Years: 45.00  . Types: Cigarettes  . Quit date: 02/04/2010  Smokeless Tobacco  . Never Used   Counseling given: Not Answered   Outpatient Encounter Prescriptions as of 08/29/2016  Medication Sig  . Albuterol Sulfate (PROAIR RESPICLICK) 108 (90 BASE) MCG/ACT AEPB Inhale 2 puffs into the lungs every 6 (six) hours as needed. (Patient taking differently: Inhale 2 puffs into the lungs every 6 (six) hours as needed (shortness of breath). )  . amLODipine (NORVASC) 5 MG tablet TAKE 1 TABLET (5 MG TOTAL) BY MOUTH EVERY MORNING.  Ailene Ards ELLIPTA 62.5-25 MCG/INH AEPB INHALE 1 PUFF INTO THE LUNGS DAILY.  Marland Kitchen aspirin EC 81 MG tablet Take 81 mg by mouth daily.    . clopidogrel (PLAVIX) 75 MG tablet TAKE 1 TABLET (75 MG TOTAL) BY MOUTH DAILY  WITH BREAKFAST.  . cyclobenzaprine (FLEXERIL) 10 MG tablet Take 10 mg by mouth 2 (two) times daily as needed for muscle spasms.   . hydrochlorothiazide (MICROZIDE) 12.5 MG capsule Take 1 capsule (12.5 mg total) by mouth every other day.  Marland Kitchen. HYDROcodone-acetaminophen (NORCO/VICODIN) 5-325 MG per tablet Take 1 tablet by mouth 3 (three) times daily as needed for moderate pain or severe pain.   . isosorbide mononitrate (IMDUR) 60 MG 24 hr tablet TAKE 1.5 TABLETS BY MOUTH DAILY  . levothyroxine (SYNTHROID, LEVOTHROID) 175 MCG tablet Take 175 mcg by mouth daily before breakfast.  . losartan (COZAAR) 50 MG tablet TAKE 1 TABLET BY MOUTH DAILY. **CONTACT DR OFFICE FOR REFILLS**  . metoprolol succinate (TOPROL-XL) 25 MG 24 hr tablet TAKE 1 TABLET DAILY FOR 1 WEEK THEN  INCREASE TO 1.5 TABLETS DAILY.  Marland Kitchen. Omega-3 Fatty Acids (FISH OIL) 1200 MG CAPS Take 2 capsules by mouth 2 (two) times daily.   No facility-administered encounter medications on file as of 08/29/2016.      Review of Systems  Constitutional:   No  weight loss, night sweats,  Fevers, chills, fatigue, or  lassitude.  HEENT:   No headaches,  Difficulty swallowing,  Tooth/dental problems, or  Sore throat,                No sneezing, itching, ear ache, nasal congestion, post nasal drip,   CV:  No chest pain,  Orthopnea, PND, swelling in lower extremities, anasarca, dizziness, palpitations, syncope.   GI  No heartburn, indigestion, abdominal pain, nausea, vomiting, diarrhea, change in bowel habits, loss of appetite, bloody stools.   Resp: No shortness of breath with exertion or at rest.  No excess mucus, no productive cough,  No non-productive cough,  No coughing up of blood.  No change in color of mucus.  No wheezing.  No chest wall deformity  Skin: no rash or lesions.  GU: no dysuria, change in color of urine, no urgency or frequency.  No flank pain, no hematuria       Physical Exam  BP 134/70 (BP Location: Right Arm, Cuff Size: Normal)   Pulse 67   Ht 5\' 8"  (1.727 m)   Wt 210 lb (95.3 kg)   SpO2 96%   BMI 31.93 kg/m   GEN: A/Ox3; pleasant , NAD    HEENT:  [place]/AT,  EACs-clear, TMs-wnl, NOSE-clear, THROAT-clear, no lesions, no postnasal drip or exudate noted.   NECK:  Supple w/ fair ROM; no JVD; normal carotid impulses w/o bruits; no thyromegaly or nodules palpated; no lymphadenopathy.    RESP  Clear  P & A; w/o, wheezes/ rales/ or rhonchi. no accessory muscle use, no dullness to percussion  CARD:  RRR, no m/r/g, no peripheral edema, pulses intact, no cyanosis or clubbing.  GI:   Soft & nt; nml bowel sounds; no organomegaly or masses detected.   Musco: Warm bil, no deformities or joint swelling noted.   Neuro: alert, no focal deficits noted.    Skin: Warm, no lesions or  rashes    Lab Results:  CBC   BNP  Imaging: No results found.   Assessment & Plan:   COPD (chronic obstructive pulmonary disease) (HCC) Compensated on present regimen  Spirometry shows no significant change in lung function since 2014.  He is active and appears well controlled. And is not on Oxygen Explained he does have a mild to moderate risk due to underlying COPD -  Major Pulmonary risks identified in the multifactorial risk  analysis are but not limited to a) pneumonia; b) recurrent intubation risk; c) prolonged or recurrent acute respiratory failure needing mechanical ventilation; d) prolonged hospitalization; e) DVT/Pulmonary embolism; f) Acute Pulmonary edema  Recommend 1. Short duration of surgery as much as possible and avoid paralytic if possible 3. DVT prophylaxis if indicated.  4. Aggressive pulmonary toilet with o2, bronchodilatation, and incentive spirometry and early ambulation  Check cxr today .   Plan Patient Instructions  Continue on ANORO daily , rinse after use Chest xray today .  Follow up with Dr. Vassie Loll  In 6 months and As needed   Good luck with upcoming shoulder surgery .       Preoperative clearance Pulmonary Preop clearance exam  Pt has underlying Moderate COPD/Emphysema (GOLD II) and is well controlled on ANORO  He is Compensated on present regimen  Spirometry shows no significant change in lung function since 2014.  He is active and appears well controlled. And is not on Oxygen Explained he does have a mild to moderate risk due to underlying COPD -  Major Pulmonary risks identified in the multifactorial risk analysis are but not limited to a) pneumonia; b) recurrent intubation risk; c) prolonged or recurrent acute respiratory failure needing mechanical ventilation; d) prolonged hospitalization; e) DVT/Pulmonary embolism; f) Acute Pulmonary edema  Recommend 1. Short duration of surgery as much as possible and avoid paralytic if  possible 3. DVT prophylaxis if indicated.  4. Aggressive pulmonary toilet with o2, bronchodilatation, and incentive spirometry and early ambulation  Check cxr today .   Plan Patient Instructions  Continue on ANORO daily , rinse after use Chest xray today .  Follow up with Dr. Vassie Loll  In 6 months and As needed   Good luck with upcoming shoulder surgery .          Rubye Oaks, NP 08/29/2016

## 2016-08-29 NOTE — Assessment & Plan Note (Signed)
Compensated on present regimen  Spirometry shows no significant change in lung function since 2014.  He is active and appears well controlled. And is not on Oxygen Explained he does have a mild to moderate risk due to underlying COPD -  Major Pulmonary risks identified in the multifactorial risk analysis are but not limited to a) pneumonia; b) recurrent intubation risk; c) prolonged or recurrent acute respiratory failure needing mechanical ventilation; d) prolonged hospitalization; e) DVT/Pulmonary embolism; f) Acute Pulmonary edema  Recommend 1. Short duration of surgery as much as possible and avoid paralytic if possible 3. DVT prophylaxis if indicated.  4. Aggressive pulmonary toilet with o2, bronchodilatation, and incentive spirometry and early ambulation  Check cxr today .   Plan Patient Instructions  Continue on ANORO daily , rinse after use Chest xray today .  Follow up with Matthew Evans  In 6 months and As needed   Good luck with upcoming shoulder surgery .

## 2016-08-29 NOTE — Assessment & Plan Note (Signed)
Pulmonary Preop clearance exam  Pt has underlying Moderate COPD/Emphysema (GOLD II) and is well controlled on ANORO  He is Compensated on present regimen  Spirometry shows no significant change in lung function since 2014.  He is active and appears well controlled. And is not on Oxygen Explained he does have a mild to moderate risk due to underlying COPD -  Major Pulmonary risks identified in the multifactorial risk analysis are but not limited to a) pneumonia; b) recurrent intubation risk; c) prolonged or recurrent acute respiratory failure needing mechanical ventilation; d) prolonged hospitalization; e) DVT/Pulmonary embolism; f) Acute Pulmonary edema  Recommend 1. Short duration of surgery as much as possible and avoid paralytic if possible 3. DVT prophylaxis if indicated.  4. Aggressive pulmonary toilet with o2, bronchodilatation, and incentive spirometry and early ambulation  Check cxr today .   Plan Patient Instructions  Continue on ANORO daily , rinse after use Chest xray today .  Follow up with Matthew Evans  In 6 months and As needed   Good luck with upcoming shoulder surgery .

## 2016-08-30 ENCOUNTER — Encounter: Payer: Self-pay | Admitting: Cardiovascular Disease

## 2016-08-30 ENCOUNTER — Ambulatory Visit (INDEPENDENT_AMBULATORY_CARE_PROVIDER_SITE_OTHER): Payer: BLUE CROSS/BLUE SHIELD | Admitting: Cardiovascular Disease

## 2016-08-30 VITALS — BP 130/76 | HR 68 | Ht 68.0 in | Wt 209.6 lb

## 2016-08-30 DIAGNOSIS — Z79899 Other long term (current) drug therapy: Secondary | ICD-10-CM | POA: Diagnosis not present

## 2016-08-30 DIAGNOSIS — I119 Hypertensive heart disease without heart failure: Secondary | ICD-10-CM | POA: Diagnosis not present

## 2016-08-30 DIAGNOSIS — I714 Abdominal aortic aneurysm, without rupture, unspecified: Secondary | ICD-10-CM

## 2016-08-30 DIAGNOSIS — Z01818 Encounter for other preprocedural examination: Secondary | ICD-10-CM

## 2016-08-30 DIAGNOSIS — I2510 Atherosclerotic heart disease of native coronary artery without angina pectoris: Secondary | ICD-10-CM | POA: Diagnosis not present

## 2016-08-30 DIAGNOSIS — E785 Hyperlipidemia, unspecified: Secondary | ICD-10-CM

## 2016-08-30 DIAGNOSIS — I447 Left bundle-branch block, unspecified: Secondary | ICD-10-CM

## 2016-08-30 DIAGNOSIS — E039 Hypothyroidism, unspecified: Secondary | ICD-10-CM | POA: Diagnosis not present

## 2016-08-30 DIAGNOSIS — I2583 Coronary atherosclerosis due to lipid rich plaque: Secondary | ICD-10-CM | POA: Diagnosis not present

## 2016-08-30 NOTE — Progress Notes (Signed)
Patient ID: Matthew Evans, male   DOB: 04-10-1951, 66 y.o.   MRN: [id number]      HPI: Matthew Evans, is a 66 y.o. male who presents to the office today for a 17-monthcardiology evaluation and preoperative clearance prior to undergoing shoulder surgery by Dr. NAlma Friendly   [name] established CAD and suffered an ST segment elevation inferior MI and underwent RCA stenting in April 2009. He underwent staged cutting balloon to a high grade LAD stenosis which involved multiple branches and stenting was not done to reduce potential for jailing of his multiple branches. In August 2010 he was found to have 50-60% stenosis beyond his RCA stent in the LAD intervention site remained patent. He has a documented abdominal aortic aneurysm and a followup evaluation with Doppler imaging  on 08/29/2012 was essentially unchanged from one year previously and showed a measurement of 3.4x3.7 cm.  Additional problems include COPD, hypertension, hypothyroidism, hyperlipidemia, GERD. A nuclear study in February 2014 remained low risk and demonstrated mild apical hypokinesis with low normal wall motion without ischemia. An echo Doppler study in February 2014 showed mild LVH with grade 1 diastolic dysfunction and normal systolic function. He did have aortic valve thickening without stenosis and mild left atrial dilatation. Had normal pulmonary pressures.  He has a history of palpitations which has been treated with low dose  beta blocker therapy.He has COPD. He recently has been on Anora in addition to when necessary albuterol.  He underwent a Nissen fundoplication hiatal hernia surgery by Dr. RRosendo Grosin May, 2015.  Since that time, he has lost approximately 20 pounds.  In 2015 A follow-up abdominal aortic ultrasound his revealed 3.6 x 3.3 cm with a small amount of atherosclerosis visualized, which was not hemodynamically significant.  This was not significantly changed from one year previously.  He had an emergency room  evaluation on 09/15/2014.  He had been working aggressively at home in his yard intensely and overdid it.  He presented with presyncope and also had mild dyspnea.  He had noticed some mild dyspnea on exertion for the past several months.  In the emergency room he was noted to have an irregular heart rate but sinus rhythm with frequent PVCs.  PVCs were not conducted so his resting pulse radially was bradycardic.    On 09/30/2014 an echo Doppler showed an EF of 40-45% with diffuse hypokinesis and grade 2 diastolic dysfunction.  There was mitral annular calcification.  His left atrium was mildly dilated.  Right ventricle was normal.  A nuclear perfusion study done the same day was interpreted as low risk.  He has left bundle branch block.  There was a small mild fixed anterior defect consistent with soft tissue attenuation versus possible left bundle branch block abnormality.  There was no ischemia.  A follow-up abdominal ultrasound in June 2016 showed an aortic aneurysm of 3.7 x 3.5, which was not significantly changed.  At times, he has noticed rare episodes of chest pain but typically most of his symptoms are related to shortness of breath with activity.  He was supposed to be taking isosorbide 90 mg daily, but has only been taking 60 mg.  He now sees Dr.[name] for his pulmonary care.  Mr. BCalixtohas been followed for primary care by Dr. [name]  He was evaluated by Dr. NLaurina Bustleand is in need for right shoulder surgery to be done under general anesthesia.  He has been evaluated by pulmonary for preoperative clearance and presents today  for cardiology clearance.  Eyes any chest tightness or pressure.  He admits to shortness of breath particularly with a pill walking.  He is unaware of any significant palpitations.  He denies presyncope or syncope.  He presents for evaluation.  Past Medical History:  Diagnosis Date  . AAA (abdominal aortic aneurysm) (HCC)    mild; doppler 08/29/12-3.4x3.7  . Arthritis   . CAD  (coronary artery disease)    echo 07/26/12-EF 55-60%; myoview 07/26/12-low normal wall motion with mild apical hypokinesis as well a septal wall motion consistent with LBBB  . Carotid artery occlusion   . Emphysema   . GERD (gastroesophageal reflux disease)   . H/O hiatal hernia   . Headache(784.0)   . History of palpitations   . HTN (hypertension)   . Hyperlipidemia   . Hypothyroidism    HX OF RADIOACTIVE IODINE   . Lupus    [name], [name]  . Memory loss    d/t mini stroke  . Mini stroke ([place])   . Myocardial infarction 09/2007   PCI to RCA  . Shortness of breath    WITH EXERTION  . Sleep apnea    sleep study 12/14/06 ahi 16.89, during rem 12.3, supine ahi 57.49; cpap 02/22/07-c-flex of 3 at 13cm h2o; PT STATES HE DOES NOT USE HIS CPAP EVERY NIGHT  . Stroke [name])    PT STATES NEUROLOGIST TOLD HIM HE HAD HAD MINI STROKE  . Thyroid disease    hypothyroidism    Past Surgical History:  Procedure Laterality Date  . balloon atherotomy     LAD  . CARDIAC CATHETERIZATION  08/11/11   EF50-55%, small AAA, no signifiant stent restenosis in RCA , no restenosis of PTCA of LAD, medical therapy  . CARDIAC CATHETERIZATION  09/27/06   cutting balloon arthrotomy of LAD stenosis with dilatation of a 3.5x1m cutting balloon  . CARDIAC CATHETERIZATION  01/07/09   50-60% progressive dz beyond the stented segment in the RCA , LAD intervention remained patent  . CORONARY ANGIOPLASTY WITH STENT PLACEMENT  09/24/06   PTCA/stent of RCA with a 3.5x247mTaxus DES post dilated to 3.9631m. CORONARY STENT PLACEMENT  09/2007   right  . ESOPHAGEAL MANOMETRY N/A 08/04/2013   Procedure: ESOPHAGEAL MANOMETRY (EM);  Surgeon: PatBeryle BeamsD;  Location: [facility];  Service: Endoscopy;  Laterality: N/A;  . LEFT HEART CATHETERIZATION WITH CORONARY ANGIOGRAM N/A 08/11/2011   Procedure: LEFT HEART CATHETERIZATION WITH CORONARY ANGIOGRAM;  Surgeon:  ThoTroy SineD;  [facility];  Service: Cardiovascular;  Laterality: N/A;    Allergies  Allergen Reactions  . Niaspan [Niacin] Rash  . Septra [Sulfamethoxazole-Trimethoprim] Rash  . Simcor [Niacin-Simvastatin Er] Rash  . Zocor [Simvastatin] Rash    Current Outpatient Prescriptions  Medication Sig Dispense Refill  . amLODipine (NORVASC) 5 MG tablet TAKE 1 TABLET (5 MG TOTAL) BY MOUTH EVERY MORNING. 30 tablet 0  . aspirin EC 81 MG tablet Take 81 mg by mouth daily.      . clopidogrel (PLAVIX) 75 MG tablet TAKE 1 TABLET (75 MG TOTAL) BY MOUTH DAILY WITH BREAKFAST. 30 tablet 0  . cyclobenzaprine (FLEXERIL) 10 MG tablet Take 10 mg by mouth 2 (two) times daily as needed for muscle spasms.   0  . hydrochlorothiazide (MICROZIDE) 12.5 MG capsule Take 1 capsule (12.5 mg total) by mouth every other day. 30 capsule 2  . HYDROcodone-acetaminophen (  NORCO/VICODIN) 5-325 MG per tablet Take 1 tablet by mouth 3 (three) times daily as needed for moderate pain or severe pain.   0  . isosorbide mononitrate (IMDUR) 60 MG 24 hr tablet TAKE 1.5 TABLETS BY MOUTH DAILY 45 tablet 0  . levothyroxine (SYNTHROID, LEVOTHROID) 175 MCG tablet Take 175 mcg by mouth daily before breakfast.    . losartan (COZAAR) 50 MG tablet TAKE 1 TABLET BY MOUTH DAILY. **CONTACT DR OFFICE FOR REFILLS** 7 tablet 0  . metoprolol succinate (TOPROL-XL) 25 MG 24 hr tablet TAKE 1 TABLET DAILY FOR 1 WEEK THEN INCREASE TO 1.5 TABLETS DAILY. 45 tablet 5  . Omega-3 Fatty Acids (FISH OIL) 1200 MG CAPS Take 2 capsules by mouth 2 (two) times daily.    Marland Kitchen umeclidinium-vilanterol (ANORO ELLIPTA) 62.5-25 MCG/INH AEPB Inhale 1 puff into the lungs daily. 1 each 0  . umeclidinium-vilanterol (ANORO ELLIPTA) 62.5-25 MCG/INH AEPB INHALE 1 PUFF INTO THE LUNGS DAILY. 60 each 5   No current facility-administered medications for this visit.     Socially he is married has 3 children. There is a remote tobacco history. He has tried to limit his sodium  intake.  His wife also is my patient was diagnosed with recent breast cancer.  ROS General: Negative; No fevers, chills, or night sweats;  HEENT: Negative; No changes in vision or hearing, sinus congestion, difficulty swallowing Pulmonary: Positive for COPD, with occasional wheezing;  mild shortness of breath with uphill walking Cardiovascular: See history of present illness GI: Hiatal hernia symptoms has resolved  since his Nissen fundoplication; No nausea, vomiting, diarrhea, or abdominal pain GU: Negative; No dysuria, hematuria, or difficulty voiding Musculoskeletal: Negative; no myalgias, joint pain, or weakness Hematologic/Oncology: Negative; no easy bruising, bleeding Endocrine: Negative; no heat/cold intolerance; no diabetes Neuro: Negative; no changes in balance, headaches; occasional numbness in his hands. Skin: Negative; No rashes or skin lesions Psychiatric: Negative; No behavioral problems, depression Sleep: Negative; No snoring, daytime sleepiness, hypersomnolence, bruxism, restless legs, hypnogognic hallucinations, no cataplexy Other comprehensive 14 point system review is negative.   PE BP 130/76   Pulse 68   Ht '5\' 8"'$  (1.727 m)   Wt 209 lb 9.6 oz (95.1 kg)   BMI 31.87 kg/m    Repeat blood pressure 124/70 Wt Readings from Last 3 Encounters:  08/30/16 209 lb 9.6 oz (95.1 kg)  08/29/16 210 lb (95.3 kg)  06/21/15 213 lb (96.6 kg)   General: Alert, oriented, no distress HEENT: Normocephalic, atraumatic. Pupils round and reactive; sclera anicteric;  Nose without nasal septal hypertrophy Mouth/Parynx benign; Mallinpatti scale 2/3 Neck: No JVD, no carotid bruits .  Normal carotid upstroke Lungs: decreased breath sounds; no wheezing or rales Heart: RRR, s1 s2 normal 1/6 sem; no S3 or S4 gallop; no diastolic murmur; no rubs thrills or heaves Abdomen: soft, nontender; no hepatosplenomehaly, BS+; abdominal aorta nontender and not dilated by palpation.  Back: No CVA  tenderness  Moderate central adiposity Pulses 2+ Extremities: no clubbinbg cyanosis or edema, Homan's sign negative  Neurologic: grossly nonfocal Psychological: Normal affect and mood  ECG (independently read by me):Normal sinus rhythm at 68 bpm.  Left bundle branch block with repolarization changes.  Her interval 166 ms; QTc interval 467 ms.  December 2016 ECG (independently read by me): Sinus bradycardia 58 bpm.  Left bundle branch block.  PR interval 148 ms.    June 2016 ECG (independently read by me): Sinus bradycardia 52 bpm.  Left bundle branch block with repolarization changes.  April 2016  ECG (independently read by me): Sinus rhythm at 62 bpm.  PVC.  QTc interval 475 ms.  September 2015 ECG (independently read by me): Sinus bradycardia at 46 beats per minute.  Left bundle branch block with repolarization changes  05/13/2013 ECG: Sinus bradycardia 50 beats per minute. beats per minute with mild left axis deviation and left bundle branch block. There is complete resolution of prior ectopy.   LABS  BMP Latest Ref Rng & Units 09/14/2014 11/03/2013 10/02/2013  Glucose 70 - 99 mg/dL 90 101(H) 87  BUN 6 - 23 mg/dL _0 Creatinine 0.50 - 1.35 mg/dL 1.39(H) 1.4 1.26  Sodium 135 - 145 mmol/L 140 142 140  Potassium 3.5 - 5.1 mmol/L 4.6 4.9 4.2  Chloride 96 - 112 mmol/L 105 106 99  CO2 19 - 32 mmol/L _1 Calcium 8.4 - 10.5 mg/dL 9.1 9.5 9.4     Hepatic Function Latest Ref Rng & Units 10/29/2012 10/29/2012 03/31/2011  Total Protein 6.0 - 8.3 g/dL 6.0 6.3 7.4  Albumin 3.5 - 5.2 g/dL 3.8 3.8 3.6  AST 0 - 37 U/L _2 ALT 0 - 53 U/L _3 Alk Phosphatase 39 - 117 U/L 79 81 97  Total Bilirubin 0.3 - 1.2 mg/dL 0.5 0.5 0.8    CBC Latest Ref Rng & Units 09/14/2014 10/02/2013 10/29/2012  WBC 4.0 - 10.5 K/uL 9.9 7.2 5.2  Hemoglobin 13.0 - 17.0 g/dL 15.5 14.8 13.8  Hematocrit 39.0 - 52.0 % 46.6 44.5 40.8  Platelets 150 - 400 K/uL 235 254 255   Lab Results  Component Value  Date   TSH 5.349 (H) 10/29/2012    BNP    Component Value Date/Time   PROBNP 20.0 11/03/2013 1737    Lipid Panel     Component Value Date/Time   CHOL 114 10/29/2012 1634   TRIG 73 10/29/2012 1634   HDL 36 (L) 10/29/2012 1634   CHOLHDL 3.2 10/29/2012 1634   VLDL 15 10/29/2012 1634   LDLCALC 63 10/29/2012 1634     RADIOLOGY: No results found.  IMPRESSION:  1. Coronary artery disease due to lipid rich plaque   2. Hypertensive heart disease without heart failure   3. Left bundle branch block   4. Hypothyroidism, unspecified type   5. Hyperlipidemia with target LDL less than 70   6. Preoperative clearance   7. Medication management   8. Abdominal aortic aneurysm (AAA) without rupture [facility])     ASSESSMENT AND PLAN: Mr. Karsyn Rochin is a 66 year old white male who is 9 years status post suffering an  inferior ST segment elevation myocardial infarction for which he underwent successful stenting of his RCA and staged cutting balloon to high grade LAD stenosis.  2016, a  nuclear perfusion study ws low risk and showed probable attenuation artifact versus possible left bundle branch block attenuation.  Importantly, there was no evidence for ischemia.  Ejection fraction was 45%, which corroborates with his EF estimate on his echo Doppler study.  His echo Doppler study at that time showed an EF of 40-45% with diffuse hypokinesis and grade 2 diastolic dysfunction, mild annular calcification and mild dilation of his left atrium.  He has not experienced any anginal type symptomatology, but has experience shortness of breath particularly with a pill walking.  I am recommending a preoperative echo Doppler study be obtained for evaluation of systolic and diastolic function.  Review his medications indicates that he has been taking isosorbide 90  mg in addition to amlodipine 5 mg, losartan 50 mg, and cell 25 mg for CAD and hypertension.  He continues to be on dual platelet therapy with aspirin and  Plavix.  If surgery is to be undertaken, he would need to hold his Plavix for at least 5 days.  On exam today he has decreased breath sounds but no wheezing.  He is on levothyroxine for hypothyroidism.  I have recommended that laboratory be obtained in the fasting state.  Presently, he does not appear to be on any lipid-lowering therapy, and remotely had allergies to simvastatin.  We will try to get the echo Doppler study within the next week and clearance will be given for his operative surgery.  He will require a future abdominal aortic ultrasound to reassess his abdominal aortic aneurysm, but this can be deferred until after his shoulder surgery.  I will see him in several months for cardiology reevaluation.  Troy Sine, MD, [facility]  08/30/2016 5:38 PM

## 2016-08-30 NOTE — Patient Instructions (Addendum)
Your physician has requested that you have an echocardiogram NEXT WEEK. Echocardiography is a painless test that uses sound waves to create images of your heart. It provides your doctor with information about the size and shape of your heart and how well your heart's chambers and valves are working. This procedure takes approximately one hour. There are no restrictions for this procedure.   Your physician has requested that you have an abdominal aorta duplex. During this test, an ultrasound is used to evaluate the aorta. Allow 30 minutes for this exam. Do not eat after midnight the day before and avoid carbonated beverages.   Your physician recommends that you return for lab work FASTING.  Your physician recommends that you schedule a follow-up appointment in: 3 months.

## 2016-08-31 ENCOUNTER — Telehealth: Payer: Self-pay | Admitting: *Deleted

## 2016-08-31 NOTE — Telephone Encounter (Signed)
Left message regarding Echocardiogram appointment scheduled for Friday 09/01/16 @ 3:00 pm at [facility]--arrival time is 2:30 pm-- 1st floor admissions office.  Requested call back to confirm.

## 2016-08-31 NOTE — Telephone Encounter (Signed)
Spoke with patient @ 10:00 am regarding appointment date, time and location of Echo--also told patient I would mail follow up appointment information (Dr. Tresa EndoKelly) to him.  He voiced his understanding

## 2016-09-01 ENCOUNTER — Ambulatory Visit (HOSPITAL_COMMUNITY)
Admission: RE | Admit: 2016-09-01 | Discharge: 2016-09-01 | Disposition: A | Discharge status: Home / Self Care | Payer: BLUE CROSS/BLUE SHIELD | Source: Ambulatory Visit | Attending: Cardiovascular Disease | Admitting: Cardiovascular Disease

## 2016-09-01 DIAGNOSIS — I119 Hypertensive heart disease without heart failure: Secondary | ICD-10-CM | POA: Diagnosis not present

## 2016-09-01 DIAGNOSIS — I2583 Coronary atherosclerosis due to lipid rich plaque: Secondary | ICD-10-CM | POA: Diagnosis not present

## 2016-09-01 DIAGNOSIS — E039 Hypothyroidism, unspecified: Secondary | ICD-10-CM | POA: Diagnosis not present

## 2016-09-01 DIAGNOSIS — Z79899 Other long term (current) drug therapy: Secondary | ICD-10-CM | POA: Diagnosis not present

## 2016-09-01 DIAGNOSIS — I447 Left bundle-branch block, unspecified: Secondary | ICD-10-CM | POA: Diagnosis not present

## 2016-09-01 DIAGNOSIS — I2510 Atherosclerotic heart disease of native coronary artery without angina pectoris: Secondary | ICD-10-CM

## 2016-09-01 DIAGNOSIS — E785 Hyperlipidemia, unspecified: Secondary | ICD-10-CM | POA: Diagnosis not present

## 2016-09-01 LAB — LIPID PANEL
CHOL/HDL RATIO: 3.7 ratio (ref <5.0)
CHOLESTEROL: 117 mg/dL (ref <200)
HDL: 32 mg/dL — AB (ref >40)
LDL Cholesterol: 66 mg/dL (ref <100)
Triglycerides: 94 mg/dL (ref <150)
VLDL: 19 mg/dL (ref <30)

## 2016-09-01 LAB — ECHOCARDIOGRAM COMPLETE
AVLVOTPG: 4 mmHg
CHL CUP STROKE VOLUME: 47 mL
E decel time: 215 msec
E/e' ratio: 10.87
FS: 25 % — AB (ref 28–44)
IV/PV OW: 0.84
LA diam end sys: 37 mm
LA vol: 41.8 mL
LADIAMINDEX: 1.78 cm/m2
LASIZE: 37 mm
LAVOLA4C: 40.2 mL
LAVOLIN: 20.1 mL/m2
LDCA: 3.8 cm2
LV E/e' medial: 10.87
LV E/e'average: 10.87
LV dias vol: 109 mL (ref 62–150)
LV e' LATERAL: 8.05 cm/s
LVDIAVOLIN: 52 mL/m2
LVOT SV: 77 mL
LVOT VTI: 20.3 cm
LVOTD: 22 mm
LVOTPV: 95.1 cm/s
LVSYSVOL: 62 mL — AB (ref 21–61)
LVSYSVOLIN: 30 mL/m2
MV Dec: 215
MV Peak grad: 3 mmHg
MV pk A vel: 135 m/s
MV pk E vel: 87.5 m/s
PW: 12.2 mm — AB (ref 0.6–1.1)
RV LATERAL S' VELOCITY: 14.4 cm/s
RV TAPSE: 24.4 mm
Simpson's disk: 44
TDI e' lateral: 8.05
TDI e' medial: 5.11

## 2016-09-01 LAB — COMPREHENSIVE METABOLIC PANEL
ALBUMIN: 3.7 g/dL (ref 3.6–5.1)
ALK PHOS: 102 U/L (ref 40–115)
ALT: 12 U/L (ref 9–46)
AST: 14 U/L (ref 10–35)
BUN: 20 mg/dL (ref 7–25)
CHLORIDE: 106 mmol/L (ref 98–110)
CO2: 28 mmol/L (ref 20–31)
CREATININE: 1.18 mg/dL (ref 0.70–1.25)
Calcium: 8.9 mg/dL (ref 8.6–10.3)
GLUCOSE: 92 mg/dL (ref 65–99)
Potassium: 4.7 mmol/L (ref 3.5–5.3)
SODIUM: 142 mmol/L (ref 135–146)
Total Bilirubin: 0.9 mg/dL (ref 0.2–1.2)
Total Protein: 6.2 g/dL (ref 6.1–8.1)

## 2016-09-01 LAB — CBC
HCT: 45.3 % (ref 38.5–50.0)
Hemoglobin: 15.1 g/dL (ref 13.2–17.1)
MCH: 28 pg (ref 27.0–33.0)
MCHC: 33.3 g/dL (ref 32.0–36.0)
MCV: 84 fL (ref 80.0–100.0)
MPV: 9.6 fL (ref 7.5–12.5)
Platelets: 279 10*3/uL (ref 140–400)
RBC: 5.39 MIL/uL (ref 4.20–5.80)
RDW: 13.1 % (ref 11.0–15.0)
WBC: 6.2 10*3/uL (ref 3.8–10.8)

## 2016-09-01 LAB — TSH

## 2016-09-01 NOTE — Progress Notes (Signed)
  Echocardiogram 2D Echocardiogram has been performed.  Delcie Roch 09/01/2016, 3:40 PM

## 2016-09-05 ENCOUNTER — Telehealth: Payer: Self-pay | Admitting: Cardiovascular Disease

## 2016-09-05 NOTE — Telephone Encounter (Signed)
Contacted patient. Notified him that results are waiting MD review and he will be notified as soon as they are available. He voiced understanding.

## 2016-09-05 NOTE — Telephone Encounter (Signed)
New message ° ° ° ° ° °Calling to get echo results °

## 2016-09-05 NOTE — Telephone Encounter (Signed)
New message     Patient had echo done at [facility]  - calling for test results.

## 2016-09-05 NOTE — Telephone Encounter (Signed)
Returned call to patient's wife. Informed her that the results have not been provided to clinical staff member in order to provide them and that this was told to patient earlier today. They are waiting on results for surgical clearance. They are aware that this message was previously sent to [facility], CMA to follow up on .

## 2016-09-07 ENCOUNTER — Telehealth: Payer: Self-pay | Admitting: *Deleted

## 2016-09-07 ENCOUNTER — Telehealth: Payer: Self-pay | Admitting: Cardiovascular Disease

## 2016-09-07 NOTE — Telephone Encounter (Signed)
Wife is calling back again today,said they still have not received the results from his Echo test. He needs this asap so he can have surgery on his shoulder.

## 2016-09-07 NOTE — Telephone Encounter (Signed)
Patient needing clearance for right shoulder scope, A-SAD, possible mini-open RCR, possible bi tendodesis, open DCR, SA-SAD-SCOPE w partial acromioplasty w/wo coracoacromial release, SA-debridement extensive, BICEPS TENODESIS-open, open resection distal clavical. Echo results with okay for surgery faxed to the number provided.

## 2016-09-07 NOTE — Progress Notes (Signed)
Reviewed & agree with plan  

## 2016-09-07 NOTE — Telephone Encounter (Signed)
Spoke with pt, aware echo has been read and he is clear for surgery. Echo results with clearance will be faxed to [facility].

## 2016-09-07 NOTE — Telephone Encounter (Signed)
Spoke with pt, aware dr Tresa Endo is in the cath lab all day today. I have sent him a message asking him to look at echo for clearance. Patient voiced understanding

## 2016-09-13 ENCOUNTER — Telehealth: Payer: Self-pay | Admitting: Adult Health

## 2016-09-13 NOTE — Telephone Encounter (Signed)
ATC pt phone just rung, unable to leave a vm

## 2016-09-13 NOTE — Telephone Encounter (Signed)
Pt's wife requesting sx clearance to be refaxed to [facility] at below verified fax #.  This has been faxed.  Nothing further needed.

## 2016-09-13 NOTE — Telephone Encounter (Signed)
Pt wife returning call to check on status surgery clearence, and can be reached @ [phone], he has been cleared by all his other Dr. Except [place] need ASAP so surgery can be sched pt was told it would be done the day he came in office and they still haven't received anything This needs to be sent to the attn of Oneal Grout @ [facility] fax # [phone] .Caren Griffins

## 2016-09-14 ENCOUNTER — Other Ambulatory Visit: Payer: Self-pay | Admitting: *Deleted

## 2016-09-14 ENCOUNTER — Telehealth: Payer: Self-pay | Admitting: *Deleted

## 2016-09-14 DIAGNOSIS — E039 Hypothyroidism, unspecified: Secondary | ICD-10-CM

## 2016-09-14 MED ORDER — LEVOTHYROXINE SODIUM 125 MCG PO TABS: 125.0000 ug | ORAL_TABLET | Freq: Every day | ORAL | 6 refills | Status: DC

## 2016-09-14 NOTE — Telephone Encounter (Signed)
-----   Message from Lennette Bihari, MD sent at 09/11/2016 11:14 PM EDT ----- TSH is over suppressed; need to reduce levothyroxine from .175 to .125 mg and f/u with Dr. Juleen China with repeat TSH in 4 weeks

## 2016-09-14 NOTE — Telephone Encounter (Signed)
Left lab results and recommendations on wife's voice mail. ( ok per dpr). New levothyroxine prescription sent to pharmacy.

## 2016-09-19 ENCOUNTER — Telehealth: Payer: Self-pay | Admitting: *Deleted

## 2016-09-19 NOTE — Telephone Encounter (Signed)
Faxed surgical clearance to [facility] for patient to have right shoulder surgery.

## 2016-09-20 ENCOUNTER — Ambulatory Visit (HOSPITAL_COMMUNITY)
Admission: RE | Admit: 2016-09-20 | Discharge: 2016-09-20 | Disposition: A | Discharge status: Home / Self Care | Payer: BLUE CROSS/BLUE SHIELD | Source: Ambulatory Visit | Attending: Cardiovascular Disease | Admitting: Cardiovascular Disease

## 2016-09-20 DIAGNOSIS — I700 Atherosclerosis of aorta: Secondary | ICD-10-CM | POA: Insufficient documentation

## 2016-09-20 DIAGNOSIS — I714 Abdominal aortic aneurysm, without rupture, unspecified: Secondary | ICD-10-CM

## 2016-09-20 DIAGNOSIS — I771 Stricture of artery: Secondary | ICD-10-CM | POA: Diagnosis not present

## 2016-09-22 ENCOUNTER — Other Ambulatory Visit: Payer: Self-pay | Admitting: Cardiovascular Disease

## 2016-09-25 ENCOUNTER — Telehealth: Payer: Self-pay | Admitting: *Deleted

## 2016-09-25 DIAGNOSIS — M19011 Primary osteoarthritis, right shoulder: Secondary | ICD-10-CM | POA: Diagnosis not present

## 2016-09-25 DIAGNOSIS — M7541 Impingement syndrome of right shoulder: Secondary | ICD-10-CM | POA: Diagnosis not present

## 2016-09-25 DIAGNOSIS — M75101 Unspecified rotator cuff tear or rupture of right shoulder, not specified as traumatic: Secondary | ICD-10-CM | POA: Diagnosis not present

## 2016-09-25 HISTORY — PX: BICEPS TENDON REPAIR: SHX566

## 2016-09-25 NOTE — Telephone Encounter (Signed)
Left result message on patient's home voicemail. Okay per (DPR). Call back if questions or concerns.

## 2016-09-25 NOTE — Telephone Encounter (Signed)
-----   Message from Lennette Bihari, MD sent at 09/22/2016  5:03 PM EDT ----- AAA stable without signifiant increase; f/u doppler in 1 year

## 2016-09-28 ENCOUNTER — Inpatient Hospital Stay (HOSPITAL_COMMUNITY)
Admission: EM | Admit: 2016-09-28 | Discharge: 2016-10-03 | DRG: 287 | Disposition: A | Discharge status: Home / Self Care | Payer: BLUE CROSS/BLUE SHIELD | Attending: Internal Medicine | Admitting: Internal Medicine

## 2016-09-28 ENCOUNTER — Encounter (HOSPITAL_COMMUNITY): Payer: Self-pay | Admitting: Neurology

## 2016-09-28 ENCOUNTER — Emergency Department (HOSPITAL_COMMUNITY): Payer: BLUE CROSS/BLUE SHIELD

## 2016-09-28 DIAGNOSIS — F0390 Unspecified dementia without behavioral disturbance: Secondary | ICD-10-CM | POA: Diagnosis present

## 2016-09-28 DIAGNOSIS — I25110 Atherosclerotic heart disease of native coronary artery with unstable angina pectoris: Secondary | ICD-10-CM | POA: Diagnosis not present

## 2016-09-28 DIAGNOSIS — I252 Old myocardial infarction: Secondary | ICD-10-CM

## 2016-09-28 DIAGNOSIS — J439 Emphysema, unspecified: Secondary | ICD-10-CM | POA: Diagnosis present

## 2016-09-28 DIAGNOSIS — Z955 Presence of coronary angioplasty implant and graft: Secondary | ICD-10-CM

## 2016-09-28 DIAGNOSIS — I200 Unstable angina: Secondary | ICD-10-CM | POA: Diagnosis not present

## 2016-09-28 DIAGNOSIS — Z79899 Other long term (current) drug therapy: Secondary | ICD-10-CM

## 2016-09-28 DIAGNOSIS — E039 Hypothyroidism, unspecified: Secondary | ICD-10-CM | POA: Diagnosis present

## 2016-09-28 DIAGNOSIS — Z7982 Long term (current) use of aspirin: Secondary | ICD-10-CM

## 2016-09-28 DIAGNOSIS — R7989 Other specified abnormal findings of blood chemistry: Secondary | ICD-10-CM

## 2016-09-28 DIAGNOSIS — Z888 Allergy status to other drugs, medicaments and biological substances status: Secondary | ICD-10-CM

## 2016-09-28 DIAGNOSIS — R778 Other specified abnormalities of plasma proteins: Secondary | ICD-10-CM

## 2016-09-28 DIAGNOSIS — M7989 Other specified soft tissue disorders: Secondary | ICD-10-CM

## 2016-09-28 DIAGNOSIS — R748 Abnormal levels of other serum enzymes: Secondary | ICD-10-CM | POA: Diagnosis not present

## 2016-09-28 DIAGNOSIS — R061 Stridor: Secondary | ICD-10-CM | POA: Diagnosis not present

## 2016-09-28 DIAGNOSIS — J430 Unilateral pulmonary emphysema [MacLeod's syndrome]: Secondary | ICD-10-CM | POA: Diagnosis not present

## 2016-09-28 DIAGNOSIS — R0600 Dyspnea, unspecified: Secondary | ICD-10-CM | POA: Diagnosis not present

## 2016-09-28 DIAGNOSIS — Z87891 Personal history of nicotine dependence: Secondary | ICD-10-CM

## 2016-09-28 DIAGNOSIS — M25511 Pain in right shoulder: Secondary | ICD-10-CM | POA: Diagnosis present

## 2016-09-28 DIAGNOSIS — R001 Bradycardia, unspecified: Secondary | ICD-10-CM | POA: Diagnosis present

## 2016-09-28 DIAGNOSIS — K219 Gastro-esophageal reflux disease without esophagitis: Secondary | ICD-10-CM | POA: Diagnosis present

## 2016-09-28 DIAGNOSIS — R110 Nausea: Secondary | ICD-10-CM | POA: Diagnosis present

## 2016-09-28 DIAGNOSIS — Z7902 Long term (current) use of antithrombotics/antiplatelets: Secondary | ICD-10-CM

## 2016-09-28 DIAGNOSIS — E785 Hyperlipidemia, unspecified: Secondary | ICD-10-CM | POA: Diagnosis present

## 2016-09-28 DIAGNOSIS — I10 Essential (primary) hypertension: Secondary | ICD-10-CM | POA: Diagnosis present

## 2016-09-28 DIAGNOSIS — R0609 Other forms of dyspnea: Secondary | ICD-10-CM | POA: Diagnosis not present

## 2016-09-28 DIAGNOSIS — I714 Abdominal aortic aneurysm, without rupture: Secondary | ICD-10-CM | POA: Diagnosis present

## 2016-09-28 DIAGNOSIS — I447 Left bundle-branch block, unspecified: Secondary | ICD-10-CM | POA: Diagnosis present

## 2016-09-28 DIAGNOSIS — L930 Discoid lupus erythematosus: Secondary | ICD-10-CM | POA: Diagnosis present

## 2016-09-28 DIAGNOSIS — Z825 Family history of asthma and other chronic lower respiratory diseases: Secondary | ICD-10-CM

## 2016-09-28 DIAGNOSIS — I69311 Memory deficit following cerebral infarction: Secondary | ICD-10-CM

## 2016-09-28 DIAGNOSIS — Z8249 Family history of ischemic heart disease and other diseases of the circulatory system: Secondary | ICD-10-CM

## 2016-09-28 DIAGNOSIS — Z882 Allergy status to sulfonamides status: Secondary | ICD-10-CM

## 2016-09-28 DIAGNOSIS — R9439 Abnormal result of other cardiovascular function study: Secondary | ICD-10-CM

## 2016-09-28 DIAGNOSIS — G4730 Sleep apnea, unspecified: Secondary | ICD-10-CM | POA: Diagnosis present

## 2016-09-28 DIAGNOSIS — G8918 Other acute postprocedural pain: Secondary | ICD-10-CM | POA: Diagnosis present

## 2016-09-28 DIAGNOSIS — J449 Chronic obstructive pulmonary disease, unspecified: Secondary | ICD-10-CM | POA: Diagnosis not present

## 2016-09-28 DIAGNOSIS — R079 Chest pain, unspecified: Secondary | ICD-10-CM | POA: Diagnosis present

## 2016-09-28 DIAGNOSIS — R0789 Other chest pain: Secondary | ICD-10-CM | POA: Diagnosis not present

## 2016-09-28 HISTORY — DX: Low back pain, unspecified: M54.50

## 2016-09-28 HISTORY — DX: Dermatitis, unspecified: L30.9

## 2016-09-28 HISTORY — DX: Other local lupus erythematosus: L93.2

## 2016-09-28 HISTORY — DX: Unspecified dementia, unspecified severity, without behavioral disturbance, psychotic disturbance, mood disturbance, and anxiety: F03.90

## 2016-09-28 HISTORY — DX: Other chronic pain: G89.29

## 2016-09-28 HISTORY — DX: Low back pain: M54.5

## 2016-09-28 LAB — I-STAT CHEM 8, ED
BUN: 15 mg/dL (ref 6–20)
CREATININE: 0.9 mg/dL (ref 0.61–1.24)
Calcium, Ion: 1.14 mmol/L — ABNORMAL LOW (ref 1.15–1.40)
Chloride: 101 mmol/L (ref 101–111)
GLUCOSE: 92 mg/dL (ref 65–99)
HCT: 46 % (ref 39.0–52.0)
Hemoglobin: 15.6 g/dL (ref 13.0–17.0)
Potassium: 3.5 mmol/L (ref 3.5–5.1)
Sodium: 142 mmol/L (ref 135–145)
TCO2: 29 mmol/L (ref 0–100)

## 2016-09-28 LAB — CBC
HCT: 42.1 % (ref 39.0–52.0)
Hemoglobin: 13.9 g/dL (ref 13.0–17.0)
MCH: 28 pg (ref 26.0–34.0)
MCHC: 33 g/dL (ref 30.0–36.0)
MCV: 84.7 fL (ref 78.0–100.0)
PLATELETS: 282 10*3/uL (ref 150–400)
RBC: 4.97 MIL/uL (ref 4.22–5.81)
RDW: 12.7 % (ref 11.5–15.5)
WBC: 8.6 10*3/uL (ref 4.0–10.5)

## 2016-09-28 LAB — I-STAT TROPONIN, ED: TROPONIN I, POC: 0 ng/mL (ref 0.00–0.08)

## 2016-09-28 LAB — CREATININE, SERUM
CREATININE: 0.88 mg/dL (ref 0.61–1.24)
GFR calc Af Amer: >60 mL/min (ref >60)
GFR calc non Af Amer: >60 mL/min (ref >60)

## 2016-09-28 LAB — CBC WITH DIFFERENTIAL/PLATELET
Basophils Absolute: 0.1 10*3/uL (ref 0.0–0.1)
Basophils Relative: 1 %
EOS PCT: 4 %
Eosinophils Absolute: 0.4 10*3/uL (ref 0.0–0.7)
HCT: 44.8 % (ref 39.0–52.0)
Hemoglobin: 14.7 g/dL (ref 13.0–17.0)
LYMPHS ABS: 1.4 10*3/uL (ref 0.7–4.0)
LYMPHS PCT: 14 %
MCH: 28.3 pg (ref 26.0–34.0)
MCHC: 32.8 g/dL (ref 30.0–36.0)
MCV: 86.2 fL (ref 78.0–100.0)
MONO ABS: 0.9 10*3/uL (ref 0.1–1.0)
Monocytes Relative: 9 %
Neutro Abs: 7.2 10*3/uL (ref 1.7–7.7)
Neutrophils Relative %: 72 %
PLATELETS: 282 10*3/uL (ref 150–400)
RBC: 5.2 MIL/uL (ref 4.22–5.81)
RDW: 12.7 % (ref 11.5–15.5)
WBC: 9.9 10*3/uL (ref 4.0–10.5)

## 2016-09-28 LAB — BRAIN NATRIURETIC PEPTIDE: B NATRIURETIC PEPTIDE 5: 185.2 pg/mL — AB (ref 0.0–100.0)

## 2016-09-28 LAB — TROPONIN I
TROPONIN I: 0.04 ng/mL — AB (ref <0.03)
TROPONIN I: 0.05 ng/mL — AB (ref <0.03)
Troponin I: 0.05 ng/mL (ref <0.03)

## 2016-09-28 MED ORDER — MORPHINE SULFATE (PF) 4 MG/ML IV SOLN: 2.0000 mg | INTRAVENOUS | Status: DC | PRN

## 2016-09-28 MED ORDER — HYDROCHLOROTHIAZIDE 12.5 MG PO CAPS: 12.5000 mg | ORAL_CAPSULE | ORAL | Status: DC

## 2016-09-28 MED ORDER — IOPAMIDOL (ISOVUE-370) INJECTION 76%
INTRAVENOUS | Status: AC
Start: 2016-09-28 — End: 2016-09-28
  Administered 2016-09-28: 100 mL
  Filled 2016-09-28: qty 100

## 2016-09-28 MED ORDER — LOSARTAN POTASSIUM 50 MG PO TABS
50.0000 mg | ORAL_TABLET | Freq: Every day | ORAL | Status: DC
  Administered 2016-09-28 – 2016-10-02 (×5): 50 mg via ORAL
  Filled 2016-09-28 (×5): qty 1

## 2016-09-28 MED ORDER — ISOSORBIDE MONONITRATE ER 60 MG PO TB24
90.0000 mg | ORAL_TABLET | Freq: Every day | ORAL | Status: DC
  Administered 2016-09-28 – 2016-10-02 (×5): 90 mg via ORAL
  Filled 2016-09-28 (×5): qty 1

## 2016-09-28 MED ORDER — OXYCODONE-ACETAMINOPHEN 5-325 MG PO TABS
2.0000 | ORAL_TABLET | Freq: Once | ORAL | Status: AC
  Administered 2016-09-28: 2 via ORAL
  Filled 2016-09-28: qty 2

## 2016-09-28 MED ORDER — ACETAMINOPHEN 325 MG PO TABS
650.0000 mg | ORAL_TABLET | ORAL | Status: DC | PRN
  Administered 2016-09-29: 650 mg via ORAL
  Filled 2016-09-28: qty 2

## 2016-09-28 MED ORDER — ONDANSETRON HCL 4 MG/2ML IJ SOLN: 4.0000 mg | Freq: Four times a day (QID) | INTRAMUSCULAR | Status: DC | PRN

## 2016-09-28 MED ORDER — HYDRALAZINE HCL 20 MG/ML IJ SOLN
10.0000 mg | Freq: Three times a day (TID) | INTRAMUSCULAR | Status: DC | PRN
  Filled 2016-09-28: qty 1

## 2016-09-28 MED ORDER — CLOPIDOGREL BISULFATE 75 MG PO TABS
75.0000 mg | ORAL_TABLET | Freq: Every day | ORAL | Status: DC
  Administered 2016-09-28: 75 mg via ORAL
  Filled 2016-09-28: qty 1

## 2016-09-28 MED ORDER — AMLODIPINE BESYLATE 5 MG PO TABS
5.0000 mg | ORAL_TABLET | Freq: Every day | ORAL | Status: DC
  Administered 2016-09-28 – 2016-10-03 (×6): 5 mg via ORAL
  Filled 2016-09-28 (×6): qty 1

## 2016-09-28 MED ORDER — CLOPIDOGREL BISULFATE 75 MG PO TABS
75.0000 mg | ORAL_TABLET | Freq: Every day | ORAL | Status: DC
  Administered 2016-09-29 – 2016-10-03 (×5): 75 mg via ORAL
  Filled 2016-09-28 (×5): qty 1

## 2016-09-28 MED ORDER — UMECLIDINIUM-VILANTEROL 62.5-25 MCG/INH IN AEPB
1.0000 | INHALATION_SPRAY | Freq: Every day | RESPIRATORY_TRACT | Status: DC
  Administered 2016-09-29 – 2016-10-01 (×3): 1 via RESPIRATORY_TRACT
  Filled 2016-09-28 (×3): qty 14

## 2016-09-28 MED ORDER — METOPROLOL SUCCINATE ER 25 MG PO TB24
25.0000 mg | ORAL_TABLET | Freq: Every day | ORAL | Status: DC
  Administered 2016-09-28 – 2016-10-02 (×5): 25 mg via ORAL
  Filled 2016-09-28 (×5): qty 1

## 2016-09-28 MED ORDER — OXYCODONE-ACETAMINOPHEN 5-325 MG PO TABS
1.0000 | ORAL_TABLET | ORAL | Status: DC | PRN
  Administered 2016-09-28 – 2016-10-03 (×17): 1 via ORAL
  Filled 2016-09-28 (×18): qty 1

## 2016-09-28 MED ORDER — ZOLPIDEM TARTRATE 5 MG PO TABS
5.0000 mg | ORAL_TABLET | Freq: Every evening | ORAL | Status: DC | PRN
  Administered 2016-09-28 – 2016-10-02 (×2): 5 mg via ORAL
  Filled 2016-09-28 (×3): qty 1

## 2016-09-28 MED ORDER — LEVOTHYROXINE SODIUM 200 MCG PO TABS
200.0000 ug | ORAL_TABLET | Freq: Every day | ORAL | Status: DC
  Administered 2016-09-28 – 2016-10-02 (×5): 200 ug via ORAL
  Filled 2016-09-28: qty 2
  Filled 2016-09-28: qty 1
  Filled 2016-09-28: qty 4
  Filled 2016-09-28: qty 2
  Filled 2016-09-28: qty 1
  Filled 2016-09-28 (×2): qty 2
  Filled 2016-09-28 (×2): qty 1

## 2016-09-28 MED ORDER — HEPARIN SODIUM (PORCINE) 5000 UNIT/ML IJ SOLN
5000.0000 [IU] | Freq: Three times a day (TID) | INTRAMUSCULAR | Status: DC
  Administered 2016-09-28 – 2016-10-01 (×9): 5000 [IU] via SUBCUTANEOUS
  Filled 2016-09-28 (×9): qty 1

## 2016-09-28 MED ORDER — ASPIRIN EC 325 MG PO TBEC
325.0000 mg | DELAYED_RELEASE_TABLET | Freq: Every day | ORAL | Status: DC
  Administered 2016-09-29 – 2016-10-03 (×4): 325 mg via ORAL
  Filled 2016-09-28 (×4): qty 1

## 2016-09-28 NOTE — ED Triage Notes (Signed)
Per ems- pt comes from home c/o sob, left sided cp this morning at 0630 while sitting in chair. He became diaphoretic, took 324 aspirin. Had right shoulder surgery on Monday for bone spurs, and rotator cuff repair. He was off plavix for 5 days. CP is intermittent, none at this time. BP 174/97, HR 99, 98% 2 L. Is a x 4. Has cardiac hx with MI.

## 2016-09-28 NOTE — Consult Note (Signed)
CONSULTATION NOTE   Patient Name: Matthew Evans Date of Encounter: 09/28/2016 Cardiologist: Dr. Clearnce Hasten Problem List   Principal Problem:   Chest pain Active Problems:   Hypothyroid   COPD (chronic obstructive pulmonary disease) (HCC)   Post-op pain    HPI   Matthew Evans is a 66 y.o. male who is being seen today for the evaluation of NSTEMI at the request of Dr. Aggie Moats. Matthew Evans has a history of coronary artery disease and history of ST elevation MI in 2009 was to the RCA. As his coronary disease in the LAD and high-grade LAD stenosis and underwent cutting balloon angioplasty. He since had a number of other cardiac catheterization showing mild to moderate coronary disease. He also underwent Nissen fundoplication for hiatal hernia in 2015. Exchange was found to have an EF of 40-45% with diffuse hypokinesis on echo. The stress test performed which apparently was low risk. Bundle-branch block. He also has an unrepaired abdominal aortic aneurysm measuring 2.7 x 3.5 cm. He presents with chest pain. He reportedly had sudden onset chest pain that last a few seconds. It was left-sided and not at all like the symptoms he had prior to his heart attack (weakness). He reported being diaphoretic *but he gets this spontaneously, all-the-time", and denied any radiation to his neck and jaw or left arm. He recently had shoulder surgery this past Monday and was temporarily off aspirin and Plavix but has since resumed that. On presentation to emergency department initial troponin was elevated mildly at 0.04, previous valve was zero, 6 hours prior. BNP mildly elevated 185. Otherwise, lab work is unremarkable.  PMHx   Past Medical History:  Diagnosis Date  . AAA (abdominal aortic aneurysm) (HCC)    mild; doppler 08/29/12-3.4x3.7  . Arthritis    "hands, back" (09/28/2016)  . CAD (coronary artery disease)    echo 07/26/12-EF 55-60%; myoview 07/26/12-low normal wall motion with mild apical hypokinesis as  well a septal wall motion consistent with LBBB  . Carotid artery occlusion   . Chronic lower back pain   . Cutaneous lupus erythematosus    [name], [name]  . Dementia    "moderate; dx'd in ~ 2011" (09/28/2016)  . Dermatitis   . Emphysema   . GERD (gastroesophageal reflux disease)   . H/O hiatal hernia   . Headache(784.0)   . History of palpitations   . HTN (hypertension)   . Hyperlipidemia   . Hypothyroidism    HX OF RADIOACTIVE IODINE   . Memory loss    d/t mini stroke  . Mini stroke (Matthew Evans) ~ 2011  . Myocardial infarction ([place]) 09/2007   PCI to RCA  . Shortness of breath    WITH EXERTION  . Sleep apnea    sleep study 12/14/06 ahi 16.89, during rem 12.3, supine ahi 57.49; cpap 02/22/07-c-flex of 3 at 13cm h2o; PT STATES HE DOES NOT USE HIS CPAP EVERY NIGHT  . Sleep apnea    "aien't worn mask in 3 years" (09/28/2016)  . Stroke [facility])    PT STATES NEUROLOGIST TOLD HIM HE HAD HAD MINI STROKE  . Thyroid disease    hypothyroidism    Past Surgical History:  Procedure Laterality Date  . balloon atherotomy     LAD  . BICEPS TENDON REPAIR Right 09/25/2016   "cleaned out spurs and arthritis" (09/28/2016)  . CARDIAC CATHETERIZATION  08/11/11   EF50-55%,  small AAA, no signifiant stent restenosis in RCA , no restenosis of PTCA of LAD, medical therapy  . CARDIAC CATHETERIZATION  09/27/06   cutting balloon arthrotomy of LAD stenosis with dilatation of a 3.5x77m cutting balloon  . CARDIAC CATHETERIZATION  01/07/09   50-60% progressive dz beyond the stented segment in the RCA , LAD intervention remained patent  . CORONARY ANGIOPLASTY WITH STENT PLACEMENT  09/24/06   PTCA/stent of RCA with a 3.5x230mTaxus DES post dilated to 3.9688m. CORONARY ANGIOPLASTY WITH STENT PLACEMENT Right 09/2007  . ESOPHAGEAL MANOMETRY N/A 08/04/2013   Procedure: ESOPHAGEAL MANOMETRY (EM);  Surgeon: PatBeryle BeamsD;  Location: [facility];   Service: Endoscopy;  Laterality: N/A;  . HERNIA REPAIR    . HIATAL HERNIA REPAIR  2011?  . LMarland KitchenFT HEART CATHETERIZATION WITH CORONARY ANGIOGRAM N/A 08/11/2011   Procedure: LEFT HEART CATHETERIZATION WITH CORONARY ANGIOGRAM;  Surgeon: ThoTroy SineD;  [facility];  Service: Cardiovascular;  Laterality: N/A;    FAMHx   Family History  Problem Relation Age of Onset  . Emphysema Mother   . Heart disease Father   . Mesothelioma Father   . Heart attack Father     SOCHx    reports that he quit smoking about 6 years ago. His smoking use included Cigarettes. He has a 90.00 pack-year smoking history. He has never used smokeless tobacco. He reports that he does not drink alcohol or use drugs.  Outpatient Medications   No current facility-administered medications on file prior to encounter.    Current Outpatient Prescriptions on File Prior to Encounter  Medication Sig Dispense Refill  . amLODipine (NORVASC) 5 MG tablet TAKE 1 TABLET (5 MG TOTAL) BY MOUTH EVERY MORNING. 30 tablet 0  . aspirin EC 81 MG tablet Take 81 mg by mouth daily.      . clopidogrel (PLAVIX) 75 MG tablet TAKE 1 TABLET (75 MG TOTAL) BY MOUTH DAILY WITH BREAKFAST. 30 tablet 0  . hydrochlorothiazide (MICROZIDE) 12.5 MG capsule Take 1 capsule (12.5 mg total) by mouth every other day. 30 capsule 2  . isosorbide mononitrate (IMDUR) 60 MG 24 hr tablet TAKE 1.5 TABLETS BY MOUTH DAILY 45 tablet 0  . losartan (COZAAR) 50 MG tablet Take 1 tablet (50 mg total) by mouth daily. 30 tablet 11  . metoprolol succinate (TOPROL-XL) 25 MG 24 hr tablet Take 25 mg by mouth daily.    . Omega-3 Fatty Acids (FISH OIL) 1200 MG CAPS Take 1,200 mg by mouth 2 (two) times daily.     . uMarland Kitcheneclidinium-vilanterol (ANORO ELLIPTA) 62.5-25 MCG/INH AEPB INHALE 1 PUFF INTO THE LUNGS DAILY. 60 each 5  . cyclobenzaprine (FLEXERIL) 10 MG tablet Take 10 mg by mouth 2 (two) times daily as needed for muscle spasms.   0    Inpatient Medications      Scheduled Meds: . amLODipine  5 mg Oral Daily  . [START ON 09/29/2016] aspirin EC  325 mg Oral Daily  . clopidogrel  75 mg Oral Daily  . heparin  5,000 Units Subcutaneous Q8H  . [START ON 09/29/2016] hydrochlorothiazide  12.5 mg Oral QODAY  . isosorbide mononitrate  90 mg Oral Daily  . [START ON 09/29/2016] levothyroxine  200 mcg Oral QAC breakfast  . losartan  50 mg Oral Daily  . metoprolol succinate  25 mg Oral Daily  . umeclidinium-vilanterol  1 puff Inhalation Daily    Continuous Infusions: None  PRN Meds: acetaminophen, hydrALAZINE, morphine injection, ondansetron (ZOFRAN)  IV, oxyCODONE-acetaminophen, zolpidem   ALLERGIES   Allergies  Allergen Reactions  . Niaspan [Niacin] Rash  . Septra [Sulfamethoxazole-Trimethoprim] Rash  . Simcor [Niacin-Simvastatin Er] Rash  . Zocor [Simvastatin] Rash    ROS   Pertinent items noted in HPI and remainder of comprehensive ROS otherwise negative.  Vitals   Vitals:   09/28/16 1312 09/28/16 1400 09/28/16 1415 09/28/16 1511  BP: (!) 146/83 129/66 132/87 (!) 157/57  Pulse: 80 86 85 61  Resp:    18  Temp:    97.6 F (36.4 C)  TempSrc:    Oral  SpO2: 96% 95% 94% 98%  Weight:    212 lb 8 oz (96.4 kg)  Height:    '5\' 8"'  (1.727 m)   No intake or output data in the 24 hours ending 09/28/16 1810 Filed Weights   09/28/16 0919 09/28/16 1511  Weight: 210 lb (95.3 kg) 212 lb 8 oz (96.4 kg)    Physical Exam   General appearance: alert and no distress Neck: no carotid bruit and no JVD Lungs: clear to auscultation bilaterally Heart: regular rate and rhythm Abdomen: soft, non-tender; bowel sounds normal; no masses,  no organomegaly Extremities: extremities normal, atraumatic, no cyanosis or edema Pulses: 2+ and symmetric Skin: Skin color, texture, turgor normal. No rashes or lesions Neurologic: Grossly normal Psych: Pleasant  Labs   Results for orders placed or performed during the hospital encounter of 09/28/16 (from the past 48  hour(s))  Brain natriuretic peptide     Status: Abnormal   Collection Time: 09/28/16  9:20 AM  Result Value Ref Range   B Natriuretic Peptide 185.2 (H) 0.0 - 100.0 pg/mL  CBC with Differential     Status: None   Collection Time: 09/28/16  9:20 AM  Result Value Ref Range   WBC 9.9 4.0 - 10.5 K/uL   RBC 5.20 4.22 - 5.81 MIL/uL   Hemoglobin 14.7 13.0 - 17.0 g/dL   HCT 44.8 39.0 - 52.0 %   MCV 86.2 78.0 - 100.0 fL   MCH 28.3 26.0 - 34.0 pg   MCHC 32.8 30.0 - 36.0 g/dL   RDW 12.7 11.5 - 15.5 %   Platelets 282 150 - 400 K/uL   Neutrophils Relative % 72 %   Neutro Abs 7.2 1.7 - 7.7 K/uL   Lymphocytes Relative 14 %   Lymphs Abs 1.4 0.7 - 4.0 K/uL   Monocytes Relative 9 %   Monocytes Absolute 0.9 0.1 - 1.0 K/uL   Eosinophils Relative 4 %   Eosinophils Absolute 0.4 0.0 - 0.7 K/uL   Basophils Relative 1 %   Basophils Absolute 0.1 0.0 - 0.1 K/uL  I-stat troponin, ED     Status: None   Collection Time: 09/28/16  9:57 AM  Result Value Ref Range   Troponin i, poc 0.00 0.00 - 0.08 ng/mL   Comment 3            Comment: Due to the release kinetics of cTnI, a negative result within the first hours of the onset of symptoms does not rule out myocardial infarction with certainty. If myocardial infarction is still suspected, repeat the test at appropriate intervals.   I-stat Chem 8, ED     Status: Abnormal   Collection Time: 09/28/16  9:59 AM  Result Value Ref Range   Sodium 142 135 - 145 mmol/L   Potassium 3.5 3.5 - 5.1 mmol/L   Chloride 101 101 - 111 mmol/L   BUN 15 6 - 20  mg/dL   Creatinine, Ser 0.90 0.61 - 1.24 mg/dL   Glucose, Bld 92 65 - 99 mg/dL   Calcium, Ion 1.14 (L) 1.15 - 1.40 mmol/L   TCO2 29 0 - 100 mmol/L   Hemoglobin 15.6 13.0 - 17.0 g/dL   HCT 46.0 39.0 - 52.0 %  Troponin I-serum (0, 3, 6 hours)     Status: Abnormal   Collection Time: 09/28/16  2:52 PM  Result Value Ref Range   Troponin I 0.04 (HH) <0.03 ng/mL    Comment: CRITICAL RESULT CALLED TO, READ BACK BY AND  VERIFIED WITH: J.SMITH,RN 09/28/16 @ 1548 BY N.LIVINGSTON   CBC     Status: None   Collection Time: 09/28/16  2:52 PM  Result Value Ref Range   WBC 8.6 4.0 - 10.5 K/uL   RBC 4.97 4.22 - 5.81 MIL/uL   Hemoglobin 13.9 13.0 - 17.0 g/dL   HCT 42.1 39.0 - 52.0 %   MCV 84.7 78.0 - 100.0 fL   MCH 28.0 26.0 - 34.0 pg   MCHC 33.0 30.0 - 36.0 g/dL   RDW 12.7 11.5 - 15.5 %   Platelets 282 150 - 400 K/uL  Creatinine, serum     Status: None   Collection Time: 09/28/16  2:52 PM  Result Value Ref Range   Creatinine, Ser 0.88 0.61 - 1.24 mg/dL   GFR calc non Af Amer >60 >60 mL/min   GFR calc Af Amer >60 >60 mL/min    Comment: (NOTE) The eGFR has been calculated using the CKD EPI equation. This calculation has not been validated in all clinical situations. eGFR's persistently <60 mL/min signify possible Chronic Kidney Disease.     ECG  Normal sinus rhythm at 67, anterolateral infarct pattern - Personally Reviewed  Sinus rhythm - Personally Reviewed  Radiology   Dg Chest 2 View  Result Date: 09/28/2016 CLINICAL DATA:  Shortness of breath. EXAM: CHEST  2 VIEW COMPARISON:  08/29/2016 FINDINGS: Heart and mediastinal contours are within normal limits. No focal opacities or effusions. No acute bony abnormality. IMPRESSION: No active cardiopulmonary disease. Electronically Signed   By: Rolm Baptise M.D.   On: 09/28/2016 10:16   Ct Angio Chest Pe W And/or Wo Contrast  Result Date: 09/28/2016 CLINICAL DATA:  Shortness of breath. Left-sided chest pain. Recent right shoulder surgery. EXAM: CT ANGIOGRAPHY CHEST WITH CONTRAST TECHNIQUE: Multidetector CT imaging of the chest was performed using the standard protocol during bolus administration of intravenous contrast. Multiplanar CT image reconstructions and MIPs were obtained to evaluate the vascular anatomy. CONTRAST:  100 mL Isovue 370 COMPARISON:  None. FINDINGS: Cardiovascular: Negative for pulmonary embolism. Atherosclerotic calcifications at the  aortic arch. Caliber of the thoracic aorta is within normal limits. Atherosclerotic calcification involving the descending thoracic aorta. Mediastinum/Nodes: Small lymph nodes throughout the mediastinum. Overall, no significant lymph node enlargement in the chest. Mild soft tissue edema in the right upper chest probably related to recent shoulder surgery. No significant pericardial fluid. Lungs/Pleura: Trachea and mainstem bronchi are patent. Small nodular densities along the right minor fissure best seen on the sagittal reformats, image 34, sequence 9. Largest nodule measures 6 mm in this area. Small amount of dependent atelectasis in the left lower lobe. No large areas of consolidation or airspace disease. Probable scarring along the posterolateral right chest related to old right rib fractures. Upper Abdomen: No acute abnormality in upper abdomen. Musculoskeletal: Bridging osteophytes in the lower thoracic spine. Multiple old right rib fractures along the posterior and lateral  aspect. Review of the MIP images confirms the above findings. IMPRESSION: Negative for a pulmonary embolism. Few patchy and nodular densities along the right minor fissure are nonspecific. There is a nodular component measuring up to 6 mm. Non-contrast chest CT at 6-12 months is recommended. If the nodule is stable at time of repeat CT, then future CT at 18-24 months (from today's scan) is considered optional for low-risk patients, but is recommended for high-risk patients. This recommendation follows the consensus statement: Guidelines for Management of Incidental Pulmonary Nodules Detected on CT Images: From the Fleischner Society 2017; Radiology 2017; 284:228-243. Old right rib fractures with adjacent scarring. Electronically Signed   By: Markus Daft M.D.   On: 09/28/2016 13:06    Cardiac Studies   None  Impression   1. Principal Problem: 2.   Chest pain 3. Active Problems: 4.   Hypothyroid 5.   COPD (chronic obstructive  pulmonary disease) ([place]) 6.   Post-op pain 7. Elevated troponin 8. DOE  Recommendation   Mr. Tennyson had very brief chest pain and more shortness of breath.  Initial troponin was negative, however,  subsequent troponin elevated at 0.04. CT scan in the ER was negative for PE. Symptoms sound atypical for angina, but given his significant coronary history, I would recommend continued work-up overnight and we will consider stress testing or cardiac cath tomorrow. Please keep NPO p MN. Echo is pending.  Thanks for the consultation.  Time Spent Directly with Patient:  45 minutes  Length of Stay:  LOS: 0 days   Pixie Casino, MD, [place]  Attending Cardiologist  Direct Dial: [phone]  Fax: [phone]  Website: [email or web address]   Nadean Corwin Johndavid Geralds 09/28/2016, 6:10 PM

## 2016-09-28 NOTE — H&P (Signed)
[facility] History and Physical  Matthew Evans ZOX:[id number] DOB: 12-15-1950 DOA: 09/28/2016  Referring physician:  PCP: Michiel Sites, MD   Chief Complaint: "I was having chest pain and sweating just like my last heart attack."  HPI: Matthew Evans is a 65 y.o. male  with past mental history of heart attack, lupus, coronary artery disease, high blood pressure and COPD presents to the hospital with chief complaint of chest pain. Patient states he had a sudden onset of chest pain the last a few minutes. Left-sided chest pressure. Similar to her prior heart attack. Patient was diaphoretic. Denies nausea vomiting. Denies a chest trauma. No recent coughing. Of note patient went into for recent shoulder surgery and was temporarily off of his aspirin and Plavix.  ED course: Initial chest x-ray, EKG and troponin negative. EDP requested admission to hospitalist service.   Review of Systems:  As per HPI otherwise 10 point review of systems negative.    Past Medical History:  Diagnosis Date  . AAA (abdominal aortic aneurysm) (HCC)    mild; doppler 08/29/12-3.4x3.7  . Arthritis   . CAD (coronary artery disease)    echo 07/26/12-EF 55-60%; myoview 07/26/12-low normal wall motion with mild apical hypokinesis as well a septal wall motion consistent with LBBB  . Carotid artery occlusion   . Emphysema   . GERD (gastroesophageal reflux disease)   . H/O hiatal hernia   . Headache(784.0)   . History of palpitations   . HTN (hypertension)   . Hyperlipidemia   . Hypothyroidism    HX OF RADIOACTIVE IODINE   . Lupus    [name], [name]  . Memory loss    d/t mini stroke  . Mini stroke (HCC)   . Myocardial infarction (HCC) 09/2007   PCI to RCA  . Shortness of breath    WITH EXERTION  . Sleep apnea    sleep study 12/14/06 ahi 16.89, during rem 12.3, supine ahi 57.49; cpap 02/22/07-c-flex of 3 at 13cm h2o; PT STATES HE DOES  NOT USE HIS CPAP EVERY NIGHT  . Stroke [facility])    PT STATES NEUROLOGIST TOLD HIM HE HAD HAD MINI STROKE  . Thyroid disease    hypothyroidism   Past Surgical History:  Procedure Laterality Date  . balloon atherotomy     LAD  . CARDIAC CATHETERIZATION  08/11/11   EF50-55%, small AAA, no signifiant stent restenosis in RCA , no restenosis of PTCA of LAD, medical therapy  . CARDIAC CATHETERIZATION  09/27/06   cutting balloon arthrotomy of LAD stenosis with dilatation of a 3.5x34mm cutting balloon  . CARDIAC CATHETERIZATION  01/07/09   50-60% progressive dz beyond the stented segment in the RCA , LAD intervention remained patent  . CORONARY ANGIOPLASTY WITH STENT PLACEMENT  09/24/06   PTCA/stent of RCA with a 3.5x1mm Taxus DES post dilated to 3.27mm  . CORONARY STENT PLACEMENT  09/2007   right  . ESOPHAGEAL MANOMETRY N/A 08/04/2013   Procedure: ESOPHAGEAL MANOMETRY (EM);  Surgeon: Theda Belfast, MD;  Location: [facility] ENDOSCOPY;  Service: Endoscopy;  Laterality: N/A;  . LEFT HEART CATHETERIZATION WITH CORONARY ANGIOGRAM N/A 08/11/2011   Procedure: LEFT HEART CATHETERIZATION WITH CORONARY ANGIOGRAM;  Surgeon: Lennette Bihari, MD;  Location: [facility] CATH LAB;  Service: Cardiovascular;  Laterality: N/A;   Social History:  reports that he quit smoking about 6 years ago. His smoking use included Cigarettes. He has a  90.00 pack-year smoking history. He has never used smokeless tobacco. He reports that he does not drink alcohol or use drugs.  Allergies  Allergen Reactions  . Niaspan [Niacin] Rash  . Septra [Sulfamethoxazole-Trimethoprim] Rash  . Simcor [Niacin-Simvastatin Er] Rash  . Zocor [Simvastatin] Rash    Family History  Problem Relation Age of Onset  . Emphysema Mother   . Heart disease Father   . Mesothelioma Father   . Heart attack Father      Prior to Admission medications   Medication Sig Start Date End Date Taking? Authorizing Provider  amLODipine (NORVASC) 5 MG tablet TAKE 1 TABLET (5 MG  TOTAL) BY MOUTH EVERY MORNING. 08/22/16  Yes Lennette Bihari, MD  aspirin EC 81 MG tablet Take 81 mg by mouth daily.     Yes Historical Provider, MD  clopidogrel (PLAVIX) 75 MG tablet TAKE 1 TABLET (75 MG TOTAL) BY MOUTH DAILY WITH BREAKFAST. 08/22/16  Yes Lennette Bihari, MD  hydrochlorothiazide (MICROZIDE) 12.5 MG capsule Take 1 capsule (12.5 mg total) by mouth every other day. 03/27/16  Yes Lennette Bihari, MD  isosorbide mononitrate (IMDUR) 60 MG 24 hr tablet TAKE 1.5 TABLETS BY MOUTH DAILY 08/22/16  Yes Lennette Bihari, MD  losartan (COZAAR) 50 MG tablet Take 1 tablet (50 mg total) by mouth daily. 09/22/16  Yes Lennette Bihari, MD  methocarbamol (ROBAXIN) 500 MG tablet Take 500 mg by mouth 4 (four) times daily as needed for muscle spasms. 09/26/16  Yes Historical Provider, MD  metoprolol succinate (TOPROL-XL) 25 MG 24 hr tablet Take 25 mg by mouth daily.   Yes Historical Provider, MD  Omega-3 Fatty Acids (FISH OIL) 1200 MG CAPS Take 1,200 mg by mouth 2 (two) times daily.    Yes Historical Provider, MD  ondansetron (ZOFRAN) 4 MG tablet Take 4 mg by mouth daily as needed for nausea/vomiting. 09/26/16  Yes Historical Provider, MD  oxyCODONE-acetaminophen (PERCOCET/ROXICET) 5-325 MG tablet Take 1-2 tablets by mouth every 6 (six) hours as needed for pain. 09/26/16  Yes Historical Provider, MD  SYNTHROID 200 MCG tablet Take 200 mcg by mouth every morning. 08/19/16  Yes Historical Provider, MD  umeclidinium-vilanterol (ANORO ELLIPTA) 62.5-25 MCG/INH AEPB INHALE 1 PUFF INTO THE LUNGS DAILY. 08/29/16  Yes Tammy S Parrett, NP  cyclobenzaprine (FLEXERIL) 10 MG tablet Take 10 mg by mouth 2 (two) times daily as needed for muscle spasms.  08/31/14   Historical Provider, MD  HYDROcodone-acetaminophen (NORCO) 7.5-325 MG tablet Take 1 tablet by mouth 3 (three) times daily as needed for pain. 09/24/16   Historical Provider, MD  levothyroxine (SYNTHROID) 125 MCG tablet Take 1 tablet (125 mcg total) by mouth daily before  breakfast. Patient not taking: Reported on 09/28/2016 09/14/16   Lennette Bihari, MD   Physical Exam: Vitals:   09/28/16 1018 09/28/16 1115 09/28/16 1312 09/28/16 1400  BP: (!) 150/68 (!) 151/71 (!) 146/83 129/66  Pulse: 68 61 80 86  Resp: 15 17    Temp:      TempSrc:      SpO2: 97% 98% 96% 95%  Weight:      Height:        Wt Readings from Last 3 Encounters:  09/28/16 95.3 kg (210 lb)  08/30/16 95.1 kg (209 lb 9.6 oz)  08/29/16 95.3 kg (210 lb)    General:  Appears calm and comfortable, A&Ox3 Eyes:  PERRL, EOMI, normal lids, iris ENT:  grossly normal hearing, lips & tongue Neck:  no LAD,  masses or thyromegaly Cardiovascular:  RRR, no m/r/g. No LE edema.  Respiratory:  CTA bilaterally, no w/r/r. Normal respiratory effort. Abdomen:  soft, ntnd Skin:  no rash or induration seen on limited exam Musculoskeletal:  grossly normal tone BUE/BLE Psychiatric:  grossly normal mood and affect, speech fluent and appropriate Neurologic:  CN 2-12 grossly intact, moves all extremities in coordinated fashion.          Labs on Admission:  Basic Metabolic Panel:  Recent Labs Lab 09/28/16 0959  NA 142  K 3.5  CL 101  GLUCOSE 92  BUN 15  CREATININE 0.90   Liver Function Tests: No results for input(s): AST, ALT, ALKPHOS, BILITOT, PROT, ALBUMIN in the last 168 hours. No results for input(s): LIPASE, AMYLASE in the last 168 hours. No results for input(s): AMMONIA in the last 168 hours. CBC:  Recent Labs Lab 09/28/16 0920 09/28/16 0959  WBC 9.9  --   NEUTROABS 7.2  --   HGB 14.7 15.6  HCT 44.8 46.0  MCV 86.2  --   PLT 282  --    Cardiac Enzymes: No results for input(s): CKTOTAL, CKMB, CKMBINDEX, TROPONINI in the last 168 hours.  BNP (last 3 results)  Recent Labs  09/28/16 0920  BNP 185.2*    ProBNP (last 3 results) No results for input(s): PROBNP in the last 8760 hours.   Serum creatinine: 0.9 mg/dL 16/10/96 0454 Estimated creatinine clearance: 90.4  mL/min  CBG: No results for input(s): GLUCAP in the last 168 hours.  Radiological Exams on Admission: Dg Chest 2 View  Result Date: 09/28/2016 CLINICAL DATA:  Shortness of breath. EXAM: CHEST  2 VIEW COMPARISON:  08/29/2016 FINDINGS: Heart and mediastinal contours are within normal limits. No focal opacities or effusions. No acute bony abnormality. IMPRESSION: No active cardiopulmonary disease. Electronically Signed   By: Charlett Nose M.D.   On: 09/28/2016 10:16   Ct Angio Chest Pe W And/or Wo Contrast  Result Date: 09/28/2016 CLINICAL DATA:  Shortness of breath. Left-sided chest pain. Recent right shoulder surgery. EXAM: CT ANGIOGRAPHY CHEST WITH CONTRAST TECHNIQUE: Multidetector CT imaging of the chest was performed using the standard protocol during bolus administration of intravenous contrast. Multiplanar CT image reconstructions and MIPs were obtained to evaluate the vascular anatomy. CONTRAST:  100 mL Isovue 370 COMPARISON:  None. FINDINGS: Cardiovascular: Negative for pulmonary embolism. Atherosclerotic calcifications at the aortic arch. Caliber of the thoracic aorta is within normal limits. Atherosclerotic calcification involving the descending thoracic aorta. Mediastinum/Nodes: Small lymph nodes throughout the mediastinum. Overall, no significant lymph node enlargement in the chest. Mild soft tissue edema in the right upper chest probably related to recent shoulder surgery. No significant pericardial fluid. Lungs/Pleura: Trachea and mainstem bronchi are patent. Small nodular densities along the right minor fissure best seen on the sagittal reformats, image 34, sequence 9. Largest nodule measures 6 mm in this area. Small amount of dependent atelectasis in the left lower lobe. No large areas of consolidation or airspace disease. Probable scarring along the posterolateral right chest related to old right rib fractures. Upper Abdomen: No acute abnormality in upper abdomen. Musculoskeletal: Bridging  osteophytes in the lower thoracic spine. Multiple old right rib fractures along the posterior and lateral aspect. Review of the MIP images confirms the above findings. IMPRESSION: Negative for a pulmonary embolism. Few patchy and nodular densities along the right minor fissure are nonspecific. There is a nodular component measuring up to 6 mm. Non-contrast chest CT at 6-12 months is recommended. If the  nodule is stable at time of repeat CT, then future CT at 18-24 months (from today's scan) is considered optional for low-risk patients, but is recommended for high-risk patients. This recommendation follows the consensus statement: Guidelines for Management of Incidental Pulmonary Nodules Detected on CT Images: From the Fleischner Society 2017; Radiology 2017; 284:228-243. Old right rib fractures with adjacent scarring. Electronically Signed   By: Richarda Overlie M.D.   On: 09/28/2016 13:06    EKG: Independently reviewed. No STEMI  Assessment/Plan Principal Problem:   Chest pain Active Problems:   Hypothyroid   COPD (chronic obstructive pulmonary disease) (HCC)   Post-op pain   CP/Hx of MI/Pos trop - serial trop ordered, initial neg and 2nd pos - prn EKG CP - prn moprhine CP - prn ntg cp - asa in ED and QD - ECHO & stress ordered for AM - tele bed, cardiac monitoring - ambien for sleep prn - zofran prn for nausea - cont plavix Cardio consult placed, called and awaiting call back   Chronic pain/Shoulder surgery pain Hold  Robaxin Cont percocet  Hypothyroidism Cont OP synthroid 200 mcg qd No signs of hyper or hypothyroidism  Hypertension When necessary hydralazine 10 mg IV as needed for severe blood pressure Cont cozaar, toprol- XL, norvasc, hctz  Lung Dz Cont Anoro qd    Code Status: FULL  DVT Prophylaxis: heparin Family Communication: wife at bedside Disposition Plan: Pending Improvement  Status: obs tele  Haydee Salter, MD [facility] [email or web address] Password [email or web address]

## 2016-09-28 NOTE — Progress Notes (Signed)
Call received from lab, troponin 0.04.  Message sent to attending.  Pt denies chest pain.  Await further orders.

## 2016-09-28 NOTE — Progress Notes (Signed)
Pt arrived to unit from ED.  Telemetry applied and CCMD notified x 2.  Pt and wife oriented to room including call light and telephone.  Plan of care discussed with Pt and wife.  All questions answered.  Pt denies chest pain.  Will cont to monitor.

## 2016-09-28 NOTE — ED Provider Notes (Signed)
[facility]-EMERGENCY DEPT Provider Note   CSN: [id number] Arrival date & time: 09/28/16  4782     History   Chief Complaint Chief Complaint  Patient presents with  . Chest Pain  . Shortness of Breath    HPI Matthew Evans is a 66 y.o. male.  HPI Patient presents with shortness of breath and chest pain. Woke up around 6:30 this morning with it. He's been having more shortness of breath while laying down. Was poorly diaphoretic and pale. On Monday he had shoulder surgery. States he has had swelling in his legs which is new to him. Had a slight left-sided chest pain. States he has a previous heart attack but this did not feel like that. Feeling better now. States his breathing is improved. States his arm is doing well. States he was sweaty however with his previous heart attack.   Past Medical History:  Diagnosis Date  . AAA (abdominal aortic aneurysm) (HCC)    mild; doppler 08/29/12-3.4x3.7  . Arthritis   . CAD (coronary artery disease)    echo 07/26/12-EF 55-60%; myoview 07/26/12-low normal wall motion with mild apical hypokinesis as well a septal wall motion consistent with LBBB  . Carotid artery occlusion   . Emphysema   . GERD (gastroesophageal reflux disease)   . H/O hiatal hernia   . Headache(784.0)   . History of palpitations   . HTN (hypertension)   . Hyperlipidemia   . Hypothyroidism    HX OF RADIOACTIVE IODINE   . Lupus    [name], [name]  . Memory loss    d/t mini stroke  . Mini stroke (HCC)   . Myocardial infarction (HCC) 09/2007   PCI to RCA  . Shortness of breath    WITH EXERTION  . Sleep apnea    sleep study 12/14/06 ahi 16.89, during rem 12.3, supine ahi 57.49; cpap 02/22/07-c-flex of 3 at 13cm h2o; PT STATES HE DOES NOT USE HIS CPAP EVERY NIGHT  . Stroke [facility])    PT STATES NEUROLOGIST TOLD HIM HE HAD HAD MINI STROKE  . Thyroid disease    hypothyroidism    Patient Active Problem List   Diagnosis Date Noted  . Chest pain 09/28/2016  . Preoperative clearance 08/29/2016  . Left bundle branch block 11/23/2014  . Status post Nissen fundoplication 10/09/2013  . CAD (coronary artery disease) 10/29/2012  . AAA (abdominal aortic aneurysm) (HCC) 10/29/2012  . Hypertensive heart disease 10/29/2012  . Hypothyroid 10/29/2012  . Hyperlipidemia with target LDL less than 70 10/29/2012  . COPD (chronic obstructive pulmonary disease) (HCC) 10/29/2012  . 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  . DOE (dyspnea on exertion) 09/09/2012    Past Surgical History:  Procedure Laterality Date  . balloon atherotomy     LAD  . CARDIAC CATHETERIZATION  08/11/11   EF50-55%, small AAA, no signifiant stent restenosis in RCA , no restenosis of PTCA of LAD, medical therapy  . CARDIAC CATHETERIZATION  09/27/06   cutting balloon arthrotomy of LAD stenosis with dilatation of a 3.5x85mm cutting balloon  . CARDIAC CATHETERIZATION  01/07/09   50-60% progressive dz beyond the stented segment in the RCA , LAD intervention remained patent  . CORONARY ANGIOPLASTY WITH STENT PLACEMENT  09/24/06   PTCA/stent of RCA with a 3.5x81mm Taxus DES post dilated to 3.74mm  . CORONARY STENT PLACEMENT  09/2007   right  . ESOPHAGEAL MANOMETRY N/A 08/04/2013   Procedure:  ESOPHAGEAL MANOMETRY (EM);  Surgeon: Theda Belfast, MD;  Location: [facility] ENDOSCOPY;  Service: Endoscopy;  Laterality: N/A;  . LEFT HEART CATHETERIZATION WITH CORONARY ANGIOGRAM N/A 08/11/2011   Procedure: LEFT HEART CATHETERIZATION WITH CORONARY ANGIOGRAM;  Surgeon: Lennette Bihari, MD;  Location: [facility] C[facility] LAB;  Service: Cardiovascular;  Laterality: N/A;       Home Medications    Prior to Admission medications   Medication Sig Start Date End Date Taking? Authorizing Provider  amLODipine (NORVASC) 5 MG tablet TAKE 1 TABLET (5 MG TOTAL) BY MOUTH EVERY MORNING. 08/22/16  Yes Lennette Bihari, MD  aspirin EC 81 MG tablet Take 81 mg by mouth daily.     Yes Historical  Provider, MD  clopidogrel (PLAVIX) 75 MG tablet TAKE 1 TABLET (75 MG TOTAL) BY MOUTH DAILY WITH BREAKFAST. 08/22/16  Yes Lennette Bihari, MD  hydrochlorothiazide (MICROZIDE) 12.5 MG capsule Take 1 capsule (12.5 mg total) by mouth every other day. 03/27/16  Yes Lennette Bihari, MD  isosorbide mononitrate (IMDUR) 60 MG 24 hr tablet TAKE 1.5 TABLETS BY MOUTH DAILY 08/22/16  Yes Lennette Bihari, MD  losartan (COZAAR) 50 MG tablet Take 1 tablet (50 mg total) by mouth daily. 09/22/16  Yes Lennette Bihari, MD  methocarbamol (ROBAXIN) 500 MG tablet Take 500 mg by mouth 4 (four) times daily as needed for muscle spasms. 09/26/16  Yes Historical Provider, MD  metoprolol succinate (TOPROL-XL) 25 MG 24 hr tablet Take 25 mg by mouth daily.   Yes Historical Provider, MD  Omega-3 Fatty Acids (FISH OIL) 1200 MG CAPS Take 1,200 mg by mouth 2 (two) times daily.    Yes Historical Provider, MD  ondansetron (ZOFRAN) 4 MG tablet Take 4 mg by mouth daily as needed for nausea/vomiting. 09/26/16  Yes Historical Provider, MD  oxyCODONE-acetaminophen (PERCOCET/ROXICET) 5-325 MG tablet Take 1-2 tablets by mouth every 6 (six) hours as needed for pain. 09/26/16  Yes Historical Provider, MD  SYNTHROID 200 MCG tablet Take 200 mcg by mouth every morning. 08/19/16  Yes Historical Provider, MD  umeclidinium-vilanterol (ANORO ELLIPTA) 62.5-25 MCG/INH AEPB INHALE 1 PUFF INTO THE LUNGS DAILY. 08/29/16  Yes Tammy S Parrett, NP  cyclobenzaprine (FLEXERIL) 10 MG tablet Take 10 mg by mouth 2 (two) times daily as needed for muscle spasms.  08/31/14   Historical Provider, MD  HYDROcodone-acetaminophen (NORCO) 7.5-325 MG tablet Take 1 tablet by mouth 3 (three) times daily as needed for pain. 09/24/16   Historical Provider, MD    Family History Family History  Problem Relation Age of Onset  . Emphysema Mother   . Heart disease Father   . Mesothelioma Father   . Heart attack Father     Social History Social History  Substance Use Topics  . Smoking  status: Former Smoker    Packs/day: 2.00    Years: 45.00    Types: Cigarettes    Quit date: 02/04/2010  . Smokeless tobacco: Never Used  . Alcohol use No     Comment: QUIT 1994     Allergies   Niaspan [niacin]; Septra [sulfamethoxazole-trimethoprim]; Simcor [niacin-simvastatin er]; and Zocor [simvastatin]   Review of Systems Review of Systems  Constitutional: Positive for diaphoresis. Negative for appetite change.  HENT: Negative for congestion.   Respiratory: Positive for shortness of breath.   Cardiovascular: Positive for chest pain.  Gastrointestinal: Negative for abdominal pain.  Genitourinary: Negative for flank pain.  Musculoskeletal: Negative for back pain.  Neurological: Negative for seizures.  Hematological: Negative for  adenopathy.  Psychiatric/Behavioral: Negative for confusion.     Physical Exam Updated Vital Signs BP (!) 157/57   Pulse 61   Temp 97.6 F (36.4 C)   Resp 18   Ht  (1.727 m)   Wt 212 lb 8 oz (96.4 kg)   SpO2 98%   BMI 32.31 kg/m   Physical Exam  Constitutional: He appears well-developed.  HENT:  Head: Atraumatic.  Eyes: Pupils are equal, round, and reactive to light.  Neck: No JVD present.  Cardiovascular: Normal rate.   Pulmonary/Chest: Effort normal.  Few rales at the bases.  Abdominal: There is no tenderness.  Musculoskeletal: He exhibits edema.  Pitting edema of bilateral lower legs.  Skin: Skin is warm. Capillary refill takes less than 2 seconds. No pallor.     ED Treatments / Results  Labs (all labs ordered are listed, but only abnormal results are displayed) Labs Reviewed  BRAIN NATRIURETIC PEPTIDE - Abnormal; Notable for the following:       Result Value   B Natriuretic Peptide 185.2 (*)    All other components within normal limits  TROPONIN I - Abnormal; Notable for the following:    Troponin I 0.04 (*)    All other components within normal limits  I-STAT CHEM 8, ED - Abnormal; Notable for the following:     Calcium, Ion 1.14 (*)    All other components within normal limits  CBC WITH DIFFERENTIAL/PLATELET  CBC  CREATININE, SERUM  TROPONIN I  TROPONIN I  I-STAT TROPOININ, ED    EKG  EKG Interpretation  Date/Time:  Thursday September 28 2016 09:17:40 EDT Ventricular Rate:  67 PR Interval:    QRS Duration: 153 QT Interval:  466 QTC Calculation: 492 R Axis:   128 Text Interpretation:  Sinus rhythm with artifact Nonspecific intraventricular conduction delay Consider anterolateral infarct Confirmed by Rubin Payor  MD, Aviv Rota [phone]) on 09/28/2016 9:22:06 AM       Radiology Dg Chest 2 View  Result Date: 09/28/2016 CLINICAL DATA:  Shortness of breath. EXAM: CHEST  2 VIEW COMPARISON:  08/29/2016 FINDINGS: Heart and mediastinal contours are within normal limits. No focal opacities or effusions. No acute bony abnormality. IMPRESSION: No active cardiopulmonary disease. Electronically Signed   By: Charlett Nose M.D.   On: 09/28/2016 10:16   Ct Angio Chest Pe W And/or Wo Contrast  Result Date: 09/28/2016 CLINICAL DATA:  Shortness of breath. Left-sided chest pain. Recent right shoulder surgery. EXAM: CT ANGIOGRAPHY CHEST WITH CONTRAST TECHNIQUE: Multidetector CT imaging of the chest was performed using the standard protocol during bolus administration of intravenous contrast. Multiplanar CT image reconstructions and MIPs were obtained to evaluate the vascular anatomy. CONTRAST:  100 mL Isovue 370 COMPARISON:  None. FINDINGS: Cardiovascular: Negative for pulmonary embolism. Atherosclerotic calcifications at the aortic arch. Caliber of the thoracic aorta is within normal limits. Atherosclerotic calcification involving the descending thoracic aorta. Mediastinum/Nodes: Small lymph nodes throughout the mediastinum. Overall, no significant lymph node enlargement in the chest. Mild soft tissue edema in the right upper chest probably related to recent shoulder surgery. No significant pericardial fluid. Lungs/Pleura:  Trachea and mainstem bronchi are patent. Small nodular densities along the right minor fissure best seen on the sagittal reformats, image 34, sequence 9. Largest nodule measures 6 mm in this area. Small amount of dependent atelectasis in the left lower lobe. No large areas of consolidation or airspace disease. Probable scarring along the posterolateral right chest related to old right rib fractures. Upper Abdomen: No acute  abnormality in upper abdomen. Musculoskeletal: Bridging osteophytes in the lower thoracic spine. Multiple old right rib fractures along the posterior and lateral aspect. Review of the MIP images confirms the above findings. IMPRESSION: Negative for a pulmonary embolism. Few patchy and nodular densities along the right minor fissure are nonspecific. There is a nodular component measuring up to 6 mm. Non-contrast chest CT at 6-12 months is recommended. If the nodule is stable at time of repeat CT, then future CT at 18-24 months (from today's scan) is considered optional for low-risk patients, but is recommended for high-risk patients. This recommendation follows the consensus statement: Guidelines for Management of Incidental Pulmonary Nodules Detected on CT Images: From the Fleischner Society 2017; Radiology 2017; 284:228-243. Old right rib fractures with adjacent scarring. Electronically Signed   By: Richarda Overlie M.D.   On: 09/28/2016 13:06    Procedures Procedures (including critical care time)  Medications Ordered in ED Medications  levothyroxine (SYNTHROID, LEVOTHROID) tablet 200 mcg (200 mcg Oral Given 09/28/16 1539)  losartan (COZAAR) tablet 50 mg (not administered)  metoprolol succinate (TOPROL-XL) 24 hr tablet 25 mg (not administered)  amLODipine (NORVASC) tablet 5 mg (5 mg Oral Given 09/28/16 1539)  umeclidinium-vilanterol (ANORO ELLIPTA) 62.5-25 MCG/INH 1 puff (1 puff Inhalation Not Given 09/28/16 1530)  clopidogrel (PLAVIX) tablet 75 mg (75 mg Oral Given 09/28/16 1539)    isosorbide mononitrate (IMDUR) 24 hr tablet 90 mg (not administered)  hydrochlorothiazide (MICROZIDE) capsule 12.5 mg (not administered)  aspirin EC tablet 325 mg (not administered)  acetaminophen (TYLENOL) tablet 650 mg (not administered)  ondansetron (ZOFRAN) injection 4 mg (not administered)  heparin injection 5,000 Units (5,000 Units Subcutaneous Given 09/28/16 1540)  morphine 4 MG/ML injection 2 mg (not administered)  zolpidem (AMBIEN) tablet 5 mg (not administered)  hydrALAZINE (APRESOLINE) injection 10 mg (not administered)  iopamidol (ISOVUE-370) 76 % injection (100 mLs  Contrast Given 09/28/16 1239)  oxyCODONE-acetaminophen (PERCOCET/ROXICET) 5-325 MG per tablet 2 tablet (2 tablets Oral Given 09/28/16 1318)     Initial Impression / Assessment and Plan / ED Course  I have reviewed the triage vital signs and the nursing notes.  Pertinent labs & imaging results that were available during my care of the patient were reviewed by me and considered in my medical decision making (see chart for details).     Patient with chest pain and shortness of breath. Recent shoulder surgery. Was diaphoretic. CTA negative for clot. With cardiac history will admit for further rule out.  Final Clinical Impressions(s) / ED Diagnoses   Final diagnoses:  Chest pain, unspecified type    New Prescriptions Current Discharge Medication List       Benjiman Core, MD 09/28/16 [phone]

## 2016-09-29 ENCOUNTER — Observation Stay (HOSPITAL_BASED_OUTPATIENT_CLINIC_OR_DEPARTMENT_OTHER): Payer: BLUE CROSS/BLUE SHIELD

## 2016-09-29 ENCOUNTER — Observation Stay (HOSPITAL_COMMUNITY): Payer: BLUE CROSS/BLUE SHIELD

## 2016-09-29 ENCOUNTER — Encounter (HOSPITAL_COMMUNITY): Payer: BLUE CROSS/BLUE SHIELD

## 2016-09-29 DIAGNOSIS — R079 Chest pain, unspecified: Secondary | ICD-10-CM

## 2016-09-29 DIAGNOSIS — R7989 Other specified abnormal findings of blood chemistry: Secondary | ICD-10-CM

## 2016-09-29 DIAGNOSIS — R0600 Dyspnea, unspecified: Secondary | ICD-10-CM

## 2016-09-29 DIAGNOSIS — J430 Unilateral pulmonary emphysema [MacLeod's syndrome]: Secondary | ICD-10-CM | POA: Diagnosis not present

## 2016-09-29 DIAGNOSIS — R0789 Other chest pain: Secondary | ICD-10-CM

## 2016-09-29 DIAGNOSIS — I200 Unstable angina: Secondary | ICD-10-CM

## 2016-09-29 DIAGNOSIS — J410 Simple chronic bronchitis: Secondary | ICD-10-CM | POA: Diagnosis not present

## 2016-09-29 DIAGNOSIS — R778 Other specified abnormalities of plasma proteins: Secondary | ICD-10-CM

## 2016-09-29 DIAGNOSIS — R748 Abnormal levels of other serum enzymes: Secondary | ICD-10-CM | POA: Diagnosis not present

## 2016-09-29 DIAGNOSIS — E032 Hypothyroidism due to medicaments and other exogenous substances: Secondary | ICD-10-CM | POA: Diagnosis not present

## 2016-09-29 LAB — ECHOCARDIOGRAM COMPLETE
HEIGHTINCHES: 68 in
Weight: 3400 oz

## 2016-09-29 LAB — NM MYOCAR MULTI W/SPECT W/WALL MOTION / EF
CSEPED: 5 min
CSEPPHR: 101 {beats}/min
Exercise duration (sec): 18 s
Rest HR: 69 {beats}/min

## 2016-09-29 MED ORDER — TECHNETIUM TC 99M MEBROFENIN IV KIT: 5.0000 | PACK | Freq: Once | INTRAVENOUS | Status: DC | PRN

## 2016-09-29 MED ORDER — TECHNETIUM TC 99M TETROFOSMIN IV KIT
30.0000 | PACK | Freq: Once | INTRAVENOUS | Status: AC | PRN
  Administered 2016-09-29: 30 via INTRAVENOUS

## 2016-09-29 MED ORDER — REGADENOSON 0.4 MG/5ML IV SOLN
0.4000 mg | Freq: Once | INTRAVENOUS | Status: AC
  Administered 2016-09-29: 0.4 mg via INTRAVENOUS
  Filled 2016-09-29: qty 5

## 2016-09-29 MED ORDER — REGADENOSON 0.4 MG/5ML IV SOLN
INTRAVENOUS | Status: AC
  Filled 2016-09-29: qty 5

## 2016-09-29 MED ORDER — FUROSEMIDE 10 MG/ML IJ SOLN
20.0000 mg | Freq: Once | INTRAMUSCULAR | Status: AC
  Administered 2016-09-29: 20 mg via INTRAVENOUS
  Filled 2016-09-29: qty 2

## 2016-09-29 MED ORDER — PANTOPRAZOLE SODIUM 40 MG PO TBEC: 40.0000 mg | DELAYED_RELEASE_TABLET | Freq: Every day | ORAL | 1 refills | Status: DC

## 2016-09-29 MED ORDER — TECHNETIUM TC 99M TETROFOSMIN IV KIT
10.0000 | PACK | Freq: Once | INTRAVENOUS | Status: AC | PRN
  Administered 2016-09-29: 10 via INTRAVENOUS

## 2016-09-29 NOTE — Progress Notes (Signed)
Received results from Pt stress test.  Paged cards consult and paged attending.  Will cont to monitor.

## 2016-09-29 NOTE — Progress Notes (Signed)
    I have discussed abnormal nuclear stress test and cath with patient. He is willing to proceed.   I have reviewed the risks, indications, and alternatives to cardiac catheterization and possible angioplasty/stenting with the patient. Risks include but are not limited to bleeding, infection, vascular injury, stroke, myocardial infection, arrhythmia, kidney injury, radiation-related injury in the case of prolonged fluoroscopy use, emergency cardiac surgery, and death. The patient understands the risks of serious complication is low (<1%).   Still complaining of orthopnea. Suspect post op fluid overload (shoulder surgery last week). He has some LE edema that has improved some. Will give one more dose of IV la [id number]  to see if that helps breathing.  Cline Crock PA-C  [facility]

## 2016-09-29 NOTE — Progress Notes (Signed)
[facility] PROGRESS NOTE  Matthew Evans VWU:[id number] DOB: May 29, 1951 DOA: 09/28/2016   PCP: Michiel Sites, MD     Assessment/Plan: Principal Problem:   Chest pain Active Problems:   Hypothyroid   COPD (chronic obstructive pulmonary disease) (HCC)   Post-op pain   Elevated troponin    66 y.o. male who is being seen today for the evaluation of NSTEMI at the request of Dr. Melynda Ripple. [name] has a history of coronary artery disease and history of ST elevation MI in 2009 was to the RCA. As his coronary disease in the LAD and high-grade LAD stenosis and underwent cutting balloon angioplasty. He since had a number of other cardiac catheterization showing mild to moderate coronary disease. found to have an EF of 40-45% with diffuse hypokinesis on echo,cardiology consulted ,had a stress test which was abnormal  Assessment/Plan CP/Hx of MI/Pos trop - serial trop flat  - asa /heparin gtt? - tele bed, cardiac monitoring - ambien for sleep prn - zofran prn for nausea - cont plavix  stress test abnl, showed reversible ischemia , Cath monday   Chronic pain/Shoulder surgery pain Hold  Robaxin Cont percocet  Hypothyroidism Cont OP synthroid 200 mcg qd No signs of hyper or hypothyroidism  Hypertension When necessary hydralazine 10 mg IV as needed for severe blood pressure Cont cozaar, toprol- XL, norvasc, hctz  Lung Dz Cont Anoro qd     DVT prophylaxsis heparin  Code Status:  Full code     Family Communication: Discussed in detail with the patient, all imaging results, lab results explained to the patient   Disposition Plan:   Cath monday      Consultants:  cardiology  Procedures:   none   Antibiotics: Anti-infectives    None         HPI/Subjective: Chest pain free  Objective: Vitals:   09/29/16 0937 09/29/16 0939 09/29/16 0941 09/29/16 1321  BP: (!) 166/72 (!) 148/70 (!) 149/64 (!) 147/68  Pulse: 99 71 87 63  Resp:    20   Temp:    97.6 F (36.4 C)  TempSrc:    Oral  SpO2:    97%  Weight:      Height:        Intake/Output Summary (Last 24 hours) at 09/29/16 1501 Last data filed at 09/29/16 1300  Gross per 24 hour  Intake              720 ml  Output                0 ml  Net              720 ml    Exam:  Examination:  General exam: Appears calm and comfortable  Respiratory system: Clear to auscultation. Respiratory effort normal. Cardiovascular system: S1 & S2 heard, RRR. No JVD, murmurs, rubs, gallops or clicks. No pedal edema. Gastrointestinal system: Abdomen is nondistended, soft and nontender. No organomegaly or masses felt. Normal bowel sounds heard. Central nervous system: Alert and oriented. No focal neurological deficits. Extremities: Symmetric 5 x 5 power. Skin: No rashes, lesions or ulcers Psychiatry: Judgement and insight appear normal. Mood & affect appropriate.     Data Reviewed: I have personally reviewed following labs and imaging studies  Micro Results No results found for this or any previous visit (from the past 240 hour(s)).  Radiology Reports Dg Chest 2 View  Result Date: 09/28/2016 CLINICAL DATA:  Shortness of breath.  EXAM: CHEST  2 VIEW COMPARISON:  08/29/2016 FINDINGS: Heart and mediastinal contours are within normal limits. No focal opacities or effusions. No acute bony abnormality. IMPRESSION: No active cardiopulmonary disease. Electronically Signed   By: Charlett Nose M.D.   On: 09/28/2016 10:16   Ct Angio Chest Pe W And/or Wo Contrast  Result Date: 09/28/2016 CLINICAL DATA:  Shortness of breath. Left-sided chest pain. Recent right shoulder surgery. EXAM: CT ANGIOGRAPHY CHEST WITH CONTRAST TECHNIQUE: Multidetector CT imaging of the chest was performed using the standard protocol during bolus administration of intravenous contrast. Multiplanar CT image reconstructions and MIPs were obtained to evaluate the vascular anatomy. CONTRAST:  100 mL Isovue 370 COMPARISON:   None. FINDINGS: Cardiovascular: Negative for pulmonary embolism. Atherosclerotic calcifications at the aortic arch. Caliber of the thoracic aorta is within normal limits. Atherosclerotic calcification involving the descending thoracic aorta. Mediastinum/Nodes: Small lymph nodes throughout the mediastinum. Overall, no significant lymph node enlargement in the chest. Mild soft tissue edema in the right upper chest probably related to recent shoulder surgery. No significant pericardial fluid. Lungs/Pleura: Trachea and mainstem bronchi are patent. Small nodular densities along the right minor fissure best seen on the sagittal reformats, image 34, sequence 9. Largest nodule measures 6 mm in this area. Small amount of dependent atelectasis in the left lower lobe. No large areas of consolidation or airspace disease. Probable scarring along the posterolateral right chest related to old right rib fractures. Upper Abdomen: No acute abnormality in upper abdomen. Musculoskeletal: Bridging osteophytes in the lower thoracic spine. Multiple old right rib fractures along the posterior and lateral aspect. Review of the MIP images confirms the above findings. IMPRESSION: Negative for a pulmonary embolism. Few patchy and nodular densities along the right minor fissure are nonspecific. There is a nodular component measuring up to 6 mm. Non-contrast chest CT at 6-12 months is recommended. If the nodule is stable at time of repeat CT, then future CT at 18-24 months (from today's scan) is considered optional for low-risk patients, but is recommended for high-risk patients. This recommendation follows the consensus statement: Guidelines for Management of Incidental Pulmonary Nodules Detected on CT Images: From the Fleischner Society 2017; Radiology 2017; 284:228-243. Old right rib fractures with adjacent scarring. Electronically Signed   By: Richarda Overlie M.D.   On: 09/28/2016 13:06   Nm Myocar Multi W/spect W/wall Motion / Ef  Result  Date: 09/29/2016 CLINICAL DATA:  66 year old male with chest pain. History of myocardial infarction with coronary catheterization and angioplasty. EXAM: MYOCARDIAL IMAGING WITH SPECT (REST AND PHARMACOLOGIC-STRESS) GATED LEFT VENTRICULAR WALL MOTION STUDY LEFT VENTRICULAR EJECTION FRACTION TECHNIQUE: Standard myocardial SPECT imaging was performed after resting intravenous injection of 10 mCi Tc-41m tetrofosmin. Subsequently, intravenous infusion of Lexiscan was performed under the supervision of the Cardiology staff. At peak effect of the drug, 30 mCi Tc-71m tetrofosmin was injected intravenously and standard myocardial SPECT imaging was performed. Quantitative gated imaging was also performed to evaluate left ventricular wall motion, and estimate left ventricular ejection fraction. COMPARISON:  None. FINDINGS: Perfusion: Moderate region of mild decrease counts in the mid and apical segment anterior septal wall which partially reverses. Decreased Decrease counts within mid and basilar segment of the inferior wall which are moderate and partially reversed. Wall Motion: Normal left ventricular wall motion. No left ventricular dilation. Left Ventricular Ejection Fraction: 31 % End diastolic volume 141 ml End systolic volume 97 ml IMPRESSION: 1. Peri-infarct ischemia surrounding a moderate infarction in the anterior septal wall. Mild reversible ischemia in the  mid and basilar basilar segment of the inferior wall. 2. Septal hypokinesia.  Mild ventricular dilatation. 3. Left ventricular ejection fraction 31% 4. Non invasive risk stratification*: High: *2012 Appropriate Use Criteria for Coronary Revascularization Focused Update: J Am Coll Cardiol. 2012;59(9):857-881. http://content.[email or web address]?articleid=[email or web address] Electronically Signed   By: Genevive Bi M.D.   On: 09/29/2016 14:24     CBC  Recent Labs Lab 09/28/16 0920 09/28/16 0959 09/28/16 1452  WBC 9.9  --  8.6  HGB 14.7 15.6 13.9  HCT  44.8 46.0 42.1  PLT 282  --  282  MCV 86.2  --  84.7  MCH 28.3  --  28.0  MCHC 32.8  --  33.0  RDW 12.7  --  12.7  LYMPHSABS 1.4  --   --   MONOABS 0.9  --   --   EOSABS 0.4  --   --   BASOSABS 0.1  --   --     Chemistries   Recent Labs Lab 09/28/16 0959 09/28/16 1452  NA 142  --   K 3.5  --   CL 101  --   GLUCOSE 92  --   BUN 15  --   CREATININE 0.90 0.88   ------------------------------------------------------------------------------------------------------------------ estimated creatinine clearance is 93 mL/min (by C-G formula based on SCr of 0.88 mg/dL). ------------------------------------------------------------------------------------------------------------------ No results for input(s): HGBA1C in the last 72 hours. ------------------------------------------------------------------------------------------------------------------ No results for input(s): CHOL, HDL, LDLCALC, TRIG, CHOLHDL, LDLDIRECT in the last 72 hours. ------------------------------------------------------------------------------------------------------------------ No results for input(s): TSH, T4TOTAL, T3FREE, THYROIDAB in the last 72 hours.  Invalid input(s): FREET3 ------------------------------------------------------------------------------------------------------------------ No results for input(s): VITAMINB12, FOLATE, FERRITIN, TIBC, IRON, RETICCTPCT in the last 72 hours.  Coagulation profile No results for input(s): INR, PROTIME in the last 168 hours.  No results for input(s): DDIMER in the last 72 hours.  Cardiac Enzymes  Recent Labs Lab 09/28/16 1452 09/28/16 1811 09/28/16 2128  TROPONINI 0.04* 0.05* 0.05*   ------------------------------------------------------------------------------------------------------------------ Invalid input(s): POCBNP   CBG: No results for input(s): GLUCAP in the last 168 hours.     Studies: Dg Chest 2 View  Result Date: 09/28/2016 CLINICAL  DATA:  Shortness of breath. EXAM: CHEST  2 VIEW COMPARISON:  08/29/2016 FINDINGS: Heart and mediastinal contours are within normal limits. No focal opacities or effusions. No acute bony abnormality. IMPRESSION: No active cardiopulmonary disease. Electronically Signed   By: Charlett Nose M.D.   On: 09/28/2016 10:16   Ct Angio Chest Pe W And/or Wo Contrast  Result Date: 09/28/2016 CLINICAL DATA:  Shortness of breath. Left-sided chest pain. Recent right shoulder surgery. EXAM: CT ANGIOGRAPHY CHEST WITH CONTRAST TECHNIQUE: Multidetector CT imaging of the chest was performed using the standard protocol during bolus administration of intravenous contrast. Multiplanar CT image reconstructions and MIPs were obtained to evaluate the vascular anatomy. CONTRAST:  100 mL Isovue 370 COMPARISON:  None. FINDINGS: Cardiovascular: Negative for pulmonary embolism. Atherosclerotic calcifications at the aortic arch. Caliber of the thoracic aorta is within normal limits. Atherosclerotic calcification involving the descending thoracic aorta. Mediastinum/Nodes: Small lymph nodes throughout the mediastinum. Overall, no significant lymph node enlargement in the chest. Mild soft tissue edema in the right upper chest probably related to recent shoulder surgery. No significant pericardial fluid. Lungs/Pleura: Trachea and mainstem bronchi are patent. Small nodular densities along the right minor fissure best seen on the sagittal reformats, image 34, sequence 9. Largest nodule measures 6 mm in this area. Small amount of dependent atelectasis in the left lower lobe. No large areas of consolidation or  airspace disease. Probable scarring along the posterolateral right chest related to old right rib fractures. Upper Abdomen: No acute abnormality in upper abdomen. Musculoskeletal: Bridging osteophytes in the lower thoracic spine. Multiple old right rib fractures along the posterior and lateral aspect. Review of the MIP images confirms the above  findings. IMPRESSION: Negative for a pulmonary embolism. Few patchy and nodular densities along the right minor fissure are nonspecific. There is a nodular component measuring up to 6 mm. Non-contrast chest CT at 6-12 months is recommended. If the nodule is stable at time of repeat CT, then future CT at 18-24 months (from today's scan) is considered optional for low-risk patients, but is recommended for high-risk patients. This recommendation follows the consensus statement: Guidelines for Management of Incidental Pulmonary Nodules Detected on CT Images: From the Fleischner Society 2017; Radiology 2017; 284:228-243. Old right rib fractures with adjacent scarring. Electronically Signed   By: Richarda Overlie M.D.   On: 09/28/2016 13:06   Nm Myocar Multi W/spect W/wall Motion / Ef  Result Date: 09/29/2016 CLINICAL DATA:  66 year old male with chest pain. History of myocardial infarction with coronary catheterization and angioplasty. EXAM: MYOCARDIAL IMAGING WITH SPECT (REST AND PHARMACOLOGIC-STRESS) GATED LEFT VENTRICULAR WALL MOTION STUDY LEFT VENTRICULAR EJECTION FRACTION TECHNIQUE: Standard myocardial SPECT imaging was performed after resting intravenous injection of 10 mCi Tc-50m tetrofosmin. Subsequently, intravenous infusion of Lexiscan was performed under the supervision of the Cardiology staff. At peak effect of the drug, 30 mCi Tc-50m tetrofosmin was injected intravenously and standard myocardial SPECT imaging was performed. Quantitative gated imaging was also performed to evaluate left ventricular wall motion, and estimate left ventricular ejection fraction. COMPARISON:  None. FINDINGS: Perfusion: Moderate region of mild decrease counts in the mid and apical segment anterior septal wall which partially reverses. Decreased Decrease counts within mid and basilar segment of the inferior wall which are moderate and partially reversed. Wall Motion: Normal left ventricular wall motion. No left ventricular dilation.  Left Ventricular Ejection Fraction: 31 % End diastolic volume 141 ml End systolic volume 97 ml IMPRESSION: 1. Peri-infarct ischemia surrounding a moderate infarction in the anterior septal wall. Mild reversible ischemia in the mid and basilar basilar segment of the inferior wall. 2. Septal hypokinesia.  Mild ventricular dilatation. 3. Left ventricular ejection fraction 31% 4. Non invasive risk stratification*: High: *2012 Appropriate Use Criteria for Coronary Revascularization Focused Update: J Am Coll Cardiol. 2012;59(9):857-881. http://content.[email or web address]?[email or web address] Electronically Signed   By: Genevive Bi M.D.   On: 09/29/2016 14:24      No results found for: HGBA1C Lab Results  Component Value Date   LDLCALC 66 09/01/2016   CREATININE 0.88 09/28/2016       Scheduled Meds: . amLODipine  5 mg Oral Daily  . aspirin EC  325 mg Oral Daily  . clopidogrel  75 mg Oral Daily  . heparin  5,000 Units Subcutaneous Q8H  . isosorbide mononitrate  90 mg Oral Daily  . levothyroxine  200 mcg Oral QAC breakfast  . losartan  50 mg Oral Daily  . metoprolol succinate  25 mg Oral Daily  . regadenoson      . umeclidinium-vilanterol  1 puff Inhalation Daily   Continuous Infusions:   LOS: 0 days    Time spent: >30 MINS    Richarda Overlie  [facility] Pager [phone]. If 7PM-7AM, please contact night-coverage at [email or web address], password [facility] 09/29/2016, 3:01 PM  LOS: 0 days

## 2016-09-29 NOTE — Progress Notes (Signed)
  Echocardiogram 2D Echocardiogram has been performed.  Gayl Ivanoff T Truman Aceituno 09/29/2016, 12:32 PM

## 2016-09-29 NOTE — Progress Notes (Signed)
Patient presented for Lexiscan stress test. Tolerated procedure well. Pending final stress imaging result.  Berton Bon, AGNP-C 09/29/2016  9:48 AM Pager: [phone]

## 2016-09-29 NOTE — Progress Notes (Signed)
Reviewed echo and myoview results with Dr. Elease Hashimoto and Dr. Antoine Poche. Will keep patient through the weekend and plan for left heart catheterization on Monday, 10/02/16, at 1330 with either Dr. Katrinka Blazing or Dr. Herbie Baltimore. Pre-cath orders have not been placed. NPO at [facility] on Sunday night please.   Roe Rutherford Balbina Depace, PA-C 09/29/2016, 2:57 PM [phone]

## 2016-09-29 NOTE — Progress Notes (Signed)
Progress Note  Patient Name: Matthew Evans Date of Encounter: 09/29/2016  Primary Cardiologist: Dr. Tresa Endo  Subjective   Pt seen briefly in stress lab. No further chest pain. Occ mild shortness of breath.   Inpatient Medications    Scheduled Meds: . regadenoson      . amLODipine  5 mg Oral Daily  . aspirin EC  325 mg Oral Daily  . clopidogrel  75 mg Oral Daily  . heparin  5,000 Units Subcutaneous Q8H  . isosorbide mononitrate  90 mg Oral Daily  . levothyroxine  200 mcg Oral QAC breakfast  . losartan  50 mg Oral Daily  . metoprolol succinate  25 mg Oral Daily  . regadenoson  0.4 mg Intravenous Once  . umeclidinium-vilanterol  1 puff Inhalation Daily   Continuous Infusions:  PRN Meds: acetaminophen, hydrALAZINE, morphine injection, ondansetron (ZOFRAN) IV, oxyCODONE-acetaminophen, zolpidem   Vital Signs    Vitals:   09/28/16 2024 09/28/16 2220 09/29/16 0417 09/29/16 0910  BP: (!) 156/70 132/72 126/60 (!) 157/67  Pulse:  69 60   Resp:   17   Temp:   98.2 F (36.8 C)   TempSrc:   Oral   SpO2:   95%   Weight:      Height:        Intake/Output Summary (Last 24 hours) at 09/29/16 0927 Last data filed at 09/29/16 0800  Gross per 24 hour  Intake              480 ml  Output                0 ml  Net              480 ml   Filed Weights   09/28/16 0919 09/28/16 1511  Weight: 210 lb (95.3 kg) 212 lb 8 oz (96.4 kg)    Telemetry    NSR - Personally Reviewed  ECG    Sinus bradycardia 56 bpm, LBBB, LAD - Personally Reviewed  Physical Exam   GEN: No acute distress.   Neck: No JVD Cardiac: RRR, no murmurs, rubs, or gallops.  Respiratory: Clear to auscultation bilaterally. GI: Soft, nontender, non-distended  MS: No edema; Surgical dressing on right shoulder and right arm in sling. Neuro:  Nonfocal  Psych: Normal affect   Labs    Chemistry Recent Labs Lab 09/28/16 0959 09/28/16 1452  NA 142  --   K 3.5  --   CL 101  --   GLUCOSE 92  --   BUN 15  --     CREATININE 0.90 0.88  GFRNONAA  --  >60  GFRAA  --  >60     Hematology Recent Labs Lab 09/28/16 0920 09/28/16 0959 09/28/16 1452  WBC 9.9  --  8.6  RBC 5.20  --  4.97  HGB 14.7 15.6 13.9  HCT 44.8 46.0 42.1  MCV 86.2  --  84.7  MCH 28.3  --  28.0  MCHC 32.8  --  33.0  RDW 12.7  --  12.7  PLT 282  --  282    Cardiac Enzymes Recent Labs Lab 09/28/16 1452 09/28/16 1811 09/28/16 2128  TROPONINI 0.04* 0.05* 0.05*    Recent Labs Lab 09/28/16 0957  TROPIPOC 0.00     BNP Recent Labs Lab 09/28/16 0920  BNP 185.2*     DDimer No results for input(s): DDIMER in the last 168 hours.   Radiology    Dg Chest 2 View  Result Date: 09/28/2016 CLINICAL DATA:  Shortness of breath. EXAM: CHEST  2 VIEW COMPARISON:  08/29/2016 FINDINGS: Heart and mediastinal contours are within normal limits. No focal opacities or effusions. No acute bony abnormality. IMPRESSION: No active cardiopulmonary disease. Electronically Signed   By: Charlett Nose M.D.   On: 09/28/2016 10:16   Ct Angio Chest Pe W And/or Wo Contrast  Result Date: 09/28/2016 CLINICAL DATA:  Shortness of breath. Left-sided chest pain. Recent right shoulder surgery. EXAM: CT ANGIOGRAPHY CHEST WITH CONTRAST TECHNIQUE: Multidetector CT imaging of the chest was performed using the standard protocol during bolus administration of intravenous contrast. Multiplanar CT image reconstructions and MIPs were obtained to evaluate the vascular anatomy. CONTRAST:  100 mL Isovue 370 COMPARISON:  None. FINDINGS: Cardiovascular: Negative for pulmonary embolism. Atherosclerotic calcifications at the aortic arch. Caliber of the thoracic aorta is within normal limits. Atherosclerotic calcification involving the descending thoracic aorta. Mediastinum/Nodes: Small lymph nodes throughout the mediastinum. Overall, no significant lymph node enlargement in the chest. Mild soft tissue edema in the right upper chest probably related to recent shoulder surgery.  No significant pericardial fluid. Lungs/Pleura: Trachea and mainstem bronchi are patent. Small nodular densities along the right minor fissure best seen on the sagittal reformats, image 34, sequence 9. Largest nodule measures 6 mm in this area. Small amount of dependent atelectasis in the left lower lobe. No large areas of consolidation or airspace disease. Probable scarring along the posterolateral right chest related to old right rib fractures. Upper Abdomen: No acute abnormality in upper abdomen. Musculoskeletal: Bridging osteophytes in the lower thoracic spine. Multiple old right rib fractures along the posterior and lateral aspect. Review of the MIP images confirms the above findings. IMPRESSION: Negative for a pulmonary embolism. Few patchy and nodular densities along the right minor fissure are nonspecific. There is a nodular component measuring up to 6 mm. Non-contrast chest CT at 6-12 months is recommended. If the nodule is stable at time of repeat CT, then future CT at 18-24 months (from today's scan) is considered optional for low-risk patients, but is recommended for high-risk patients. This recommendation follows the consensus statement: Guidelines for Management of Incidental Pulmonary Nodules Detected on CT Images: From the Fleischner Society 2017; Radiology 2017; 284:228-243. Old right rib fractures with adjacent scarring. Electronically Signed   By: Richarda Overlie M.D.   On: 09/28/2016 13:06    Cardiac Studies   Stress test and echo pending.   Patient Profile     66 y.o. male a history of COPD, hypertension, hypothyroidism, hyperlipidemia, GERD and coronary artery disease and history of ST elevation MI with RCA stent 2009 and staged cutting balloon to a high grade LAD stenosis whitch involved multiple branches and stenting was not done to reduce potential for jailing of his multiple branches and stenting was not done to reduce potential for jailing of his multiple branches. 2010 he was found to  have 50-60% stenosis beyond his RCA stent in the LAD intervention site remained patent. He has a documented stable abdominal aortic aneurysm. Presented for increased shortness of breath and very brief chest pain. Right shoulder surgery was done Monday.  Assessment & Plan    Chest pain -Very brief chest pain. Pt more concerned for increased shortness of breath and orthopnea. Troponins very mildly elevated in flat pattern. CT scan in the ER was negative for PE.  -No chest pain overnight. Only occ mild shortness of breath. -Currently undergoing Lexiscan stress test. Echo pending.   Signed, Berton Bon, NP  09/29/2016, 9:27 AM

## 2016-09-30 ENCOUNTER — Other Ambulatory Visit: Payer: Self-pay | Admitting: Cardiovascular Disease

## 2016-09-30 ENCOUNTER — Observation Stay (HOSPITAL_BASED_OUTPATIENT_CLINIC_OR_DEPARTMENT_OTHER): Payer: BLUE CROSS/BLUE SHIELD

## 2016-09-30 DIAGNOSIS — J410 Simple chronic bronchitis: Secondary | ICD-10-CM | POA: Diagnosis not present

## 2016-09-30 DIAGNOSIS — M7989 Other specified soft tissue disorders: Secondary | ICD-10-CM | POA: Diagnosis not present

## 2016-09-30 DIAGNOSIS — R079 Chest pain, unspecified: Secondary | ICD-10-CM | POA: Diagnosis not present

## 2016-09-30 DIAGNOSIS — I200 Unstable angina: Secondary | ICD-10-CM | POA: Diagnosis not present

## 2016-09-30 LAB — COMPREHENSIVE METABOLIC PANEL
ALBUMIN: 3 g/dL — AB (ref 3.5–5.0)
ALT: 15 U/L — AB (ref 17–63)
AST: 24 U/L (ref 15–41)
Alkaline Phosphatase: 91 U/L (ref 38–126)
Anion gap: 9 (ref 5–15)
BUN: 15 mg/dL (ref 6–20)
CHLORIDE: 103 mmol/L (ref 101–111)
CO2: 26 mmol/L (ref 22–32)
Calcium: 8.5 mg/dL — ABNORMAL LOW (ref 8.9–10.3)
Creatinine, Ser: 0.98 mg/dL (ref 0.61–1.24)
GFR calc Af Amer: >60 mL/min (ref >60)
GLUCOSE: 98 mg/dL (ref 65–99)
POTASSIUM: 3.6 mmol/L (ref 3.5–5.1)
SODIUM: 138 mmol/L (ref 135–145)
Total Bilirubin: 0.8 mg/dL (ref 0.3–1.2)
Total Protein: 5.9 g/dL — ABNORMAL LOW (ref 6.5–8.1)

## 2016-09-30 LAB — CBC
HEMATOCRIT: 41.2 % (ref 39.0–52.0)
Hemoglobin: 13.3 g/dL (ref 13.0–17.0)
MCH: 27.4 pg (ref 26.0–34.0)
MCHC: 32.3 g/dL (ref 30.0–36.0)
MCV: 84.9 fL (ref 78.0–100.0)
PLATELETS: 292 10*3/uL (ref 150–400)
RBC: 4.85 MIL/uL (ref 4.22–5.81)
RDW: 12.7 % (ref 11.5–15.5)
WBC: 7.9 10*3/uL (ref 4.0–10.5)

## 2016-09-30 NOTE — Progress Notes (Signed)
VASCULAR LAB PRELIMINARY  PRELIMINARY  PRELIMINARY  PRELIMINARY  Bilateral lower extremity venous duplex completed.    Preliminary report:  There is no DVT or SVT noted in the bilateral lower extremities.   Travonne Schowalter, RVT 09/30/2016, 10:35 AM

## 2016-09-30 NOTE — Progress Notes (Signed)
Progress Note  Patient Name: Matthew Evans Date of Encounter: 09/30/2016  Primary Cardiologist: Dr. Tresa Endo  Subjective   Brief episodes of dyspnea   Inpatient Medications    Scheduled Meds: . amLODipine  5 mg Oral Daily  . aspirin EC  325 mg Oral Daily  . clopidogrel  75 mg Oral Daily  . heparin  5,000 Units Subcutaneous Q8H  . isosorbide mononitrate  90 mg Oral Daily  . levothyroxine  200 mcg Oral QAC breakfast  . losartan  50 mg Oral Daily  . metoprolol succinate  25 mg Oral Daily  . umeclidinium-vilanterol  1 puff Inhalation Daily   Continuous Infusions:  PRN Meds: acetaminophen, hydrALAZINE, morphine injection, ondansetron (ZOFRAN) IV, oxyCODONE-acetaminophen, zolpidem   Vital Signs    Vitals:   09/29/16 0941 09/29/16 1321 09/29/16 2039 09/30/16 0504  BP: (!) 149/64 (!) 147/68 135/66 (!) 119/51  Pulse: 87 63 62 (!) 58  Resp:  Temp:  97.6 F (36.4 C) 98.1 F (36.7 C) 98 F (36.7 C)  TempSrc:  Oral Oral Oral  SpO2:  97% 95% 95%  Weight:      Height:        Intake/Output Summary (Last 24 hours) at 09/30/16 1030 Last data filed at 09/29/16 2040  Gross per 24 hour  Intake              720 ml  Output                0 ml  Net              720 ml   Filed Weights   09/28/16 0919 09/28/16 1511  Weight: 210 lb (95.3 kg) 212 lb 8 oz (96.4 kg)    Telemetry    NSR - Personally Reviewed 09/30/2016   ECG    Sinus bradycardia 56 bpm, LBBB, LAD - Personally Reviewed  Physical Exam   Affect appropriate Healthy:  appears stated age HEENT: normal Neck supple with no adenopathy JVP normal no bruits no thyromegaly Lungs clear with no wheezing and good diaphragmatic motion Heart:  S1/S2 no murmur, no rub, gallop or click PMI normal Abdomen: benighn, BS positve, no tenderness, no AAA no bruit.  No HSM or HJR Distal pulses intact with no bruits No edema Neuro non-focal Skin warm and dry No muscular weakness   Labs    Chemistry  Recent  Labs Lab 09/28/16 0959 09/28/16 1452 09/30/16 0236  NA 142  --  138  K 3.5  --  3.6  CL 101  --  103  CO2  --   --  26  GLUCOSE 92  --  98  BUN 15  --  15  CREATININE 0.90 0.88 0.98  CALCIUM  --   --  8.5*  PROT  --   --  5.9*  ALBUMIN  --   --  3.0*  AST  --   --  24  ALT  --   --  15*  ALKPHOS  --   --  91  BILITOT  --   --  0.8  GFRNONAA  --  >60 >60  GFRAA  --  >60 >60  ANIONGAP  --   --  9     Hematology  Recent Labs Lab 09/28/16 0920 09/28/16 0959 09/28/16 1452 09/30/16 0236  WBC 9.9  --  8.6 7.9  RBC 5.20  --  4.97 4.85  HGB 14.7 15.6 13.9 13.3  HCT 44.8 46.0 42.1 41.2  MCV 86.2  --  84.7 84.9  MCH 28.3  --  28.0 27.4  MCHC 32.8  --  33.0 32.3  RDW 12.7  --  12.7 12.7  PLT 282  --  282 292    Cardiac Enzymes  Recent Labs Lab 09/28/16 1452 09/28/16 1811 09/28/16 2128  TROPONINI 0.04* 0.05* 0.05*     Recent Labs Lab 09/28/16 0957  TROPIPOC 0.00     BNP  Recent Labs Lab 09/28/16 0920  BNP 185.2*     DDimer No results for input(s): DDIMER in the last 168 hours.   Radiology    Ct Angio Chest Pe W And/or Wo Contrast  Result Date: 09/28/2016 CLINICAL DATA:  Shortness of breath. Left-sided chest pain. Recent right shoulder surgery. EXAM: CT ANGIOGRAPHY CHEST WITH CONTRAST TECHNIQUE: Multidetector CT imaging of the chest was performed using the standard protocol during bolus administration of intravenous contrast. Multiplanar CT image reconstructions and MIPs were obtained to evaluate the vascular anatomy. CONTRAST:  100 mL Isovue 370 COMPARISON:  None. FINDINGS: Cardiovascular: Negative for pulmonary embolism. Atherosclerotic calcifications at the aortic arch. Caliber of the thoracic aorta is within normal limits. Atherosclerotic calcification involving the descending thoracic aorta. Mediastinum/Nodes: Small lymph nodes throughout the mediastinum. Overall, no significant lymph node enlargement in the chest. Mild soft tissue edema in the right  upper chest probably related to recent shoulder surgery. No significant pericardial fluid. Lungs/Pleura: Trachea and mainstem bronchi are patent. Small nodular densities along the right minor fissure best seen on the sagittal reformats, image 34, sequence 9. Largest nodule measures 6 mm in this area. Small amount of dependent atelectasis in the left lower lobe. No large areas of consolidation or airspace disease. Probable scarring along the posterolateral right chest related to old right rib fractures. Upper Abdomen: No acute abnormality in upper abdomen. Musculoskeletal: Bridging osteophytes in the lower thoracic spine. Multiple old right rib fractures along the posterior and lateral aspect. Review of the MIP images confirms the above findings. IMPRESSION: Negative for a pulmonary embolism. Few patchy and nodular densities along the right minor fissure are nonspecific. There is a nodular component measuring up to 6 mm. Non-contrast chest CT at 6-12 months is recommended. If the nodule is stable at time of repeat CT, then future CT at 18-24 months (from today's scan) is considered optional for low-risk patients, but is recommended for high-risk patients. This recommendation follows the consensus statement: Guidelines for Management of Incidental Pulmonary Nodules Detected on CT Images: From the Fleischner Society 2017; Radiology 2017; 284:228-243. Old right rib fractures with adjacent scarring. Electronically Signed   By: Richarda Overlie M.D.   On: 09/28/2016 13:06   Nm Myocar Multi W/spect W/wall Motion / Ef  Result Date: 09/29/2016 CLINICAL DATA:  66 year old male with chest pain. History of myocardial infarction with coronary catheterization and angioplasty. EXAM: MYOCARDIAL IMAGING WITH SPECT (REST AND PHARMACOLOGIC-STRESS) GATED LEFT VENTRICULAR WALL MOTION STUDY LEFT VENTRICULAR EJECTION FRACTION TECHNIQUE: Standard myocardial SPECT imaging was performed after resting intravenous injection of 10 mCi Tc-70m  tetrofosmin. Subsequently, intravenous infusion of Lexiscan was performed under the supervision of the Cardiology staff. At peak effect of the drug, 30 mCi Tc-31m tetrofosmin was injected intravenously and standard myocardial SPECT imaging was performed. Quantitative gated imaging was also performed to evaluate left ventricular wall motion, and estimate left ventricular ejection fraction. COMPARISON:  None. FINDINGS: Perfusion: Moderate region of mild decrease counts in the mid and apical segment anterior septal wall which  partially reverses. Decreased Decrease counts within mid and basilar segment of the inferior wall which are moderate and partially reversed. Wall Motion: Normal left ventricular wall motion. No left ventricular dilation. Left Ventricular Ejection Fraction: 31 % End diastolic volume 141 ml End systolic volume 97 ml IMPRESSION: 1. Peri-infarct ischemia surrounding a moderate infarction in the anterior septal wall. Mild reversible ischemia in the mid and basilar basilar segment of the inferior wall. 2. Septal hypokinesia.  Mild ventricular dilatation. 3. Left ventricular ejection fraction 31% 4. Non invasive risk stratification*: High: *2012 Appropriate Use Criteria for Coronary Revascularization Focused Update: J Am Coll Cardiol. 2012;59(9):857-881. http://[email or web address]?[email or web address] Electronically Signed   By: Genevive Bi M.D.   On: 09/29/2016 14:24    Cardiac Studies   Stress test and echo pending.   Patient Profile     66 y.o. male a history of COPD, hypertension, hypothyroidism, hyperlipidemia, GERD and coronary artery disease and history of ST elevation MI with RCA stent 2009 and staged cutting balloon to a high grade LAD stenosis whitch involved multiple branches and stenting was not done to reduce potential for jailing of his multiple branches and stenting was not done to reduce potential for jailing of his multiple branches. 2010 he was found to have  50-60% stenosis beyond his RCA stent in the LAD intervention site remained patent. He has a documented stable abdominal aortic aneurysm. Presented for increased shortness of breath and very brief chest pain. Right shoulder surgery was done Monday.  Assessment & Plan    Chest pain -abnormal myovue with inferior ischemia Known CAD post intervention LAD and moderate residual disease Troponin Minimal elevation with ECG LBBB.  For cath on Monday Continue asa/plavix and beta blocker    Signed, Charlton Haws, MD  09/30/2016, 10:30 AM

## 2016-09-30 NOTE — Progress Notes (Signed)
[facility] PROGRESS NOTE  Matthew Evans ZOX:[id number] DOB: 03-08-1951 DOA: 09/28/2016   PCP: Matthew Sites, MD     Assessment/Plan: Principal Problem:   Chest pain Active Problems:   Hypothyroid   COPD (chronic obstructive pulmonary disease) (HCC)   Post-op pain   Elevated troponin    66 y.o. male who is being seen today for the evaluation of NSTEMI at the request of Dr. Melynda Ripple. [name] has a history of coronary artery disease and history of ST elevation MI in 2009 was to the RCA. As his coronary disease in the LAD and high-grade LAD stenosis and underwent cutting balloon angioplasty. He since had a number of other cardiac catheterization showing mild to moderate coronary disease. found to have an EF of 40-45% with diffuse hypokinesis on echo,cardiology consulted ,had a stress test which was abnormal  Assessment/Plan Coronary artery disease-unstable angina - serial trop flat  - asa/Plavix /heparin gtt? - tele bed, cardiac monitoring  stress test grossly abnormal, showed peri-infarct ischemia, reversible ischemia in the mid and basilar segments of the inferior wall Plan is for cardiac catheterization on Monday Bilateral lower extremity Doppler negative   Chronic pain/Shoulder surgery pain Continue  Robaxin Cont percocet  Hypothyroidism Cont OP synthroid 200 mcg qd No signs of hyper or hypothyroidism  Hypertension When necessary hydralazine 10 mg IV as needed for severe blood pressure Cont cozaar, toprol- XL, norvasc, hctz  Emphysema Cont Anoro qd, when necessary nebs     DVT prophylaxsis heparin  Code Status:  Full code     Family Communication: Discussed in detail with the patient, all imaging results, lab results explained to the patient   Disposition Plan:   Cath monday      Consultants:  cardiology  Procedures:   none   Antibiotics: Anti-infectives    None         HPI/Subjective: Chest pain  free  Objective: Vitals:   09/29/16 0941 09/29/16 1321 09/29/16 2039 09/30/16 0504  BP: (!) 149/64 (!) 147/68 135/66 (!) 119/51  Pulse: 87 63 62 (!) 58  Resp:  Temp:  97.6 F (36.4 C) 98.1 F (36.7 C) 98 F (36.7 C)  TempSrc:  Oral Oral Oral  SpO2:  97% 95% 95%  Weight:      Height:        Intake/Output Summary (Last 24 hours) at 09/30/16 0941 Last data filed at 09/29/16 2040  Gross per 24 hour  Intake              720 ml  Output                0 ml  Net              720 ml    Exam:  Examination:  General exam: Appears calm and comfortable  Respiratory system: Clear to auscultation. Respiratory effort normal. Cardiovascular system: S1 & S2 heard, RRR. No JVD, murmurs, rubs, gallops or clicks. No pedal edema. Gastrointestinal system: Abdomen is nondistended, soft and nontender. No organomegaly or masses felt. Normal bowel sounds heard. Central nervous system: Alert and oriented. No focal neurological deficits. Extremities: Symmetric 5 x 5 power. Skin: No rashes, lesions or ulcers Psychiatry: Judgement and insight appear normal. Mood & affect appropriate.     Data Reviewed: I have personally reviewed following labs and imaging studies  Micro Results No results found for this or any previous visit (from the past 240 hour(s)).  Radiology Reports Dg Chest 2 View  Result Date: 09/28/2016 CLINICAL DATA:  Shortness of breath. EXAM: CHEST  2 VIEW COMPARISON:  08/29/2016 FINDINGS: Heart and mediastinal contours are within normal limits. No focal opacities or effusions. No acute bony abnormality. IMPRESSION: No active cardiopulmonary disease. Electronically Signed   By: Charlett Nose M.D.   On: 09/28/2016 10:16   Ct Angio Chest Pe W And/or Wo Contrast  Result Date: 09/28/2016 CLINICAL DATA:  Shortness of breath. Left-sided chest pain. Recent right shoulder surgery. EXAM: CT ANGIOGRAPHY CHEST WITH CONTRAST TECHNIQUE: Multidetector CT imaging of the chest was  performed using the standard protocol during bolus administration of intravenous contrast. Multiplanar CT image reconstructions and MIPs were obtained to evaluate the vascular anatomy. CONTRAST:  100 mL Isovue 370 COMPARISON:  None. FINDINGS: Cardiovascular: Negative for pulmonary embolism. Atherosclerotic calcifications at the aortic arch. Caliber of the thoracic aorta is within normal limits. Atherosclerotic calcification involving the descending thoracic aorta. Mediastinum/Nodes: Small lymph nodes throughout the mediastinum. Overall, no significant lymph node enlargement in the chest. Mild soft tissue edema in the right upper chest probably related to recent shoulder surgery. No significant pericardial fluid. Lungs/Pleura: Trachea and mainstem bronchi are patent. Small nodular densities along the right minor fissure best seen on the sagittal reformats, image 34, sequence 9. Largest nodule measures 6 mm in this area. Small amount of dependent atelectasis in the left lower lobe. No large areas of consolidation or airspace disease. Probable scarring along the posterolateral right chest related to old right rib fractures. Upper Abdomen: No acute abnormality in upper abdomen. Musculoskeletal: Bridging osteophytes in the lower thoracic spine. Multiple old right rib fractures along the posterior and lateral aspect. Review of the MIP images confirms the above findings. IMPRESSION: Negative for a pulmonary embolism. Few patchy and nodular densities along the right minor fissure are nonspecific. There is a nodular component measuring up to 6 mm. Non-contrast chest CT at 6-12 months is recommended. If the nodule is stable at time of repeat CT, then future CT at 18-24 months (from today's scan) is considered optional for low-risk patients, but is recommended for high-risk patients. This recommendation follows the consensus statement: Guidelines for Management of Incidental Pulmonary Nodules Detected on CT Images: From the  Fleischner Society 2017; Radiology 2017; 284:228-243. Old right rib fractures with adjacent scarring. Electronically Signed   By: Richarda Overlie M.D.   On: 09/28/2016 13:06   Nm Myocar Multi W/spect W/wall Motion / Ef  Result Date: 09/29/2016 CLINICAL DATA:  66 year old male with chest pain. History of myocardial infarction with coronary catheterization and angioplasty. EXAM: MYOCARDIAL IMAGING WITH SPECT (REST AND PHARMACOLOGIC-STRESS) GATED LEFT VENTRICULAR WALL MOTION STUDY LEFT VENTRICULAR EJECTION FRACTION TECHNIQUE: Standard myocardial SPECT imaging was performed after resting intravenous injection of 10 mCi Tc-9m tetrofosmin. Subsequently, intravenous infusion of Lexiscan was performed under the supervision of the Cardiology staff. At peak effect of the drug, 30 mCi Tc-3m tetrofosmin was injected intravenously and standard myocardial SPECT imaging was performed. Quantitative gated imaging was also performed to evaluate left ventricular wall motion, and estimate left ventricular ejection fraction. COMPARISON:  None. FINDINGS: Perfusion: Moderate region of mild decrease counts in the mid and apical segment anterior septal wall which partially reverses. Decreased Decrease counts within mid and basilar segment of the inferior wall which are moderate and partially reversed. Wall Motion: Normal left ventricular wall motion. No left ventricular dilation. Left Ventricular Ejection Fraction: 31 % End diastolic volume 141 ml End systolic volume 97 ml IMPRESSION: 1.  Peri-infarct ischemia surrounding a moderate infarction in the anterior septal wall. Mild reversible ischemia in the mid and basilar basilar segment of the inferior wall. 2. Septal hypokinesia.  Mild ventricular dilatation. 3. Left ventricular ejection fraction 31% 4. Non invasive risk stratification*: High: *2012 Appropriate Use Criteria for Coronary Revascularization Focused Update: J Am Coll Cardiol. 2012;59(9):857-881.  http://[email or web address] Electronically Signed   By: Genevive Bi M.D.   On: 09/29/2016 14:24     CBC  Recent Labs Lab 09/28/16 0920 09/28/16 0959 09/28/16 1452 09/30/16 0236  WBC 9.9  --  8.6 7.9  HGB 14.7 15.6 13.9 13.3  HCT 44.8 46.0 42.1 41.2  PLT 282  --  282 292  MCV 86.2  --  84.7 84.9  MCH 28.3  --  28.0 27.4  MCHC 32.8  --  33.0 32.3  RDW 12.7  --  12.7 12.7  LYMPHSABS 1.4  --   --   --   MONOABS 0.9  --   --   --   EOSABS 0.4  --   --   --   BASOSABS 0.1  --   --   --     Chemistries   Recent Labs Lab 09/28/16 0959 09/28/16 1452 09/30/16 0236  NA 142  --  138  K 3.5  --  3.6  CL 101  --  103  CO2  --   --  26  GLUCOSE 92  --  98  BUN 15  --  15  CREATININE 0.90 0.88 0.98  CALCIUM  --   --  8.5*  AST  --   --  24  ALT  --   --  15*  ALKPHOS  --   --  91  BILITOT  --   --  0.8   ------------------------------------------------------------------------------------------------------------------ estimated creatinine clearance is 83.5 mL/min (by C-G formula based on SCr of 0.98 mg/dL). ------------------------------------------------------------------------------------------------------------------ No results for input(s): HGBA1C in the last 72 hours. ------------------------------------------------------------------------------------------------------------------ No results for input(s): CHOL, HDL, LDLCALC, TRIG, CHOLHDL, LDLDIRECT in the last 72 hours. ------------------------------------------------------------------------------------------------------------------ No results for input(s): TSH, T4TOTAL, T3FREE, THYROIDAB in the last 72 hours.  Invalid input(s): FREET3 ------------------------------------------------------------------------------------------------------------------ No results for input(s): VITAMINB12, FOLATE, FERRITIN, TIBC, IRON, RETICCTPCT in the last 72 hours.  Coagulation profile No results for  input(s): INR, PROTIME in the last 168 hours.  No results for input(s): DDIMER in the last 72 hours.  Cardiac Enzymes  Recent Labs Lab 09/28/16 1452 09/28/16 1811 09/28/16 2128  TROPONINI 0.04* 0.05* 0.05*   ------------------------------------------------------------------------------------------------------------------ Invalid input(s): POCBNP   CBG: No results for input(s): GLUCAP in the last 168 hours.     Studies: Dg Chest 2 View  Result Date: 09/28/2016 CLINICAL DATA:  Shortness of breath. EXAM: CHEST  2 VIEW COMPARISON:  08/29/2016 FINDINGS: Heart and mediastinal contours are within normal limits. No focal opacities or effusions. No acute bony abnormality. IMPRESSION: No active cardiopulmonary disease. Electronically Signed   By: Charlett Nose M.D.   On: 09/28/2016 10:16   Ct Angio Chest Pe W And/or Wo Contrast  Result Date: 09/28/2016 CLINICAL DATA:  Shortness of breath. Left-sided chest pain. Recent right shoulder surgery. EXAM: CT ANGIOGRAPHY CHEST WITH CONTRAST TECHNIQUE: Multidetector CT imaging of the chest was performed using the standard protocol during bolus administration of intravenous contrast. Multiplanar CT image reconstructions and MIPs were obtained to evaluate the vascular anatomy. CONTRAST:  100 mL Isovue 370 COMPARISON:  None. FINDINGS: Cardiovascular: Negative for pulmonary embolism. Atherosclerotic calcifications at the aortic arch.  Caliber of the thoracic aorta is within normal limits. Atherosclerotic calcification involving the descending thoracic aorta. Mediastinum/Nodes: Small lymph nodes throughout the mediastinum. Overall, no significant lymph node enlargement in the chest. Mild soft tissue edema in the right upper chest probably related to recent shoulder surgery. No significant pericardial fluid. Lungs/Pleura: Trachea and mainstem bronchi are patent. Small nodular densities along the right minor fissure best seen on the sagittal reformats, image 34,  sequence 9. Largest nodule measures 6 mm in this area. Small amount of dependent atelectasis in the left lower lobe. No large areas of consolidation or airspace disease. Probable scarring along the posterolateral right chest related to old right rib fractures. Upper Abdomen: No acute abnormality in upper abdomen. Musculoskeletal: Bridging osteophytes in the lower thoracic spine. Multiple old right rib fractures along the posterior and lateral aspect. Review of the MIP images confirms the above findings. IMPRESSION: Negative for a pulmonary embolism. Few patchy and nodular densities along the right minor fissure are nonspecific. There is a nodular component measuring up to 6 mm. Non-contrast chest CT at 6-12 months is recommended. If the nodule is stable at time of repeat CT, then future CT at 18-24 months (from today's scan) is considered optional for low-risk patients, but is recommended for high-risk patients. This recommendation follows the consensus statement: Guidelines for Management of Incidental Pulmonary Nodules Detected on CT Images: From the Fleischner Society 2017; Radiology 2017; 284:228-243. Old right rib fractures with adjacent scarring. Electronically Signed   By: Richarda Overlie M.D.   On: 09/28/2016 13:06   Nm Myocar Multi W/spect W/wall Motion / Ef  Result Date: 09/29/2016 CLINICAL DATA:  66 year old male with chest pain. History of myocardial infarction with coronary catheterization and angioplasty. EXAM: MYOCARDIAL IMAGING WITH SPECT (REST AND PHARMACOLOGIC-STRESS) GATED LEFT VENTRICULAR WALL MOTION STUDY LEFT VENTRICULAR EJECTION FRACTION TECHNIQUE: Standard myocardial SPECT imaging was performed after resting intravenous injection of 10 mCi Tc-57m tetrofosmin. Subsequently, intravenous infusion of Lexiscan was performed under the supervision of the Cardiology staff. At peak effect of the drug, 30 mCi Tc-46m tetrofosmin was injected intravenously and standard myocardial SPECT imaging was  performed. Quantitative gated imaging was also performed to evaluate left ventricular wall motion, and estimate left ventricular ejection fraction. COMPARISON:  None. FINDINGS: Perfusion: Moderate region of mild decrease counts in the mid and apical segment anterior septal wall which partially reverses. Decreased Decrease counts within mid and basilar segment of the inferior wall which are moderate and partially reversed. Wall Motion: Normal left ventricular wall motion. No left ventricular dilation. Left Ventricular Ejection Fraction: 31 % End diastolic volume 141 ml End systolic volume 97 ml IMPRESSION: 1. Peri-infarct ischemia surrounding a moderate infarction in the anterior septal wall. Mild reversible ischemia in the mid and basilar basilar segment of the inferior wall. 2. Septal hypokinesia.  Mild ventricular dilatation. 3. Left ventricular ejection fraction 31% 4. Non invasive risk stratification*: High: *2012 Appropriate Use Criteria for Coronary Revascularization Focused Update: J Am Coll Cardiol. 2012;59(9):857-881. http://[email or web address]?[email or web address] Electronically Signed   By: Genevive Bi M.D.   On: 09/29/2016 14:24      No results found for: HGBA1C Lab Results  Component Value Date   LDLCALC 66 09/01/2016   CREATININE 0.98 09/30/2016       Scheduled Meds: . amLODipine  5 mg Oral Daily  . aspirin EC  325 mg Oral Daily  . clopidogrel  75 mg Oral Daily  . heparin  5,000 Units Subcutaneous Q8H  . isosorbide mononitrate  90 mg Oral  Daily  . levothyroxine  200 mcg Oral QAC breakfast  . losartan  50 mg Oral Daily  . metoprolol succinate  25 mg Oral Daily  . umeclidinium-vilanterol  1 puff Inhalation Daily   Continuous Infusions:   LOS: 0 days    Time spent: >30 MINS    Richarda Overlie  [facility] Pager [phone]. If 7PM-7AM, please contact night-coverage at [email or web address], password [facility] 09/30/2016, 9:41 AM  LOS: 0 days

## 2016-10-01 DIAGNOSIS — E032 Hypothyroidism due to medicaments and other exogenous substances: Secondary | ICD-10-CM

## 2016-10-01 DIAGNOSIS — J410 Simple chronic bronchitis: Secondary | ICD-10-CM | POA: Diagnosis not present

## 2016-10-01 DIAGNOSIS — Z882 Allergy status to sulfonamides status: Secondary | ICD-10-CM | POA: Diagnosis not present

## 2016-10-01 DIAGNOSIS — Z79899 Other long term (current) drug therapy: Secondary | ICD-10-CM | POA: Diagnosis not present

## 2016-10-01 DIAGNOSIS — R001 Bradycardia, unspecified: Secondary | ICD-10-CM | POA: Diagnosis present

## 2016-10-01 DIAGNOSIS — Z955 Presence of coronary angioplasty implant and graft: Secondary | ICD-10-CM | POA: Diagnosis not present

## 2016-10-01 DIAGNOSIS — I69311 Memory deficit following cerebral infarction: Secondary | ICD-10-CM | POA: Diagnosis not present

## 2016-10-01 DIAGNOSIS — Z7982 Long term (current) use of aspirin: Secondary | ICD-10-CM | POA: Diagnosis not present

## 2016-10-01 DIAGNOSIS — I252 Old myocardial infarction: Secondary | ICD-10-CM | POA: Diagnosis not present

## 2016-10-01 DIAGNOSIS — K219 Gastro-esophageal reflux disease without esophagitis: Secondary | ICD-10-CM | POA: Diagnosis present

## 2016-10-01 DIAGNOSIS — R748 Abnormal levels of other serum enzymes: Secondary | ICD-10-CM | POA: Diagnosis not present

## 2016-10-01 DIAGNOSIS — Z8249 Family history of ischemic heart disease and other diseases of the circulatory system: Secondary | ICD-10-CM | POA: Diagnosis not present

## 2016-10-01 DIAGNOSIS — E785 Hyperlipidemia, unspecified: Secondary | ICD-10-CM | POA: Diagnosis present

## 2016-10-01 DIAGNOSIS — M25511 Pain in right shoulder: Secondary | ICD-10-CM | POA: Diagnosis present

## 2016-10-01 DIAGNOSIS — I25118 Atherosclerotic heart disease of native coronary artery with other forms of angina pectoris: Secondary | ICD-10-CM | POA: Diagnosis not present

## 2016-10-01 DIAGNOSIS — I10 Essential (primary) hypertension: Secondary | ICD-10-CM | POA: Diagnosis present

## 2016-10-01 DIAGNOSIS — R079 Chest pain, unspecified: Secondary | ICD-10-CM | POA: Diagnosis present

## 2016-10-01 DIAGNOSIS — R110 Nausea: Secondary | ICD-10-CM | POA: Diagnosis present

## 2016-10-01 DIAGNOSIS — Z825 Family history of asthma and other chronic lower respiratory diseases: Secondary | ICD-10-CM | POA: Diagnosis not present

## 2016-10-01 DIAGNOSIS — E039 Hypothyroidism, unspecified: Secondary | ICD-10-CM | POA: Diagnosis present

## 2016-10-01 DIAGNOSIS — G8918 Other acute postprocedural pain: Secondary | ICD-10-CM | POA: Diagnosis present

## 2016-10-01 DIAGNOSIS — Z87891 Personal history of nicotine dependence: Secondary | ICD-10-CM | POA: Diagnosis not present

## 2016-10-01 DIAGNOSIS — L930 Discoid lupus erythematosus: Secondary | ICD-10-CM | POA: Diagnosis present

## 2016-10-01 DIAGNOSIS — J439 Emphysema, unspecified: Secondary | ICD-10-CM | POA: Diagnosis present

## 2016-10-01 DIAGNOSIS — I714 Abdominal aortic aneurysm, without rupture: Secondary | ICD-10-CM | POA: Diagnosis present

## 2016-10-01 DIAGNOSIS — Z888 Allergy status to other drugs, medicaments and biological substances status: Secondary | ICD-10-CM | POA: Diagnosis not present

## 2016-10-01 DIAGNOSIS — I200 Unstable angina: Secondary | ICD-10-CM | POA: Diagnosis not present

## 2016-10-01 DIAGNOSIS — I447 Left bundle-branch block, unspecified: Secondary | ICD-10-CM | POA: Diagnosis present

## 2016-10-01 DIAGNOSIS — I25110 Atherosclerotic heart disease of native coronary artery with unstable angina pectoris: Secondary | ICD-10-CM | POA: Diagnosis present

## 2016-10-01 DIAGNOSIS — Z7902 Long term (current) use of antithrombotics/antiplatelets: Secondary | ICD-10-CM | POA: Diagnosis not present

## 2016-10-01 LAB — HEPARIN LEVEL (UNFRACTIONATED): HEPARIN UNFRACTIONATED: 0.34 [IU]/mL (ref 0.30–0.70)

## 2016-10-01 MED ORDER — HEPARIN (PORCINE) IN NACL 100-0.45 UNIT/ML-% IJ SOLN
1250.0000 [IU]/h | INTRAMUSCULAR | Status: DC
  Administered 2016-10-01: 1250 [IU]/h via INTRAVENOUS
  Filled 2016-10-01 (×2): qty 250

## 2016-10-01 MED ORDER — SODIUM CHLORIDE 0.9 % WEIGHT BASED INFUSION
3.0000 mL/kg/h | INTRAVENOUS | Status: DC
  Administered 2016-10-02: 3 mL/kg/h via INTRAVENOUS

## 2016-10-01 MED ORDER — ASPIRIN 81 MG PO CHEW
81.0000 mg | CHEWABLE_TABLET | ORAL | Status: AC
  Administered 2016-10-02: 81 mg via ORAL
  Filled 2016-10-01: qty 1

## 2016-10-01 MED ORDER — SODIUM CHLORIDE 0.9 % WEIGHT BASED INFUSION
1.0000 mL/kg/h | INTRAVENOUS | Status: DC
  Administered 2016-10-02 (×2): 1 mL/kg/h via INTRAVENOUS

## 2016-10-01 MED ORDER — SODIUM CHLORIDE 0.9% FLUSH: 3.0000 mL | INTRAVENOUS | Status: DC | PRN

## 2016-10-01 MED ORDER — SODIUM CHLORIDE 0.9 % IV SOLN: 250.0000 mL | INTRAVENOUS | Status: DC | PRN

## 2016-10-01 MED ORDER — SODIUM CHLORIDE 0.9% FLUSH
3.0000 mL | Freq: Two times a day (BID) | INTRAVENOUS | Status: DC
Start: 2016-10-01 — End: 2016-10-02
  Administered 2016-10-02: 3 mL via INTRAVENOUS

## 2016-10-01 NOTE — Progress Notes (Addendum)
ANTICOAGULATION CONSULT NOTE - Initial Consult  Pharmacy Consult for Heparin Indication: chest pain/ACS  Allergies  Allergen Reactions  . Niaspan [Niacin] Rash  . Septra [Sulfamethoxazole-Trimethoprim] Rash  . Simcor [Niacin-Simvastatin Er] Rash  . Zocor [Simvastatin] Rash    Patient Measurements: Height:  (172.7 cm) Weight: 212 lb 8 oz (96.4 kg) IBW/kg (Calculated) : 68.4 Heparin Dosing Weight: 88.7kg  Vital Signs: Temp: 97.6 F (36.4 C) (04/29 0451) Temp Source: Oral (04/29 0451) BP: 128/51 (04/29 0951) Pulse Rate: 60 (04/29 0451)  Labs:  Recent Labs  09/28/16 1452 09/28/16 1811 09/28/16 2128 09/30/16 0236  HGB 13.9  --   --  13.3  HCT 42.1  --   --  41.2  PLT 282  --   --  292  CREATININE 0.88  --   --  0.98  TROPONINI 0.04* 0.05* 0.05*  --     Estimated Creatinine Clearance: 83.5 mL/min (by C-G formula based on SCr of 0.98 mg/dL).   Medical History: Past Medical History:  Diagnosis Date  . AAA (abdominal aortic aneurysm) (HCC)    mild; doppler 08/29/12-3.4x3.7  . Arthritis    "hands, back" (09/28/2016)  . CAD (coronary artery disease)    echo 07/26/12-EF 55-60%; myoview 07/26/12-low normal wall motion with mild apical hypokinesis as well a septal wall motion consistent with LBBB  . Carotid artery occlusion   . Chronic lower back pain   . Cutaneous lupus erythematosus    [name], [name]  . Dementia    "moderate; dx'd in ~ 2011" (09/28/2016)  . Dermatitis   . Emphysema   . GERD (gastroesophageal reflux disease)   . H/O hiatal hernia   . Headache(784.0)   . History of palpitations   . HTN (hypertension)   . Hyperlipidemia   . Hypothyroidism    HX OF RADIOACTIVE IODINE   . Memory loss    d/t mini stroke  . Mini stroke (HCC) ~ 2011  . Myocardial infarction (HCC) 09/2007   PCI to RCA  . Shortness of breath    WITH EXERTION  . Sleep apnea    sleep study 12/14/06 ahi 16.89,  during rem 12.3, supine ahi 57.49; cpap 02/22/07-c-flex of 3 at 13cm h2o; PT STATES HE DOES NOT USE HIS CPAP EVERY NIGHT  . Sleep apnea    "aien't worn mask in 3 years" (09/28/2016)  . Stroke [facility])    PT STATES NEUROLOGIST TOLD HIM HE HAD HAD MINI STROKE  . Thyroid disease    hypothyroidism   Assessment: 66yom with abnormal stress test, troponin elevation, and chest pain to begin heparin with plan for cath tomorrow. No anticoagulants pta. Received a dose of sq heparin 5000 units at 0553 this morning so will not bolus. Baseline labs wnl.  Goal of Therapy:  Heparin level 0.3-0.7 units/ml Monitor platelets by anticoagulation protocol: Yes   Plan:  1) Start heparin at 1250 units/hr 2) 6 hour heparin level 3) Daily heparin level and CBC  Fredrik Rigger 10/01/2016,11:11 AM    ADDENDUM: Initial heparin level is therapeutic at 0.34. Continue heparin at 1250 units/hr and follow up AM labs.  Fredrik Rigger 10/01/2016, 6:49 AM

## 2016-10-01 NOTE — Evaluation (Signed)
Physical Therapy Evaluation Patient Details Name: Matthew Evans MRN: [id number] DOB: 05-24-1951 Today's Date: 10/01/2016   History of Present Illness  Pt is a 66 yo male admitted through ED on 09/28/16 with chest pain and elevated troponin. PMH significant fot MI, Lupus, CAD, emphysema, COPD, hypothyroid, HTN, AAA, CAD. Pt is scheduled for cardiac catheterization 10/02/16. Pt underwent shoulder surgery incluidng partial bicep tendon repair and removeal of bone spurs on 09/25/16. Pt was slated to start OP therapy thursday 09/28/16, but was here in the hospital.   Clinical Impression  Pt presents with the above diagnosis and below deficits for therapy evaluation. Pt had shoulder surgery >1 week before hospitalization and is now awaiting a cardiac catheterization on 10/02/16. Pt is Mod I to supervision for mobility this session and reports no significant deficits prior to admission. Pt will benefit from continued acute PT services in order to address the below deficits prior to discharge back home and to outpatient therapy. Therapy will need to contact Dr. Ranell Patrick in regards to protocol for ROM and strengthening to RUE.     Follow Up Recommendations Outpatient PT    Equipment Recommendations  None recommended by PT    Recommendations for Other Services       Precautions / Restrictions Precautions Precautions: Shoulder Type of Shoulder Precautions: Unsure, No Active ROM to be conservative Shoulder Interventions: Shoulder sling/immobilizer;For comfort (when leaving home) Precaution Booklet Issued: No Restrictions Weight Bearing Restrictions: Yes RUE Weight Bearing: Non weight bearing      Mobility  Bed Mobility Overal bed mobility: Independent             General bed mobility comments: able to get into and out of bed without assistance  Transfers Overall transfer level: Modified independent Equipment used: None             General transfer comment: able to stand without  assistance from EOB  Ambulation/Gait             General Gait Details: Did not assess this session  Stairs            Wheelchair Mobility    Modified Rankin (Stroke Patients Only)       Balance Overall balance assessment: No apparent balance deficits (not formally assessed)                                           Pertinent Vitals/Pain Pain Assessment: Faces Faces Pain Scale: Hurts even more Pain Location: Right shoulder with mobility.  Pain Descriptors / Indicators: Grimacing;Guarding;Sore Pain Intervention(s): Monitored during session;Repositioned;Premedicated before session;Ice applied    Home Living Family/patient expects to be discharged to:: Private residence Living Arrangements: Spouse/significant other Available Help at Discharge: Family Type of Home: House                Prior Function Level of Independence: Independent               Hand Dominance   Dominant Hand: Right    Extremity/Trunk Assessment   Upper Extremity Assessment Upper Extremity Assessment: RUE deficits/detail RUE Deficits / Details: decreased ROM and strength following surgery.  RUE: Unable to fully assess due to pain;Unable to fully assess due to immobilization    Lower Extremity Assessment Lower Extremity Assessment: Overall WFL for tasks assessed    Cervical / Trunk Assessment Cervical / Trunk Assessment: Normal  Communication  Communication: No difficulties  Cognition Arousal/Alertness: Awake/alert Behavior During Therapy: WFL for tasks assessed/performed Overall Cognitive Status: Within Functional Limits for tasks assessed                                        General Comments      Exercises Shoulder Exercises Shoulder Flexion: Self ROM;PROM;Right;5 reps;Supine;Limitations Shoulder External Rotation: PROM;AROM;Right;5 reps;Supine Wrist Flexion: AROM;Right;10 reps;Supine Wrist Extension: AROM;Right;10  reps;Supine Digit Composite Flexion: AROM;Right;10 reps;Supine Composite Extension: AROM;Right;10 reps;Supine Pendulum exercises (written home exercise program): Min-guard   Assessment/Plan    PT Assessment Patient needs continued PT services  PT Problem List Decreased strength;Decreased range of motion;Decreased activity tolerance;Pain       PT Treatment Interventions Functional mobility training;Therapeutic activities;Therapeutic exercise;Patient/family education;Gait training    PT Goals (Current goals can be found in the Care Plan section)  Acute Rehab PT Goals Patient Stated Goal: to get home PT Goal Formulation: With patient/family Time For Goal Achievement: 10/08/16 Potential to Achieve Goals: Good    Frequency Min 3X/week   Barriers to discharge        Co-evaluation               End of Session Equipment Utilized During Treatment: Gait belt Activity Tolerance: Patient limited by pain Patient left: in bed;with call bell/phone within reach;with family/visitor present Nurse Communication: Mobility status PT Visit Diagnosis: Muscle weakness (generalized) (M62.81);Pain Pain - Right/Left: Right Pain - part of body: Shoulder    Time: 4540-9811 PT Time Calculation (min) (ACUTE ONLY): 17 min   Charges:   PT Evaluation $PT Eval Low Complexity: 1 Procedure     PT G Codes:   PT G-Codes **NOT FOR INPATIENT CLASS** Functional Assessment Tool Used: AM-PAC 6 Clicks Basic Mobility;Clinical judgement Functional Limitation: Mobility: Walking and moving around Mobility: Walking and Moving Around Current Status (B1478): At least 20 percent but less than 40 percent impaired, limited or restricted Mobility: Walking and Moving Around Goal Status [phone]): At least 1 percent but less than 20 percent impaired, limited or restricted    Matthew Evans PT, DPT  [phone]   Matthew Evans 10/01/2016, 3:24 PM

## 2016-10-01 NOTE — Progress Notes (Addendum)
[facility] PROGRESS NOTE  Matthew Evans AVW:[id number] DOB: 03-Sep-1950 DOA: 09/28/2016   PCP: Matthew Sites, MD     Assessment/Plan: Principal Problem:   Chest pain Active Problems:   Hypothyroid   COPD (chronic obstructive pulmonary disease) (HCC)   Post-op pain   Elevated troponin    66 y.o. male who is being seen today for the evaluation of NSTEMI at the request of Matthew Evans. Matthew Evans has a history of coronary artery disease and history of ST elevation MI in 2009 was to the RCA. As his coronary disease in the LAD and high-grade LAD stenosis and underwent cutting balloon angioplasty. He since had a number of other cardiac catheterization showing mild to moderate coronary disease. found to have an EF of 40-45% with diffuse hypokinesis on echo,cardiology consulted ,had a stress test which was abnormal  Assessment/Plan Coronary artery disease-unstable angina - serial trop flat  -asa/Plavix /cardiology  recommends heparin drip for cp this am  - tele bed, cardiac monitoring Echo shows EF of 45-50%- Severe hypokinesis of the midanteroseptal myocardium  stress test grossly abnormal, showed peri-infarct ischemia, reversible ischemia in the mid and basilar segments of the inferior wall Plan is for cardiac catheterization on Monday, make patient npo after midnight Bilateral lower extremity Doppler negative    Chronic pain/Shoulder surgery pain Continue  Robaxin Cont percocet  Hypothyroidism Cont OP synthroid 200 mcg qd No signs of hyper or hypothyroidism  Hypertension When necessary hydralazine 10 mg IV as needed for severe blood pressure Cont cozaar, toprol- XL, norvasc,   Held HCTZ  Emphysema Cont Anoro qd, when necessary nebs     DVT prophylaxsis heparin  Code Status:  Full code     Family Communication: Discussed in detail with the patient, all imaging results, lab results explained to the patient   Disposition Plan:   Cath monday       Consultants:  cardiology  Procedures:   none   Antibiotics: Anti-infectives    None         HPI/Subjective: Chest pain this morning at rest  Objective: Vitals:   09/30/16 1320 09/30/16 2016 10/01/16 0451 10/01/16 0951  BP: (!) 140/57 (!) 149/59 124/72 (!) 128/51  Pulse:  75 60   Resp: Temp: 97.6 F (36.4 C) 98.5 F (36.9 C) 97.6 F (36.4 C)   TempSrc: Oral Oral Oral   SpO2: 99% 96% 96%   Weight:      Height:        Intake/Output Summary (Last 24 hours) at 10/01/16 1004 Last data filed at 10/01/16 0600  Gross per 24 hour  Intake              240 ml  Output                0 ml  Net              240 ml    Exam:  Examination:  General exam: Appears calm and comfortable  Respiratory system: Clear to auscultation. Respiratory effort normal. Cardiovascular system: S1 & S2 heard, RRR. No JVD, murmurs, rubs, gallops or clicks. No pedal edema. Gastrointestinal system: Abdomen is nondistended, soft and nontender. No organomegaly or masses felt. Normal bowel sounds heard. Central nervous system: Alert and oriented. No focal neurological deficits. Extremities: Symmetric 5 x 5 power. Skin: No rashes, lesions or ulcers Psychiatry: Judgement and insight appear normal. Mood & affect appropriate.  Data Reviewed: I have personally reviewed following labs and imaging studies  Micro Results No results found for this or any previous visit (from the past 240 hour(s)).  Radiology Reports Dg Chest 2 View  Result Date: 09/28/2016 CLINICAL DATA:  Shortness of breath. EXAM: CHEST  2 VIEW COMPARISON:  08/29/2016 FINDINGS: Heart and mediastinal contours are within normal limits. No focal opacities or effusions. No acute bony abnormality. IMPRESSION: No active cardiopulmonary disease. Electronically Signed   By: Matthew Evans M.D.   On: 09/28/2016 10:16   Ct Angio Chest Pe W And/or Wo Contrast  Result Date: 09/28/2016 CLINICAL DATA:  Shortness of breath.  Left-sided chest pain. Recent right shoulder surgery. EXAM: CT ANGIOGRAPHY CHEST WITH CONTRAST TECHNIQUE: Multidetector CT imaging of the chest was performed using the standard protocol during bolus administration of intravenous contrast. Multiplanar CT image reconstructions and MIPs were obtained to evaluate the vascular anatomy. CONTRAST:  100 mL Isovue 370 COMPARISON:  None. FINDINGS: Cardiovascular: Negative for pulmonary embolism. Atherosclerotic calcifications at the aortic arch. Caliber of the thoracic aorta is within normal limits. Atherosclerotic calcification involving the descending thoracic aorta. Mediastinum/Nodes: Small lymph nodes throughout the mediastinum. Overall, no significant lymph node enlargement in the chest. Mild soft tissue edema in the right upper chest probably related to recent shoulder surgery. No significant pericardial fluid. Lungs/Pleura: Trachea and mainstem bronchi are patent. Small nodular densities along the right minor fissure best seen on the sagittal reformats, image 34, sequence 9. Largest nodule measures 6 mm in this area. Small amount of dependent atelectasis in the left lower lobe. No large areas of consolidation or airspace disease. Probable scarring along the posterolateral right chest related to old right rib fractures. Upper Abdomen: No acute abnormality in upper abdomen. Musculoskeletal: Bridging osteophytes in the lower thoracic spine. Multiple old right rib fractures along the posterior and lateral aspect. Review of the MIP images confirms the above findings. IMPRESSION: Negative for a pulmonary embolism. Few patchy and nodular densities along the right minor fissure are nonspecific. There is a nodular component measuring up to 6 mm. Non-contrast chest CT at 6-12 months is recommended. If the nodule is stable at time of repeat CT, then future CT at 18-24 months (from today's scan) is considered optional for low-risk patients, but is recommended for high-risk  patients. This recommendation follows the consensus statement: Guidelines for Management of Incidental Pulmonary Nodules Detected on CT Images: From the Fleischner Society 2017; Radiology 2017; 284:228-243. Old right rib fractures with adjacent scarring. Electronically Signed   By: Richarda Overlie M.D.   On: 09/28/2016 13:06   Nm Myocar Multi W/spect W/wall Motion / Ef  Result Date: 09/29/2016 CLINICAL DATA:  66 year old male with chest pain. History of myocardial infarction with coronary catheterization and angioplasty. EXAM: MYOCARDIAL IMAGING WITH SPECT (REST AND PHARMACOLOGIC-STRESS) GATED LEFT VENTRICULAR WALL MOTION STUDY LEFT VENTRICULAR EJECTION FRACTION TECHNIQUE: Standard myocardial SPECT imaging was performed after resting intravenous injection of 10 mCi Tc-54m tetrofosmin. Subsequently, intravenous infusion of Lexiscan was performed under the supervision of the Cardiology staff. At peak effect of the drug, 30 mCi Tc-75m tetrofosmin was injected intravenously and standard myocardial SPECT imaging was performed. Quantitative gated imaging was also performed to evaluate left ventricular wall motion, and estimate left ventricular ejection fraction. COMPARISON:  None. FINDINGS: Perfusion: Moderate region of mild decrease counts in the mid and apical segment anterior septal wall which partially reverses. Decreased Decrease counts within mid and basilar segment of the inferior wall which are moderate and partially reversed.  Wall Motion: Normal left ventricular wall motion. No left ventricular dilation. Left Ventricular Ejection Fraction: 31 % End diastolic volume 141 ml End systolic volume 97 ml IMPRESSION: 1. Peri-infarct ischemia surrounding a moderate infarction in the anterior septal wall. Mild reversible ischemia in the mid and basilar basilar segment of the inferior wall. 2. Septal hypokinesia.  Mild ventricular dilatation. 3. Left ventricular ejection fraction 31% 4. Non invasive risk stratification*:  High: *2012 Appropriate Use Criteria for Coronary Revascularization Focused Update: J Am Coll Cardiol. 2012;59(9):857-881. http://content.[email or web address].aspx?articleid=[email or web address] Electronically Signed   By: Genevive Bi M.D.   On: 09/29/2016 14:24     CBC  Recent Labs Lab 09/28/16 0920 09/28/16 0959 09/28/16 1452 09/30/16 0236  WBC 9.9  --  8.6 7.9  HGB 14.7 15.6 13.9 13.3  HCT 44.8 46.0 42.1 41.2  PLT 282  --  282 292  MCV 86.2  --  84.7 84.9  MCH 28.3  --  28.0 27.4  MCHC 32.8  --  33.0 32.3  RDW 12.7  --  12.7 12.7  LYMPHSABS 1.4  --   --   --   MONOABS 0.9  --   --   --   EOSABS 0.4  --   --   --   BASOSABS 0.1  --   --   --     Chemistries   Recent Labs Lab 09/28/16 0959 09/28/16 1452 09/30/16 0236  NA 142  --  138  K 3.5  --  3.6  CL 101  --  103  CO2  --   --  26  GLUCOSE 92  --  98  BUN 15  --  15  CREATININE 0.90 0.88 0.98  CALCIUM  --   --  8.5*  AST  --   --  24  ALT  --   --  15*  ALKPHOS  --   --  91  BILITOT  --   --  0.8   ------------------------------------------------------------------------------------------------------------------ estimated creatinine clearance is 83.5 mL/min (by C-G formula based on SCr of 0.98 mg/dL). ------------------------------------------------------------------------------------------------------------------ No results for input(s): HGBA1C in the last 72 hours. ------------------------------------------------------------------------------------------------------------------ No results for input(s): CHOL, HDL, LDLCALC, TRIG, CHOLHDL, LDLDIRECT in the last 72 hours. ------------------------------------------------------------------------------------------------------------------ No results for input(s): TSH, T4TOTAL, T3FREE, THYROIDAB in the last 72 hours.  Invalid input(s): FREET3 ------------------------------------------------------------------------------------------------------------------ No results for  input(s): VITAMINB12, FOLATE, FERRITIN, TIBC, IRON, RETICCTPCT in the last 72 hours.  Coagulation profile No results for input(s): INR, PROTIME in the last 168 hours.  No results for input(s): DDIMER in the last 72 hours.  Cardiac Enzymes  Recent Labs Lab 09/28/16 1452 09/28/16 1811 09/28/16 2128  TROPONINI 0.04* 0.05* 0.05*   ------------------------------------------------------------------------------------------------------------------ Invalid input(s): POCBNP   CBG: No results for input(s): GLUCAP in the last 168 hours.     Studies: Nm Myocar Multi W/spect W/wall Motion / Ef  Result Date: 09/29/2016 CLINICAL DATA:  66 year old male with chest pain. History of myocardial infarction with coronary catheterization and angioplasty. EXAM: MYOCARDIAL IMAGING WITH SPECT (REST AND PHARMACOLOGIC-STRESS) GATED LEFT VENTRICULAR WALL MOTION STUDY LEFT VENTRICULAR EJECTION FRACTION TECHNIQUE: Standard myocardial SPECT imaging was performed after resting intravenous injection of 10 mCi Tc-74m tetrofosmin. Subsequently, intravenous infusion of Lexiscan was performed under the supervision of the Cardiology staff. At peak effect of the drug, 30 mCi Tc-44m tetrofosmin was injected intravenously and standard myocardial SPECT imaging was performed. Quantitative gated imaging was also performed to evaluate left ventricular wall motion, and estimate left ventricular ejection fraction. COMPARISON:  None. FINDINGS: Perfusion: Moderate region of mild decrease counts in the mid and apical segment anterior septal wall which partially reverses. Decreased Decrease counts within mid and basilar segment of the inferior wall which are moderate and partially reversed. Wall Motion: Normal left ventricular wall motion. No left ventricular dilation. Left Ventricular Ejection Fraction: 31 % End diastolic volume 141 ml End systolic volume 97 ml IMPRESSION: 1. Peri-infarct ischemia surrounding a moderate infarction in the  anterior septal wall. Mild reversible ischemia in the mid and basilar basilar segment of the inferior wall. 2. Septal hypokinesia.  Mild ventricular dilatation. 3. Left ventricular ejection fraction 31% 4. Non invasive risk stratification*: High: *2012 Appropriate Use Criteria for Coronary Revascularization Focused Update: J Am Coll Cardiol. 2012;59(9):857-881. http://content.dementiazones.com.aspx?articleid=[email or web address] Electronically Signed   By: Genevive Bi M.D.   On: 09/29/2016 14:24      No results found for: HGBA1C Lab Results  Component Value Date   LDLCALC 66 09/01/2016   CREATININE 0.98 09/30/2016       Scheduled Meds: . amLODipine  5 mg Oral Daily  . aspirin EC  325 mg Oral Daily  . clopidogrel  75 mg Oral Daily  . heparin  5,000 Units Subcutaneous Q8H  . isosorbide mononitrate  90 mg Oral Daily  . levothyroxine  200 mcg Oral QAC breakfast  . losartan  50 mg Oral Daily  . metoprolol succinate  25 mg Oral Daily  . umeclidinium-vilanterol  1 puff Inhalation Daily   Continuous Infusions:   LOS: 0 days    Time spent: >30 MINS    Richarda Overlie  [facility] Pager [phone]. If 7PM-7AM, please contact night-coverage at [email or web address], password [facility] 10/01/2016, 10:04 AM  LOS: 0 days

## 2016-10-01 NOTE — Progress Notes (Signed)
Progress Note  Patient Name: BIRDIE BEVERIDGE Date of Encounter: 10/01/2016  Primary Cardiologist: Dr. Tresa Endo  Subjective   Primary care indicated twinges of pain He did not complain to me Wife wants tome PT.OT For right shoulder recently operated on by Dr Ranell Patrick Bone spurs and bicipital tendon  Inpatient Medications    Scheduled Meds: . amLODipine  5 mg Oral Daily  . aspirin EC  325 mg Oral Daily  . clopidogrel  75 mg Oral Daily  . heparin  5,000 Units Subcutaneous Q8H  . isosorbide mononitrate  90 mg Oral Daily  . levothyroxine  200 mcg Oral QAC breakfast  . losartan  50 mg Oral Daily  . metoprolol succinate  25 mg Oral Daily  . umeclidinium-vilanterol  1 puff Inhalation Daily   Continuous Infusions:  PRN Meds: acetaminophen, hydrALAZINE, morphine injection, ondansetron (ZOFRAN) IV, oxyCODONE-acetaminophen, zolpidem   Vital Signs    Vitals:   09/30/16 1320 09/30/16 2016 10/01/16 0451 10/01/16 0951  BP: (!) 140/57 (!) 149/59 124/72 (!) 128/51  Pulse:  75 60   Resp: Temp: 97.6 F (36.4 C) 98.5 F (36.9 C) 97.6 F (36.4 C)   TempSrc: Oral Oral Oral   SpO2: 99% 96% 96%   Weight:      Height:        Intake/Output Summary (Last 24 hours) at 10/01/16 1101 Last data filed at 10/01/16 0600  Gross per 24 hour  Intake              240 ml  Output                0 ml  Net              240 ml   Filed Weights   09/28/16 0919 09/28/16 1511  Weight: 95.3 kg (210 lb) 96.4 kg (212 lb 8 oz)    Telemetry    NSR - Personally Reviewed 10/01/2016   ECG    Sinus bradycardia 56 bpm, LBBB, LAD - Personally Reviewed  Physical Exam   Affect appropriate Healthy:  appears stated age HEENT: normal Neck supple with no adenopathy JVP normal no bruits no thyromegaly Lungs clear with no wheezing and good diaphragmatic motion Heart:  S1/S2 no murmur, no rub, gallop or click PMI normal Abdomen: benighn, BS positve, no tenderness, no AAA no bruit.  No HSM or  HJR Distal pulses intact with no bruits No edema Neuro non-focal Skin warm and dry Dressing on right shoulder decrease ROM    Labs    Chemistry  Recent Labs Lab 09/28/16 0959 09/28/16 1452 09/30/16 0236  NA 142  --  138  K 3.5  --  3.6  CL 101  --  103  CO2  --   --  26  GLUCOSE 92  --  98  BUN 15  --  15  CREATININE 0.90 0.88 0.98  CALCIUM  --   --  8.5*  PROT  --   --  5.9*  ALBUMIN  --   --  3.0*  AST  --   --  24  ALT  --   --  15*  ALKPHOS  --   --  91  BILITOT  --   --  0.8  GFRNONAA  --  >60 >60  GFRAA  --  >60 >60  ANIONGAP  --   --  9     Hematology  Recent Labs Lab 09/28/16 0920 09/28/16  1610 09/28/16 1452 09/30/16 0236  WBC 9.9  --  8.6 7.9  RBC 5.20  --  4.97 4.85  HGB 14.7 15.6 13.9 13.3  HCT 44.8 46.0 42.1 41.2  MCV 86.2  --  84.7 84.9  MCH 28.3  --  28.0 27.4  MCHC 32.8  --  33.0 32.3  RDW 12.7  --  12.7 12.7  PLT 282  --  282 292    Cardiac Enzymes  Recent Labs Lab 09/28/16 1452 09/28/16 1811 09/28/16 2128  TROPONINI 0.04* 0.05* 0.05*     Recent Labs Lab 09/28/16 0957  TROPIPOC 0.00     BNP  Recent Labs Lab 09/28/16 0920  BNP 185.2*     DDimer No results for input(s): DDIMER in the last 168 hours.   Radiology    Nm Myocar Multi W/spect W/wall Motion / Ef  Result Date: 09/29/2016 CLINICAL DATA:  66 year old male with chest pain. History of myocardial infarction with coronary catheterization and angioplasty. EXAM: MYOCARDIAL IMAGING WITH SPECT (REST AND PHARMACOLOGIC-STRESS) GATED LEFT VENTRICULAR WALL MOTION STUDY LEFT VENTRICULAR EJECTION FRACTION TECHNIQUE: Standard myocardial SPECT imaging was performed after resting intravenous injection of 10 mCi Tc-68m tetrofosmin. Subsequently, intravenous infusion of Lexiscan was performed under the supervision of the Cardiology staff. At peak effect of the drug, 30 mCi Tc-73m tetrofosmin was injected intravenously and standard myocardial SPECT imaging was performed.  Quantitative gated imaging was also performed to evaluate left ventricular wall motion, and estimate left ventricular ejection fraction. COMPARISON:  None. FINDINGS: Perfusion: Moderate region of mild decrease counts in the mid and apical segment anterior septal wall which partially reverses. Decreased Decrease counts within mid and basilar segment of the inferior wall which are moderate and partially reversed. Wall Motion: Normal left ventricular wall motion. No left ventricular dilation. Left Ventricular Ejection Fraction: 31 % End diastolic volume 141 ml End systolic volume 97 ml IMPRESSION: 1. Peri-infarct ischemia surrounding a moderate infarction in the anterior septal wall. Mild reversible ischemia in the mid and basilar basilar segment of the inferior wall. 2. Septal hypokinesia.  Mild ventricular dilatation. 3. Left ventricular ejection fraction 31% 4. Non invasive risk stratification*: High: *2012 Appropriate Use Criteria for Coronary Revascularization Focused Update: J Am Coll Cardiol. 2012;59(9):857-881. http://content.dementiazones.com.aspx?articleid=1201161 Electronically Signed   By: Genevive Bi M.D.   On: 09/29/2016 14:24    Cardiac Studies   Stress test and echo pending.   Patient Profile     66 y.o. male a history of COPD, hypertension, hypothyroidism, hyperlipidemia, GERD and coronary artery disease and history of ST elevation MI with RCA stent 2009 and staged cutting balloon to a high grade LAD stenosis whitch involved multiple branches and stenting was not done to reduce potential for jailing of his multiple branches and stenting was not done to reduce potential for jailing of his multiple branches. 2010 he was found to have 50-60% stenosis beyond his RCA stent in the LAD intervention site remained patent. He has a documented stable abdominal aortic aneurysm. Presented for increased shortness of breath and very brief chest pain. Right shoulder surgery was done  Monday.  Assessment & Plan    Chest pain -abnormal myovue with inferior ischemia Known CAD post intervention LAD and moderate residual disease Troponin Minimal elevation with ECG LBBB.  For cath on Monday Continue asa/plavix and beta blocker  Harding 1:30  Will order PT for shoulder    Signed, Charlton Haws, MD  10/01/2016, 11:01 AM

## 2016-10-02 ENCOUNTER — Encounter (HOSPITAL_COMMUNITY)
Admission: EM | Disposition: A | Discharge status: Home / Self Care | Payer: Self-pay | Source: Home / Self Care | Attending: Internal Medicine

## 2016-10-02 DIAGNOSIS — I25110 Atherosclerotic heart disease of native coronary artery with unstable angina pectoris: Principal | ICD-10-CM

## 2016-10-02 DIAGNOSIS — R9439 Abnormal result of other cardiovascular function study: Secondary | ICD-10-CM

## 2016-10-02 DIAGNOSIS — I200 Unstable angina: Secondary | ICD-10-CM

## 2016-10-02 HISTORY — PX: LEFT HEART CATH AND CORONARY ANGIOGRAPHY: CATH118249

## 2016-10-02 LAB — CBC
HCT: 40.3 % (ref 39.0–52.0)
Hemoglobin: 12.8 g/dL — ABNORMAL LOW (ref 13.0–17.0)
MCH: 27.4 pg (ref 26.0–34.0)
MCHC: 31.8 g/dL (ref 30.0–36.0)
MCV: 86.1 fL (ref 78.0–100.0)
PLATELETS: 267 10*3/uL (ref 150–400)
RBC: 4.68 MIL/uL (ref 4.22–5.81)
RDW: 12.9 % (ref 11.5–15.5)
WBC: 6.6 10*3/uL (ref 4.0–10.5)

## 2016-10-02 LAB — BASIC METABOLIC PANEL
Anion gap: 5 (ref 5–15)
BUN: 11 mg/dL (ref 6–20)
CO2: 25 mmol/L (ref 22–32)
CREATININE: 0.93 mg/dL (ref 0.61–1.24)
Calcium: 8.3 mg/dL — ABNORMAL LOW (ref 8.9–10.3)
Chloride: 108 mmol/L (ref 101–111)
Glucose, Bld: 103 mg/dL — ABNORMAL HIGH (ref 65–99)
Potassium: 4.4 mmol/L (ref 3.5–5.1)
SODIUM: 138 mmol/L (ref 135–145)

## 2016-10-02 LAB — PROTIME-INR
INR: 0.98
Prothrombin Time: 13 seconds (ref 11.4–15.2)

## 2016-10-02 LAB — HEPARIN LEVEL (UNFRACTIONATED): Heparin Unfractionated: 0.46 IU/mL (ref 0.30–0.70)

## 2016-10-02 LAB — T4, FREE: Free T4: 2.24 ng/dL — ABNORMAL HIGH (ref 0.61–1.12)

## 2016-10-02 LAB — TSH: TSH: <0.01 u[IU]/mL — ABNORMAL LOW (ref 0.350–4.500)

## 2016-10-02 SURGERY — LEFT HEART CATH AND CORONARY ANGIOGRAPHY
Anesthesia: LOCAL

## 2016-10-02 MED ORDER — IOPAMIDOL (ISOVUE-370) INJECTION 76%
INTRAVENOUS | Status: DC | PRN
  Administered 2016-10-02: 60 mL via INTRA_ARTERIAL

## 2016-10-02 MED ORDER — HEPARIN SODIUM (PORCINE) 1000 UNIT/ML IJ SOLN
INTRAMUSCULAR | Status: AC
  Filled 2016-10-02: qty 1

## 2016-10-02 MED ORDER — LIDOCAINE HCL 1 % IJ SOLN
INTRAMUSCULAR | Status: AC
  Filled 2016-10-02: qty 20

## 2016-10-02 MED ORDER — FENTANYL CITRATE (PF) 100 MCG/2ML IJ SOLN
INTRAMUSCULAR | Status: AC
  Filled 2016-10-02: qty 2

## 2016-10-02 MED ORDER — HEPARIN SODIUM (PORCINE) 1000 UNIT/ML IJ SOLN
INTRAMUSCULAR | Status: DC | PRN
  Administered 2016-10-02: 5000 [IU] via INTRAVENOUS

## 2016-10-02 MED ORDER — SODIUM CHLORIDE 0.9% FLUSH: 3.0000 mL | INTRAVENOUS | Status: DC | PRN

## 2016-10-02 MED ORDER — FENTANYL CITRATE (PF) 100 MCG/2ML IJ SOLN
INTRAMUSCULAR | Status: DC | PRN
  Administered 2016-10-02: 25 ug via INTRAVENOUS

## 2016-10-02 MED ORDER — SODIUM CHLORIDE 0.9% FLUSH
3.0000 mL | Freq: Two times a day (BID) | INTRAVENOUS | Status: DC
  Administered 2016-10-02: 3 mL via INTRAVENOUS

## 2016-10-02 MED ORDER — HEPARIN (PORCINE) IN NACL 2-0.9 UNIT/ML-% IJ SOLN
INTRAMUSCULAR | Status: AC
  Filled 2016-10-02: qty 1000

## 2016-10-02 MED ORDER — MIDAZOLAM HCL 2 MG/2ML IJ SOLN
INTRAMUSCULAR | Status: DC | PRN
  Administered 2016-10-02: 2 mg via INTRAVENOUS

## 2016-10-02 MED ORDER — IOPAMIDOL (ISOVUE-370) INJECTION 76%
INTRAVENOUS | Status: AC
  Filled 2016-10-02: qty 100

## 2016-10-02 MED ORDER — HEPARIN (PORCINE) IN NACL 2-0.9 UNIT/ML-% IJ SOLN
INTRAMUSCULAR | Status: DC | PRN
  Administered 2016-10-02: 10 mL via INTRA_ARTERIAL

## 2016-10-02 MED ORDER — SODIUM CHLORIDE 0.9 % IV SOLN: 250.0000 mL | INTRAVENOUS | Status: DC | PRN

## 2016-10-02 MED ORDER — SODIUM CHLORIDE 0.9 % IV SOLN: INTRAVENOUS | Status: AC

## 2016-10-02 MED ORDER — VERAPAMIL HCL 2.5 MG/ML IV SOLN
INTRAVENOUS | Status: AC
  Filled 2016-10-02: qty 2

## 2016-10-02 MED ORDER — MIDAZOLAM HCL 2 MG/2ML IJ SOLN
INTRAMUSCULAR | Status: AC
  Filled 2016-10-02: qty 2

## 2016-10-02 MED ORDER — LIDOCAINE HCL (PF) 1 % IJ SOLN
INTRAMUSCULAR | Status: DC | PRN
  Administered 2016-10-02: 2 mL via INTRADERMAL

## 2016-10-02 MED ORDER — HEPARIN (PORCINE) IN NACL 2-0.9 UNIT/ML-% IJ SOLN
INTRAMUSCULAR | Status: DC | PRN
  Administered 2016-10-02: 1000 mL

## 2016-10-02 SURGICAL SUPPLY — 9 items
CATH INFINITI 5FR MULTPACK ANG (CATHETERS) ×1 IMPLANT
DEVICE RAD COMP TR BAND LRG (VASCULAR PRODUCTS) ×1 IMPLANT
GLIDESHEATH SLEND A-KIT 6F 22G (SHEATH) ×1 IMPLANT
GUIDEWIRE INQWIRE 1.5J.035X260 (WIRE) IMPLANT
INQWIRE 1.5J .035X260CM (WIRE) ×2
KIT HEART LEFT (KITS) ×2 IMPLANT
PACK CARDIAC CATHETERIZATION (CUSTOM PROCEDURE TRAY) ×2 IMPLANT
TRANSDUCER W/STOPCOCK (MISCELLANEOUS) ×2 IMPLANT
TUBING CIL FLEX 10 FLL-RA (TUBING) ×3 IMPLANT

## 2016-10-02 NOTE — Telephone Encounter (Signed)
Rx has been sent to the pharmacy electronically. ° °

## 2016-10-02 NOTE — Interval H&P Note (Signed)
History and Physical Interval Note:  10/02/2016 3:41 PM  Matthew Evans  has presented today for surgery, with the diagnosis of CP - possible unstable Angina with abnormal Nuc ST.  The various methods of treatment have been discussed with the patient and family. After consideration of risks, benefits and other options for treatment, the patient has consented to  Procedure(s): Left Heart Cath and Coronary Angiography (N/A) with possible Percutaneous Coronary Intervention (PCI) as a surgical intervention .  The patient's history has been reviewed, patient examined, no change in status, stable for surgery.  I have reviewed the patient's chart and labs.  Questions were answered to the patient's satisfaction.    Cath Lab Visit (complete for each Cath Lab visit)  Clinical Evaluation Leading to the Procedure:   ACS: Yes.    Non-ACS:    Anginal Classification: CCS IV  Anti-ischemic medical therapy: Maximal Therapy (2 or more classes of medications)  Non-Invasive Test Results: High-risk stress test findings: cardiac mortality >3%/year  Prior CABG: No previous CABG   Bryan Lemma

## 2016-10-02 NOTE — Progress Notes (Signed)
     SUBJECTIVE: Only c/o post-operative right shoulder pain. Occasional sharp chest pains. No dyspnea.   Tele: sinus  BP 119/65 (BP Location: Left Arm)   Pulse 62   Temp 97.9 F (36.6 C) (Oral)   Resp 18   Ht 5' 8" (1.727 m)   Wt 212 lb 8 oz (96.4 kg)   SpO2 99%   BMI 32.31 kg/m   Intake/Output Summary (Last 24 hours) at 10/02/16 0816 Last data filed at 10/02/16 0300  Gross per 24 hour  Intake           360.06 ml  Output                0 ml  Net           360.06 ml    PHYSICAL EXAM General: Well developed, well nourished, in no acute distress. Alert and oriented x 3.  Psych:  Good affect, responds appropriately Neck: No JVD. No masses noted.  Lungs: Clear bilaterally with no wheezes or rhonci noted.  Heart: RRR with no murmurs noted. Abdomen: Bowel sounds are present. Soft, non-tender.  Extremities: No lower extremity edema.   LABS: Basic Metabolic Panel:  Recent Labs  09/30/16 0236  NA 138  K 3.6  CL 103  CO2 26  GLUCOSE 98  BUN 15  CREATININE 0.98  CALCIUM 8.5*   CBC:  Recent Labs  09/30/16 0236 10/02/16 0158  WBC 7.9 6.6  HGB 13.3 12.8*  HCT 41.2 40.3  MCV 84.9 86.1  PLT 292 267   Current Meds: . amLODipine  5 mg Oral Daily  . aspirin EC  325 mg Oral Daily  . clopidogrel  75 mg Oral Daily  . isosorbide mononitrate  90 mg Oral Daily  . levothyroxine  200 mcg Oral QAC breakfast  . losartan  50 mg Oral Daily  . metoprolol succinate  25 mg Oral Daily  . sodium chloride flush  3 mL Intravenous Q12H  . umeclidinium-vilanterol  1 puff Inhalation Daily   Echo 09/29/16: Left ventricle: The cavity size was normal. Wall thickness was   increased in a pattern of mild LVH. Systolic function was mildly   reduced. The estimated ejection fraction was in the range of 45%   to 50%. Severe hypokinesis of the midanteroseptal myocardium.   Doppler parameters are consistent with abnormal left ventricular   relaxation (grade 1 diastolic dysfunction). -  Aortic valve: Valve area (Vmax): 1.93 cm^2.   ASSESSMENT AND PLAN: 66 yo male with history of CAD with prior stenting of the RCA and angioplasty of the LAD admitted with chest pain, mild troponin elevation. Echo 09/29/16 with LVEF 45-50%. Nuclear stress test with possible ischemia in the inferior wall and probable scar in the anteroseptal wall.   1. CAD/Unstable angina: Pt admitted with chest pain, mild troponin elevation and abnormal nuclear stress test. Plans for cardiac cath today. I have reviewed the procedure again with the patient. He has no questions. Continue ASA, Plavix, Imdur, beta blocker, ARB. He has not tolerated statins in the past. He would prefer left arm access today for cath given the pain in his right shoulder post op.    Matthew Evans  4/30/20188:16 AM  

## 2016-10-02 NOTE — Progress Notes (Deleted)
PT Cancellation Note  Patient Details Name: Matthew Evans MRN: [id number] DOB: August 16, 1950   Cancelled Treatment:    Reason Eval/Treat Not Completed: Patient at procedure or test/unavailable   Senaida Chilcote B Benay Pomeroy 10/02/2016, 7:23 AM  Delaney Meigs, PT [phone]

## 2016-10-02 NOTE — Progress Notes (Signed)
Physical Therapy Treatment Patient Details Name: Matthew Evans MRN: [id number] DOB: 06-15-1950 Today's Date: 10/02/2016    History of Present Illness Pt is a 66 yo male admitted through ED on 09/28/16 with chest pain and elevated troponin. PMH significant fot MI, Lupus, CAD, emphysema, COPD, hypothyroid, HTN, AAA, CAD. Pt is scheduled for cardiac catheterization 10/02/16. Pt s/p shoulder surgery partial bicep tendon repair and removeal of bone spurs on 09/25/16. Pt was slated to start OP therapy thursday 09/28/16, but was here in the hospital.     PT Comments    Matthew Evans is very pleasant and moves well without SOB or difficulty with hall ambulation. He does report SOB if distance were to increase but await cath later today. Pt educated for shoulder ROM and HEP. Pt educated for AAROM, pendulum and no Abduction with pt verbalizing understanding but having difficulty relaxing RUE for assisted ROM. Will follow post cath to maximize independence and adherence to HEP.   HR 83-90   Follow Up Recommendations  Outpatient PT     Equipment Recommendations  None recommended by PT    Recommendations for Other Services       Precautions / Restrictions Precautions Precautions: Shoulder Type of Shoulder Precautions: AAROM, isometrics, No abduction Restrictions RUE Weight Bearing: Non weight bearing    Mobility  Bed Mobility Overal bed mobility: Independent                Transfers Overall transfer level: Modified independent                  Ambulation/Gait Ambulation/Gait assistance: Modified independent (Device/Increase time) Ambulation Distance (Feet): 550 Feet Assistive device: None Gait Pattern/deviations: WFL(Within Functional Limits)   Gait velocity interpretation: at or above normal speed for age/gender General Gait Details: limited arm swing RUE   Stairs            Wheelchair Mobility    Modified Rankin (Stroke Patients Only)       Balance Overall  balance assessment: No apparent balance deficits (not formally assessed)                                          Cognition Arousal/Alertness: Awake/alert Behavior During Therapy: WFL for tasks assessed/performed Overall Cognitive Status: Within Functional Limits for tasks assessed                                        Exercises General Exercises - Upper Extremity Shoulder Flexion: AAROM;10 reps;Seated;Other (comment);PROM;Right (isometric and PROM) Shoulder Extension: AAROM;10 reps;Seated;Other (comment);PROM;Right (isometric) Elbow Flexion: AAROM;10 reps;Seated;Right Elbow Extension: AAROM;Seated;10 reps;Right Other Exercises Other Exercises: AAROM Right shoulder external rotation x 10 reps    General Comments        Pertinent Vitals/Pain Faces Pain Scale: Hurts little more Pain Location: Right shoulder with mobility.  Pain Descriptors / Indicators: Sore Pain Intervention(s): Limited activity within patient's tolerance    Home Living                      Prior Function            PT Goals (current goals can now be found in the care plan section) Progress towards PT goals: Progressing toward goals    Frequency    Min 2X/week  PT Plan Frequency needs to be updated    Co-evaluation              AM-PAC PT "6 Clicks" Daily Activity  Outcome Measure  Difficulty turning over in bed (including adjusting bedclothes, sheets and blankets)?: None Difficulty moving from lying on back to sitting on the side of the bed? : None Difficulty sitting down on and standing up from a chair with arms (e.g., wheelchair, bedside commode, etc,.)?: None Help needed moving to and from a bed to chair (including a wheelchair)?: None Help needed walking in hospital room?: None Help needed climbing 3-5 steps with a railing? : None 6 Click Score: 24    End of Session   Activity Tolerance: Patient tolerated treatment well Patient  left: in bed;with call bell/phone within reach;with family/visitor present Nurse Communication: Mobility status PT Visit Diagnosis: Muscle weakness (generalized) (M62.81);Pain Pain - Right/Left: Right Pain - part of body: Shoulder     Time: 1610-9604 PT Time Calculation (min) (ACUTE ONLY): 15 min  Charges:  $Therapeutic Exercise: 8-22 mins                    G Codes:       Delaney Meigs, PT [phone]  Letonia Stead B Sahar Ryback 10/02/2016, 2:25 PM

## 2016-10-02 NOTE — Progress Notes (Signed)
ANTICOAGULATION CONSULT NOTE - Initial Consult  Pharmacy Consult for Heparin Indication: chest pain/ACS  Allergies  Allergen Reactions  . Niaspan [Niacin] Rash  . Septra [Sulfamethoxazole-Trimethoprim] Rash  . Simcor [Niacin-Simvastatin Er] Rash  . Zocor [Simvastatin] Rash    Patient Measurements: Height:  (172.7 cm) Weight: 212 lb 8 oz (96.4 kg) IBW/kg (Calculated) : 68.4 Heparin Dosing Weight: 88.7kg  Vital Signs: Temp: 97.9 F (36.6 C) (04/30 0358) Temp Source: Oral (04/30 0358) BP: 119/65 (04/30 0358) Pulse Rate: 62 (04/30 0358)  Labs:  Recent Labs  09/30/16 0236 10/01/16 1743 10/02/16 0158  HGB 13.3  --  12.8*  HCT 41.2  --  40.3  PLT 292  --  267  LABPROT  --   --  13.0  INR  --   --  0.98  HEPARINUNFRC  --  0.34 0.46  CREATININE 0.98  --   --     Estimated Creatinine Clearance: 83.5 mL/min (by C-G formula based on SCr of 0.98 mg/dL).  Assessment: 66yom with abnormal stress test, troponin elevation, and chest pain to begin heparin with plan for cath this afternoon. No anticoagulants pta. Heparin level therapeutic on 1250 units/hr. CBC stable, No bleeding noted per chart.   Goal of Therapy:  Heparin level 0.3-0.7 units/ml Monitor platelets by anticoagulation protocol: Yes   Plan:  1) Continue heparin at 1250 units/hr 2) f/u after cath  Bayard Hugger, PharmD, BCPS  Clinical Pharmacist  Pager: [phone]   10/02/2016,8:47 AM

## 2016-10-02 NOTE — H&P (View-Only) (Signed)
     SUBJECTIVE: Only c/o post-operative right shoulder pain. Occasional sharp chest pains. No dyspnea.   Tele: sinus  BP 119/65 (BP Location: Left Arm)   Pulse 62   Temp 97.9 F (36.6 C) (Oral)   Resp 18   Ht  (1.727 m)   Wt 212 lb 8 oz (96.4 kg)   SpO2 99%   BMI 32.31 kg/m   Intake/Output Summary (Last 24 hours) at 10/02/16 0816 Last data filed at 10/02/16 0300  Gross per 24 hour  Intake           360.06 ml  Output                0 ml  Net           360.06 ml    PHYSICAL EXAM General: Well developed, well nourished, in no acute distress. Alert and oriented x 3.  Psych:  Good affect, responds appropriately Neck: No JVD. No masses noted.  Lungs: Clear bilaterally with no wheezes or rhonci noted.  Heart: RRR with no murmurs noted. Abdomen: Bowel sounds are present. Soft, non-tender.  Extremities: No lower extremity edema.   LABS: Basic Metabolic Panel:  Recent Labs  16/10/96 0236  NA 138  K 3.6  CL 103  CO2 26  GLUCOSE 98  BUN 15  CREATININE 0.98  CALCIUM 8.5*   CBC:  Recent Labs  09/30/16 0236 10/02/16 0158  WBC 7.9 6.6  HGB 13.3 12.8*  HCT 41.2 40.3  MCV 84.9 86.1  PLT 292 267   Current Meds: . amLODipine  5 mg Oral Daily  . aspirin EC  325 mg Oral Daily  . clopidogrel  75 mg Oral Daily  . isosorbide mononitrate  90 mg Oral Daily  . levothyroxine  200 mcg Oral QAC breakfast  . losartan  50 mg Oral Daily  . metoprolol succinate  25 mg Oral Daily  . sodium chloride flush  3 mL Intravenous Q12H  . umeclidinium-vilanterol  1 puff Inhalation Daily   Echo 09/29/16: Left ventricle: The cavity size was normal. Wall thickness was   increased in a pattern of mild LVH. Systolic function was mildly   reduced. The estimated ejection fraction was in the range of 45%   to 50%. Severe hypokinesis of the midanteroseptal myocardium.   Doppler parameters are consistent with abnormal left ventricular   relaxation (grade 1 diastolic dysfunction). -  Aortic valve: Valve area (Vmax): 1.93 cm^2.   ASSESSMENT AND PLAN: 66 yo male with history of CAD with prior stenting of the RCA and angioplasty of the LAD admitted with chest pain, mild troponin elevation. Echo 09/29/16 with LVEF 45-50%. Nuclear stress test with possible ischemia in the inferior wall and probable scar in the anteroseptal wall.   1. CAD/Unstable angina: Pt admitted with chest pain, mild troponin elevation and abnormal nuclear stress test. Plans for cardiac cath today. I have reviewed the procedure again with the patient. He has no questions. Continue ASA, Plavix, Imdur, beta blocker, ARB. He has not tolerated statins in the past. He would prefer left arm access today for cath given the pain in his right shoulder post op.    Verne Carrow  4/30/20188:16 AM

## 2016-10-02 NOTE — Consult Note (Signed)
           [facility] CM Primary Care Navigator  10/02/2016  Matthew Evans 07-12-1950 [id number]    Went to see patientat the bedside to identify possible discharge needsbut staffreports that patient is in Cath Lab for a procedure at this time.   Will attempt to meet with patient at another time when he is available in the room.   For questions, please contact:  Wyatt Haste, BSN, RN- [facility] Primary Care Navigator  Telephone: [phone] [facility]

## 2016-10-02 NOTE — Care Management Note (Signed)
Case Management Note Donn Pierini RN, BSN Unit 2W-Case Manager [phone]  Patient Details  Name: Matthew Evans MRN: [id number] Date of Birth: 1950-09-09  Subjective/Objective:   Pt admitted with chest pain, abnormal stress and plan for cardiac cath today                Action/Plan: PTA pt lived at home with wife- s/p shoulder surgery recently- per PT eval recommendations for outpt therapy- pt was already setup to start outpt therapy and will f/u with outpt plans as before.   Expected Discharge Date:                  Expected Discharge Plan:  Home/Self Care  In-House Referral:     Discharge planning Services  CM Consult  Post Acute Care Choice:  NA Choice offered to:  NA  DME Arranged:    DME Agency:     HH Arranged:    HH Agency:     Status of Service:  Completed, signed off  If discussed at [employer] of Stay Meetings, dates discussed:    Additional Comments:  Darrold Span, RN 10/02/2016, 4:48 PM

## 2016-10-02 NOTE — Progress Notes (Signed)
[facility] PROGRESS NOTE  Matthew Evans:[id number] DOB: May 23, 1951 DOA: 09/28/2016   PCP: Matthew Sites, MD     Assessment/Plan: Principal Problem:   Chest pain Active Problems:   Hypothyroid   COPD (chronic obstructive pulmonary disease) (HCC)   Post-op pain   Elevated troponin    66 y.o. male who is being seen today for the evaluation of NSTEMI at the request of Matthew Evans. [name] has a history of coronary artery disease and history of ST elevation MI in 2009 was to the RCA. As his coronary disease in the LAD and high-grade LAD stenosis and underwent cutting balloon angioplasty. He since had a number of other cardiac catheterization showing mild to moderate coronary disease. found to have an EF of 40-45% with diffuse hypokinesis on echo,cardiology consulted ,had a stress test which was abnormal  Assessment/Plan Coronary artery disease-unstable angina - serial trop flat  -asa/Plavix /heparin drip - tele bed, cardiac monitoring-stable Echo shows EF of 45-50%- Severe hypokinesis of the midanteroseptal myocardium  stress test grossly abnormal, showed peri-infarct ischemia, reversible ischemia in the mid and basilar segments of the inferior wall Plan is for cardiac catheterization today Bilateral lower extremity Doppler negative    Chronic pain/Shoulder surgery pain Continue  Robaxin Cont percocet  Hypothyroidism Most recent TSH less than 0.01 Repeat TSH and free T4 Synthroid 125 g a day at discharge    Hypertension When necessary hydralazine 10 mg IV as needed for severe blood pressure Cont cozaar, toprol- XL, norvasc,   Held HCTZ  Emphysema Cont Anoro qd, when necessary nebs     DVT prophylaxsis heparin  Code Status:  Full code     Family Communication: Discussed in detail with the patient, all imaging results, lab results explained to the patient   Disposition Plan:   Cath today       Consultants:  cardiology  Procedures:   none   Antibiotics: Anti-infectives    None         HPI/Subjective: Chest pain this morning at rest  Objective: Vitals:   10/01/16 1307 10/01/16 1952 10/02/16 0358 10/02/16 0929  BP: (!) 126/47 (!) 139/57 119/65 (!) 122/54  Pulse: (!) 58 69 62   Resp:  18 18   Temp: 97.9 F (36.6 C) 98.1 F (36.7 C) 97.9 F (36.6 C)   TempSrc: Oral Oral Oral   SpO2: 97% 98% 99%   Weight:      Height:        Intake/Output Summary (Last 24 hours) at 10/02/16 1000 Last data filed at 10/02/16 0932  Gross per 24 hour  Intake           363.06 ml  Output                0 ml  Net           363.06 ml    Exam:  Examination:  General exam: Appears calm and comfortable  Respiratory system: Clear to auscultation. Respiratory effort normal. Cardiovascular system: S1 & S2 heard, RRR. No JVD, murmurs, rubs, gallops or clicks. No pedal edema. Gastrointestinal system: Abdomen is nondistended, soft and nontender. No organomegaly or masses felt. Normal bowel sounds heard. Central nervous system: Alert and oriented. No focal neurological deficits. Extremities: Symmetric 5 x 5 power. Skin: No rashes, lesions or ulcers Psychiatry: Judgement and insight appear normal. Mood & affect appropriate.     Data Reviewed: I have personally reviewed following labs and imaging  studies  Micro Results No results found for this or any previous visit (from the past 240 hour(s)).  Radiology Reports Dg Chest 2 View  Result Date: 09/28/2016 CLINICAL DATA:  Shortness of breath. EXAM: CHEST  2 VIEW COMPARISON:  08/29/2016 FINDINGS: Heart and mediastinal contours are within normal limits. No focal opacities or effusions. No acute bony abnormality. IMPRESSION: No active cardiopulmonary disease. Electronically Signed   By: Charlett Nose M.D.   On: 09/28/2016 10:16   Ct Angio Chest Pe W And/or Wo Contrast  Result Date: 09/28/2016 CLINICAL DATA:  Shortness of breath.  Left-sided chest pain. Recent right shoulder surgery. EXAM: CT ANGIOGRAPHY CHEST WITH CONTRAST TECHNIQUE: Multidetector CT imaging of the chest was performed using the standard protocol during bolus administration of intravenous contrast. Multiplanar CT image reconstructions and MIPs were obtained to evaluate the vascular anatomy. CONTRAST:  100 mL Isovue 370 COMPARISON:  None. FINDINGS: Cardiovascular: Negative for pulmonary embolism. Atherosclerotic calcifications at the aortic arch. Caliber of the thoracic aorta is within normal limits. Atherosclerotic calcification involving the descending thoracic aorta. Mediastinum/Nodes: Small lymph nodes throughout the mediastinum. Overall, no significant lymph node enlargement in the chest. Mild soft tissue edema in the right upper chest probably related to recent shoulder surgery. No significant pericardial fluid. Lungs/Pleura: Trachea and mainstem bronchi are patent. Small nodular densities along the right minor fissure best seen on the sagittal reformats, image 34, sequence 9. Largest nodule measures 6 mm in this area. Small amount of dependent atelectasis in the left lower lobe. No large areas of consolidation or airspace disease. Probable scarring along the posterolateral right chest related to old right rib fractures. Upper Abdomen: No acute abnormality in upper abdomen. Musculoskeletal: Bridging osteophytes in the lower thoracic spine. Multiple old right rib fractures along the posterior and lateral aspect. Review of the MIP images confirms the above findings. IMPRESSION: Negative for a pulmonary embolism. Few patchy and nodular densities along the right minor fissure are nonspecific. There is a nodular component measuring up to 6 mm. Non-contrast chest CT at 6-12 months is recommended. If the nodule is stable at time of repeat CT, then future CT at 18-24 months (from today's scan) is considered optional for low-risk patients, but is recommended for high-risk  patients. This recommendation follows the consensus statement: Guidelines for Management of Incidental Pulmonary Nodules Detected on CT Images: From the Fleischner Society 2017; Radiology 2017; 284:228-243. Old right rib fractures with adjacent scarring. Electronically Signed   By: Richarda Overlie M.D.   On: 09/28/2016 13:06   Nm Myocar Multi W/spect W/wall Motion / Ef  Result Date: 09/29/2016 CLINICAL DATA:  66 year old male with chest pain. History of myocardial infarction with coronary catheterization and angioplasty. EXAM: MYOCARDIAL IMAGING WITH SPECT (REST AND PHARMACOLOGIC-STRESS) GATED LEFT VENTRICULAR WALL MOTION STUDY LEFT VENTRICULAR EJECTION FRACTION TECHNIQUE: Standard myocardial SPECT imaging was performed after resting intravenous injection of 10 mCi Tc-42m tetrofosmin. Subsequently, intravenous infusion of Lexiscan was performed under the supervision of the Cardiology staff. At peak effect of the drug, 30 mCi Tc-54m tetrofosmin was injected intravenously and standard myocardial SPECT imaging was performed. Quantitative gated imaging was also performed to evaluate left ventricular wall motion, and estimate left ventricular ejection fraction. COMPARISON:  None. FINDINGS: Perfusion: Moderate region of mild decrease counts in the mid and apical segment anterior septal wall which partially reverses. Decreased Decrease counts within mid and basilar segment of the inferior wall which are moderate and partially reversed. Wall Motion: Normal left ventricular wall motion. No left ventricular  dilation. Left Ventricular Ejection Fraction: 31 % End diastolic volume 141 ml End systolic volume 97 ml IMPRESSION: 1. Peri-infarct ischemia surrounding a moderate infarction in the anterior septal wall. Mild reversible ischemia in the mid and basilar basilar segment of the inferior wall. 2. Septal hypokinesia.  Mild ventricular dilatation. 3. Left ventricular ejection fraction 31% 4. Non invasive risk stratification*:  High: *2012 Appropriate Use Criteria for Coronary Revascularization Focused Update: J Am Coll Cardiol. 2012;59(9):857-881. http://[email or web address] Electronically Signed   By: Genevive Bi M.D.   On: 09/29/2016 14:24     CBC  Recent Labs Lab 09/28/16 0920 09/28/16 0959 09/28/16 1452 09/30/16 0236 10/02/16 0158  WBC 9.9  --  8.6 7.9 6.6  HGB 14.7 15.6 13.9 13.3 12.8*  HCT 44.8 46.0 42.1 41.2 40.3  PLT 282  --  282 292 267  MCV 86.2  --  84.7 84.9 86.1  MCH 28.3  --  28.0 27.4 27.4  MCHC 32.8  --  33.0 32.3 31.8  RDW 12.7  --  12.7 12.7 12.9  LYMPHSABS 1.4  --   --   --   --   MONOABS 0.9  --   --   --   --   EOSABS 0.4  --   --   --   --   BASOSABS 0.1  --   --   --   --     Chemistries   Recent Labs Lab 09/28/16 0959 09/28/16 1452 09/30/16 0236 10/02/16 0846  NA 142  --  138 138  K 3.5  --  3.6 4.4  CL 101  --  103 108  CO2  --   --  26 25  GLUCOSE 92  --  98 103*  BUN 15  --  15 11  CREATININE 0.90 0.88 0.98 0.93  CALCIUM  --   --  8.5* 8.3*  AST  --   --  24  --   ALT  --   --  15*  --   ALKPHOS  --   --  91  --   BILITOT  --   --  0.8  --    ------------------------------------------------------------------------------------------------------------------ estimated creatinine clearance is 88 mL/min (by C-G formula based on SCr of 0.93 mg/dL). ------------------------------------------------------------------------------------------------------------------ No results for input(s): HGBA1C in the last 72 hours. ------------------------------------------------------------------------------------------------------------------ No results for input(s): CHOL, HDL, LDLCALC, TRIG, CHOLHDL, LDLDIRECT in the last 72 hours. ------------------------------------------------------------------------------------------------------------------ No results for input(s): TSH, T4TOTAL, T3FREE, THYROIDAB in the last 72 hours.  Invalid input(s):  FREET3 ------------------------------------------------------------------------------------------------------------------ No results for input(s): VITAMINB12, FOLATE, FERRITIN, TIBC, IRON, RETICCTPCT in the last 72 hours.  Coagulation profile  Recent Labs Lab 10/02/16 0158  INR 0.98    No results for input(s): DDIMER in the last 72 hours.  Cardiac Enzymes  Recent Labs Lab 09/28/16 1452 09/28/16 1811 09/28/16 2128  TROPONINI 0.04* 0.05* 0.05*   ------------------------------------------------------------------------------------------------------------------ Invalid input(s): POCBNP   CBG: No results for input(s): GLUCAP in the last 168 hours.     Studies: No results found.    No results found for: HGBA1C Lab Results  Component Value Date   LDLCALC 66 09/01/2016   CREATININE 0.93 10/02/2016       Scheduled Meds: . amLODipine  5 mg Oral Daily  . aspirin EC  325 mg Oral Daily  . clopidogrel  75 mg Oral Daily  . isosorbide mononitrate  90 mg Oral Daily  . losartan  50 mg Oral Daily  . metoprolol succinate  25  mg Oral Daily  . sodium chloride flush  3 mL Intravenous Q12H  . umeclidinium-vilanterol  1 puff Inhalation Daily   Continuous Infusions: . sodium chloride    . sodium chloride 1 mL/kg/hr (10/02/16 0110)  . heparin 1,250 Units/hr (10/01/16 1220)     LOS: 1 day    Time spent: >30 MINS    Richarda Overlie  [facility] Pager [phone]. If 7PM-7AM, please contact night-coverage at [email or web address], password [email or web address] 10/02/2016, 10:00 AM  LOS: 1 day

## 2016-10-03 ENCOUNTER — Telehealth: Payer: Self-pay | Admitting: *Deleted

## 2016-10-03 ENCOUNTER — Encounter (HOSPITAL_COMMUNITY): Payer: Self-pay | Admitting: Cardiology

## 2016-10-03 DIAGNOSIS — I25118 Atherosclerotic heart disease of native coronary artery with other forms of angina pectoris: Secondary | ICD-10-CM

## 2016-10-03 LAB — CBC
HEMATOCRIT: 38.6 % — AB (ref 39.0–52.0)
Hemoglobin: 12.3 g/dL — ABNORMAL LOW (ref 13.0–17.0)
MCH: 27.5 pg (ref 26.0–34.0)
MCHC: 31.9 g/dL (ref 30.0–36.0)
MCV: 86.2 fL (ref 78.0–100.0)
Platelets: 261 10*3/uL (ref 150–400)
RBC: 4.48 MIL/uL (ref 4.22–5.81)
RDW: 12.8 % (ref 11.5–15.5)
WBC: 6 10*3/uL (ref 4.0–10.5)

## 2016-10-03 LAB — COMPREHENSIVE METABOLIC PANEL
ALBUMIN: 2.8 g/dL — AB (ref 3.5–5.0)
ALT: 25 U/L (ref 17–63)
AST: 31 U/L (ref 15–41)
Alkaline Phosphatase: 90 U/L (ref 38–126)
Anion gap: 5 (ref 5–15)
BILIRUBIN TOTAL: 0.5 mg/dL (ref 0.3–1.2)
BUN: 11 mg/dL (ref 6–20)
CHLORIDE: 108 mmol/L (ref 101–111)
CO2: 26 mmol/L (ref 22–32)
Calcium: 8.3 mg/dL — ABNORMAL LOW (ref 8.9–10.3)
Creatinine, Ser: 1 mg/dL (ref 0.61–1.24)
GFR calc Af Amer: >60 mL/min (ref >60)
GFR calc non Af Amer: >60 mL/min (ref >60)
GLUCOSE: 112 mg/dL — AB (ref 65–99)
POTASSIUM: 4.3 mmol/L (ref 3.5–5.1)
SODIUM: 139 mmol/L (ref 135–145)
TOTAL PROTEIN: 5.7 g/dL — AB (ref 6.5–8.1)

## 2016-10-03 MED ORDER — ISOSORBIDE MONONITRATE ER 30 MG PO TB24: 90.0000 mg | ORAL_TABLET | Freq: Every day | ORAL | 2 refills | Status: DC

## 2016-10-03 MED ORDER — LEVOTHYROXINE SODIUM 125 MCG PO TABS: 125.0000 ug | ORAL_TABLET | Freq: Every day | ORAL | 2 refills | Status: DC

## 2016-10-03 MED ORDER — ASPIRIN 325 MG PO TBEC: 325.0000 mg | DELAYED_RELEASE_TABLET | Freq: Every day | ORAL | 2 refills | Status: DC

## 2016-10-03 NOTE — Discharge Summary (Signed)
Physician Discharge Summary  Matthew Evans MRN: [id number] DOB/AGE: 1950/07/04 66 y.o.  PCP: Dwan Bolt, MD   Admit date: 09/28/2016 Discharge date: 10/03/2016    Principal Problem:   Chest pain Active Problems:   Hypothyroid   COPD (chronic obstructive pulmonary disease) (HCC)   Post-op pain   Elevated troponin   Unstable angina (HCC)   Abnormal nuclear stress test    Follow-up recommendations Follow-up with PCP in 3-5 days , including all  additional recommended appointments as below Follow-up CBC, CMP in 3-5 days       Current Discharge Medication List    START taking these medications   Details  pantoprazole (PROTONIX) 40 MG tablet Take 1 tablet (40 mg total) by mouth daily. Qty: 30 tablet, Refills: 1      CONTINUE these medications which have CHANGED   Details  aspirin EC 325 MG EC tablet Take 1 tablet (325 mg total) by mouth daily. Qty: 30 tablet, Refills: 2    isosorbide mononitrate (IMDUR) 30 MG 24 hr tablet Take 3 tablets (90 mg total) by mouth daily. Qty: 30 tablet, Refills: 2      CONTINUE these medications which have NOT CHANGED   Details  amLODipine (NORVASC) 5 MG tablet TAKE 1 TABLET (5 MG TOTAL) BY MOUTH EVERY MORNING. Qty: 30 tablet, Refills: 0    clopidogrel (PLAVIX) 75 MG tablet TAKE 1 TABLET (75 MG TOTAL) BY MOUTH DAILY WITH BREAKFAST. Qty: 30 tablet, Refills: 0    losartan (COZAAR) 50 MG tablet Take 1 tablet (50 mg total) by mouth daily. Qty: 30 tablet, Refills: 11    methocarbamol (ROBAXIN) 500 MG tablet Take 500 mg by mouth 4 (four) times daily as needed for muscle spasms.    metoprolol succinate (TOPROL-XL) 25 MG 24 hr tablet Take 25 mg by mouth daily.    Omega-3 Fatty Acids (FISH OIL) 1200 MG CAPS Take 1,200 mg by mouth 2 (two) times daily.     ondansetron (ZOFRAN) 4 MG tablet Take 4 mg by mouth daily as needed for nausea/vomiting.    oxyCODONE-acetaminophen (PERCOCET/ROXICET) 5-325 MG tablet Take 1-2 tablets by mouth  every 6 (six) hours as needed for pain.    SYNTHROID 125 MCG tablet Take125 mcg by mouth every morning.    umeclidinium-vilanterol (ANORO ELLIPTA) 62.5-25 MCG/INH AEPB INHALE 1 PUFF INTO THE LUNGS DAILY. Qty: 60 each, Refills: 5   Associated Diagnoses: Chronic obstructive pulmonary disease, unspecified COPD type (HCC)    cyclobenzaprine (FLEXERIL) 10 MG tablet Take 10 mg by mouth 2 (two) times daily as needed for muscle spasms.  Refills: 0    HYDROcodone-acetaminophen (NORCO) 7.5-325 MG tablet Take 1 tablet by mouth 3 (three) times daily as needed for pain.      STOP taking these medications     hydrochlorothiazide (MICROZIDE) 12.5 MG capsule          Discharge Condition:    Discharge Instructions Get Medicines reviewed and adjusted: Please take all your medications with you for your next visit with your Primary MD  Please request your Primary MD to go over all hospital tests and procedure/radiological results at the follow up, please ask your Primary MD to get all Hospital records sent to his/her office.  If you experience worsening of your admission symptoms, develop shortness of breath, life threatening emergency, suicidal or homicidal thoughts you must seek medical attention immediately by calling 911 or calling your MD immediately if symptoms less severe.  You must read complete instructions/literature along with  all the possible adverse reactions/side effects for all the Medicines you take and that have been prescribed to you. Take any new Medicines after you have completely understood and accpet all the possible adverse reactions/side effects.   Do not drive when taking Pain medications.   Do not take more than prescribed Pain, Sleep and Anxiety Medications  Special Instructions: If you have smoked or chewed Tobacco in the last 2 yrs please stop smoking, stop any regular Alcohol and or any Recreational drug use.  Wear Seat belts while driving.  Please note  You  were cared for by a hospitalist during your hospital stay. Once you are discharged, your primary care physician will handle any further medical issues. Please note that NO REFILLS for any discharge medications will be authorized once you are discharged, as it is imperative that you return to your primary care physician (or establish a relationship with a primary care physician if you do not have one) for your aftercare needs so that they can reassess your need for medications and monitor your lab values.     Allergies  Allergen Reactions  . Niaspan [Niacin] Rash  . Septra [Sulfamethoxazole-Trimethoprim] Rash  . Simcor [Niacin-Simvastatin Er] Rash  . Zocor [Simvastatin] Rash      Disposition: 01-Home or Self Care   Consults: * Cardiology    Significant Diagnostic Studies:  Dg Chest 2 View  Result Date: 09/28/2016 CLINICAL DATA:  Shortness of breath. EXAM: CHEST  2 VIEW COMPARISON:  08/29/2016 FINDINGS: Heart and mediastinal contours are within normal limits. No focal opacities or effusions. No acute bony abnormality. IMPRESSION: No active cardiopulmonary disease. Electronically Signed   By: Rolm Baptise M.D.   On: 09/28/2016 10:16   Ct Angio Chest Pe W And/or Wo Contrast  Result Date: 09/28/2016 CLINICAL DATA:  Shortness of breath. Left-sided chest pain. Recent right shoulder surgery. EXAM: CT ANGIOGRAPHY CHEST WITH CONTRAST TECHNIQUE: Multidetector CT imaging of the chest was performed using the standard protocol during bolus administration of intravenous contrast. Multiplanar CT image reconstructions and MIPs were obtained to evaluate the vascular anatomy. CONTRAST:  100 mL Isovue 370 COMPARISON:  None. FINDINGS: Cardiovascular: Negative for pulmonary embolism. Atherosclerotic calcifications at the aortic arch. Caliber of the thoracic aorta is within normal limits. Atherosclerotic calcification involving the descending thoracic aorta. Mediastinum/Nodes: Small lymph nodes throughout the  mediastinum. Overall, no significant lymph node enlargement in the chest. Mild soft tissue edema in the right upper chest probably related to recent shoulder surgery. No significant pericardial fluid. Lungs/Pleura: Trachea and mainstem bronchi are patent. Small nodular densities along the right minor fissure best seen on the sagittal reformats, image 34, sequence 9. Largest nodule measures 6 mm in this area. Small amount of dependent atelectasis in the left lower lobe. No large areas of consolidation or airspace disease. Probable scarring along the posterolateral right chest related to old right rib fractures. Upper Abdomen: No acute abnormality in upper abdomen. Musculoskeletal: Bridging osteophytes in the lower thoracic spine. Multiple old right rib fractures along the posterior and lateral aspect. Review of the MIP images confirms the above findings. IMPRESSION: Negative for a pulmonary embolism. Few patchy and nodular densities along the right minor fissure are nonspecific. There is a nodular component measuring up to 6 mm. Non-contrast chest CT at 6-12 months is recommended. If the nodule is stable at time of repeat CT, then future CT at 18-24 months (from today's scan) is considered optional for low-risk patients, but is recommended for high-risk patients. This recommendation  follows the consensus statement: Guidelines for Management of Incidental Pulmonary Nodules Detected on CT Images: From the Fleischner Society 2017; Radiology 2017; 284:228-243. Old right rib fractures with adjacent scarring. Electronically Signed   By: Markus Daft M.D.   On: 09/28/2016 13:06   Nm Myocar Multi W/spect W/wall Motion / Ef  Result Date: 09/29/2016 CLINICAL DATA:  66 year old male with chest pain. History of myocardial infarction with coronary catheterization and angioplasty. EXAM: MYOCARDIAL IMAGING WITH SPECT (REST AND PHARMACOLOGIC-STRESS) GATED LEFT VENTRICULAR WALL MOTION STUDY LEFT VENTRICULAR EJECTION FRACTION  TECHNIQUE: Standard myocardial SPECT imaging was performed after resting intravenous injection of 10 mCi Tc-67mtetrofosmin. Subsequently, intravenous infusion of Lexiscan was performed under the supervision of the Cardiology staff. At peak effect of the drug, 30 mCi Tc-917metrofosmin was injected intravenously and standard myocardial SPECT imaging was performed. Quantitative gated imaging was also performed to evaluate left ventricular wall motion, and estimate left ventricular ejection fraction. COMPARISON:  None. FINDINGS: Perfusion: Moderate region of mild decrease counts in the mid and apical segment anterior septal wall which partially reverses. Decreased Decrease counts within mid and basilar segment of the inferior wall which are moderate and partially reversed. Wall Motion: Normal left ventricular wall motion. No left ventricular dilation. Left Ventricular Ejection Fraction: 31 % End diastolic volume 14888l End systolic volume 97 ml IMPRESSION: 1. Peri-infarct ischemia surrounding a moderate infarction in the anterior septal wall. Mild reversible ischemia in the mid and basilar basilar segment of the inferior wall. 2. Septal hypokinesia.  Mild ventricular dilatation. 3. Left ventricular ejection fraction 31% 4. Non invasive risk stratification*: High: *2012 Appropriate Use Criteria for Coronary Revascularization Focused Update: J Am Coll Cardiol. 209169;45(0):[email or web address] Electronically Signed   By: StSuzy Bouchard.D.   On: 09/29/2016 14:24    echocardiogram  LV EF: 45% -   50%  ------------------------------------------------------------------- History:   PMH:  chest pain.  Dyspnea.  Risk factors: Hypertension. Dyslipidemia.  ------------------------------------------------------------------- Study Conclusions  - Left ventricle: The cavity size was normal. Wall thickness was   increased in a pattern of mild LVH. Systolic function  was mildly   reduced. The estimated ejection fraction was in the range of 45%   to 50%. Severe hypokinesis of the midanteroseptal myocardium.   Doppler parameters are consistent with abnormal left ventricular   relaxation (grade 1 diastolic dysfunction). - Aortic valve: Valve area (Vmax): 1.93 cm^2.     Left Heart Cath and Coronary Angiography  Conclusion     LV end diastolic pressure is normal.  Mid LAD to Dist LAD lesion, 30 %stenosed. - at prior PTCA site.  Prox RCA old DES - 5 %stenosed.   Angiographically no obvious culprit lesion to explain the patient's symptoms. No lesion to explain abnormality on nuclear stress test as anything suggestive of ischemia.  Plan:  Return to nursing unit for ongoing care and TR band removal.  Would be stable for discharge tomorrow morning or if not this evening based on primary team evaluation and further treatment     Filed Weights   09/28/16 0919 09/28/16 1511  Weight: 95.3 kg (210 lb) 96.4 kg (212 lb 8 oz)     Microbiology: No results found for this or any previous visit (from the past 240 hour(s)).     Blood Culture No results found for: SDES, SPECREQUEST, CULT, REPTSTATUS    Labs: Results for orders placed or performed during the hospital encounter of 09/28/16 (from the past 48 hour(s))  Heparin level (unfractionated)  Status: None   Collection Time: 10/01/16  5:43 PM  Result Value Ref Range   Heparin Unfractionated 0.34 0.30 - 0.70 IU/mL    Comment:        IF HEPARIN RESULTS ARE BELOW EXPECTED VALUES, AND PATIENT DOSAGE HAS BEEN CONFIRMED, SUGGEST FOLLOW UP TESTING OF ANTITHROMBIN III LEVELS.   Heparin level (unfractionated)     Status: None   Collection Time: 10/02/16  1:58 AM  Result Value Ref Range   Heparin Unfractionated 0.46 0.30 - 0.70 IU/mL    Comment:        IF HEPARIN RESULTS ARE BELOW EXPECTED VALUES, AND PATIENT DOSAGE HAS BEEN CONFIRMED, SUGGEST FOLLOW UP TESTING OF ANTITHROMBIN III  LEVELS.   CBC     Status: Abnormal   Collection Time: 10/02/16  1:58 AM  Result Value Ref Range   WBC 6.6 4.0 - 10.5 K/uL   RBC 4.68 4.22 - 5.81 MIL/uL   Hemoglobin 12.8 (L) 13.0 - 17.0 g/dL   HCT 40.3 39.0 - 52.0 %   MCV 86.1 78.0 - 100.0 fL   MCH 27.4 26.0 - 34.0 pg   MCHC 31.8 30.0 - 36.0 g/dL   RDW 12.9 11.5 - 15.5 %   Platelets 267 150 - 400 K/uL  Protime-INR     Status: None   Collection Time: 10/02/16  1:58 AM  Result Value Ref Range   Prothrombin Time 13.0 11.4 - 15.2 seconds   INR 1.61   Basic metabolic panel     Status: Abnormal   Collection Time: 10/02/16  8:46 AM  Result Value Ref Range   Sodium 138 135 - 145 mmol/L   Potassium 4.4 3.5 - 5.1 mmol/L   Chloride 108 101 - 111 mmol/L   CO2 25 22 - 32 mmol/L   Glucose, Bld 103 (H) 65 - 99 mg/dL   BUN 11 6 - 20 mg/dL   Creatinine, Ser 0.93 0.61 - 1.24 mg/dL   Calcium 8.3 (L) 8.9 - 10.3 mg/dL   GFR calc non Af Amer >60 >60 mL/min   GFR calc Af Amer >60 >60 mL/min    Comment: (NOTE) The eGFR has been calculated using the CKD EPI equation. This calculation has not been validated in all clinical situations. eGFR's persistently <60 mL/min signify possible Chronic Kidney Disease.    Anion gap 5 5 - 15  TSH     Status: Abnormal   Collection Time: 10/02/16 10:14 AM  Result Value Ref Range   TSH <0.010 (L) 0.350 - 4.500 uIU/mL    Comment: Performed by a 3rd Generation assay with a functional sensitivity of <=0.01 uIU/mL.  T4, free     Status: Abnormal   Collection Time: 10/02/16 10:14 AM  Result Value Ref Range   Free T4 2.24 (H) 0.61 - 1.12 ng/dL    Comment: (NOTE) Biotin ingestion may interfere with free T4 tests. If the results are inconsistent with the TSH level, previous test results, or the clinical presentation, then consider biotin interference. If needed, order repeat testing after stopping biotin.   CBC     Status: Abnormal   Collection Time: 10/03/16  2:13 AM  Result Value Ref Range   WBC 6.0 4.0 -  10.5 K/uL   RBC 4.48 4.22 - 5.81 MIL/uL   Hemoglobin 12.3 (L) 13.0 - 17.0 g/dL   HCT 38.6 (L) 39.0 - 52.0 %   MCV 86.2 78.0 - 100.0 fL   MCH 27.5 26.0 - 34.0 pg   MCHC 31.9  30.0 - 36.0 g/dL   RDW 12.8 11.5 - 15.5 %   Platelets 261 150 - 400 K/uL  Comprehensive metabolic panel     Status: Abnormal   Collection Time: 10/03/16  2:13 AM  Result Value Ref Range   Sodium 139 135 - 145 mmol/L   Potassium 4.3 3.5 - 5.1 mmol/L   Chloride 108 101 - 111 mmol/L   CO2 26 22 - 32 mmol/L   Glucose, Bld 112 (H) 65 - 99 mg/dL   BUN 11 6 - 20 mg/dL   Creatinine, Ser 1.00 0.61 - 1.24 mg/dL   Calcium 8.3 (L) 8.9 - 10.3 mg/dL   Total Protein 5.7 (L) 6.5 - 8.1 g/dL   Albumin 2.8 (L) 3.5 - 5.0 g/dL   AST 31 15 - 41 U/L   ALT 25 17 - 63 U/L   Alkaline Phosphatase 90 38 - 126 U/L   Total Bilirubin 0.5 0.3 - 1.2 mg/dL   GFR calc non Af Amer >60 >60 mL/min   GFR calc Af Amer >60 >60 mL/min    Comment: (NOTE) The eGFR has been calculated using the CKD EPI equation. This calculation has not been validated in all clinical situations. eGFR's persistently <60 mL/min signify possible Chronic Kidney Disease.    Anion gap 5 5 - 15     Lipid Panel     Component Value Date/Time   CHOL 117 09/01/2016 0926   TRIG 94 09/01/2016 0926   HDL 32 (L) 09/01/2016 0926   CHOLHDL 3.7 09/01/2016 0926   VLDL 19 09/01/2016 0926   LDLCALC 66 09/01/2016 0926     No results found for: HGBA1C   Lab Results  Component Value Date   LDLCALC 66 09/01/2016   CREATININE 1.00 10/03/2016     HPI :  66 y.o.malewho is being seen today for the evaluation of NSTEMIat the request of Dr. Aggie Moats. Matthew Evans has a history of coronary artery disease and history of ST elevation MI in 2009 was to the RCA. As his coronary disease in the LAD and high-grade LAD stenosis and underwent cutting balloon angioplasty. He since had a number of other cardiaccatheterization showing mild to moderate coronary disease.found to have an EF of  40-45% with diffuse hypokinesis on echo,cardiology consulted ,had a stress test which was abnormal  HOSPITAL COURSE:   Chest pain/coronary artery disease  Echo 09/29/16 with LVEF 45-50%. Nuclear stress test with possible ischemia in the inferior wall and probable scar in the anteroseptal wall. Cardiac cath 10/02/16 with patent RCA stent and mild disease in the LAD.  Pt with very mild elevation of troponin (0.05, 0.05, 0.04) and abnormal nuclear stress test. Cardiac cath 10/02/16 with mild CAD. No culprit lesions to explain elevated troponin. He has had a chest CTA that was negative for PE. Will continue ASA, Plavix, Imdur, beta blocker, ARB. He has not tolerated statins in the past. OK to d/c home from cardiac perspective.  If his dyspnea is related to his COPD.   Chronic pain/Shoulder surgery pain Continue Robaxin Cont percocet  Hypothyroidism Most recent TSH less than 0.01 Repeat TSH and free T4 Synthroid 125 g a day at discharge    Hypertension Cont cozaar, toprol- XL, norvasc,   Held HCTZ  Emphysema Cont Anoro qd, when necessary nebs    Discharge Exam  Blood pressure (!) 116/51, pulse 65, temperature 97.7 F (36.5 C), temperature source Oral, resp. rate 18, height _0  (1.727 m), weight 96.4 kg (212 lb 8 oz),  SpO2 97 %.  General: Well developed, well nourished, in no acute distress. Alert and oriented x 3.  Psych:  Good affect, responds appropriately Neck: No JVD. No masses noted.  Lungs: Clear bilaterally with no wheezes or rhonci noted.  Heart: RRR with no murmurs noted. Abdomen: Bowel sounds are present. Soft, non-tender.  Extremities: No lower extremity edema.     Follow-up Information    Shelva Majestic, MD Follow up.   Specialty:  Cardiology Why:  Office will contact you with an appointment Contact information: [address] [place] [address] [phone]        Dwan Bolt, MD. Call.   Specialty:  Endocrinology Why:  Hospital  follow-up in 3-5 days Contact information: [address] [place] [address] [phone]           Signed: Reyne Dumas 10/03/2016, 11:38 AM        Time spent >45 mins

## 2016-10-03 NOTE — Progress Notes (Signed)
Matthew Evans to be D/C'd Home per MD order. Discussed with the patient and all questions fully answered.    VVS, Skin clean, dry and intact without evidence of skin break down, no evidence of skin tears noted.  IV catheter discontinued intact. Site without signs and symptoms of complications. Dressing and pressure applied.  An After Visit Summary was printed and given to the patient.  Patient escorted via WC, and D/C home via private auto.  [name]  10/03/2016 12:40 PM

## 2016-10-03 NOTE — Progress Notes (Signed)
     SUBJECTIVE: No chest pain this am. Mild dyspnea, cough.   Tele: sinus  BP (!) 116/51 (BP Location: Right Arm)   Pulse 65   Temp 97.7 F (36.5 C) (Oral)   Resp 18   Ht  (1.727 m)   Wt 212 lb 8 oz (96.4 kg)   SpO2 97%   BMI 32.31 kg/m   Intake/Output Summary (Last 24 hours) at 10/03/16 0830 Last data filed at 10/02/16 2115  Gross per 24 hour  Intake             1563 ml  Output                0 ml  Net             1563 ml    PHYSICAL EXAM General: Well developed, well nourished, in no acute distress. Alert and oriented x 3.  Psych:  Good affect, responds appropriately Neck: No JVD. No masses noted.  Lungs: Clear bilaterally with no wheezes or rhonci noted.  Heart: RRR with no murmurs noted. Abdomen: Bowel sounds are present. Soft, non-tender.  Extremities: No lower extremity edema.   LABS: Basic Metabolic Panel:  Recent Labs  91/47/82 0846 10/03/16 0213  NA 138 139  K 4.4 4.3  CL 108 108  CO2 25 26  GLUCOSE 103* 112*  BUN 11 11  CREATININE 0.93 1.00  CALCIUM 8.3* 8.3*   CBC:  Recent Labs  10/02/16 0158 10/03/16 0213  WBC 6.6 6.0  HGB 12.8* 12.3*  HCT 40.3 38.6*  MCV 86.1 86.2  PLT 267 261   Current Meds: . amLODipine  5 mg Oral Daily  . aspirin EC  325 mg Oral Daily  . clopidogrel  75 mg Oral Daily  . isosorbide mononitrate  90 mg Oral Daily  . losartan  50 mg Oral Daily  . metoprolol succinate  25 mg Oral Daily  . sodium chloride flush  3 mL Intravenous Q12H  . umeclidinium-vilanterol  1 puff Inhalation Daily    ASSESSMENT AND PLAN: 66 yo male with history of CAD with prior stenting of the RCA and angioplasty of the LAD admitted with chest pain, mild troponin elevation. Echo 09/29/16 with LVEF 45-50%. Nuclear stress test with possible ischemia in the inferior wall and probable scar in the anteroseptal wall. Cardiac cath 10/02/16 with patent RCA stent and mild disease in the LAD.   1. CAD with elevated troponin: Pt with very mild  elevation of troponin (0.05, 0.05, 0.04) and abnormal nuclear stress test. Cardiac cath 10/02/16 with mild CAD. No culprit lesions to explain elevated troponin. He has had a chest CTA that was negative for PE. Will continue ASA, Plavix, Imdur, beta blocker, ARB. He has not tolerated statins in the past. OK to d/c home from cardiac perspective. ? If his dyspnea is related to his COPD.     Matthew Evans  5/1/20188:30 AM

## 2016-10-03 NOTE — Consult Note (Signed)
[facility] CM Primary Care Navigator  10/03/2016  Matthew Evans 03/02/1951 [id number]    Went back and met with patient and wife Matthew Evans) at the bedside to identify possible discharge needs. Patient and wife reported that he had shortness of breath, mild chest pain and cold sweats that had  led to this admission.  Patient confirmed Dr. Anda Kraft with [facility] as his primary care provider.   Patient shared using [facility] to obtain medications with no difficulty.   Patient reports that wife manages his medications at home using "pill box" system weekly.   He states that wife provides transportation to his doctors' appointments.  Patient's wife is his primary caregiver at home as stated.  Anticipated discharge plan is home with outpatient home health services to start related to recent shoulder surgery.   Patient and wife voiced understanding to call primary care provider's office when returns home, for a post discharge follow-up appointment within a week or sooner if needed. Patient letter (with PCP's contact number) was provided as a reminder.  Discussed with patientand wife about [facility] CM services available for healthmanagement (COPD) but he would like to discuss with provideron his follow-up visit regarding further needs for it (since he will need to see pulmonologist - Dr. Dagmar Hait regarding his lung nodules). He is aware to request for a referral to [facility] if deemed necessary andappropriate for it.  [facility] care management contact information provided for future needs that may arise.    For questions, please contact:  Dannielle Huh, BSN, RN- [facility] Primary Care Navigator  Telephone: [phone] [place]

## 2016-10-03 NOTE — Telephone Encounter (Signed)
Left  message for patient to call and schedule 2 week  hospital follow up

## 2016-10-04 ENCOUNTER — Ambulatory Visit (INDEPENDENT_AMBULATORY_CARE_PROVIDER_SITE_OTHER): Payer: BLUE CROSS/BLUE SHIELD | Admitting: Pulmonary Disease

## 2016-10-04 ENCOUNTER — Encounter: Payer: Self-pay | Admitting: Pulmonary Disease

## 2016-10-04 DIAGNOSIS — R911 Solitary pulmonary nodule: Secondary | ICD-10-CM | POA: Diagnosis not present

## 2016-10-04 DIAGNOSIS — J449 Chronic obstructive pulmonary disease, unspecified: Secondary | ICD-10-CM | POA: Diagnosis not present

## 2016-10-04 DIAGNOSIS — I2583 Coronary atherosclerosis due to lipid rich plaque: Secondary | ICD-10-CM

## 2016-10-04 DIAGNOSIS — I2510 Atherosclerotic heart disease of native coronary artery without angina pectoris: Secondary | ICD-10-CM

## 2016-10-04 NOTE — Patient Instructions (Signed)
Stay on Anoro Albuterol MDI 2 puffs every 6 hours as needed for shortness of breath CT chest without contrast in 6 months  Call us if blood in sputum persists or is worse

## 2016-10-04 NOTE — Assessment & Plan Note (Signed)
Stay on Anoro Albuterol MDI 2 puffs every 6 hours as needed for shortness of breath  Call us if blood in sputum persists or is worse

## 2016-10-04 NOTE — Assessment & Plan Note (Addendum)
CT chest without contrast in 6 months Favor benign etiology
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
## 2016-10-05 ENCOUNTER — Telehealth: Payer: Self-pay | Admitting: Pulmonary Disease

## 2016-10-05 MED ORDER — ALBUTEROL SULFATE HFA 108 (90 BASE) MCG/ACT IN AERS: 2.0000 | INHALATION_SPRAY | Freq: Four times a day (QID) | RESPIRATORY_TRACT | 6 refills | Status: DC | PRN

## 2016-10-05 NOTE — Telephone Encounter (Signed)
Called and spoke to pt. Pt states the albuterol wasn't called into pharmacy on 5.2.2018. Rx sent to preferred pharmacy. Pt verbalized understanding and denied any further questions or concerns at this time.

## 2016-10-07 ENCOUNTER — Other Ambulatory Visit: Payer: Self-pay | Admitting: Cardiovascular Disease

## 2016-10-17 ENCOUNTER — Ambulatory Visit (INDEPENDENT_AMBULATORY_CARE_PROVIDER_SITE_OTHER): Payer: BLUE CROSS/BLUE SHIELD | Admitting: Cardiovascular Disease

## 2016-10-17 ENCOUNTER — Encounter: Payer: Self-pay | Admitting: Cardiovascular Disease

## 2016-10-17 VITALS — BP 187/70 | HR 83 | Ht 68.0 in | Wt 206.0 lb

## 2016-10-17 DIAGNOSIS — E039 Hypothyroidism, unspecified: Secondary | ICD-10-CM

## 2016-10-17 DIAGNOSIS — I447 Left bundle-branch block, unspecified: Secondary | ICD-10-CM

## 2016-10-17 DIAGNOSIS — I2510 Atherosclerotic heart disease of native coronary artery without angina pectoris: Secondary | ICD-10-CM | POA: Diagnosis not present

## 2016-10-17 DIAGNOSIS — G4733 Obstructive sleep apnea (adult) (pediatric): Secondary | ICD-10-CM

## 2016-10-17 DIAGNOSIS — I10 Essential (primary) hypertension: Secondary | ICD-10-CM | POA: Diagnosis not present

## 2016-10-17 DIAGNOSIS — I119 Hypertensive heart disease without heart failure: Secondary | ICD-10-CM | POA: Diagnosis not present

## 2016-10-17 DIAGNOSIS — I714 Abdominal aortic aneurysm, without rupture, unspecified: Secondary | ICD-10-CM

## 2016-10-17 DIAGNOSIS — I2583 Coronary atherosclerosis due to lipid rich plaque: Secondary | ICD-10-CM | POA: Diagnosis not present

## 2016-10-17 MED ORDER — ASPIRIN EC 81 MG PO TBEC: 81.0000 mg | DELAYED_RELEASE_TABLET | Freq: Every day | ORAL | 11 refills | Status: DC

## 2016-10-17 MED ORDER — LOSARTAN POTASSIUM 50 MG PO TABS: 75.0000 mg | ORAL_TABLET | Freq: Every day | ORAL | 3 refills | Status: DC

## 2016-10-17 NOTE — Progress Notes (Signed)
Patient ID: Matthew Evans, male   DOB: 1951/02/13, 66 y.o.   MRN: [id number]      HPI: Matthew Evans, is a 66 y.o. male who presents to the office today for a 7 week  cardiology evaluation and hospital follow-up evaluation.   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 He underwent staged cutting balloon to a high grade LAD stenosis which involved multiple branches and stenting was not done to reduce potential for jailing of his multiple branches. In August 2010 he was found to have 50-60% stenosis beyond his RCA stent in the LAD intervention site remained patent. He has a documented abdominal aortic aneurysm and a followup evaluation with Doppler imaging  on 08/29/2012 was essentially unchanged from one year previously and showed a measurement of 3.4x3.7 cm.  Additional problems include COPD, hypertension, hypothyroidism, hyperlipidemia, GERD. A nuclear study in February 2014 remained low risk and demonstrated mild apical hypokinesis with low normal wall motion without ischemia. An echo Doppler study in February 2014 showed mild LVH with grade 1 diastolic dysfunction and normal systolic function. He did have aortic valve thickening without stenosis and mild left atrial dilatation. Had normal pulmonary pressures.  He has a history of palpitations which has been treated with low dose  beta blocker therapy.He has COPD. He recently has been on Anora in addition to when necessary albuterol.  He underwent a Nissen fundoplication hiatal hernia surgery by Dr. Rosendo Gros in May, 2015.  Since that time, he has lost approximately 20 pounds.  In 2015 A follow-up abdominal aortic ultrasound his revealed 3.6 x 3.3 cm with a small amount of atherosclerosis visualized, which was not hemodynamically significant.  This was not significantly changed from one year previously.  He had an emergency room evaluation on 09/15/2014.  He had been working  aggressively at home in his yard intensely and overdid it.  He presented with presyncope and also had mild dyspnea.  He had noticed some mild dyspnea on exertion for the past several months.  In the emergency room he was noted to have an irregular heart rate but sinus rhythm with frequent PVCs.  PVCs were not conducted so his resting pulse radially was bradycardic.    On 09/30/2014 an echo Doppler showed an EF of 40-45% with diffuse hypokinesis and grade 2 diastolic dysfunction.  There was mitral annular calcification.  His left atrium was mildly dilated.  Right ventricle was normal.  A nuclear perfusion study done the same day was interpreted as low risk.  He has left bundle branch block.  There was a small mild fixed anterior defect consistent with soft tissue attenuation versus possible left bundle branch block abnormality.  There was no ischemia.  A follow-up abdominal ultrasound in June 2016 showed an aortic aneurysm of 3.7 x 3.5, which was not significantly changed.  At times, he has noticed rare episodes of chest pain but typically most of his symptoms are related to shortness of breath with activity.  He was supposed to be taking isosorbide 90 mg daily, but has only been taking 60 mg.  He now sees Dr.[name] for his pulmonary care.  Matthew Evans has been followed for primary care by Dr. Wilson Singer.  I had recently seen him prior to him undergoing shoulder surgery by Dr. Veverly Fells.which was done on 09/25/2016.  Prior to giving him  operative clearance I recommended that he undergo a preoperativean echo Doppler study. This was  done on 09/01/2016 and showed his echo EF had slightly improved and was now in the 45-50% range. He apparently tolerated his shoulder surgery well but after going home 2 days later developed increasing shortness of breath.  He presented to the hospital.  In the hospital, he again had another echo which showed an EF of 45-50%. He was referred for a nuclear stress test which raised concern for  possible ischemia in the inferior wall and probable scar in the anteroseptal wall.  As result, he underwent cardiac catheterization was done by Dr. Ellyn Hack, which revealed a patent RCA stent and 30% LAD and circumflex narrowing.  Angiographically there was no obvious culprit lesion to explain his symptoms.  Presently, he feels better.  He denies chest pain or shortness of breath.  He is walking up to a mile per day.  Of note, he has a history of obstructive sleep apnea but has not been using CPAP in over 3 years.  His sleep is very poor and he believes he needs to reinstitute his treatment.  He presents for evaluation.    Past Medical History:  Diagnosis Date  . AAA (abdominal aortic aneurysm) (HCC)    mild; doppler 08/29/12-3.4x3.7  . Arthritis    "hands, back" (09/28/2016)  . CAD (coronary artery disease)    echo 07/26/12-EF 55-60%; myoview 07/26/12-low normal wall motion with mild apical hypokinesis as well a septal wall motion consistent with LBBB  . Carotid artery occlusion   . Chronic lower back pain   . Cutaneous lupus erythematosus    [name], [name]  . Dementia    "moderate; dx'd in ~ 2011" (09/28/2016)  . Dermatitis   . Emphysema   . GERD (gastroesophageal reflux disease)   . H/O hiatal hernia   . Headache(784.0)   . History of palpitations   . HTN (hypertension)   . Hyperlipidemia   . Hypothyroidism    HX OF RADIOACTIVE IODINE   . Memory loss    d/t mini stroke  . Mini stroke ([place]) ~ 2011  . Myocardial infarction ([place]) 09/2007   PCI to RCA  . Shortness of breath    WITH EXERTION  . Sleep apnea    sleep study 12/14/06 ahi 16.89, during rem 12.3, supine ahi 57.49; cpap 02/22/07-c-flex of 3 at 13cm h2o; PT STATES HE DOES NOT USE HIS CPAP EVERY NIGHT  . Sleep apnea    "aien't worn mask in 3 years" (09/28/2016)  . Stroke [facility])    PT STATES NEUROLOGIST TOLD HIM HE HAD HAD MINI STROKE  . Thyroid disease     hypothyroidism    Past Surgical History:  Procedure Laterality Date  . balloon atherotomy     LAD  . BICEPS TENDON REPAIR Right 09/25/2016   "cleaned out spurs and arthritis" (09/28/2016)  . CARDIAC CATHETERIZATION  08/11/11   EF50-55%, small AAA, no signifiant stent restenosis in RCA , no restenosis of PTCA of LAD, medical therapy  . CARDIAC CATHETERIZATION  09/27/06   cutting balloon arthrotomy of LAD stenosis with dilatation of a 3.5x62m cutting balloon  . CARDIAC CATHETERIZATION  01/07/09   50-60% progressive dz beyond the stented segment in the RCA , LAD intervention remained patent  . CORONARY ANGIOPLASTY WITH STENT PLACEMENT  09/24/06   PTCA/stent of RCA with a 3.5x231mTaxus DES post dilated to 3.9624m. CORONARY ANGIOPLASTY WITH STENT PLACEMENT Right 09/2007  . ESOPHAGEAL MANOMETRY  N/A 08/04/2013   Procedure: ESOPHAGEAL MANOMETRY (EM);  Surgeon: Beryle Beams, MD;  Location: [facility] ENDOSCOPY;  Service: Endoscopy;  Laterality: N/A;  . HERNIA REPAIR    . HIATAL HERNIA REPAIR  2011?  Marland Kitchen LEFT HEART CATH AND CORONARY ANGIOGRAPHY N/A 10/02/2016   Procedure: Left Heart Cath and Coronary Angiography;  Surgeon: Leonie Man, MD;  Location: [place] CV LAB;  Service: Cardiovascular;  Laterality: N/A;  . LEFT HEART CATHETERIZATION WITH CORONARY ANGIOGRAM N/A 08/11/2011   Procedure: LEFT HEART CATHETERIZATION WITH CORONARY ANGIOGRAM;  Surgeon: Troy Sine, MD;  Location: [facility] CATH LAB;  Service: Cardiovascular;  Laterality: N/A;    Allergies  Allergen Reactions  . Niaspan [Niacin] Rash  . Septra [Sulfamethoxazole-Trimethoprim] Rash  . Simcor [Niacin-Simvastatin Er] Rash  . Zocor [Simvastatin] Rash    Current Outpatient Prescriptions  Medication Sig Dispense Refill  . albuterol (PROVENTIL HFA;VENTOLIN HFA) 108 (90 Base) MCG/ACT inhaler Inhale 2 puffs into the lungs every 6 (six) hours as needed for wheezing or shortness of breath. 1 Inhaler 6  . amLODipine (NORVASC) 5 MG tablet TAKE 1  TABLET (5 MG TOTAL) BY MOUTH EVERY MORNING. 30 tablet 8  . clopidogrel (PLAVIX) 75 MG tablet TAKE 1 TABLET (75 MG TOTAL) BY MOUTH DAILY WITH BREAKFAST. 30 tablet 8  . cyclobenzaprine (FLEXERIL) 10 MG tablet Take 10 mg by mouth 2 (two) times daily as needed for muscle spasms.   0  . HYDROcodone-acetaminophen (NORCO) 7.5-325 MG tablet Take 1 tablet by mouth 3 (three) times daily as needed for pain.    . isosorbide mononitrate (IMDUR) 30 MG 24 hr tablet Take 3 tablets (90 mg total) by mouth daily. 30 tablet 2  . methocarbamol (ROBAXIN) 500 MG tablet Take 500 mg by mouth 4 (four) times daily as needed for muscle spasms.    . metoprolol succinate (TOPROL-XL) 25 MG 24 hr tablet Take 25 mg by mouth daily.    . Omega-3 Fatty Acids (FISH OIL) 1200 MG CAPS Take 1,200 mg by mouth 2 (two) times daily.     . ondansetron (ZOFRAN) 4 MG tablet Take 4 mg by mouth daily as needed for nausea/vomiting.    Marland Kitchen oxyCODONE-acetaminophen (PERCOCET/ROXICET) 5-325 MG tablet Take 1-2 tablets by mouth every 6 (six) hours as needed for pain.    . pantoprazole (PROTONIX) 40 MG tablet Take 1 tablet (40 mg total) by mouth daily. 30 tablet 1  . umeclidinium-vilanterol (ANORO ELLIPTA) 62.5-25 MCG/INH AEPB INHALE 1 PUFF INTO THE LUNGS DAILY. 60 each 5  . aspirin EC 81 MG tablet Take 1 tablet (81 mg total) by mouth daily. 30 tablet 11  . levothyroxine (SYNTHROID) 100 MCG tablet Take 1 tablet (100 mcg total) by mouth daily before breakfast. 30 tablet 3  . losartan (COZAAR) 50 MG tablet Take 1.5 tablets (75 mg total) by mouth daily. 135 tablet 3   No current facility-administered medications for this visit.     Socially he is married has 3 children. There is a remote tobacco history. He has tried to limit his sodium intake.  His wife also is my patient was diagnosed with recent breast cancer.  ROS General: Negative; No fevers, chills, or night sweats;  HEENT: Negative; No changes in vision or hearing, sinus congestion, difficulty  swallowing Pulmonary: Positive for COPD, with occasional wheezing;  mild shortness of breath with uphill walking Cardiovascular: See history of present illness GI: Hiatal hernia symptoms has resolved  since his Nissen fundoplication; No nausea, vomiting, diarrhea, or abdominal  pain GU: Negative; No dysuria, hematuria, or difficulty voiding Musculoskeletal: Negative; no myalgias, joint pain, or weakness Hematologic/Oncology: Negative; no easy bruising, bleeding Endocrine: Negative; no heat/cold intolerance; no diabetes Neuro: Negative; no changes in balance, headaches; occasional numbness in his hands. Skin: Negative; No rashes or skin lesions Psychiatric: Negative; No behavioral problems, depression Sleep: Negative; No snoring, daytime sleepiness, hypersomnolence, bruxism, restless legs, hypnogognic hallucinations, no cataplexy Other comprehensive 14 point system review is negative.   PE BP (!) 187/70   Pulse 83   Ht '5\' 8"'  (1.727 m)   Wt 206 lb (93.4 kg)   BMI 31.32 kg/m    Repeat blood pressure 128/70  Wt Readings from Last 3 Encounters:  10/17/16 206 lb (93.4 kg)  10/04/16 207 lb 12.8 oz (94.3 kg)  09/28/16 212 lb 8 oz (96.4 kg)   General: Alert, oriented, no distress.  Skin: normal turgor, no rashes, warm and dry HEENT: Normocephalic, atraumatic. Pupils equal round and reactive to light; sclera anicteric; extraocular muscles intact; Fundi without hemorrhages or exudates Nose without nasal septal hypertrophy Mouth/Parynx benign; Mallinpatti scale 3 Neck: No JVD, no carotid bruits; normal carotid upstroke Lungs: clear to ausculatation and percussion; no wheezing or rales Chest wall: without tenderness to palpitation Heart: PMI not displaced, RRR, s1 s2 normal, 1/6 systolic murmur, no diastolic murmur, no rubs, gallops, thrills, or heaves Abdomen: soft, nontender; no hepatosplenomehaly, BS+; abdominal aorta nontender and not dilated by palpation. Back: no CVA  tenderness Pulses 2+ Musculoskeletal: full range of motion, normal strength, no joint deformities Extremities: no clubbing cyanosis or edema, Homan's sign negative  Neurologic: grossly nonfocal; Cranial nerves grossly wnl Psychologic: Normal mood and affect   ECG (independently read by me): Sinus bradycardia at 47 bpm.  Left bundle branch block with repolarization changes.  Nose other significant ST changes.  March 2018 ECG (independently read by me):Normal sinus rhythm at 68 bpm.  Left bundle branch block with repolarization changes.  Her interval 166 ms; QTc interval 467 ms.  December 2016 ECG (independently read by me): Sinus bradycardia 58 bpm.  Left bundle branch block.  PR interval 148 ms.    June 2016 ECG (independently read by me): Sinus bradycardia 52 bpm.  Left bundle branch block with repolarization changes.  April 2016 ECG (independently read by me): Sinus rhythm at 62 bpm.  PVC.  QTc interval 475 ms.  September 2015 ECG (independently read by me): Sinus bradycardia at 46 beats per minute.  Left bundle branch block with repolarization changes  05/13/2013 ECG: Sinus bradycardia 50 beats per minute. beats per minute with mild left axis deviation and left bundle branch block. There is complete resolution of prior ectopy.   LABS  BMP Latest Ref Rng & Units 10/03/2016 10/02/2016 09/30/2016  Glucose 65 - 99 mg/dL 112(H) 103(H) 98  BUN 6 - 20 mg/dL '11 11 15  ' Creatinine 0.61 - 1.24 mg/dL 1.00 0.93 0.98  Sodium 135 - 145 mmol/L 139 138 138  Potassium 3.5 - 5.1 mmol/L 4.3 4.4 3.6  Chloride 101 - 111 mmol/L 108 108 103  CO2 22 - 32 mmol/L '26 25 26  ' Calcium 8.9 - 10.3 mg/dL 8.3(L) 8.3(L) 8.5(L)     Hepatic Function Latest Ref Rng & Units 10/03/2016 09/30/2016 09/01/2016  Total Protein 6.5 - 8.1 g/dL 5.7(L) 5.9(L) 6.2  Albumin 3.5 - 5.0 g/dL 2.8(L) 3.0(L) 3.7  AST 15 - 41 U/L '31 24 14  ' ALT 17 - 63 U/L 25 15(L) 12  Alk Phosphatase 38 - 126  U/L 90 91 102  Total Bilirubin 0.3 - 1.2  mg/dL 0.5 0.8 0.9    CBC Latest Ref Rng & Units 10/03/2016 10/02/2016 09/30/2016  WBC 4.0 - 10.5 K/uL 6.0 6.6 7.9  Hemoglobin 13.0 - 17.0 g/dL 12.3(L) 12.8(L) 13.3  Hematocrit 39.0 - 52.0 % 38.6(L) 40.3 41.2  Platelets 150 - 400 K/uL 261 267 292   Lab Results  Component Value Date   TSH <0.010 (L) 10/02/2016    BNP    Component Value Date/Time   PROBNP 20.0 11/03/2013 1737    Lipid Panel     Component Value Date/Time   CHOL 117 09/01/2016 0926   TRIG 94 09/01/2016 0926   HDL 32 (L) 09/01/2016 0926   CHOLHDL 3.7 09/01/2016 0926   VLDL 19 09/01/2016 0926   LDLCALC 66 09/01/2016 0926     RADIOLOGY: No results found.  IMPRESSION:  1. Coronary artery disease due to lipid rich plaque   2. Abdominal aortic aneurysm (AAA) without rupture ([place])   3. OSA (obstructive sleep apnea)   4. Essential hypertension   5. Hypothyroidism, unspecified type   6. Hypertensive heart disease without heart failure   7. Left bundle branch block     ASSESSMENT AND PLAN: Matthew Evans is a 66 year old white male who is 9 years status post suffering an  inferior ST segment elevation myocardial infarction for which he underwent successful stenting of his RCA and staged cutting balloon to high grade LAD stenosis.  A  nuclear perfusion study in 2016 was low risk and showed probable attenuation artifact versus possible left bundle branch block attenuation.  Importantly, there was no evidence for ischemia.  Ejection fraction was 45%, which corroborates with his EF estimate on his echo Doppler study. When I last saw him, his preoperative echo Doppler study showed an EF of 45-50% and he had grade 1 diastolic dysfunction.  He tolerated his shoulder surgery well but several days later developed increasing shortness of breath leading to hospitalization.  I suspect if this potentially was related to mild volume overload.  Troponins were 0.05 with a flat plateau.  He ultimately had a nuclear stress study in due to  concerns for possible ischemia.  Definitive catheterization was recommended.  I did not see him during his hospitalization.  I reviewed the catheterization findings which revealed stable coronary disease with a widely patent RCA stent and mild nonobstructive disease as noted above.  Upon presentation to the office today his blood pressure was elevated initially at 187/70 and on repeat by me was 128/70, suggesting that he still has labile hypertension.  His echo Doppler study has shown mild diastolic dysfunction.  Review of his medication lists indicate that he has been taking 325 mg of aspirin.  This again will be reduced to 81 mg.currently, he has been on amlodipine 5 mg, isosorbide 90 mg daily, losartan 50 mg in addition to Toprol-XL 25 mg.  I am further titrating losartan to 75 mg for optimal blood pressure control. Upon further discussion, he is had difficulty with his recent sleep.  He had been on CPAP in the past, but he told me today that he has not used this over 3 years.  I will schedule him for a new sleep study in light of his snoring, nonrestorative sleep, and documented significant nocturnal hypoxia which was noted during his hospitalization. I reviewed his recent laboratory from when he was in the hospital.  Apparently his TSH is over suppressed.  He has been on  levothyroxine 125 g.  I will reduce this to 100 g.and will need to recheck his TSH in 4 weeks.  I will see him in 3 months for follow up evaluation.  Troy Sine, MD, [facility]  10/19/2016 6:24 PM

## 2016-10-17 NOTE — Patient Instructions (Signed)
[facility] Your physician has recommended that you have a sleep study. This test records several body functions during sleep, including: brain activity, eye movement, oxygen and carbon dioxide blood levels, heart rate and rhythm, breathing rate and rhythm, the flow of air through your mouth and nose, snoring, body muscle movements, and chest and belly movement.   MEDICATION CHANGES  DECREASE ASPIRIN TO 81 MG  ENTERIC COATED    INCREASE LOSARTAN 75 MG   ( 1 AND 1/2 TABLETS) DAILY    Your physician wants you to follow-up in 4 MONTHS WITH DR Tresa EndoKELLY. You will receive a reminder letter in the mail two months in advance. If you don't receive a letter, please call our office to schedule the follow-up appointment.    If you need a refill on your cardiac medications before your next appointment, please call your pharmacy.

## 2016-10-18 ENCOUNTER — Telehealth: Payer: Self-pay | Admitting: Cardiovascular Disease

## 2016-10-18 NOTE — Telephone Encounter (Signed)
Patient brought FMLA Forms to office for spouses job on 10/17/16.  Received signed [employer] and [employer] forms.  Sent to [employer] on 10/18/16 to process.  Sent via courier. lp

## 2016-10-19 ENCOUNTER — Other Ambulatory Visit: Payer: Self-pay | Admitting: *Deleted

## 2016-10-19 DIAGNOSIS — E032 Hypothyroidism due to medicaments and other exogenous substances: Secondary | ICD-10-CM

## 2016-10-19 MED ORDER — LEVOTHYROXINE SODIUM 100 MCG PO TABS: 100.0000 ug | ORAL_TABLET | Freq: Every day | ORAL | 3 refills | Status: DC

## 2016-10-19 NOTE — Telephone Encounter (Signed)
Left message that Dr Tresa Endokelly decreased his levothyroxine from 125 mcg to 100 mcg daily. A new prescription has been sent to his pharmacy. We will send a lab order to have his thyroid rechecked again in 1 month. Call me back if he has any questions or concerns.

## 2016-10-23 ENCOUNTER — Telehealth: Payer: Self-pay | Admitting: Cardiovascular Disease

## 2016-10-23 NOTE — Telephone Encounter (Signed)
Received FMLA Forms back from [employer] @ [employer]. Received on 5.17.18.  Forms given to Claiborne RiggWanda Waddell for Dr Tresa EndoKelly to review, complete and sign. lp

## 2016-11-01 ENCOUNTER — Telehealth: Payer: Self-pay | Admitting: Cardiovascular Disease

## 2016-11-01 NOTE — Telephone Encounter (Signed)
Received signed FMLA Forms back from Dr Tresa EndoKelly. Advised patient forms completed.  Faxed to  [employer] on 10/31/16. lp

## 2016-11-05 ENCOUNTER — Other Ambulatory Visit: Payer: Self-pay | Admitting: Cardiovascular Disease

## 2016-11-06 NOTE — Telephone Encounter (Signed)
REFILL 

## 2016-11-29 ENCOUNTER — Telehealth: Payer: Self-pay | Admitting: Cardiovascular Disease

## 2016-11-29 NOTE — Telephone Encounter (Signed)
Error-disregard previous note.  °

## 2016-11-29 NOTE — Telephone Encounter (Signed)
Matthew MortimerWanda, this pt did not get scheduled for sleep study. Per TK's last OV note:I will schedule him for a new sleep study in light of his snoring, nonrestorative sleep, and documented significant nocturnal hypoxia which was noted during his hospitalization.

## 2016-11-29 NOTE — Telephone Encounter (Signed)
S/w doug he states that there is no new medication just waiting for the PT order 

## 2016-11-29 NOTE — Telephone Encounter (Signed)
New Message     Who does pt call to get sleep study scheduled with

## 2016-12-01 ENCOUNTER — Ambulatory Visit: Payer: BLUE CROSS/BLUE SHIELD | Admitting: Cardiovascular Disease

## 2016-12-04 NOTE — Telephone Encounter (Signed)
New message     Pt never got scheduled for sleep study and she wants to know what is going on

## 2016-12-05 NOTE — Telephone Encounter (Signed)
Called patient's wife  notified of appointment to have sleep study done August 2nd.

## 2017-01-04 ENCOUNTER — Ambulatory Visit (HOSPITAL_BASED_OUTPATIENT_CLINIC_OR_DEPARTMENT_OTHER): Payer: BLUE CROSS/BLUE SHIELD | Attending: Cardiovascular Disease | Admitting: Cardiovascular Disease

## 2017-01-04 VITALS — Ht 68.0 in | Wt 207.0 lb

## 2017-01-04 DIAGNOSIS — G4733 Obstructive sleep apnea (adult) (pediatric): Secondary | ICD-10-CM

## 2017-01-04 DIAGNOSIS — Z7982 Long term (current) use of aspirin: Secondary | ICD-10-CM | POA: Insufficient documentation

## 2017-01-04 DIAGNOSIS — Z7902 Long term (current) use of antithrombotics/antiplatelets: Secondary | ICD-10-CM | POA: Insufficient documentation

## 2017-01-04 DIAGNOSIS — G4710 Hypersomnia, unspecified: Secondary | ICD-10-CM | POA: Insufficient documentation

## 2017-01-04 DIAGNOSIS — Z79899 Other long term (current) drug therapy: Secondary | ICD-10-CM | POA: Diagnosis not present

## 2017-01-04 DIAGNOSIS — G478 Other sleep disorders: Secondary | ICD-10-CM | POA: Insufficient documentation

## 2017-01-04 DIAGNOSIS — G4739 Other sleep apnea: Secondary | ICD-10-CM

## 2017-01-04 DIAGNOSIS — G4730 Sleep apnea, unspecified: Secondary | ICD-10-CM

## 2017-01-04 DIAGNOSIS — G4719 Other hypersomnia: Secondary | ICD-10-CM

## 2017-01-04 DIAGNOSIS — R0683 Snoring: Secondary | ICD-10-CM

## 2017-02-03 NOTE — Procedures (Signed)
Patient Name: Matthew Evans, Matthew Evans Date: 01/04/2017 Gender: Male D.O.B: July 03, 1950 Age (years): 63 Referring Provider: Nicki Guadalajara MD, ABSM Height (inches): 68 Interpreting Physician: Nicki Guadalajara MD, ABSM Weight (lbs): 207 RPSGT: Cherylann Parr BMI: 31 MRN: [id number] Neck Size: 17.00  CLINICAL INFORMATION Sleep Study Type: NPSG  Indication for sleep study: Fatigue, OSA, Snoring, Witnessed Apneas.  history of previous sleep apnea.  He has not used CPAP in over 3 years.  Epworth Sleepiness Score: 17  SLEEP STUDY TECHNIQUE As per the AASM Manual for the Scoring of Sleep and Associated Events v2.3 (April 2016) with a hypopnea requiring 4% desaturations.  The channels recorded and monitored were frontal, central and occipital EEG, electrooculogram (EOG), submentalis EMG (chin), nasal and oral airflow, thoracic and abdominal wall motion, anterior tibialis EMG, snore microphone, electrocardiogram, and pulse oximetry.  MEDICATIONS     albuterol (PROVENTIL HFA;VENTOLIN HFA) 108 (90 Base) MCG/ACT inhaler    Inhale 2 puffs into the lungs every 6 (six) hours as needed for wheezing or shortness of breath.     *    amLODipine (NORVASC) 5 MG tablet    TAKE 1 TABLET (5 MG TOTAL) BY MOUTH EVERY MORNING.     *    clopidogrel (PLAVIX) 75 MG tablet    TAKE 1 TABLET (75 MG TOTAL) BY MOUTH DAILY WITH BREAKFAST.     *    cyclobenzaprine (FLEXERIL) 10 MG tablet    Take 10 mg by mouth 2 (two) times daily as needed for muscle spasms.         *    HYDROcodone-acetaminophen (NORCO) 7.5-325 MG tablet    Take 1 tablet by mouth 3 (three) times daily as needed for pain.           *    isosorbide mononitrate (IMDUR) 30 MG 24 hr tablet    Take 3 tablets (90 mg total) by mouth daily.     *    methocarbamol (ROBAXIN) 500 MG tablet    Take 500 mg by mouth 4 (four) times daily as needed for muscle spasms.           *    metoprolol succinate (TOPROL-XL) 25 MG 24 hr tablet    Take 25 mg by mouth daily.            *    Omega-3 Fatty Acids (FISH OIL) 1200 MG CAPS    Take 1,200 mg by mouth 2 (two) times daily.            *    ondansetron (ZOFRAN) 4 MG tablet    Take 4 mg by mouth daily as needed for nausea/vomiting.           *    oxyCODONE-acetaminophen (PERCOCET/ROXICET) 5-325 MG tablet    Take 1-2 tablets by mouth every 6 (six) hours as needed for pain.           *    pantoprazole (PROTONIX) 40 MG tablet    Take 1 tablet (40 mg total) by mouth daily.     *    umeclidinium-vilanterol (ANORO ELLIPTA) 62.5-25 MCG/INH AEPB    INHALE 1 PUFF INTO THE LUNGS DAILY.     *    aspirin EC 81 MG tablet    Take 1 tablet (81 mg total) by mouth daily.     *    levothyroxine (SYNTHROID) 100 MCG tablet    Take 1 tablet (100 mcg total) by mouth daily  before breakfast.     *    losartan (COZAAR) 50 MG tablet    Take 1.5 tablets (75 mg total) by mouth daily.          Medications self-administered by patient taken the night of the study : N/A  SLEEP ARCHITECTURE The study was initiated at 10:16:49 PM and ended at 4:56:32 AM.  Sleep onset time was 50.9 minutes and the sleep efficiency was 65.5%. The total sleep time was 261.9 minutes. Wake after sleep onset (WASO) was 87 minutes.  Stage REM latency was 193.0 minutes.  The patient spent 4.96% of the night in stage N1 sleep, 76.90% in stage N2 sleep, 0.00% in stage N3 and 18.14% in REM.  Alpha intrusion was absent.  Supine sleep was 0.00%.  RESPIRATORY PARAMETERS The overall apnea/hypopnea index (AHI) was 1.1 per hour. There were 3 total apneas, including 0 obstructive, 3 central and 0 mixed apneas. There were 2 hypopneas and 1 RERAs.  The AHI during Stage REM sleep was 1.3 per hour.  AHI while supine was N/A per hour.  The mean oxygen saturation was 90.79%. The minimum SpO2 during sleep was 88.00%.  Soft snoring was noted during this study.  CARDIAC DATA The 2 lead EKG demonstrated sinus rhythm. The mean heart rate was 41.32 beats per minute. Other EKG  findings include: None.  LEG MOVEMENT DATA The total PLMS were 0 with a resulting PLMS index of 0.00. Associated arousal with leg movement index was 0.0 .  IMPRESSIONS - No significant obstructive sleep apnea occurred during this study (AHI = 1.1/h). - No significant central sleep apnea occurred during this study (CAI = 0.7/h). - The patient had minimal oxygen desaturation during the study (Min O2 = 88.00%) - The patient snored with Soft snoring volume. - Abnormal sleep architecture with absence of slow-wave sleep and prolonged latency to REM sleep - No cardiac abnormalities were noted during this study. - Clinically significant periodic limb movements did not occur during sleep. No significant associated arousals.  DIAGNOSIS - Excessive daytime sleepiness - Mild increased upper airway resistance  RECOMMENDATIONS - The present study shows mild increased upper airway resistance without definitive obstructive sleep apnea. At present, there is no indication for CPAP therapy. - If the patient continues to have significant difficulty with excessive daytime sleepiness, consider a possible evaluation with multiple latency sleep test (MLST) to assess for idiopathic hypersomnolence. Strong efforts should be made to improve sleep hygiene and sleep duration. - Avoid alcohol, sedatives and other CNS depressants that may worsen sleep apnea and disrupt normal sleep architecture. - Sleep hygiene should be reviewed to assess factors that may improve sleep quality. - Weight management and regular exercise should be initiated or continued if appropriate.  [Electronically signed] 02/03/2017 04:19 PM  Nicki Guadalajarahomas Gadge Hermiz MD, [facility] RichardsonFACC, ABSM Diplomate, American Board of Sleep Medicine  NPI: [id number] NAME:   [facility] PH: [phone]   FX: [phone] ACCREDITED BY THE AMERICAN ACADEMY OF SLEEP MEDICINE

## 2017-02-17 ENCOUNTER — Other Ambulatory Visit: Payer: Self-pay | Admitting: Cardiovascular Disease

## 2017-02-19 NOTE — Telephone Encounter (Signed)
Rx has been sent to the pharmacy electronically. ° °

## 2017-03-25 ENCOUNTER — Other Ambulatory Visit: Payer: Self-pay | Admitting: Adult Health

## 2017-03-25 DIAGNOSIS — J449 Chronic obstructive pulmonary disease, unspecified: Secondary | ICD-10-CM

## 2017-03-26 ENCOUNTER — Other Ambulatory Visit: Payer: Self-pay | Admitting: *Deleted

## 2017-03-26 DIAGNOSIS — I714 Abdominal aortic aneurysm, without rupture, unspecified: Secondary | ICD-10-CM

## 2017-03-29 ENCOUNTER — Telehealth: Payer: Self-pay | Admitting: Cardiovascular Disease

## 2017-03-29 NOTE — Telephone Encounter (Signed)
Matthew Evans is calling to get results from Matthew Evans Sleep Study . He had the test a while back and has not heard anything . Please Call

## 2017-03-29 NOTE — Telephone Encounter (Signed)
Spoke to wife (ok per DPR), apologized that sleep study results had not been reported at this time.   Sleep study was completed 8/2 and read by Dr. Tresa EndoKelly.   No indication for CPAP at this time.   Patient due for f/u with Dr. Tresa EndoKelly, will have scheduler call for appt.  Wife aware and verbalized understanding.

## 2017-04-04 ENCOUNTER — Ambulatory Visit (INDEPENDENT_AMBULATORY_CARE_PROVIDER_SITE_OTHER): Payer: BLUE CROSS/BLUE SHIELD | Admitting: Cardiovascular Disease

## 2017-04-04 ENCOUNTER — Encounter: Payer: Self-pay | Admitting: Cardiovascular Disease

## 2017-04-04 VITALS — BP 122/62 | HR 56 | Resp 16 | Ht 68.0 in | Wt 209.8 lb

## 2017-04-04 DIAGNOSIS — I714 Abdominal aortic aneurysm, without rupture, unspecified: Secondary | ICD-10-CM

## 2017-04-04 DIAGNOSIS — I2510 Atherosclerotic heart disease of native coronary artery without angina pectoris: Secondary | ICD-10-CM

## 2017-04-04 DIAGNOSIS — I10 Essential (primary) hypertension: Secondary | ICD-10-CM | POA: Diagnosis not present

## 2017-04-04 DIAGNOSIS — G478 Other sleep disorders: Secondary | ICD-10-CM | POA: Diagnosis not present

## 2017-04-04 DIAGNOSIS — I2583 Coronary atherosclerosis due to lipid rich plaque: Secondary | ICD-10-CM

## 2017-04-04 DIAGNOSIS — E039 Hypothyroidism, unspecified: Secondary | ICD-10-CM

## 2017-04-04 NOTE — Progress Notes (Signed)
Patient ID: Matthew Evans, male   DOB: 06-Feb-1951, 66 y.o.   MRN: [id number]      HPI: Matthew Evans, is a 66 y.o. male who presents to the office today for a 5 month cardiology evaluation and hospital follow-up evaluation.   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 He underwent staged cutting balloon to a high grade LAD stenosis which involved multiple branches and stenting was not done to reduce potential for jailing of his multiple branches. In August 2010 he was found to have 50-60% stenosis beyond his RCA stent in the LAD intervention site remained patent. He has a documented abdominal aortic aneurysm and a followup evaluation with Doppler imaging  on 08/29/2012 was essentially unchanged from one year previously and showed a measurement of 3.4x3.7 cm.  Additional problems include COPD, hypertension, hypothyroidism, hyperlipidemia, GERD. A nuclear study in February 2014 remained low risk and demonstrated mild apical hypokinesis with low normal wall motion without ischemia. An echo Doppler study in February 2014 showed mild LVH with grade 1 diastolic dysfunction and normal systolic function. He did have aortic valve thickening without stenosis and mild left atrial dilatation. Had normal pulmonary pressures.  He has a history of palpitations which has been treated with low dose  beta blocker therapy.He has COPD. He recently has been on Anora in addition to when necessary albuterol.  He underwent a Nissen fundoplication hiatal hernia surgery by Dr. Rosendo Gros in May, 2015.  Since that time, he has lost approximately 20 pounds.  In 2015 A follow-up abdominal aortic ultrasound his revealed 3.6 x 3.3 cm with a small amount of atherosclerosis visualized, which was not hemodynamically significant.  This was not significantly changed from one year previously.  He had an emergency room evaluation on 09/15/2014.  He had been working  aggressively at home in his yard intensely and overdid it.  He presented with presyncope and also had mild dyspnea.  He had noticed some mild dyspnea on exertion for the past several months.  In the emergency room he was noted to have an irregular heart rate but sinus rhythm with frequent PVCs.  PVCs were not conducted so his resting pulse radially was bradycardic.    On 09/30/2014 an echo Doppler showed an EF of 40-45% with diffuse hypokinesis and grade 2 diastolic dysfunction.  There was mitral annular calcification.  His left atrium was mildly dilated.  Right ventricle was normal.  A nuclear perfusion study done the same day was interpreted as low risk.  He has left bundle branch block.  There was a small mild fixed anterior defect consistent with soft tissue attenuation versus possible left bundle branch block abnormality.  There was no ischemia.  A follow-up abdominal ultrasound in June 2016 showed an aortic aneurysm of 3.7 x 3.5, which was not significantly changed.  At times, he has noticed rare episodes of chest pain but typically most of his symptoms are related to shortness of breath with activity.  He was supposed to be taking isosorbide 90 mg daily, but has only been taking 60 mg.  He now sees Matthew Evans for his pulmonary care.  Matthew Evans has been followed for primary care by Matthew Evans.  I had recently seen him prior to him undergoing shoulder surgery by Matthew Evans.which was done on 09/25/2016.  Prior to giving him  operative clearance I recommended that he undergo a preoperativean echo Doppler study. This was done  on 09/01/2016 and showed his echo EF had slightly improved and was now in the 45-50% range. He apparently tolerated his shoulder surgery well but after going home 2 days later developed increasing shortness of breath.  He presented to the hospital.  In the hospital, he again had another echo which showed an EF of 45-50%. He was referred for a nuclear stress test which raised concern for  possible ischemia in the inferior wall and probable scar in the anteroseptal wall.  As result, he underwent cardiac catheterization was done by Matthew Evans, which revealed a patent RCA stent and 30% LAD and circumflex narrowing.  Angiographically there was no obvious culprit lesion to explain his symptoms.  When I last saw him he denied any chest pain or shortness of breath.  He was walking daily up to a mile.  He has remote history of obstructive sleep apnea but had not use CPAP in over 3 years.  He stated that his sleep was poor and was wondering if he needs to reinstitute to to treatment.    He underwent a sleep study in 01/04/2017.  No significant sleep apnea was demonstrated with an overall AHI of 1.1.  AHI during rim sleep was 1.3.  There was minimum oxygen desaturation to 88%.  He had abnormal sleep architecture with absence of slow-wave sleep and prolonged latency to rems sleep.  It was felt that he had mild increased upper airway resistance syndrome without definitive obstructive sleep apnea.  Over the past several months.  He is sleeping better.  He is now seeing Matthew Evans for primary care.  He presents for evaluation.  Past Medical History:  Diagnosis Date  . AAA (abdominal aortic aneurysm) (HCC)    mild; doppler 08/29/12-3.4x3.7  . Arthritis    "hands, back" (09/28/2016)  . CAD (coronary artery disease)    echo 07/26/12-EF 55-60%; myoview 07/26/12-low normal wall motion with mild apical hypokinesis as well a septal wall motion consistent with LBBB  . Carotid artery occlusion   . Chronic lower back pain   . Cutaneous lupus erythematosus    [name], [name]  . Dementia    "moderate; dx'd in ~ 2011" (09/28/2016)  . Dermatitis   . Emphysema   . GERD (gastroesophageal reflux disease)   . H/O hiatal hernia   . Headache(784.0)   . History of palpitations   . HTN (hypertension)   . Hyperlipidemia   . Hypothyroidism    HX  OF RADIOACTIVE IODINE   . Memory loss    d/t mini stroke  . Mini stroke ([place]) ~ 2011  . Myocardial infarction ([place]) 09/2007   PCI to RCA  . Shortness of breath    WITH EXERTION  . Sleep apnea    sleep study 12/14/06 ahi 16.89, during rem 12.3, supine ahi 57.49; cpap 02/22/07-c-flex of 3 at 13cm h2o; PT STATES HE DOES NOT USE HIS CPAP EVERY NIGHT  . Sleep apnea    "aien't worn mask in 3 years" (09/28/2016)  . Stroke [facility])    PT STATES NEUROLOGIST TOLD HIM HE HAD HAD MINI STROKE  . Thyroid disease    hypothyroidism    Past Surgical History:  Procedure Laterality Date  . balloon atherotomy     LAD  . BICEPS TENDON REPAIR Right 09/25/2016   "cleaned out spurs and arthritis" (09/28/2016)  . CARDIAC CATHETERIZATION  08/11/11   EF50-55%, small AAA, no signifiant stent  restenosis in RCA , no restenosis of PTCA of LAD, medical therapy  . CARDIAC CATHETERIZATION  09/27/06   cutting balloon arthrotomy of LAD stenosis with dilatation of a 3.5x52m cutting balloon  . CARDIAC CATHETERIZATION  01/07/09   50-60% progressive dz beyond the stented segment in the RCA , LAD intervention remained patent  . CORONARY ANGIOPLASTY WITH STENT PLACEMENT  09/24/06   PTCA/stent of RCA with a 3.5x214mTaxus DES post dilated to 3.9669m. CORONARY ANGIOPLASTY WITH STENT PLACEMENT Right 09/2007  . ESOPHAGEAL MANOMETRY N/A 08/04/2013   Procedure: ESOPHAGEAL MANOMETRY (EM);  Surgeon: PatBeryle BeamsD;  Location: [facility];  Service: Endoscopy;  Laterality: N/A;  . HERNIA REPAIR    . HIATAL HERNIA REPAIR  2011?  . LMarland KitchenFT HEART CATH AND CORONARY ANGIOGRAPHY N/A 10/02/2016   Procedure: Left Heart Cath and Coronary Angiography;  Surgeon: DavLeonie ManD;  Location: [facility];  Service: Cardiovascular;  Laterality: N/A;  . LEFT HEART CATHETERIZATION WITH CORONARY ANGIOGRAM N/A 08/11/2011   Procedure: LEFT HEART CATHETERIZATION WITH CORONARY ANGIOGRAM;  Surgeon: ThoTroy SineD;  [facility];  Service:  Cardiovascular;  Laterality: N/A;    Allergies  Allergen Reactions  . Niaspan [Niacin] Rash  . Septra [Sulfamethoxazole-Trimethoprim] Rash  . Simcor [Niacin-Simvastatin Er] Rash  . Zocor [Simvastatin] Rash    Current Outpatient Prescriptions  Medication Sig Dispense Refill  . albuterol (PROVENTIL HFA;VENTOLIN HFA) 108 (90 Base) MCG/ACT inhaler Inhale 2 puffs into the lungs every 6 (six) hours as needed for wheezing or shortness of breath. 1 Inhaler 6  . amLODipine (NORVASC) 5 MG tablet TAKE 1 TABLET (5 MG TOTAL) BY MOUTH EVERY MORNING. 30 tablet 8  . ANORO ELLIPTA 62.5-25 MCG/INH AEPB INHALE 1 PUFF INTO THE LUNGS DAILY. 60 each 5  . aspirin EC 81 MG tablet Take 1 tablet (81 mg total) by mouth daily. 30 tablet 11  . clopidogrel (PLAVIX) 75 MG tablet TAKE 1 TABLET (75 MG TOTAL) BY MOUTH DAILY WITH BREAKFAST. 30 tablet 8  . cyclobenzaprine (FLEXERIL) 10 MG tablet Take 10 mg by mouth 2 (two) times daily as needed for muscle spasms.   0  . hydrochlorothiazide (MICROZIDE) 12.5 MG capsule TAKE ONE CAPSULE BY MOUTH EVERY OTHER DAY 30 capsule 5  . isosorbide mononitrate (IMDUR) 30 MG 24 hr tablet Take 3 tablets (90 mg total) by mouth daily. 30 tablet 2  . levothyroxine (SYNTHROID) 100 MCG tablet Take 1 tablet (100 mcg total) by mouth daily before breakfast. 30 tablet 3  . losartan (COZAAR) 50 MG tablet Take 1.5 tablets (75 mg total) by mouth daily. 135 tablet 3  . metoprolol succinate (TOPROL-XL) 25 MG 24 hr tablet Take 1.5 tablets (37.5 mg total) by mouth daily. 135 tablet 1  . Omega-3 Fatty Acids (FISH OIL) 1200 MG CAPS Take 1,200 mg by mouth 2 (two) times daily.      No current facility-administered medications for this visit.     Socially he is married has 3 children. There is a remote tobacco history. He has tried to limit his sodium intake.  His wife also is my patient was diagnosed with recent breast cancer.  ROS General: Negative; No fevers, chills, or night sweats;  HEENT: Negative;  No changes in vision or hearing, sinus congestion, difficulty swallowing Pulmonary: Positive for COPD, with occasional wheezing;  mild shortness of breath with uphill walking Cardiovascular: See history of present illness GI: Hiatal hernia symptoms has resolved  since his  Nissen fundoplication; No nausea, vomiting, diarrhea, or abdominal pain GU: Negative; No dysuria, hematuria, or difficulty voiding Musculoskeletal: Negative; no myalgias, joint pain, or weakness Hematologic/Oncology: Negative; no easy bruising, bleeding Endocrine: Negative; no heat/cold intolerance; no diabetes Neuro: Negative; no changes in balance, headaches; occasional numbness in his hands. Skin: Negative; No rashes or skin lesions Psychiatric: Negative; No behavioral problems, depression Sleep:   Positive for increased upper airway resistance;no bruxism, restless legs, hypnogognic hallucinations, no cataplexy Other comprehensive 14 point system review is negative.   PE BP 122/62   Pulse (!) 56   Resp 16   Ht _0  (1.727 m)   Wt 209 lb 12.8 oz (95.2 kg)   SpO2 97%   BMI 31.90 kg/m    Repeat blood pressure by me 122/68  Wt Readings from Last 3 Encounters:  04/04/17 209 lb 12.8 oz (95.2 kg)  01/04/17 207 lb (93.9 kg)  10/17/16 206 lb (93.4 kg)    Physical Exam BP 122/62   Pulse (!) 56   Resp 16   Ht _1  (1.727 m)   Wt 209 lb 12.8 oz (95.2 kg)   SpO2 97%   BMI 31.90 kg/m  General: Alert, oriented, no distress.  Skin: normal turgor, no rashes, warm and dry HEENT: Normocephalic, atraumatic. Pupils equal round and reactive to light; sclera anicteric; extraocular muscles intact;  Nose without nasal septal hypertrophy Mouth/Parynx benign; Mallinpatti scale 3 Neck: No JVD, no carotid bruits; normal carotid upstroke Lungs: clear to ausculatation and percussion; no wheezing or rales Chest wall: without tenderness to palpitation Heart: PMI not displaced, RRR, s1 s2 normal, 1/6 systolic murmur, no  diastolic murmur, no rubs, gallops, thrills, or heaves Abdomen: soft, nontender; no hepatosplenomehaly, BS+; abdominal aorta nontender and not dilated by palpation. Back: no CVA tenderness Pulses 2+ Musculoskeletal: full range of motion, normal strength, no joint deformities Extremities: no clubbing cyanosis or edema, Homan's sign negative  Neurologic: grossly nonfocal; Cranial nerves grossly wnl Psychologic: Normal mood and affect   ECG (independently read by me): Sinus bradycardia 52 bpm.  Nonspecific IVCD.  Possible lateral Q waves.  QTc interval 451 ms.  May 2018 ECG (independently read by me): Sinus bradycardia at 47 bpm.  Left bundle branch block with repolarization changes.  Nose other significant ST changes.  March 2018 ECG (independently read by me):Normal sinus rhythm at 68 bpm.  Left bundle branch block with repolarization changes.  Her interval 166 ms; QTc interval 467 ms.  December 2016 ECG (independently read by me): Sinus bradycardia 58 bpm.  Left bundle branch block.  PR interval 148 ms.    June 2016 ECG (independently read by me): Sinus bradycardia 52 bpm.  Left bundle branch block with repolarization changes.  April 2016 ECG (independently read by me): Sinus rhythm at 62 bpm.  PVC.  QTc interval 475 ms.  September 2015 ECG (independently read by me): Sinus bradycardia at 46 beats per minute.  Left bundle branch block with repolarization changes  05/13/2013 ECG: Sinus bradycardia 50 beats per minute. beats per minute with mild left axis deviation and left bundle branch block. There is complete resolution of prior ectopy.   LABS  BMP Latest Ref Rng & Units 10/03/2016 10/02/2016 09/30/2016  Glucose 65 - 99 mg/dL 112(H) 103(H) 98  BUN 6 - 20 mg/dL _2 Creatinine 0.61 - 1.24 mg/dL 1.00 0.93 0.98  Sodium 135 - 145 mmol/L 139 138 138  Potassium 3.5 - 5.1 mmol/L 4.3 4.4 3.6  Chloride 101 -  111 mmol/L 108 108 103  CO2 22 - 32 mmol/L _0 Calcium 8.9 - 10.3 mg/dL  8.3(L) 8.3(L) 8.5(L)     Hepatic Function Latest Ref Rng & Units 10/03/2016 09/30/2016 09/01/2016  Total Protein 6.5 - 8.1 g/dL 5.7(L) 5.9(L) 6.2  Albumin 3.5 - 5.0 g/dL 2.8(L) 3.0(L) 3.7  AST 15 - 41 U/L _1 ALT 17 - 63 U/L 25 15(L) 12  Alk Phosphatase 38 - 126 U/L 90 91 102  Total Bilirubin 0.3 - 1.2 mg/dL 0.5 0.8 0.9    CBC Latest Ref Rng & Units 10/03/2016 10/02/2016 09/30/2016  WBC 4.0 - 10.5 K/uL 6.0 6.6 7.9  Hemoglobin 13.0 - 17.0 g/dL 12.3(L) 12.8(L) 13.3  Hematocrit 39.0 - 52.0 % 38.6(L) 40.3 41.2  Platelets 150 - 400 K/uL 261 267 292   Lab Results  Component Value Date   TSH <0.010 (L) 10/02/2016    BNP    Component Value Date/Time   PROBNP 20.0 11/03/2013 1737    Lipid Panel     Component Value Date/Time   CHOL 117 09/01/2016 0926   TRIG 94 09/01/2016 0926   HDL 32 (L) 09/01/2016 0926   CHOLHDL 3.7 09/01/2016 0926   VLDL 19 09/01/2016 0926   LDLCALC 66 09/01/2016 0926     RADIOLOGY: No results found.  IMPRESSION:  1. Abdominal aortic aneurysm (AAA) without rupture ([place])   2. Coronary artery disease due to lipid rich plaque   3. Essential hypertension   4. Hypothyroidism, unspecified type   5. UARS (upper airway resistance syndrome)     ASSESSMENT AND PLAN: Matthew. Chrishon Martino is a 66 year old white male who is 9 years status post suffering an  inferior ST segment elevation myocardial infarction for which he underwent successful stenting of his RCA and staged cutting balloon to high grade LAD stenosis.  A  nuclear perfusion study in 2016 was low risk and showed probable attenuation artifact versus possible left bundle branch block attenuation.  Importantly, there was no evidence for ischemia.  Ejection fraction was 45%, which corroborates with his EF estimate on his echo Doppler study. When I last saw him, his preoperative echo Doppler study showed an EF of 45-50% and he had grade 1 diastolic dysfunction.  He tolerated his shoulder surgery well but several days  later developed increasing shortness of breath leading to hospitalization , which most likely was related to mild volume overload.  His troponins were positive with a flat plateau.  A nuclear stress test was performed and he was referred for definitive cardiac catheterization.  This revealed stable CAD.  A widely pain RCA stented mild nonobstructive disease.  Presently, his blood pressure is controlled on losartan 75 mg, metoprolol 37.5 mg as Toprol-XL, and HCTZ 12.5 mg every other day in addition to amlodipine 5 mg.  He continues to be on dual antiplatelet therapy with aspirin and Plavix.  He's not had any recurrent anginal symptoms and continues to take isosorbide.  Recently, his levothyroxine dose was reduced forced hypothyroidism and is now on 150 g.  He takes anora ellipticall and when necessary albuterol for his lung disease.  I suspect if this potentially was related to mild volume overload.  Troponins were 0.05 with a flat plateau.  I thoroughly reviewed his sleep study with him in detail.  On the present study.  He was not found to have significant obstructive sleep apnea and only had mild increased upper resistance syndrome.  Oxygen desaturation was to 88%.  In 6 months, I'm scheduling him for follow-up abdominal and resume duplex evaluation and will see him in the office for follow-up evaluation.  Time spent: 25 minutes  Troy Sine, MD, [facility]  04/06/2017 6:41 PM

## 2017-04-04 NOTE — Patient Instructions (Signed)
Medication Instructions:  Your physician recommends that you continue on your current medications as directed. Please refer to the Current Medication list given to you today.   Labwork: none  Testing/Procedures: Your physician has requested that you have an abdominal aorta duplex. During this test, an ultrasound is used to evaluate the aorta. Allow 30 minutes for this exam. Do not eat after midnight the day before and avoid carbonated beverages. SCHEDULE IN 6 MONTHS   Follow-Up: Your physician wants you to follow-up in: 6 months with Dr. Tresa EndoKelly. You will receive a reminder letter in the mail two months in advance. If you don't receive a letter, please call our office to schedule the follow-up appointment.   Any Other Special Instructions Will Be Listed Below (If Applicable).     If you need a refill on your cardiac medications before your next appointment, please call your pharmacy.

## 2017-04-06 ENCOUNTER — Ambulatory Visit (HOSPITAL_COMMUNITY): Admission: RE | Admit: 2017-04-06 | Payer: BLUE CROSS/BLUE SHIELD | Source: Ambulatory Visit

## 2017-04-14 DIAGNOSIS — S0502XA Injury of conjunctiva and corneal abrasion without foreign body, left eye, initial encounter: Secondary | ICD-10-CM | POA: Diagnosis not present

## 2017-04-14 DIAGNOSIS — T1502XA Foreign body in cornea, left eye, initial encounter: Secondary | ICD-10-CM | POA: Diagnosis not present

## 2017-04-14 DIAGNOSIS — X58XXXA Exposure to other specified factors, initial encounter: Secondary | ICD-10-CM | POA: Diagnosis not present

## 2017-05-31 ENCOUNTER — Other Ambulatory Visit: Payer: Self-pay | Admitting: Cardiovascular Disease

## 2017-06-15 ENCOUNTER — Ambulatory Visit: Payer: BLUE CROSS/BLUE SHIELD | Admitting: Cardiovascular Disease

## 2017-06-29 ENCOUNTER — Other Ambulatory Visit: Payer: Self-pay | Admitting: Cardiovascular Disease

## 2017-06-29 MED ORDER — ISOSORBIDE MONONITRATE ER 30 MG PO TB24: 90.0000 mg | ORAL_TABLET | Freq: Every day | ORAL | 3 refills | Status: DC

## 2017-06-29 NOTE — Telephone Encounter (Signed)
Rx has been sent to the pharmacy electronically. ° °

## 2017-07-31 ENCOUNTER — Other Ambulatory Visit: Payer: Self-pay

## 2017-07-31 MED ORDER — AMLODIPINE BESYLATE 5 MG PO TABS: 5.0000 mg | ORAL_TABLET | Freq: Every day | ORAL | 3 refills | Status: DC

## 2017-08-13 DIAGNOSIS — M545 Low back pain: Secondary | ICD-10-CM | POA: Diagnosis not present

## 2017-08-13 DIAGNOSIS — I2510 Atherosclerotic heart disease of native coronary artery without angina pectoris: Secondary | ICD-10-CM | POA: Diagnosis not present

## 2017-08-15 ENCOUNTER — Other Ambulatory Visit: Payer: Self-pay | Admitting: Cardiovascular Disease

## 2017-08-15 DIAGNOSIS — I714 Abdominal aortic aneurysm, without rupture, unspecified: Secondary | ICD-10-CM

## 2017-08-16 DIAGNOSIS — M545 Low back pain: Secondary | ICD-10-CM | POA: Diagnosis not present

## 2017-08-16 DIAGNOSIS — I10 Essential (primary) hypertension: Secondary | ICD-10-CM | POA: Diagnosis not present

## 2017-08-16 DIAGNOSIS — E039 Hypothyroidism, unspecified: Secondary | ICD-10-CM | POA: Diagnosis not present

## 2017-08-16 DIAGNOSIS — Z125 Encounter for screening for malignant neoplasm of prostate: Secondary | ICD-10-CM | POA: Diagnosis not present

## 2017-08-16 DIAGNOSIS — I2510 Atherosclerotic heart disease of native coronary artery without angina pectoris: Secondary | ICD-10-CM | POA: Diagnosis not present

## 2017-09-10 ENCOUNTER — Other Ambulatory Visit: Payer: Self-pay

## 2017-09-10 MED ORDER — CLOPIDOGREL BISULFATE 75 MG PO TABS: ORAL_TABLET | ORAL | 8 refills | Status: DC

## 2017-09-20 ENCOUNTER — Ambulatory Visit (HOSPITAL_COMMUNITY)
Admission: RE | Admit: 2017-09-20 | Discharge: 2017-09-20 | Disposition: A | Discharge status: Home / Self Care | Payer: BLUE CROSS/BLUE SHIELD | Source: Ambulatory Visit | Attending: Cardiovascular Disease | Admitting: Cardiovascular Disease

## 2017-09-20 DIAGNOSIS — E785 Hyperlipidemia, unspecified: Secondary | ICD-10-CM | POA: Insufficient documentation

## 2017-09-20 DIAGNOSIS — J449 Chronic obstructive pulmonary disease, unspecified: Secondary | ICD-10-CM | POA: Insufficient documentation

## 2017-09-20 DIAGNOSIS — Z87891 Personal history of nicotine dependence: Secondary | ICD-10-CM | POA: Insufficient documentation

## 2017-09-20 DIAGNOSIS — E669 Obesity, unspecified: Secondary | ICD-10-CM | POA: Insufficient documentation

## 2017-09-20 DIAGNOSIS — I714 Abdominal aortic aneurysm, without rupture, unspecified: Secondary | ICD-10-CM

## 2017-09-20 DIAGNOSIS — I10 Essential (primary) hypertension: Secondary | ICD-10-CM | POA: Insufficient documentation

## 2017-10-02 ENCOUNTER — Encounter: Payer: Self-pay | Admitting: *Deleted

## 2017-10-02 DIAGNOSIS — Z9889 Other specified postprocedural states: Secondary | ICD-10-CM | POA: Diagnosis not present

## 2017-10-02 DIAGNOSIS — M25511 Pain in right shoulder: Secondary | ICD-10-CM | POA: Diagnosis not present

## 2017-11-13 ENCOUNTER — Other Ambulatory Visit: Payer: Self-pay | Admitting: Cardiovascular Disease

## 2017-12-02 ENCOUNTER — Other Ambulatory Visit: Payer: Self-pay | Admitting: Cardiovascular Disease

## 2017-12-08 DIAGNOSIS — Z6830 Body mass index (BMI) 30.0-30.9, adult: Secondary | ICD-10-CM | POA: Diagnosis not present

## 2017-12-08 DIAGNOSIS — L237 Allergic contact dermatitis due to plants, except food: Secondary | ICD-10-CM | POA: Diagnosis not present

## 2018-02-17 ENCOUNTER — Other Ambulatory Visit: Payer: Self-pay | Admitting: Cardiovascular Disease

## 2018-02-20 ENCOUNTER — Encounter: Payer: Self-pay | Admitting: Cardiovascular Disease

## 2018-02-20 ENCOUNTER — Ambulatory Visit (INDEPENDENT_AMBULATORY_CARE_PROVIDER_SITE_OTHER): Payer: BLUE CROSS/BLUE SHIELD | Admitting: Cardiovascular Disease

## 2018-02-20 VITALS — BP 130/62 | HR 47 | Ht 68.0 in | Wt 204.0 lb

## 2018-02-20 DIAGNOSIS — I10 Essential (primary) hypertension: Secondary | ICD-10-CM

## 2018-02-20 DIAGNOSIS — I714 Abdominal aortic aneurysm, without rupture, unspecified: Secondary | ICD-10-CM

## 2018-02-20 DIAGNOSIS — I2510 Atherosclerotic heart disease of native coronary artery without angina pectoris: Secondary | ICD-10-CM

## 2018-02-20 DIAGNOSIS — I447 Left bundle-branch block, unspecified: Secondary | ICD-10-CM | POA: Diagnosis not present

## 2018-02-20 DIAGNOSIS — I2583 Coronary atherosclerosis due to lipid rich plaque: Secondary | ICD-10-CM | POA: Diagnosis not present

## 2018-02-20 DIAGNOSIS — E785 Hyperlipidemia, unspecified: Secondary | ICD-10-CM | POA: Diagnosis not present

## 2018-02-20 DIAGNOSIS — J449 Chronic obstructive pulmonary disease, unspecified: Secondary | ICD-10-CM | POA: Diagnosis not present

## 2018-02-20 MED ORDER — HYDROCHLOROTHIAZIDE 12.5 MG PO CAPS: 12.5000 mg | ORAL_CAPSULE | ORAL | 3 refills | Status: DC

## 2018-02-20 MED ORDER — CLOPIDOGREL BISULFATE 75 MG PO TABS: ORAL_TABLET | ORAL | 3 refills | Status: DC

## 2018-02-20 MED ORDER — AMLODIPINE BESYLATE 5 MG PO TABS: 5.0000 mg | ORAL_TABLET | Freq: Every day | ORAL | 3 refills | Status: DC

## 2018-02-20 MED ORDER — ISOSORBIDE MONONITRATE ER 60 MG PO TB24: 90.0000 mg | ORAL_TABLET | Freq: Every day | ORAL | 3 refills | Status: DC

## 2018-02-20 MED ORDER — METOPROLOL SUCCINATE ER 25 MG PO TB24: 25.0000 mg | ORAL_TABLET | Freq: Every day | ORAL | 3 refills | Status: DC

## 2018-02-20 MED ORDER — LOSARTAN POTASSIUM 50 MG PO TABS: 75.0000 mg | ORAL_TABLET | Freq: Every day | ORAL | 3 refills | Status: DC

## 2018-02-20 NOTE — Patient Instructions (Signed)
Medication Instructions:  DECREASE metoprolol succinate (Toprol XL) to 25 mg daily  Follow-Up: Your physician wants you to follow-up in: 1 year with Dr. Tresa EndoKelly.  You will receive a reminder letter in the mail two months in advance. If you don't receive a letter, please call our office to schedule the follow-up appointment.   Any Other Special Instructions Will Be Listed Below (If Applicable).     If you need a refill on your cardiac medications before your next appointment, please call your pharmacy.

## 2018-02-20 NOTE — Progress Notes (Signed)
Patient ID: Matthew Evans, male   DOB: 03-23-1951, 67 y.o.   MRN: [id number]      HPI: Matthew Evans, is a 67 y.o. male who presents to the office today for an 64 month cardiology evaluation.    Mr Sterling has established CAD and suffered an ST segment elevation inferior MI and underwent RCA stenting in April 2009. He underwent staged cutting balloon to a high grade LAD stenosis which involved multiple branches and stenting was not done to reduce potential for jailing of his multiple branches. In August 2010 he was found to have 50-60% stenosis beyond his RCA stent in the LAD intervention site remained patent. He has a documented abdominal aortic aneurysm and a followup evaluation with Doppler imaging  on 08/29/2012 was essentially unchanged from one year previously and showed a measurement of 3.4x3.7 cm.  Additional problems include COPD, hypertension, hypothyroidism, hyperlipidemia, GERD. A nuclear study in February 2014 remained low risk and demonstrated mild apical hypokinesis with low normal wall motion without ischemia. An echo Doppler study in February 2014 showed mild LVH with grade 1 diastolic dysfunction and normal systolic function. He did have aortic valve thickening without stenosis and mild left atrial dilatation. Had normal pulmonary pressures.  He has a history of palpitations which has been treated with low dose  beta blocker therapy.He has COPD. He recently has been on Anora in addition to when necessary albuterol.  He underwent a Nissen fundoplication hiatal hernia surgery by Dr. Rosendo Gros in May, 2015.  Since that time, he has lost approximately 20 pounds.  In 2015 A follow-up abdominal aortic ultrasound his revealed 3.6 x 3.3 cm with a small amount of atherosclerosis visualized, which was not hemodynamically significant.  This was not significantly changed from one year previously.  He had an emergency room evaluation on 09/15/2014.  He had been working aggressively at home in his yard  intensely and overdid it.  He presented with presyncope and also had mild dyspnea.  He had noticed some mild dyspnea on exertion for the past several months.  In the emergency room he was noted to have an irregular heart rate but sinus rhythm with frequent PVCs.  PVCs were not conducted so his resting pulse radially was bradycardic.    On 09/30/2014 an echo Doppler showed an EF of 40-45% with diffuse hypokinesis and grade 2 diastolic dysfunction.  There was mitral annular calcification.  His left atrium was mildly dilated.  Right ventricle was normal.  A nuclear perfusion study done the same day was interpreted as low risk.  He has left bundle branch block.  There was a small mild fixed anterior defect consistent with soft tissue attenuation versus possible left bundle branch block abnormality.  There was no ischemia.  A follow-up abdominal ultrasound in June 2016 showed an aortic aneurysm of 3.7 x 3.5, which was not significantly changed.  At times, he has noticed rare episodes of chest pain but typically most of his symptoms are related to shortness of breath with activity.  He was supposed to be taking isosorbide 90 mg daily, but has only been taking 60 mg.  He now sees Dr.[name] for his pulmonary care.  Mr. Tibbetts has been followed for primary care by Dr. Wilson Singer.  I had recently seen him prior to him undergoing shoulder surgery by Dr. Veverly Fells.which was done on 09/25/2016.  Prior to giving him  operative clearance I recommended that he undergo a preoperativean echo Doppler study. This was done on 09/01/2016 and  showed his echo EF had slightly improved and was now in the 45-50% range. He apparently tolerated his shoulder surgery well but after going home 2 days later developed increasing shortness of breath.  He presented to the hospital.  In the hospital, he again had another echo which showed an EF of 45-50%. He was referred for a nuclear stress test which raised concern for possible ischemia in the inferior  wall and probable scar in the anteroseptal wall.  As result, he underwent cardiac catheterization was done by Dr. Ellyn Hack, which revealed a patent RCA stent and 30% LAD and circumflex narrowing.  Angiographically there was no obvious culprit lesion to explain his symptoms.  When I last saw him he denied any chest pain or shortness of breath.  He was walking daily up to a mile.  He has remote history of obstructive sleep apnea but had not use CPAP in over 3 years.  He stated that his sleep was poor and was wondering if he needs to reinstitute to to treatment.    He underwent a sleep study in 01/04/2017.  No significant sleep apnea was demonstrated with an overall AHI of 1.1.  AHI during rim sleep was 1.3.  There was minimum oxygen desaturation to 88%.  He had abnormal sleep architecture with absence of slow-wave sleep and prolonged latency to rems sleep.  It was felt that he had mild increased upper airway resistance syndrome without definitive obstructive sleep apnea.  Over the past several months.  He is sleeping better.  He is now seeing Dr. Maudie Mercury for primary care.  Since I last saw him in October 2018, he has remained stable from a cardiac standpoint.  He admits to some fatigue.  He denies chest pain or palpitations.  He has been on Toprol-XL 37.5 mg daily, losartan 75 mg daily, isosorbide 60 mg in the morning and amlodipine 5 mg for blood pressure control.  He continues to be on dual antiplatelet therapy with aspirin and Plavix.  He also takes HCTZ 12.5 mg every other day.  He is on levothyroxine for hypothyroidism.  He takes an oral Ellipta for his COPD.  He presents for reevaluation.  By me  Past Medical History:  Diagnosis Date  . AAA (abdominal aortic aneurysm) (HCC)    mild; doppler 08/29/12-3.4x3.7  . Arthritis    "hands, back" (09/28/2016)  . CAD (coronary artery disease)    echo 07/26/12-EF 55-60%; myoview 07/26/12-low normal wall motion with mild apical hypokinesis as well a septal wall motion  consistent with LBBB  . Carotid artery occlusion   . Chronic lower back pain   . Cutaneous lupus erythematosus    [name], [name]  . Dementia    "moderate; dx'd in ~ 2011" (09/28/2016)  . Dermatitis   . Emphysema   . GERD (gastroesophageal reflux disease)   . H/O hiatal hernia   . Headache(784.0)   . History of palpitations   . HTN (hypertension)   . Hyperlipidemia   . Hypothyroidism    HX OF RADIOACTIVE IODINE   . Memory loss    d/t mini stroke  . Mini stroke ([place]) ~ 2011  . Myocardial infarction ([place]) 09/2007   PCI to RCA  . Shortness of breath    WITH EXERTION  . Sleep apnea    sleep study 12/14/06 ahi 16.89, during rem 12.3, supine ahi 57.49; cpap 02/22/07-c-flex of 3 at 13cm h2o; PT  STATES HE DOES NOT USE HIS CPAP EVERY NIGHT  . Sleep apnea    "aien't worn mask in 3 years" (09/28/2016)  . Stroke [facility])    PT STATES NEUROLOGIST TOLD HIM HE HAD HAD MINI STROKE  . Thyroid disease    hypothyroidism    Past Surgical History:  Procedure Laterality Date  . balloon atherotomy     LAD  . BICEPS TENDON REPAIR Right 09/25/2016   "cleaned out spurs and arthritis" (09/28/2016)  . CARDIAC CATHETERIZATION  08/11/11   EF50-55%, small AAA, no signifiant stent restenosis in RCA , no restenosis of PTCA of LAD, medical therapy  . CARDIAC CATHETERIZATION  09/27/06   cutting balloon arthrotomy of LAD stenosis with dilatation of a 3.5x15m cutting balloon  . CARDIAC CATHETERIZATION  01/07/09   50-60% progressive dz beyond the stented segment in the RCA , LAD intervention remained patent  . CORONARY ANGIOPLASTY WITH STENT PLACEMENT  09/24/06   PTCA/stent of RCA with a 3.5x223mTaxus DES post dilated to 3.9627m. CORONARY ANGIOPLASTY WITH STENT PLACEMENT Right 09/2007  . ESOPHAGEAL MANOMETRY N/A 08/04/2013   Procedure: ESOPHAGEAL MANOMETRY (EM);  Surgeon: PatBeryle BeamsD;  Location: [facility];  Service: Endoscopy;   Laterality: N/A;  . HERNIA REPAIR    . HIATAL HERNIA REPAIR  2011?  . LMarland KitchenFT HEART CATH AND CORONARY ANGIOGRAPHY N/A 10/02/2016   Procedure: Left Heart Cath and Coronary Angiography;  Surgeon: DavLeonie ManD;  Location: [facility];  Service: Cardiovascular;  Laterality: N/A;  . LEFT HEART CATHETERIZATION WITH CORONARY ANGIOGRAM N/A 08/11/2011   Procedure: LEFT HEART CATHETERIZATION WITH CORONARY ANGIOGRAM;  Surgeon: ThoTroy SineD;  [facility];  Service: Cardiovascular;  Laterality: N/A;    Allergies  Allergen Reactions  . Niaspan [Niacin] Rash  . Septra [Sulfamethoxazole-Trimethoprim] Rash  . Simcor [Niacin-Simvastatin Er] Rash  . Zocor [Simvastatin] Rash    Current Outpatient Medications  Medication Sig Dispense Refill  . albuterol (PROVENTIL HFA;VENTOLIN HFA) 108 (90 Base) MCG/ACT inhaler Inhale 2 puffs into the lungs every 6 (six) hours as needed for wheezing or shortness of breath. 1 Inhaler 6  . amLODipine (NORVASC) 5 MG tablet Take 1 tablet (5 mg total) by mouth daily. 90 tablet 3  . ANORO ELLIPTA 62.5-25 MCG/INH AEPB INHALE 1 PUFF INTO THE LUNGS DAILY. 60 each 5  . aspirin 81 MG EC tablet TAKE 1 TABLET BY MOUTH EVERY DAY 90 tablet 3  . clopidogrel (PLAVIX) 75 MG tablet TAKE 1 TABLET (75 MG TOTAL) BY MOUTH DAILY WITH BREAKFAST. 90 tablet 3  . cyclobenzaprine (FLEXERIL) 10 MG tablet Take 10 mg by mouth 2 (two) times daily as needed for muscle spasms.   0  . hydrochlorothiazide (MICROZIDE) 12.5 MG capsule Take 1 capsule (12.5 mg total) by mouth every other day. 45 capsule 3  . isosorbide mononitrate (IMDUR) 60 MG 24 hr tablet Take 1.5 tablets (90 mg total) by mouth daily. 135 tablet 3  . levothyroxine (SYNTHROID) 100 MCG tablet Take 1 tablet (100 mcg total) by mouth daily before breakfast. 30 tablet 3  . losartan (COZAAR) 50 MG tablet Take 1.5 tablets (75 mg total) by mouth daily. 135 tablet 3  . metoprolol succinate (TOPROL-XL) 25 MG 24 hr tablet Take 1  tablet (25 mg total) by mouth daily. 90 tablet 3  . Omega-3 Fatty Acids (FISH OIL) 1200 MG CAPS Take 1,200 mg by mouth 2 (two) times daily.      No current  facility-administered medications for this visit.     Socially he is married has 3 children. There is a remote tobacco history. He has tried to limit his sodium intake.  His wife also is my patient was diagnosed with recent breast cancer.  ROS General: Negative; No fevers, chills, or night sweats;  HEENT: Negative; No changes in vision or hearing, sinus congestion, difficulty swallowing Pulmonary: Positive for COPD, with occasional wheezing;  mild shortness of breath with uphill walking Cardiovascular: See history of present illness GI: Hiatal hernia symptoms has resolved  since his Nissen fundoplication; No nausea, vomiting, diarrhea, or abdominal pain GU: Negative; No dysuria, hematuria, or difficulty voiding Musculoskeletal: Negative; no myalgias, joint pain, or weakness Hematologic/Oncology: Negative; no easy bruising, bleeding Endocrine: Negative; no heat/cold intolerance; no diabetes Neuro: Negative; no changes in balance, headaches; occasional numbness in his hands. Skin: Negative; No rashes or skin lesions Psychiatric: Negative; No behavioral problems, depression Sleep:   Positive for increased upper airway resistance;no bruxism, restless legs, hypnogognic hallucinations, no cataplexy Other comprehensive 14 point system review is negative.   PE BP 130/62 (BP Location: Right Arm, Patient Position: Sitting, Cuff Size: Normal)   Pulse (!) 47   Ht '5\' 8"'  (1.727 m)   Wt 204 lb (92.5 kg)   BMI 31.02 kg/m    Repeat blood pressure by me was 126/64  Wt Readings from Last 3 Encounters:  02/20/18 204 lb (92.5 kg)  04/04/17 209 lb 12.8 oz (95.2 kg)  01/04/17 207 lb (93.9 kg)   General: Alert, oriented, no distress.  Skin: normal turgor, no rashes, warm and dry; mild ecchymoses on forearm HEENT: Normocephalic, atraumatic.  Pupils equal round and reactive to light; sclera anicteric; extraocular muscles intact;  Nose without nasal septal hypertrophy Mouth/Parynx benign; Mallinpatti scale 3 Neck: No JVD, no carotid bruits; normal carotid upstroke Lungs: clear to ausculatation and percussion; no wheezing or rales Chest wall: without tenderness to palpitation Heart: PMI not displaced, RRR, s1 s2 normal, 1/6 systolic murmur, no diastolic murmur, no rubs, gallops, thrills, or heaves Abdomen: soft, nontender; no hepatosplenomehaly, BS+; abdominal aorta nontender and not dilated by palpation. Back: no CVA tenderness Pulses 2+ Musculoskeletal: full range of motion, normal strength, no joint deformities Extremities: no clubbing cyanosis or edema, Homan's sign negative  Neurologic: grossly nonfocal; Cranial nerves grossly wnl Psychologic: Normal mood and affect   ECG (independently read by me): Sinus bradycardia 47 bpm.  Left bundle branch block with repolarization changes.  October 2018 ECG (independently read by me): Sinus bradycardia 52 bpm.  Nonspecific IVCD.  Possible lateral Q waves.  QTc interval 451 ms.  May 2018 ECG (independently read by me): Sinus bradycardia at 47 bpm.  Left bundle branch block with repolarization changes.  Nose other significant ST changes.  March 2018 ECG (independently read by me):Normal sinus rhythm at 68 bpm.  Left bundle branch block with repolarization changes.  Her interval 166 ms; QTc interval 467 ms.  December 2016 ECG (independently read by me): Sinus bradycardia 58 bpm.  Left bundle branch block.  PR interval 148 ms.    June 2016 ECG (independently read by me): Sinus bradycardia 52 bpm.  Left bundle branch block with repolarization changes.  April 2016 ECG (independently read by me): Sinus rhythm at 62 bpm.  PVC.  QTc interval 475 ms.  September 2015 ECG (independently read by me): Sinus bradycardia at 46 beats per minute.  Left bundle branch block with repolarization  changes  05/13/2013 ECG: Sinus bradycardia 50 beats per  minute. beats per minute with mild left axis deviation and left bundle branch block. There is complete resolution of prior ectopy.   LABS  BMP Latest Ref Rng & Units 10/03/2016 10/02/2016 09/30/2016  Glucose 65 - 99 mg/dL 112(H) 103(H) 98  BUN 6 - 20 mg/dL '11 11 15  ' Creatinine 0.61 - 1.24 mg/dL 1.00 0.93 0.98  Sodium 135 - 145 mmol/L 139 138 138  Potassium 3.5 - 5.1 mmol/L 4.3 4.4 3.6  Chloride 101 - 111 mmol/L 108 108 103  CO2 22 - 32 mmol/L '26 25 26  ' Calcium 8.9 - 10.3 mg/dL 8.3(L) 8.3(L) 8.5(L)     Hepatic Function Latest Ref Rng & Units 10/03/2016 09/30/2016 09/01/2016  Total Protein 6.5 - 8.1 g/dL 5.7(L) 5.9(L) 6.2  Albumin 3.5 - 5.0 g/dL 2.8(L) 3.0(L) 3.7  AST 15 - 41 U/L '31 24 14  ' ALT 17 - 63 U/L 25 15(L) 12  Alk Phosphatase 38 - 126 U/L 90 91 102  Total Bilirubin 0.3 - 1.2 mg/dL 0.5 0.8 0.9    CBC Latest Ref Rng & Units 10/03/2016 10/02/2016 09/30/2016  WBC 4.0 - 10.5 K/uL 6.0 6.6 7.9  Hemoglobin 13.0 - 17.0 g/dL 12.3(L) 12.8(L) 13.3  Hematocrit 39.0 - 52.0 % 38.6(L) 40.3 41.2  Platelets 150 - 400 K/uL 261 267 292   Lab Results  Component Value Date   TSH <0.010 (L) 10/02/2016    BNP    Component Value Date/Time   PROBNP 20.0 11/03/2013 1737    Lipid Panel     Component Value Date/Time   CHOL 117 09/01/2016 0926   TRIG 94 09/01/2016 0926   HDL 32 (L) 09/01/2016 0926   CHOLHDL 3.7 09/01/2016 0926   VLDL 19 09/01/2016 0926   LDLCALC 66 09/01/2016 0926     RADIOLOGY: No results found.  IMPRESSION:  1. Coronary artery disease due to lipid rich plaque   2. Essential hypertension   3. Left bundle branch block   4. Hyperlipidemia with target LDL less than 70   5. Abdominal aortic aneurysm without rupture ([place])   6. Chronic obstructive pulmonary disease, unspecified COPD type ([place])     ASSESSMENT AND PLAN: Mr. Zachery Niswander is a 67 year old white male who is 10 years status post suffering an  inferior ST  segment elevation myocardial infarction for which he underwent successful stenting of his RCA and staged cutting balloon to high grade LAD stenosis in April 2009..  A  nuclear perfusion study in 2016 was low risk and showed probable attenuation artifact versus possible left bundle branch block attenuation.  Importantly, there was no evidence for ischemia.  Ejection fraction was 45%, which corroborates with his EF estimate on his echo Doppler study. A  preoperative echo Doppler study showed an EF of 45-50% and he had grade 1 diastolic dysfunction.  He tolerated his shoulder surgery well but several days later developed increasing shortness of breath leading to hospitalization , which most likely was related to mild volume overload.  His troponins were positive with a flat plateau.  A nuclear stress test was performed and he was referred for definitive cardiac catheterization.  This revealed stable CAD; he had a widely patent RCA stent with mild nonobstructive disease.  His blood pressure today is stable on his current regimen.  He is now on an oral Ellipta for his COPD.  There is no wheezing.  ECG shows significant sinus bradycardia with a ventricular rate at 47 bpm with left bundle branch block.  He has  noticed some fatigability.  I will reduce Toprol to 25 mg daily.  He is not having any anginal symptomatology with reference to his CAD.  His COPD is controlled with an oral Ellipta.  I reviewed his most recent abdominal aortic study to reevaluate his known abdominal aortic aneurysm which was done in April 2019.  His abdominal aorta measured 3.3 cm and was not significantly changed compared to prior examination.  He continues to be on levothyroxine 100 mcg daily.  I renewed his medications for a year.  I reviewed the laboratory done by Dr. Maudie Mercury from November 21, 2017.  Cholesterol was 122, triglycerides 75, LDL 73, and HDL 35.  TSH was 1.03.  He will follow-up with Dr. Maudie Mercury for primary care.  I will see him in 1 year for  reevaluation.  Time spent: 25 minutes  Troy Sine, MD, [facility]  02/20/2018 6:19 PM

## 2018-03-05 DIAGNOSIS — M25511 Pain in right shoulder: Secondary | ICD-10-CM | POA: Diagnosis not present

## 2018-03-16 DIAGNOSIS — M25511 Pain in right shoulder: Secondary | ICD-10-CM | POA: Diagnosis not present

## 2018-03-19 DIAGNOSIS — Z9889 Other specified postprocedural states: Secondary | ICD-10-CM | POA: Diagnosis not present

## 2018-03-19 DIAGNOSIS — M25511 Pain in right shoulder: Secondary | ICD-10-CM | POA: Diagnosis not present

## 2018-03-25 ENCOUNTER — Telehealth: Payer: Self-pay | Admitting: Cardiovascular Disease

## 2018-03-25 NOTE — Telephone Encounter (Signed)
   [facility] Pre-operative Risk Assessment    Request for surgical clearance:  1. What type of surgery is being performed? Right shoulder scope, mini-open cuff repair   2. When is this surgery scheduled? TBD   3. What type of clearance is required (medical clearance vs. Pharmacy clearance to hold med vs. Both)? Both  4. Are there any medications that need to be held prior to surgery and how long? Plavix   5. Practice name and name of physician performing surgery? Dr. Veverly Fells @ [facility]   6. What is your office phone number [phone]    7.   What is your office fax number [phone] (Attn: Santiago Bur)  8.   Anesthesia type (None, local, MAC, general) ? Choice/ISB   Sheral Apley M 03/25/2018, 12:36 PM  _________________________________________________________________   (provider comments below)

## 2018-03-26 DIAGNOSIS — E039 Hypothyroidism, unspecified: Secondary | ICD-10-CM | POA: Diagnosis not present

## 2018-03-26 DIAGNOSIS — I10 Essential (primary) hypertension: Secondary | ICD-10-CM | POA: Diagnosis not present

## 2018-03-26 DIAGNOSIS — M545 Low back pain: Secondary | ICD-10-CM | POA: Diagnosis not present

## 2018-03-27 NOTE — Telephone Encounter (Signed)
   Primary Cardiologist: Nicki Guadalajara, MD  Chart reviewed as part of pre-operative protocol coverage. Patient was contacted 03/27/2018 in reference to pre-operative risk assessment for pending surgery as outlined below.  Matthew Evans was last seen on 02/20/2018 by Dr. Tresa Endo.  Since that day, ZYIR GASSERT has done well without any chest pain or SOB.  Therefore, based on ACC/AHA guidelines, the patient would be at acceptable risk for the planned procedure without further cardiovascular testing.   I will route this recommendation to the requesting party via Epic fax function and remove from pre-op pool.  Please call with questions.  Per Dr. Tresa Endo, "Okay to hold aspirin and Plavix for 5 days for his planned surgery."  Azalee Course, PA 03/27/2018, 12:12 PM

## 2018-03-27 NOTE — Telephone Encounter (Signed)
Patient was last seen on 02/20/2018. Last cath was 10/02/2016 which showed patent Prox RCA stent and LAD PTCA site, no culprit lesion. Dr. Tresa Endo, please comment on whether it is ok to hold aspirin and plavix and how long?  Please forward your response to P CV DIV PREOP

## 2018-03-27 NOTE — Telephone Encounter (Signed)
Okay to hold aspirin and Plavix for 5 days for his planned surgery.  CV

## 2018-04-17 DIAGNOSIS — S46011A Strain of muscle(s) and tendon(s) of the rotator cuff of right shoulder, initial encounter: Secondary | ICD-10-CM | POA: Diagnosis not present

## 2018-04-17 DIAGNOSIS — X58XXXA Exposure to other specified factors, initial encounter: Secondary | ICD-10-CM | POA: Diagnosis not present

## 2018-04-17 DIAGNOSIS — M24111 Other articular cartilage disorders, right shoulder: Secondary | ICD-10-CM | POA: Diagnosis not present

## 2018-04-17 DIAGNOSIS — Y999 Unspecified external cause status: Secondary | ICD-10-CM | POA: Diagnosis not present

## 2018-04-17 DIAGNOSIS — M24511 Contracture, right shoulder: Secondary | ICD-10-CM | POA: Diagnosis not present

## 2018-04-17 DIAGNOSIS — M659 Synovitis and tenosynovitis, unspecified: Secondary | ICD-10-CM | POA: Diagnosis not present

## 2018-04-29 DIAGNOSIS — M25511 Pain in right shoulder: Secondary | ICD-10-CM | POA: Diagnosis not present

## 2018-05-06 DIAGNOSIS — M25511 Pain in right shoulder: Secondary | ICD-10-CM | POA: Diagnosis not present

## 2018-05-09 ENCOUNTER — Telehealth: Payer: Self-pay | Admitting: Pulmonary Disease

## 2018-05-09 ENCOUNTER — Other Ambulatory Visit: Payer: Self-pay | Admitting: Pulmonary Disease

## 2018-05-09 DIAGNOSIS — J449 Chronic obstructive pulmonary disease, unspecified: Secondary | ICD-10-CM

## 2018-05-09 MED ORDER — UMECLIDINIUM-VILANTEROL 62.5-25 MCG/INH IN AEPB: INHALATION_SPRAY | RESPIRATORY_TRACT | 0 refills | Status: DC

## 2018-05-09 NOTE — Telephone Encounter (Signed)
LMTCB. Patient has not been seen in over a year and can only have a 30 day supply until he is seen in the office.

## 2018-05-09 NOTE — Telephone Encounter (Signed)
Requested Prescriptions   Signed Prescriptions Disp Refills  . umeclidinium-vilanterol (ANORO ELLIPTA) 62.5-25 MCG/INH AEPB 60 each 0    Sig: INHALE 1 PUFF INTO THE LUNGS DAILY.  Needs office visit for refills.    Authorizing Provider: Oretha MilchALVA, RAKESH V    Ordering User: Lowell BoutonJONES, JESSICA E   Received faxed refill request for Anoro from [facility] Patient has not been seen in the office since 10/2016, with recs to follow up in 6 months #1 refill sent with note that patient MUST BE SEEN IN THE OFFICE for refills

## 2018-05-10 NOTE — Telephone Encounter (Signed)
Called and spoke with pt. Stated to pt that we did send in a 30-day supply of the anoro on 05/09/18 but until he scheduled an appt, we could not refill the med anymore for him after this time. Pt expressed understanding. Pt stated he would have his wife call back to schedule him an appt as he recently had shoulder surgery and she would have to bring him to the appt.  Will leave encounter open until pt's wife Matthew Evans or pt returns call to schedule the appt.  Once pt or wife Matthew Evans calls office back, please schedule pt a follow up appt. Thanks!

## 2018-05-13 DIAGNOSIS — M25511 Pain in right shoulder: Secondary | ICD-10-CM | POA: Diagnosis not present

## 2018-05-13 NOTE — Telephone Encounter (Signed)
Patient has appt with Dr. Vassie LollAlva on 05/22/2018 at 1545.  Nothing further needed . Will route to Cherina to follow up on at appointment to reorder Anoro as a 90day supply

## 2018-05-20 DIAGNOSIS — M25511 Pain in right shoulder: Secondary | ICD-10-CM | POA: Diagnosis not present

## 2018-05-22 ENCOUNTER — Ambulatory Visit (INDEPENDENT_AMBULATORY_CARE_PROVIDER_SITE_OTHER): Payer: BLUE CROSS/BLUE SHIELD | Admitting: Pulmonary Disease

## 2018-05-22 ENCOUNTER — Encounter: Payer: Self-pay | Admitting: Pulmonary Disease

## 2018-05-22 DIAGNOSIS — R911 Solitary pulmonary nodule: Secondary | ICD-10-CM | POA: Diagnosis not present

## 2018-05-22 DIAGNOSIS — I2510 Atherosclerotic heart disease of native coronary artery without angina pectoris: Secondary | ICD-10-CM

## 2018-05-22 DIAGNOSIS — I2583 Coronary atherosclerosis due to lipid rich plaque: Secondary | ICD-10-CM

## 2018-05-22 DIAGNOSIS — J432 Centrilobular emphysema: Secondary | ICD-10-CM

## 2018-05-22 MED ORDER — FLUTICASONE-UMECLIDIN-VILANT 100-62.5-25 MCG/INH IN AEPB: 1.0000 | INHALATION_SPRAY | Freq: Every day | RESPIRATORY_TRACT | 0 refills | Status: DC

## 2018-05-22 NOTE — Assessment & Plan Note (Signed)
Schedule CT chest without contrast to follow-up on right lung nodule

## 2018-05-22 NOTE — Patient Instructions (Signed)
Sample of Trelegy-call us back in 2 weeks to let us know and we will call in prescription.    If this does not work, then [facility] Refills on albuterol MDI.  Schedule CT chest without contrast to follow-up on right lung nodule

## 2018-05-22 NOTE — Assessment & Plan Note (Signed)
Sample of Trelegy-call us back in 2 weeks to let us know and we will call in prescription.    If this does not work, then [facility] Refills on albuterol MDI to use as needed

## 2018-05-22 NOTE — Progress Notes (Signed)
   Subjective:    Patient ID: Matthew Evans, male    DOB: 01/19/1951, 67 y.o.   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 Follow-up    Pt states that his SOB has increased   Follows up after 18 months. He is maintained on metoprolol low-dose and has bradycardia and feels sluggish.  He underwent another rotator cuff surgery, this time was uneventful, in 09/2016 this was complicated by non-STEMI  Breathing overall is stable.  He complains of increased dyspnea walking up an incline.  Anoro has helped but he wonders if triple therapy would be better.  He has run out of albuterol and does not use this much  We reviewed CT chest from 09/2016 today  Significant tests/ events reviewed  PFT's 2014: FEV1 2.04 (68%), +15% increase with BD, no restriction, normal DLCO, +airtrapping.   08/2016 Spirometry shows similar lung function with FEV1 at 68%, ratio 73, FVC 69%   CT angio 09/2016 nodular scarring right upper lobe 5 mm  Review of Systems neg for any significant sore throat, dysphagia, itching, sneezing, nasal congestion or excess/ purulent secretions, fever, chills, sweats, unintended wt loss, pleuritic or exertional cp, hempoptysis, orthopnea pnd or change in chronic leg swelling. Also denies presyncope, palpitations, heartburn, abdominal pain, nausea, vomiting, diarrhea or change in bowel or urinary habits, dysuria,hematuria, rash, arthralgias, visual complaints, headache, numbness weakness or ataxia.     Objective:   Physical Exam  Gen. Pleasant, obese, in no distress ENT - no lesions, no post nasal drip Neck: No JVD, no thyromegaly, no carotid bruits Lungs: no use of accessory muscles, no dullness to percussion, decreased without rales or rhonchi  Cardiovascular: Rhythm regular, heart sounds  normal, no murmurs or gallops, no peripheral  edema Musculoskeletal: No deformities, no cyanosis or clubbing , no tremors       Assessment & Plan:

## 2018-05-22 NOTE — Addendum Note (Signed)
Addended by: Maurene CapesPOTTS, Samauri Kellenberger M on: 05/22/2018 04:24 PM   Modules accepted: Orders

## 2018-05-23 ENCOUNTER — Telehealth: Payer: Self-pay | Admitting: Pulmonary Disease

## 2018-05-23 MED ORDER — FLUTICASONE-UMECLIDIN-VILANT 100-62.5-25 MCG/INH IN AEPB: 1.0000 | INHALATION_SPRAY | Freq: Every day | RESPIRATORY_TRACT | 0 refills | Status: DC

## 2018-05-23 MED ORDER — ALBUTEROL SULFATE 108 (90 BASE) MCG/ACT IN AEPB: 2.0000 | INHALATION_SPRAY | Freq: Four times a day (QID) | RESPIRATORY_TRACT | 5 refills | Status: DC | PRN

## 2018-05-23 NOTE — Telephone Encounter (Signed)
After looking at pt's meds, I did see where the albuterol inhaler was not refilled. I did send Rx to pt's pharmacy but had to make it the proair respiclick as this one is the inhaler that is covered by pt's insurance.  Called and spoke with pt letting him know that this was done for him. Pt expressed understanding and also asked for the trelegy Rx to be sent in to pharmacy for him.  I also sent Rx of trelegy to pt's pharmacy. Nothing further needed.

## 2018-05-30 ENCOUNTER — Ambulatory Visit (HOSPITAL_BASED_OUTPATIENT_CLINIC_OR_DEPARTMENT_OTHER)
Admission: RE | Admit: 2018-05-30 | Discharge: 2018-05-30 | Disposition: A | Discharge status: Home / Self Care | Payer: BLUE CROSS/BLUE SHIELD | Source: Ambulatory Visit | Attending: Pulmonary Disease | Admitting: Pulmonary Disease

## 2018-05-30 DIAGNOSIS — R911 Solitary pulmonary nodule: Secondary | ICD-10-CM | POA: Diagnosis not present

## 2018-06-03 ENCOUNTER — Telehealth: Payer: Self-pay | Admitting: Pulmonary Disease

## 2018-06-03 NOTE — Telephone Encounter (Signed)
Called and spoke with patient, he is requesting results from CT scan done on 05/30/18. RA please advise, thank you.

## 2018-06-06 ENCOUNTER — Telehealth: Payer: Self-pay | Admitting: Pulmonary Disease

## 2018-06-06 DIAGNOSIS — M25511 Pain in right shoulder: Secondary | ICD-10-CM | POA: Diagnosis not present

## 2018-06-06 NOTE — Telephone Encounter (Signed)
6 mm right upper lobe nodule is stable which is good news. Repeat CT scan in 1 year

## 2018-06-06 NOTE — Telephone Encounter (Signed)
Spoke with pt's wife, Larita Fife. She is aware of results. Nothing further was needed.

## 2018-06-06 NOTE — Telephone Encounter (Signed)
Attempted to contact pt. I did not receive an answer. I have left a message for pt to return our call.  

## 2018-06-06 NOTE — Telephone Encounter (Signed)
Call made to patient, his wife answered, she would like to speak to RA about his lung nodule. She said the nurse tried to explain but that did not tell her a lot. She is requesting to speak to RA personally.   RA please advise. Thanks.

## 2018-06-06 NOTE — Telephone Encounter (Signed)
Pt's wife is calling back [phone]

## 2018-06-10 NOTE — Telephone Encounter (Signed)
RA please advise when you've spoken to pt/spouse.  Thanks!

## 2018-06-12 NOTE — Telephone Encounter (Signed)
RA Please advise if you have spoken with patient's wife regarding lung nodule:  IMPRESSION: Small nodule noted along the right minor fissure measuring 6 mm. This could be followed with repeat CT in 18-24 months from the original study performed 09/28/2016.  Moderate coronary artery disease.  Aortic Atherosclerosis (ICD10-I70.0) and Emphysema (ICD10-J43.9).   Electronically Signed   By: Charlett Nose M.D.   On: 05/30/2018 17:14  Thanks.

## 2018-06-12 NOTE — Telephone Encounter (Signed)
Noted. If pt's wife returns call based on VM left, we will go from there to try to have her/pt contacted by RA.

## 2018-06-12 NOTE — Telephone Encounter (Signed)
Called her on 1/7, went to voicemail

## 2018-06-17 NOTE — Telephone Encounter (Signed)
LMTCB

## 2018-06-18 ENCOUNTER — Telehealth: Payer: Self-pay | Admitting: Pulmonary Disease

## 2018-06-18 NOTE — Telephone Encounter (Signed)
Attempted to call Patient.  Left message to call back. 

## 2018-06-18 NOTE — Telephone Encounter (Signed)
Larita Fife returned call and states she does not need to talk to RA anymore.

## 2018-06-18 NOTE — Telephone Encounter (Signed)
.  Called Matthew Evans but there was no answer. Left voicemail for her to call back if she still needed to talk to RA.

## 2018-06-19 DIAGNOSIS — Z79891 Long term (current) use of opiate analgesic: Secondary | ICD-10-CM | POA: Diagnosis not present

## 2018-06-19 DIAGNOSIS — R5382 Chronic fatigue, unspecified: Secondary | ICD-10-CM | POA: Diagnosis not present

## 2018-06-19 DIAGNOSIS — E039 Hypothyroidism, unspecified: Secondary | ICD-10-CM | POA: Diagnosis not present

## 2018-06-19 DIAGNOSIS — G8929 Other chronic pain: Secondary | ICD-10-CM | POA: Diagnosis not present

## 2018-06-19 DIAGNOSIS — E789 Disorder of lipoprotein metabolism, unspecified: Secondary | ICD-10-CM | POA: Diagnosis not present

## 2018-06-19 NOTE — Telephone Encounter (Signed)
Attempted to contact pt's wife, Lynn. I did not receive an answer. I have left a message for her to return our call.  

## 2018-06-20 MED ORDER — FLUTICASONE-UMECLIDIN-VILANT 100-62.5-25 MCG/INH IN AEPB: 1.0000 | INHALATION_SPRAY | Freq: Every day | RESPIRATORY_TRACT | 5 refills | Status: DC

## 2018-06-20 NOTE — Telephone Encounter (Signed)
Pt's wife is returning call. Per wife the pt is requesting an Rx for Trelegy to be sent to Pharm [facility]. Pt is requesting a 65mo supply to last to next OV. Cb is [phone].

## 2018-06-20 NOTE — Telephone Encounter (Signed)
Attempted to contact pt's wife, Larita Fife. I did not receive an answer. I have left a message for her to return our call.

## 2018-06-20 NOTE — Telephone Encounter (Signed)
I spoke with pt's wife, they like the Trelegy better and would like the Rx to be sent into [facility]. I sent Rx and she is aware. Nothing further is needed.    Patient Instructions by Oretha Milch, MD at 05/22/2018 3:45 PM  Author: Oretha Milch, MD Author Type: Physician Filed: 05/22/2018 4:16 PM  Note Status: Signed Cosign: Cosign Not Required Encounter Date: 05/22/2018  Editor: Oretha Milch, MD (Physician)    Sample of Trelegy-call us back in 2 weeks to let us know and we will call in prescription.    If this does not work, then we will call in [facility] refills Refills on albuterol MDI.  Schedule CT chest without contrast to follow-up on right lung nodule

## 2018-07-17 DIAGNOSIS — R21 Rash and other nonspecific skin eruption: Secondary | ICD-10-CM | POA: Diagnosis not present

## 2018-07-17 DIAGNOSIS — L299 Pruritus, unspecified: Secondary | ICD-10-CM | POA: Diagnosis not present

## 2018-09-04 ENCOUNTER — Emergency Department (HOSPITAL_COMMUNITY): Payer: BLUE CROSS/BLUE SHIELD

## 2018-09-04 ENCOUNTER — Emergency Department (HOSPITAL_COMMUNITY)
Admission: EM | Admit: 2018-09-04 | Discharge: 2018-09-05 | Disposition: A | Discharge status: Home / Self Care | Payer: BLUE CROSS/BLUE SHIELD | Attending: Emergency Medicine | Admitting: Emergency Medicine

## 2018-09-04 ENCOUNTER — Encounter (HOSPITAL_COMMUNITY): Payer: Self-pay | Admitting: Emergency Medicine

## 2018-09-04 ENCOUNTER — Other Ambulatory Visit: Payer: Self-pay

## 2018-09-04 DIAGNOSIS — R05 Cough: Secondary | ICD-10-CM | POA: Diagnosis not present

## 2018-09-04 DIAGNOSIS — I2510 Atherosclerotic heart disease of native coronary artery without angina pectoris: Secondary | ICD-10-CM | POA: Insufficient documentation

## 2018-09-04 DIAGNOSIS — I10 Essential (primary) hypertension: Secondary | ICD-10-CM | POA: Insufficient documentation

## 2018-09-04 DIAGNOSIS — R110 Nausea: Secondary | ICD-10-CM | POA: Diagnosis not present

## 2018-09-04 DIAGNOSIS — Z79899 Other long term (current) drug therapy: Secondary | ICD-10-CM | POA: Insufficient documentation

## 2018-09-04 DIAGNOSIS — F0390 Unspecified dementia without behavioral disturbance: Secondary | ICD-10-CM | POA: Insufficient documentation

## 2018-09-04 DIAGNOSIS — Z87891 Personal history of nicotine dependence: Secondary | ICD-10-CM | POA: Diagnosis not present

## 2018-09-04 DIAGNOSIS — Z955 Presence of coronary angioplasty implant and graft: Secondary | ICD-10-CM | POA: Diagnosis not present

## 2018-09-04 DIAGNOSIS — E039 Hypothyroidism, unspecified: Secondary | ICD-10-CM | POA: Diagnosis not present

## 2018-09-04 DIAGNOSIS — R509 Fever, unspecified: Secondary | ICD-10-CM | POA: Diagnosis not present

## 2018-09-04 DIAGNOSIS — Z7982 Long term (current) use of aspirin: Secondary | ICD-10-CM | POA: Diagnosis not present

## 2018-09-04 DIAGNOSIS — J988 Other specified respiratory disorders: Secondary | ICD-10-CM | POA: Diagnosis not present

## 2018-09-04 DIAGNOSIS — Z7902 Long term (current) use of antithrombotics/antiplatelets: Secondary | ICD-10-CM | POA: Insufficient documentation

## 2018-09-04 DIAGNOSIS — R197 Diarrhea, unspecified: Secondary | ICD-10-CM | POA: Diagnosis not present

## 2018-09-04 DIAGNOSIS — R0602 Shortness of breath: Secondary | ICD-10-CM | POA: Diagnosis not present

## 2018-09-04 DIAGNOSIS — Z8673 Personal history of transient ischemic attack (TIA), and cerebral infarction without residual deficits: Secondary | ICD-10-CM | POA: Diagnosis not present

## 2018-09-04 LAB — BASIC METABOLIC PANEL
Anion gap: 13 (ref 5–15)
BUN: 22 mg/dL (ref 8–23)
CO2: 23 mmol/L (ref 22–32)
Calcium: 8.5 mg/dL — ABNORMAL LOW (ref 8.9–10.3)
Chloride: 100 mmol/L (ref 98–111)
Creatinine, Ser: 1.33 mg/dL — ABNORMAL HIGH (ref 0.61–1.24)
GFR calc Af Amer: >60 mL/min (ref >60)
GFR calc non Af Amer: 55 mL/min — ABNORMAL LOW (ref >60)
Glucose, Bld: 112 mg/dL — ABNORMAL HIGH (ref 70–99)
Potassium: 4.4 mmol/L (ref 3.5–5.1)
Sodium: 136 mmol/L (ref 135–145)

## 2018-09-04 LAB — CBC WITH DIFFERENTIAL/PLATELET
Abs Immature Granulocytes: 0.02 10*3/uL (ref 0.00–0.07)
Basophils Absolute: 0 10*3/uL (ref 0.0–0.1)
Basophils Relative: 0 %
Eosinophils Absolute: 0 10*3/uL (ref 0.0–0.5)
Eosinophils Relative: 0 %
HCT: 48.5 % (ref 39.0–52.0)
Hemoglobin: 15.8 g/dL (ref 13.0–17.0)
Immature Granulocytes: 0 %
Lymphocytes Relative: 14 %
Lymphs Abs: 0.8 10*3/uL (ref 0.7–4.0)
MCH: 29 pg (ref 26.0–34.0)
MCHC: 32.6 g/dL (ref 30.0–36.0)
MCV: 89 fL (ref 80.0–100.0)
Monocytes Absolute: 0.6 10*3/uL (ref 0.1–1.0)
Monocytes Relative: 10 %
Neutro Abs: 4.4 10*3/uL (ref 1.7–7.7)
Neutrophils Relative %: 76 %
Platelets: 167 10*3/uL (ref 150–400)
RBC: 5.45 MIL/uL (ref 4.22–5.81)
RDW: 13 % (ref 11.5–15.5)
WBC: 5.8 10*3/uL (ref 4.0–10.5)
nRBC: 0 % (ref 0.0–0.2)

## 2018-09-04 MED ORDER — ALBUTEROL SULFATE HFA 108 (90 BASE) MCG/ACT IN AERS
2.0000 | INHALATION_SPRAY | Freq: Once | RESPIRATORY_TRACT | Status: AC
  Administered 2018-09-04: 2 via RESPIRATORY_TRACT
  Filled 2018-09-04: qty 6.7

## 2018-09-04 NOTE — Discharge Instructions (Addendum)
You can stop the doxycycline and the Tamiflu.  You can continue Levaquin.     Person Under Monitoring Name: Matthew Evans  Location: [address] [place] [address]   Infection Prevention Recommendations for Individuals Confirmed to have, or Being Evaluated for, 2019 Novel Coronavirus (COVID-19) Infection Who Receive Care at Home  Individuals who are confirmed to have, or are being evaluated for, COVID-19 should follow the prevention steps below until a healthcare provider or local or state health department says they can return to normal activities.  Stay home except to get medical care You should restrict activities outside your home, except for getting medical care. Do not go to work, school, or public areas, and do not use public transportation or taxis.  Call ahead before visiting your doctor Before your medical appointment, call the healthcare provider and tell them that you have, or are being evaluated for, COVID-19 infection. This will help the healthcare providers office take steps to keep other people from getting infected. Ask your healthcare provider to call the local or state health department.  Monitor your symptoms Seek prompt medical attention if your illness is worsening (e.g., difficulty breathing). Before going to your medical appointment, call the healthcare provider and tell them that you have, or are being evaluated for, COVID-19 infection. Ask your healthcare provider to call the local or state health department.  Wear a facemask You should wear a facemask that covers your nose and mouth when you are in the same room with other people and when you visit a healthcare provider. People who live with or visit you should also wear a facemask while they are in the same room with you.  Separate yourself from other people in your home As much as possible, you should stay in a different room from other people in your home. Also, you should use a  separate bathroom, if available.  Avoid sharing household items You should not share dishes, drinking glasses, cups, eating utensils, towels, bedding, or other items with other people in your home. After using these items, you should wash them thoroughly with soap and water.  Cover your coughs and sneezes Cover your mouth and nose with a tissue when you cough or sneeze, or you can cough or sneeze into your sleeve. Throw used tissues in a lined trash can, and immediately wash your hands with soap and water for at least 20 seconds or use an alcohol-based hand rub.  Wash your [employer] your hands often and thoroughly with soap and water for at least 20 seconds. You can use an alcohol-based hand sanitizer if soap and water are not available and if your hands are not visibly dirty. Avoid touching your eyes, nose, and mouth with unwashed hands.   Prevention Steps for Caregivers and Household Members of Individuals Confirmed to have, or Being Evaluated for, COVID-19 Infection Being Cared for in the Home  If you live with, or provide care at home for, a person confirmed to have, or being evaluated for, COVID-19 infection please follow these guidelines to prevent infection:  Follow healthcare providers instructions Make sure that you understand and can help the patient follow any healthcare provider instructions for all care.  Provide for the patients basic needs You should help the patient with basic needs in the home and provide support for getting groceries, prescriptions, and other personal needs.  Monitor the patients symptoms If they are getting sicker, call his or her medical provider and tell them that the  patient has, or is being evaluated for, COVID-19 infection. This will help the healthcare providers office take steps to keep other people from getting infected. Ask the healthcare provider to call the local or state health department.  Limit the number of people who have  contact with the patient If possible, have only one caregiver for the patient. Other household members should stay in another home or place of residence. If this is not possible, they should stay in another room, or be separated from the patient as much as possible. Use a separate bathroom, if available. Restrict visitors who do not have an essential need to be in the home.  Keep older adults, very young children, and other sick people away from the patient Keep older adults, very young children, and those who have compromised immune systems or chronic health conditions away from the patient. This includes people with chronic heart, lung, or kidney conditions, diabetes, and cancer.  Ensure good ventilation Make sure that shared spaces in the home have good air flow, such as from an air conditioner or an opened window, weather permitting.  Wash your hands often Wash your hands often and thoroughly with soap and water for at least 20 seconds. You can use an alcohol based hand sanitizer if soap and water are not available and if your hands are not visibly dirty. Avoid touching your eyes, nose, and mouth with unwashed hands. Use disposable paper towels to dry your hands. If not available, use dedicated cloth towels and replace them when they become wet.  Wear a facemask and gloves Wear a disposable facemask at all times in the room and gloves when you touch or have contact with the patients blood, body fluids, and/or secretions or excretions, such as sweat, saliva, sputum, nasal mucus, vomit, urine, or feces.  Ensure the mask fits over your nose and mouth tightly, and do not touch it during use. Throw out disposable facemasks and gloves after using them. Do not reuse. Wash your hands immediately after removing your facemask and gloves. If your personal clothing becomes contaminated, carefully remove clothing and launder. Wash your hands after handling contaminated clothing. Place all used  disposable facemasks, gloves, and other waste in a lined container before disposing them with other household waste. Remove gloves and wash your hands immediately after handling these items.  Do not share dishes, glasses, or other household items with the patient Avoid sharing household items. You should not share dishes, drinking glasses, cups, eating utensils, towels, bedding, or other items with a patient who is confirmed to have, or being evaluated for, COVID-19 infection. After the person uses these items, you should wash them thoroughly with soap and water.  Wash laundry thoroughly Immediately remove and wash clothes or bedding that have blood, body fluids, and/or secretions or excretions, such as sweat, saliva, sputum, nasal mucus, vomit, urine, or feces, on them. Wear gloves when handling laundry from the patient. Read and follow directions on labels of laundry or clothing items and detergent. In general, wash and dry with the warmest temperatures recommended on the label.  Clean all areas the individual has used often Clean all touchable surfaces, such as counters, tabletops, doorknobs, bathroom fixtures, toilets, phones, keyboards, tablets, and bedside tables, every day. Also, clean any surfaces that may have blood, body fluids, and/or secretions or excretions on them. Wear gloves when cleaning surfaces the patient has come in contact with. Use a diluted bleach solution (e.g., dilute bleach with 1 part bleach and 10 parts water)  or a household disinfectant with a label that says EPA-registered for coronaviruses. To make a bleach solution at home, add 1 tablespoon of bleach to 1 quart (4 cups) of water. For a larger supply, add  cup of bleach to 1 gallon (16 cups) of water. Read labels of cleaning products and follow recommendations provided on product labels. Labels contain instructions for safe and effective use of the cleaning product including precautions you should take when applying  the product, such as wearing gloves or eye protection and making sure you have good ventilation during use of the product. Remove gloves and wash hands immediately after cleaning.  Monitor yourself for signs and symptoms of illness Caregivers and household members are considered close contacts, should monitor their health, and will be asked to limit movement outside of the home to the extent possible. Follow the monitoring steps for close contacts listed on the symptom monitoring form.   ? If you have additional questions, contact your local health department or call the epidemiologist on call at [phone] (available 24/7). ? This guidance is subject to change. For the most up-to-date guidance from [facility], please refer to their website: [email or web address]

## 2018-09-04 NOTE — ED Provider Notes (Signed)
[facility]   CSN: [id number] Arrival date & time: 09/04/18  2144    History   Chief Complaint Chief Complaint  Patient presents with  . Fever    HPI Matthew Evans is a 68 y.o. male.     The history is provided by the patient.  Fever  Max temp prior to arrival:  101 Severity:  Moderate Onset quality:  Gradual Timing:  Intermittent Progression:  Worsening Chronicity:  New Relieved by:  Nothing Worsened by:  Nothing Associated symptoms: chills, diarrhea, myalgias and nausea   Associated symptoms: no chest pain, no cough, no dysuria, no headaches and no vomiting    Patient with history of CAD, emphysema presents with fever.  He reports for the past 3 days he has had a fever, last recorded temperature at home was 101.  He discussed this with his PCP this week, who placed him on cough syrup, doxycycline, Levaquin, Tamiflu. He reports his fever has persisted. Denies any significant cough.  No headache. He has nausea with dry heaving, and diarrhea.  No abdominal pain.  No chest pain. Denies  any rash. No one else is sick at home, denies any exposures to COVID-19 No tick bites He reports his flu test is negative.  He did have COVID-19 testing that is still pending Past Medical History:  Diagnosis Date  . AAA (abdominal aortic aneurysm) (HCC)    mild; doppler 08/29/12-3.4x3.7  . Arthritis    "hands, back" (09/28/2016)  . CAD (coronary artery disease)    echo 07/26/12-EF 55-60%; myoview 07/26/12-low normal wall motion with mild apical hypokinesis as well a septal wall motion consistent with LBBB  . Carotid artery occlusion   . Chronic lower back pain   . Cutaneous lupus erythematosus    [name], [name]  . Dementia (HCC)    "moderate; dx'd in ~ 2011" (09/28/2016)  . Dermatitis   . Emphysema   . GERD (gastroesophageal reflux disease)   . H/O hiatal hernia    . Headache(784.0)   . History of palpitations   . HTN (hypertension)   . Hyperlipidemia   . Hypothyroidism    HX OF RADIOACTIVE IODINE   . Memory loss    d/t mini stroke  . Mini stroke (HCC) ~ 2011  . Myocardial infarction (HCC) 09/2007   PCI to RCA  . Shortness of breath    WITH EXERTION  . Sleep apnea    sleep study 12/14/06 ahi 16.89, during rem 12.3, supine ahi 57.49; cpap 02/22/07-c-flex of 3 at 13cm h2o; PT STATES HE DOES NOT USE HIS CPAP EVERY NIGHT  . Sleep apnea    "aien't worn mask in 3 years" (09/28/2016)  . Stroke [facility](HCC)    PT STATES NEUROLOGIST TOLD HIM HE HAD HAD MINI STROKE  . Thyroid disease    hypothyroidism    Patient Active Problem List   Diagnosis Date Noted  . Solitary pulmonary nodule 10/04/2016  . Unstable angina (HCC)   . Abnormal nuclear stress test   . Elevated troponin   . Chest pain 09/28/2016  . Post-op pain 09/28/2016  . Preoperative clearance 08/29/2016  . Left bundle branch block 11/23/2014  . Status post Nissen fundoplication 10/09/2013  . CAD (coronary artery disease) 10/29/2012  . AAA (abdominal aortic aneurysm) (HCC) 10/29/2012  . Hypertensive heart disease 10/29/2012  . Hypothyroid 10/29/2012  . Hyperlipidemia with target LDL  less than 70 10/29/2012  . COPD (chronic obstructive pulmonary disease) (HCC) 10/29/2012  . 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  . Dyspnea 09/09/2012    Past Surgical History:  Procedure Laterality Date  . balloon atherotomy     LAD  . BICEPS TENDON REPAIR Right 09/25/2016   "cleaned out spurs and arthritis" (09/28/2016)  . CARDIAC CATHETERIZATION  08/11/11   EF50-55%, small AAA, no signifiant stent restenosis in RCA , no restenosis of PTCA of LAD, medical therapy  . CARDIAC CATHETERIZATION  09/27/06   cutting balloon arthrotomy of LAD stenosis with dilatation of a 3.5x56mm cutting balloon  . CARDIAC CATHETERIZATION  01/07/09   50-60% progressive dz beyond the stented segment in the RCA , LAD intervention remained  patent  . CORONARY ANGIOPLASTY WITH STENT PLACEMENT  09/24/06   PTCA/stent of RCA with a 3.5x82mm Taxus DES post dilated to 3.42mm  . CORONARY ANGIOPLASTY WITH STENT PLACEMENT Right 09/2007  . ESOPHAGEAL MANOMETRY N/A 08/04/2013   Procedure: ESOPHAGEAL MANOMETRY (EM);  Surgeon: Theda Belfast, MD;  Location: [facility] ENDOSCOPY;  Service: Endoscopy;  Laterality: N/A;  . HERNIA REPAIR    . HIATAL HERNIA REPAIR  2011?  Marland Kitchen LEFT HEART CATH AND CORONARY ANGIOGRAPHY N/A 10/02/2016   Procedure: Left Heart Cath and Coronary Angiography;  Surgeon: Marykay Lex, MD;  Location: [facility] INVASIVE CV LAB;  Service: Cardiovascular;  Laterality: N/A;  . LEFT HEART CATHETERIZATION WITH CORONARY ANGIOGRAM N/A 08/11/2011   Procedure: LEFT HEART CATHETERIZATION WITH CORONARY ANGIOGRAM;  Surgeon: Lennette Bihari, MD;  Location: [facility] CATH LAB;  Service: Cardiovascular;  Laterality: N/A;  . SHOULDER SURGERY Right         Home Medications    Prior to Admission medications   Medication Sig Start Date End Date Taking? Authorizing Provider  Albuterol Sulfate (PROAIR RESPICLICK) 108 (90 Base) MCG/ACT AEPB Inhale 2 puffs into the lungs every 6 (six) hours as needed. 05/23/18   Oretha Milch, MD  amLODipine (NORVASC) 5 MG tablet Take 1 tablet (5 mg total) by mouth daily. 02/20/18   Lennette Bihari, MD  aspirin 81 MG EC tablet TAKE 1 TABLET BY MOUTH EVERY DAY 12/03/17   Lennette Bihari, MD  clopidogrel (PLAVIX) 75 MG tablet TAKE 1 TABLET (75 MG TOTAL) BY MOUTH DAILY WITH BREAKFAST. 02/20/18   Lennette Bihari, MD  cyclobenzaprine (FLEXERIL) 10 MG tablet Take 10 mg by mouth 2 (two) times daily as needed for muscle spasms.  08/31/14   [provider]  Fluticasone-Umeclidin-Vilant (TRELEGY ELLIPTA) 100-62.5-25 MCG/INH AEPB Inhale 1 puff into the lungs daily. 06/20/18   Oretha Milch, MD  hydrochlorothiazide (MICROZIDE) 12.5 MG capsule Take 1 capsule (12.5 mg total) by mouth every other day. 02/20/18   Lennette Bihari, MD  isosorbide  mononitrate (IMDUR) 60 MG 24 hr tablet Take 1.5 tablets (90 mg total) by mouth daily. 02/20/18   Lennette Bihari, MD  levothyroxine (SYNTHROID) 100 MCG tablet Take 1 tablet (100 mcg total) by mouth daily before breakfast. 10/19/16   Lennette Bihari, MD  losartan (COZAAR) 50 MG tablet Take 1.5 tablets (75 mg total) by mouth daily. 02/20/18 02/15/19  Lennette Bihari, MD  metoprolol succinate (TOPROL-XL) 25 MG 24 hr tablet Take 1 tablet (25 mg total) by mouth daily. 02/20/18   Lennette Bihari, MD  Omega-3 Fatty Acids (FISH OIL) 1200 MG CAPS Take 1,200 mg by mouth 2 (two) times daily.     [provider]  umeclidinium-vilanterol [facility] ELLIPTA)  62.5-25 MCG/INH AEPB INHALE 1 PUFF INTO THE LUNGS DAILY.  Needs office visit for refills. 05/09/18   Oretha Milch, MD    Family History Family History  Problem Relation Age of Onset  . Emphysema Mother   . Heart disease Father   . Mesothelioma Father   . Heart attack Father     Social History Social History   Tobacco Use  . Smoking status: Former Smoker    Packs/day: 2.00    Years: 45.00    Pack years: 90.00    Types: Cigarettes    Last attempt to quit: 02/04/2010    Years since quitting: 8.5  . Smokeless tobacco: Never Used  Substance Use Topics  . Alcohol use: No    Comment: QUIT 1994  . Drug use: No     Allergies   Niaspan [niacin]; Septra [sulfamethoxazole-trimethoprim]; Simcor [niacin-simvastatin er]; and Zocor [simvastatin]   Review of Systems Review of Systems  Constitutional: Positive for chills and fever.  Respiratory: Negative for cough.   Cardiovascular: Negative for chest pain.  Gastrointestinal: Positive for diarrhea and nausea. Negative for vomiting.  Genitourinary: Negative for dysuria.  Musculoskeletal: Positive for myalgias.  Neurological: Negative for headaches.  All other systems reviewed and are negative.    Physical Exam Updated Vital Signs BP (!) 150/82   Pulse (!) 101   Temp 98.1 F (36.7 C)  (Oral)   Ht 1.727 m (5\' 8" )   Wt 96.2 kg   SpO2 96%   BMI 32.23 kg/m   Physical Exam CONSTITUTIONAL: Well developed/well nourished HEAD: Normocephalic/atraumatic EYES: EOMI/PERRL ENMT: Mucous membranes moist NECK: supple no meningeal signs SPINE/BACK:entire spine nontender CV: S1/S2 noted, no murmurs/rubs/gallops noted LUNGS: Lungs are clear to auscultation bilaterally, no apparent distress ABDOMEN: soft, nontender, no rebound or guarding, bowel sounds noted throughout abdomen GU:no cva tenderness NEURO: Pt is awake/alert/appropriate, moves all extremitiesx4.  No facial droop.   EXTREMITIES: pulses normal/equal, full ROM SKIN: warm, color normal PSYCH: no abnormalities of mood noted, alert and oriented to situation   ED Treatments / Results  Labs (all labs ordered are listed, but only abnormal results are displayed) Labs Reviewed  BASIC METABOLIC PANEL  CBC WITH DIFFERENTIAL/PLATELET  URINALYSIS, ROUTINE W REFLEX MICROSCOPIC    EKG EKG Interpretation  Date/Time:  Wednesday September 04 2018 22:54:04 EDT Ventricular Rate:  94 PR Interval:    QRS Duration: 139 QT Interval:  373 QTC Calculation: 467 R Axis:   -139 Text Interpretation:  Sinus rhythm Nonspecific intraventricular conduction delay Confirmed by Zadie Rhine ([id number]) on 09/04/2018 11:13:26 PM   Radiology Dg Chest Port 1 View  Result Date: 09/04/2018 CLINICAL DATA:  68 year old male with fever. EXAM: PORTABLE CHEST 1 VIEW COMPARISON:  Chest CT dated 05/30/2018 FINDINGS: There is a mild background of emphysema. No focal consolidation, pleural effusion, or pneumothorax. Top-normal cardiac silhouette. Old right lateral rib fracture deformities. No acute osseous pathology. IMPRESSION: No active disease. Electronically Signed   By: Elgie Collard M.D.   On: 09/04/2018 22:49    Procedures Procedures (including critical care time)  Medications Ordered in ED Medications  albuterol (PROVENTIL HFA;VENTOLIN HFA) 108  (90 Base) MCG/ACT inhaler 2 puff (2 puffs Inhalation Given 09/04/18 2258)     Initial Impression / Assessment and Plan / ED Course  I have reviewed the triage vital signs and the nursing notes.  Pertinent labs & imaging results that were available during my care of the patient were reviewed by me and considered in my medical decision  making (see chart for details).        Patient with history of emphysema CAD presents with fever for the past 3 days. Denies any recent cough, reports nausea and diarrhea. He denies chest pain/headache/abdominal pain.  He is in no acute distress, no hypoxia.  He did report shortness of breath.  Lung sounds clear Albuterol inhaler given. He is clinically well-appearing.  11:18 PM Plan at signout to dr Blinda Leatherwood F/u on labs and reassess vitals If stable pt can be discharged  Final Clinical Impressions(s) / ED Diagnoses   Final diagnoses:  Fever    ED Discharge Orders    None       Zadie Rhine, MD 09/04/18 2318

## 2018-09-04 NOTE — ED Notes (Signed)
Wife Larita Fife [phone]

## 2018-09-04 NOTE — ED Triage Notes (Signed)
Pt from home reports he was seen in his PCP's office earlier today. Per pt's wife, Fever was 101/103 with 102 Pulse. Antibiotics were given a few days ago with no improvements in sx. Pt went to PCP office and was given IM antibiotic and something for nausea. Pt c/o SHOB and was instructed to come to ED if he experienced any SHOB. Pt is now afebrile with a T of 97.7 oral. Pt denies Dizziness, lightheadedness, and cough. Pt reports one episode of CP yesterday, but denies any CP today. Pt has Hx of MI, Copd, Aortic Aneurysm

## 2018-09-05 LAB — URINALYSIS, ROUTINE W REFLEX MICROSCOPIC
Bacteria, UA: NONE SEEN
Bilirubin Urine: NEGATIVE
Glucose, UA: NEGATIVE mg/dL
Ketones, ur: NEGATIVE mg/dL
Leukocytes,Ua: NEGATIVE
Nitrite: NEGATIVE
Protein, ur: 100 mg/dL — AB
Specific Gravity, Urine: 1.032 — ABNORMAL HIGH (ref 1.005–1.030)
pH: 5 (ref 5.0–8.0)

## 2018-09-05 NOTE — ED Provider Notes (Signed)
Addendum - Late entry   Matthew Evans was evaluated in Emergency Department on 09/04/2018 for the symptoms described in the history of present illness. He was evaluated in the context of the global COVID-19 pandemic, which necessitated consideration that the patient might be at risk for infection with the SARS-CoV-2 virus that causes COVID-19. Institutional protocols and algorithms that pertain to the evaluation of patients at risk for COVID-19 are in a state of rapid change based on information released by regulatory bodies including the CDC and federal and state organizations. These policies and algorithms were followed during the patient's care in the ED.    Zadie Rhine, MD 09/05/18 1620

## 2018-09-05 NOTE — ED Provider Notes (Signed)
Patient signed out to me to follow-up on labs.  Patient seen for febrile illness.  He is currently under significant treatment including doxycycline, Levaquin, Tamiflu.  He had outpatient influenza testing today which was negative.  He also has pending COVID-19 testing.  His lab work is reassuring, urinalysis is normal, no evidence of other source of fever.  Chest x-ray did not show any pneumonia.  Will stop Tamiflu and doxycycline, continue Levaquin.  Follow-up COVID-19 testing.  Patient appears well, vital signs unremarkable, not requiring patient not requiring supplemental oxygen at this time.  Patient will be discharged with return precautions.   Gilda Crease, MD 09/05/18 [phone]

## 2018-09-06 ENCOUNTER — Emergency Department (HOSPITAL_COMMUNITY): Payer: BLUE CROSS/BLUE SHIELD

## 2018-09-06 ENCOUNTER — Emergency Department (HOSPITAL_COMMUNITY)
Admission: EM | Admit: 2018-09-06 | Discharge: 2018-09-06 | Disposition: A | Discharge status: Home / Self Care | Payer: BLUE CROSS/BLUE SHIELD | Attending: Emergency Medicine | Admitting: Emergency Medicine

## 2018-09-06 ENCOUNTER — Encounter (HOSPITAL_COMMUNITY): Payer: Self-pay

## 2018-09-06 ENCOUNTER — Other Ambulatory Visit: Payer: Self-pay

## 2018-09-06 DIAGNOSIS — R110 Nausea: Secondary | ICD-10-CM | POA: Diagnosis not present

## 2018-09-06 DIAGNOSIS — Z79899 Other long term (current) drug therapy: Secondary | ICD-10-CM | POA: Insufficient documentation

## 2018-09-06 DIAGNOSIS — E039 Hypothyroidism, unspecified: Secondary | ICD-10-CM | POA: Insufficient documentation

## 2018-09-06 DIAGNOSIS — Z8619 Personal history of other infectious and parasitic diseases: Secondary | ICD-10-CM

## 2018-09-06 DIAGNOSIS — I2510 Atherosclerotic heart disease of native coronary artery without angina pectoris: Secondary | ICD-10-CM | POA: Insufficient documentation

## 2018-09-06 DIAGNOSIS — E860 Dehydration: Secondary | ICD-10-CM | POA: Diagnosis not present

## 2018-09-06 DIAGNOSIS — Z209 Contact with and (suspected) exposure to unspecified communicable disease: Secondary | ICD-10-CM | POA: Diagnosis not present

## 2018-09-06 DIAGNOSIS — F0390 Unspecified dementia without behavioral disturbance: Secondary | ICD-10-CM | POA: Diagnosis not present

## 2018-09-06 DIAGNOSIS — Z87891 Personal history of nicotine dependence: Secondary | ICD-10-CM | POA: Insufficient documentation

## 2018-09-06 DIAGNOSIS — Z7982 Long term (current) use of aspirin: Secondary | ICD-10-CM | POA: Diagnosis not present

## 2018-09-06 DIAGNOSIS — Z7901 Long term (current) use of anticoagulants: Secondary | ICD-10-CM | POA: Diagnosis not present

## 2018-09-06 DIAGNOSIS — J449 Chronic obstructive pulmonary disease, unspecified: Secondary | ICD-10-CM | POA: Diagnosis not present

## 2018-09-06 DIAGNOSIS — R0689 Other abnormalities of breathing: Secondary | ICD-10-CM | POA: Diagnosis not present

## 2018-09-06 DIAGNOSIS — Z8616 Personal history of COVID-19: Secondary | ICD-10-CM

## 2018-09-06 DIAGNOSIS — I10 Essential (primary) hypertension: Secondary | ICD-10-CM | POA: Insufficient documentation

## 2018-09-06 DIAGNOSIS — I959 Hypotension, unspecified: Secondary | ICD-10-CM | POA: Diagnosis not present

## 2018-09-06 DIAGNOSIS — R069 Unspecified abnormalities of breathing: Secondary | ICD-10-CM | POA: Diagnosis not present

## 2018-09-06 LAB — COMPREHENSIVE METABOLIC PANEL
ALT: 44 U/L (ref 0–44)
AST: 70 U/L — ABNORMAL HIGH (ref 15–41)
Albumin: 3.4 g/dL — ABNORMAL LOW (ref 3.5–5.0)
Alkaline Phosphatase: 81 U/L (ref 38–126)
Anion gap: 11 (ref 5–15)
BUN: 33 mg/dL — ABNORMAL HIGH (ref 8–23)
CO2: 26 mmol/L (ref 22–32)
Calcium: 8.1 mg/dL — ABNORMAL LOW (ref 8.9–10.3)
Chloride: 98 mmol/L (ref 98–111)
Creatinine, Ser: 1.42 mg/dL — ABNORMAL HIGH (ref 0.61–1.24)
GFR calc Af Amer: 58 mL/min — ABNORMAL LOW (ref >60)
GFR calc non Af Amer: 50 mL/min — ABNORMAL LOW (ref >60)
Glucose, Bld: 110 mg/dL — ABNORMAL HIGH (ref 70–99)
Potassium: 3.8 mmol/L (ref 3.5–5.1)
Sodium: 135 mmol/L (ref 135–145)
Total Bilirubin: 0.8 mg/dL (ref 0.3–1.2)
Total Protein: 7.4 g/dL (ref 6.5–8.1)

## 2018-09-06 LAB — CBC WITH DIFFERENTIAL/PLATELET
Abs Immature Granulocytes: 0.03 10*3/uL (ref 0.00–0.07)
Basophils Absolute: 0 10*3/uL (ref 0.0–0.1)
Basophils Relative: 0 %
Eosinophils Absolute: 0 10*3/uL (ref 0.0–0.5)
Eosinophils Relative: 0 %
HCT: 48.6 % (ref 39.0–52.0)
Hemoglobin: 15.7 g/dL (ref 13.0–17.0)
Immature Granulocytes: 1 %
Lymphocytes Relative: 5 %
Lymphs Abs: 0.3 10*3/uL — ABNORMAL LOW (ref 0.7–4.0)
MCH: 28.8 pg (ref 26.0–34.0)
MCHC: 32.3 g/dL (ref 30.0–36.0)
MCV: 89.2 fL (ref 80.0–100.0)
Monocytes Absolute: 0.4 10*3/uL (ref 0.1–1.0)
Monocytes Relative: 6 %
Neutro Abs: 5.6 10*3/uL (ref 1.7–7.7)
Neutrophils Relative %: 88 %
Platelets: 171 10*3/uL (ref 150–400)
RBC: 5.45 MIL/uL (ref 4.22–5.81)
RDW: 13 % (ref 11.5–15.5)
WBC: 6.3 10*3/uL (ref 4.0–10.5)
nRBC: 0 % (ref 0.0–0.2)

## 2018-09-06 LAB — URINALYSIS, ROUTINE W REFLEX MICROSCOPIC
Bacteria, UA: NONE SEEN
Bilirubin Urine: NEGATIVE
Glucose, UA: NEGATIVE mg/dL
Ketones, ur: 20 mg/dL — AB
Leukocytes,Ua: NEGATIVE
Nitrite: NEGATIVE
Protein, ur: >=300 mg/dL — AB
Specific Gravity, Urine: 1.031 — ABNORMAL HIGH (ref 1.005–1.030)
pH: 5 (ref 5.0–8.0)

## 2018-09-06 MED ORDER — ONDANSETRON HCL 4 MG/2ML IJ SOLN
4.0000 mg | Freq: Once | INTRAMUSCULAR | Status: AC
  Administered 2018-09-06: 4 mg via INTRAVENOUS
  Filled 2018-09-06: qty 2

## 2018-09-06 MED ORDER — ACETAMINOPHEN 500 MG PO TABS
1000.0000 mg | ORAL_TABLET | Freq: Once | ORAL | Status: AC
  Administered 2018-09-06: 1000 mg via ORAL
  Filled 2018-09-06: qty 2

## 2018-09-06 MED ORDER — SODIUM CHLORIDE 0.9 % IV BOLUS
1000.0000 mL | Freq: Once | INTRAVENOUS | Status: AC
  Administered 2018-09-06: 1000 mL via INTRAVENOUS

## 2018-09-06 MED ORDER — ONDANSETRON 4 MG PO TBDP: 4.0000 mg | ORAL_TABLET | Freq: Three times a day (TID) | ORAL | 0 refills | Status: DC | PRN

## 2018-09-06 NOTE — ED Notes (Signed)
Bed: PX10 Expected date:  Expected time:  Means of arrival:  Comments: COVID

## 2018-09-06 NOTE — ED Triage Notes (Signed)
Pt BIB [facility] from home. Pt is confirmed COVID pt. Pt reports dry heaving and weak since Monday. Pt denies vomiting.   94% RA hx of COPD 128/83 HR 101

## 2018-09-06 NOTE — ED Notes (Signed)
Bed: WTR8 Expected date:  Expected time:  Means of arrival:  Comments: EMS COVID POSITIVE PT dry heaves

## 2018-09-06 NOTE — Discharge Instructions (Addendum)
Person Under Monitoring Name: Matthew Evans  Location: [address] [place] [address]   Infection Prevention Recommendations for Individuals Confirmed to have, or Being Evaluated for, 2019 Novel Coronavirus (COVID-19) Infection Who Receive Care at Home  Individuals who are confirmed to have, or are being evaluated for, COVID-19 should follow the prevention steps below until a healthcare provider or local or state health department says they can return to normal activities.  Stay home except to get medical care You should restrict activities outside your home, except for getting medical care. Do not go to work, school, or public areas, and do not use public transportation or taxis.  Call ahead before visiting your doctor Before your medical appointment, call the healthcare provider and tell them that you have, or are being evaluated for, COVID-19 infection. This will help the healthcare providers office take steps to keep other people from getting infected. Ask your healthcare provider to call the local or state health department.  Monitor your symptoms Seek prompt medical attention if your illness is worsening (e.g., difficulty breathing). Before going to your medical appointment, call the healthcare provider and tell them that you have, or are being evaluated for, COVID-19 infection. Ask your healthcare provider to call the local or state health department.  Wear a facemask You should wear a facemask that covers your nose and mouth when you are in the same room with other people and when you visit a healthcare provider. People who live with or visit you should also wear a facemask while they are in the same room with you.  Separate yourself from other people in your home As much as possible, you should stay in a different room from other people in your home. Also, you should use a separate bathroom, if available.  Avoid sharing household items You should not  share dishes, drinking glasses, cups, eating utensils, towels, bedding, or other items with other people in your home. After using these items, you should wash them thoroughly with soap and water.  Cover your coughs and sneezes Cover your mouth and nose with a tissue when you cough or sneeze, or you can cough or sneeze into your sleeve. Throw used tissues in a lined trash can, and immediately wash your hands with soap and water for at least 20 seconds or use an alcohol-based hand rub.  Wash your [employer] your hands often and thoroughly with soap and water for at least 20 seconds. You can use an alcohol-based hand sanitizer if soap and water are not available and if your hands are not visibly dirty. Avoid touching your eyes, nose, and mouth with unwashed hands.   Prevention Steps for Caregivers and Household Members of Individuals Confirmed to have, or Being Evaluated for, COVID-19 Infection Being Cared for in the Home  If you live with, or provide care at home for, a person confirmed to have, or being evaluated for, COVID-19 infection please follow these guidelines to prevent infection:  Follow healthcare providers instructions Make sure that you understand and can help the patient follow any healthcare provider instructions for all care.  Provide for the patients basic needs You should help the patient with basic needs in the home and provide support for getting groceries, prescriptions, and other personal needs.  Monitor the patients symptoms If they are getting sicker, call his or her medical provider and tell them that the patient has, or is being evaluated for, COVID-19 infection. This will help the healthcare providers  office take steps to keep other people from getting infected. Ask the healthcare provider to call the local or state health department.  Limit the number of people who have contact with the patient If possible, have only one caregiver for the  patient. Other household members should stay in another home or place of residence. If this is not possible, they should stay in another room, or be separated from the patient as much as possible. Use a separate bathroom, if available. Restrict visitors who do not have an essential need to be in the home.  Keep older adults, very young children, and other sick people away from the patient Keep older adults, very young children, and those who have compromised immune systems or chronic health conditions away from the patient. This includes people with chronic heart, lung, or kidney conditions, diabetes, and cancer.  Ensure good ventilation Make sure that shared spaces in the home have good air flow, such as from an air conditioner or an opened window, weather permitting.  Wash your hands often Wash your hands often and thoroughly with soap and water for at least 20 seconds. You can use an alcohol based hand sanitizer if soap and water are not available and if your hands are not visibly dirty. Avoid touching your eyes, nose, and mouth with unwashed hands. Use disposable paper towels to dry your hands. If not available, use dedicated cloth towels and replace them when they become wet.  Wear a facemask and gloves Wear a disposable facemask at all times in the room and gloves when you touch or have contact with the patients blood, body fluids, and/or secretions or excretions, such as sweat, saliva, sputum, nasal mucus, vomit, urine, or feces.  Ensure the mask fits over your nose and mouth tightly, and do not touch it during use. Throw out disposable facemasks and gloves after using them. Do not reuse. Wash your hands immediately after removing your facemask and gloves. If your personal clothing becomes contaminated, carefully remove clothing and launder. Wash your hands after handling contaminated clothing. Place all used disposable facemasks, gloves, and other waste in a lined container before  disposing them with other household waste. Remove gloves and wash your hands immediately after handling these items.  Do not share dishes, glasses, or other household items with the patient Avoid sharing household items. You should not share dishes, drinking glasses, cups, eating utensils, towels, bedding, or other items with a patient who is confirmed to have, or being evaluated for, COVID-19 infection. After the person uses these items, you should wash them thoroughly with soap and water.  Wash laundry thoroughly Immediately remove and wash clothes or bedding that have blood, body fluids, and/or secretions or excretions, such as sweat, saliva, sputum, nasal mucus, vomit, urine, or feces, on them. Wear gloves when handling laundry from the patient. Read and follow directions on labels of laundry or clothing items and detergent. In general, wash and dry with the warmest temperatures recommended on the label.  Clean all areas the individual has used often Clean all touchable surfaces, such as counters, tabletops, doorknobs, bathroom fixtures, toilets, phones, keyboards, tablets, and bedside tables, every day. Also, clean any surfaces that may have blood, body fluids, and/or secretions or excretions on them. Wear gloves when cleaning surfaces the patient has come in contact with. Use a diluted bleach solution (e.g., dilute bleach with 1 part bleach and 10 parts water) or a household disinfectant with a label that says EPA-registered for coronaviruses. To make a  bleach solution at home, add 1 tablespoon of bleach to 1 quart (4 cups) of water. For a larger supply, add  cup of bleach to 1 gallon (16 cups) of water. Read labels of cleaning products and follow recommendations provided on product labels. Labels contain instructions for safe and effective use of the cleaning product including precautions you should take when applying the product, such as wearing gloves or eye protection and making sure you  have good ventilation during use of the product. Remove gloves and wash hands immediately after cleaning.  Monitor yourself for signs and symptoms of illness Caregivers and household members are considered close contacts, should monitor their health, and will be asked to limit movement outside of the home to the extent possible. Follow the monitoring steps for close contacts listed on the symptom monitoring form.   ? If you have additional questions, contact your local health department or call the epidemiologist on call at [phone] (available 24/7). ? This guidance is subject to change. For the most up-to-date guidance from [facility], please refer to their website: [email or web address].pl

## 2018-09-06 NOTE — ED Provider Notes (Addendum)
[facility]-EMERGENCY DEPT Provider Note   CSN: [id number] Arrival date & time: 09/06/18  1417    History   Chief Complaint Chief Complaint  Patient presents with   Nausea   Weakness   Shortness of Breath    HPI Matthew Evans is a 68 y.o. male.     Pt presents to the ED with n/v.  He said his pcp ([facility]) tested him for Covid-19 and it came back positive today.  The pt said he's been feeling weak since then.  He has been feeling nauseous and has lost weight.  The pt was seen in the ED on 4/1 for fever and had labs and Xrays which were nl.  His pcp has had him on cough syrup, doxy, levaquin, and tamiflu.     Past Medical History:  Diagnosis Date   AAA (abdominal aortic aneurysm) (HCC)    mild; doppler 08/29/12-3.4x3.7   Arthritis    "hands, back" (09/28/2016)   CAD (coronary artery disease)    echo 07/26/12-EF 55-60%; myoview 07/26/12-low normal wall motion with mild apical hypokinesis as well a septal wall motion consistent with LBBB   Carotid artery occlusion    Chronic lower back pain    Cutaneous lupus erythematosus    [name], [name]   Dementia (HCC)    "moderate; dx'd in ~ 2011" (09/28/2016)   Dermatitis    Emphysema    GERD (gastroesophageal reflux disease)    H/O hiatal hernia    Headache(784.0)    History of palpitations    HTN (hypertension)    Hyperlipidemia    Hypothyroidism    HX OF RADIOACTIVE IODINE    Memory loss    d/t mini stroke   Mini stroke (HCC) ~ 2011   Myocardial infarction (HCC) 09/2007   PCI to RCA   Shortness of breath    WITH EXERTION   Sleep apnea    sleep study 12/14/06 ahi 16.89, during rem 12.3, supine ahi 57.49; cpap 02/22/07-c-flex of 3 at 13cm h2o; PT STATES HE DOES NOT USE HIS CPAP EVERY NIGHT   Sleep apnea    "aien't worn mask in 3 years" (09/28/2016)   Stroke (HCC)    PT STATES NEUROLOGIST TOLD HIM HE HAD HAD MINI  STROKE   Thyroid disease    hypothyroidism    Patient Active Problem List   Diagnosis Date Noted   Solitary pulmonary nodule 10/04/2016   Unstable angina (HCC)    Abnormal nuclear stress test    Elevated troponin    Chest pain 09/28/2016   Post-op pain 09/28/2016   Preoperative clearance 08/29/2016   Left bundle branch block 11/23/2014   Status post Nissen fundoplication 10/09/2013   CAD (coronary artery disease) 10/29/2012   AAA (abdominal aortic aneurysm) (HCC) 10/29/2012   Hypertensive heart disease 10/29/2012   Hypothyroid 10/29/2012   Hyperlipidemia with target LDL less than 70 10/29/2012   COPD (chronic obstructive pulmonary disease) (HCC) 10/29/2012   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   Dyspnea 09/09/2012    Past Surgical History:  Procedure Laterality Date   balloon atherotomy     LAD   BICEPS TENDON REPAIR Right 09/25/2016   "cleaned out spurs and arthritis" (09/28/2016)   CARDIAC CATHETERIZATION  08/11/11   EF50-55%, small AAA, no signifiant stent restenosis in RCA , no restenosis of PTCA of LAD, medical therapy   CARDIAC CATHETERIZATION  09/27/06   cutting  balloon arthrotomy of LAD stenosis with dilatation of a 3.5x43mm cutting balloon   CARDIAC CATHETERIZATION  01/07/09   50-60% progressive dz beyond the stented segment in the RCA , LAD intervention remained patent   CORONARY ANGIOPLASTY WITH STENT PLACEMENT  09/24/06   PTCA/stent of RCA with a 3.5x70mm Taxus DES post dilated to 3.12mm   CORONARY ANGIOPLASTY WITH STENT PLACEMENT Right 09/2007   ESOPHAGEAL MANOMETRY N/A 08/04/2013   Procedure: ESOPHAGEAL MANOMETRY (EM);  Surgeon: Theda Belfast, MD;  Location: [facility];  Service: Endoscopy;  Laterality: N/A;   HERNIA REPAIR     HIATAL HERNIA REPAIR  2011?   LEFT HEART CATH AND CORONARY ANGIOGRAPHY N/A 10/02/2016   Procedure: Left Heart Cath and Coronary Angiography;  Surgeon: Marykay Lex, MD;  Location: [facility] INVASIVE CV LAB;  Service:  Cardiovascular;  Laterality: N/A;   LEFT HEART CATHETERIZATION WITH CORONARY ANGIOGRAM N/A 08/11/2011   Procedure: LEFT HEART CATHETERIZATION WITH CORONARY ANGIOGRAM;  Surgeon: Lennette Bihari, MD;  Location: [facility] CATH LAB;  Service: Cardiovascular;  Laterality: N/A;   SHOULDER SURGERY Right         Home Medications    Prior to Admission medications   Medication Sig Start Date End Date Taking? Authorizing Provider  Albuterol Sulfate (PROAIR RESPICLICK) 108 (90 Base) MCG/ACT AEPB Inhale 2 puffs into the lungs every 6 (six) hours as needed. Patient taking differently: Inhale 2 puffs into the lungs every 6 (six) hours as needed (sob and wheezing).  05/23/18  Yes Oretha Milch, MD  amLODipine (NORVASC) 5 MG tablet Take 1 tablet (5 mg total) by mouth daily. 02/20/18  Yes Lennette Bihari, MD  aspirin 81 MG EC tablet TAKE 1 TABLET BY MOUTH EVERY DAY 12/03/17  Yes Lennette Bihari, MD  clopidogrel (PLAVIX) 75 MG tablet TAKE 1 TABLET (75 MG TOTAL) BY MOUTH DAILY WITH BREAKFAST. 02/20/18  Yes Lennette Bihari, MD  cyclobenzaprine (FLEXERIL) 10 MG tablet Take 10 mg by mouth 2 (two) times daily as needed for muscle spasms.  08/31/14  Yes [provider]  Fluticasone-Umeclidin-Vilant (TRELEGY ELLIPTA) 100-62.5-25 MCG/INH AEPB Inhale 1 puff into the lungs daily. 06/20/18  Yes Oretha Milch, MD  guaiFENesin-codeine 100-10 MG/5ML syrup Take 5 mLs by mouth every 6 (six) hours as needed for cough.  09/02/18  Yes [provider]  HYDROcodone-acetaminophen (NORCO) 7.5-325 MG tablet Take 1 tablet by mouth every 6 (six) hours as needed for moderate pain or severe pain.  08/31/18  Yes [provider]  isosorbide mononitrate (IMDUR) 60 MG 24 hr tablet Take 1.5 tablets (90 mg total) by mouth daily. 02/20/18  Yes Lennette Bihari, MD  levofloxacin (LEVAQUIN) 500 MG tablet Take 500 mg by mouth daily.   Yes [provider]  losartan (COZAAR) 25 MG tablet Take 25 mg by mouth every evening.  06/16/18   Yes [provider]  metoprolol succinate (TOPROL-XL) 25 MG 24 hr tablet Take 1 tablet (25 mg total) by mouth daily. 02/20/18  Yes Lennette Bihari, MD  Omega-3 Fatty Acids (FISH OIL) 1200 MG CAPS Take 1,200 mg by mouth 2 (two) times daily.    Yes [provider]  ondansetron (ZOFRAN) 4 MG tablet Take 4 mg by mouth every 8 (eight) hours as needed for nausea or vomiting.   Yes [provider]  SYNTHROID 175 MCG tablet Take 175 mcg by mouth daily before breakfast.  06/26/18  Yes [provider]  hydrochlorothiazide (MICROZIDE) 12.5 MG capsule Take  1 capsule (12.5 mg total) by mouth every other day. 02/20/18   Lennette Bihari, MD  levothyroxine (SYNTHROID) 100 MCG tablet Take 1 tablet (100 mcg total) by mouth daily before breakfast. Patient not taking: Reported on 09/06/2018 10/19/16   Lennette Bihari, MD  losartan (COZAAR) 50 MG tablet Take 1.5 tablets (75 mg total) by mouth daily. Patient not taking: Reported on 09/06/2018 02/20/18 02/15/19  Lennette Bihari, MD  methocarbamol (ROBAXIN) 500 MG tablet Take 500 mg by mouth every 6 (six) hours as needed for muscle spasms.  05/04/18   [provider]  ondansetron (ZOFRAN ODT) 4 MG disintegrating tablet Take 1 tablet (4 mg total) by mouth every 8 (eight) hours as needed. 09/06/18   Jacalyn Lefevre, MD  umeclidinium-vilanterol (ANORO ELLIPTA) 62.5-25 MCG/INH AEPB INHALE 1 PUFF INTO THE LUNGS DAILY.  Needs office visit for refills. Patient not taking: Reported on 09/06/2018 05/09/18   Oretha Milch, MD    Family History Family History  Problem Relation Age of Onset   Emphysema Mother    Heart disease Father    Mesothelioma Father    Heart attack Father     Social History Social History   Tobacco Use   Smoking status: Former Smoker    Packs/day: 2.00    Years: 45.00    Pack years: 90.00    Types: Cigarettes    Last attempt to quit: 02/04/2010    Years since quitting: 8.5   Smokeless tobacco: Never Used    Substance Use Topics   Alcohol use: No    Comment: QUIT 1994   Drug use: No     Allergies   Niaspan [niacin]; Septra [sulfamethoxazole-trimethoprim]; Simcor [niacin-simvastatin er]; and Zocor [simvastatin]   Review of Systems Review of Systems  Gastrointestinal: Positive for nausea.  All other systems reviewed and are negative.    Physical Exam Updated Vital Signs BP 138/72    Pulse 96    Temp (!) 102.3 F (39.1 C) (Oral)    Resp (!) 24    Ht  (1.727 m)    Wt 96 kg    SpO2 94%    BMI 32.18 kg/m   Physical Exam Vitals signs and nursing note reviewed.  Constitutional:      Appearance: He is well-developed.  HENT:     Head: Normocephalic and atraumatic.     Mouth/Throat:     Mouth: Mucous membranes are moist.     Pharynx: Oropharynx is clear.  Eyes:     Extraocular Movements: Extraocular movements intact.     Pupils: Pupils are equal, round, and reactive to light.  Neck:     Musculoskeletal: Normal range of motion and neck supple.  Cardiovascular:     Rate and Rhythm: Regular rhythm. Tachycardia present.  Pulmonary:     Effort: Pulmonary effort is normal.     Breath sounds: Normal breath sounds.  Abdominal:     General: Bowel sounds are normal.     Palpations: Abdomen is soft.  Musculoskeletal: Normal range of motion.  Skin:    General: Skin is warm.     Capillary Refill: Capillary refill takes less than 2 seconds.  Neurological:     General: No focal deficit present.     Mental Status: He is alert and oriented to person, place, and time.  Psychiatric:        Mood and Affect: Mood normal.        Behavior: Behavior normal.  ED Treatments / Results  Labs (all labs ordered are listed, but only abnormal results are displayed) Labs Reviewed  COMPREHENSIVE METABOLIC PANEL - Abnormal; Notable for the following components:      Result Value   Glucose, Bld 110 (*)    BUN 33 (*)    Creatinine, Ser 1.42 (*)    Calcium 8.1 (*)    Albumin 3.4 (*)     AST 70 (*)    GFR calc non Af Amer 50 (*)    GFR calc Af Amer 58 (*)    All other components within normal limits  CBC WITH DIFFERENTIAL/PLATELET - Abnormal; Notable for the following components:   Lymphs Abs 0.3 (*)    All other components within normal limits  URINALYSIS, ROUTINE W REFLEX MICROSCOPIC - Abnormal; Notable for the following components:   Color, Urine AMBER (*)    APPearance HAZY (*)    Specific Gravity, Urine 1.031 (*)    Hgb urine dipstick MODERATE (*)    Ketones, ur 20 (*)    Protein, ur >=300 (*)    All other components within normal limits    EKG None  Radiology Dg Chest Portable 1 View  Result Date: 09/06/2018 CLINICAL DATA:  Fever and weakness.  COVID-19 positive. EXAM: PORTABLE CHEST 1 VIEW COMPARISON:  Chest x-ray dated September 04, 2018. FINDINGS: The heart size and mediastinal contours are within normal limits. Normal pulmonary vascularity. Chronically coarsened interstitial markings are similar to prior study. New subtle hazy opacities at both lung bases. No focal consolidation, pleural effusion, or pneumothorax. No acute osseous abnormality. Old right-sided rib fractures again noted. IMPRESSION: New subtle hazy airspace disease at both lung bases, consistent with history of atypical viral pneumonia. Electronically Signed   By: Obie Dredge M.D.   On: 09/06/2018 16:39   Dg Chest Port 1 View  Result Date: 09/04/2018 CLINICAL DATA:  68 year old male with fever. EXAM: PORTABLE CHEST 1 VIEW COMPARISON:  Chest CT dated 05/30/2018 FINDINGS: There is a mild background of emphysema. No focal consolidation, pleural effusion, or pneumothorax. Top-normal cardiac silhouette. Old right lateral rib fracture deformities. No acute osseous pathology. IMPRESSION: No active disease. Electronically Signed   By: Elgie Collard M.D.   On: 09/04/2018 22:49    Procedures Procedures (including critical care time)  Medications Ordered in ED Medications  acetaminophen (TYLENOL)  tablet 1,000 mg (has no administration in time range)  sodium chloride 0.9 % bolus 1,000 mL (0 mLs Intravenous Stopped 09/06/18 1843)  ondansetron (ZOFRAN) injection 4 mg (4 mg Intravenous Given 09/06/18 1646)     Initial Impression / Assessment and Plan / ED Course  I have reviewed the triage vital signs and the nursing notes.  Pertinent labs & imaging results that were available during my care of the patient were reviewed by me and considered in my medical decision making (see chart for details).    ELSTER CORBELLO was evaluated in Emergency Department on 09/06/2018 for the symptoms described in the history of present illness. He was evaluated in the context of the global COVID-19 pandemic, which necessitated consideration that the patient might be at risk for infection with the SARS-CoV-2 virus that causes COVID-19. Institutional protocols and algorithms that pertain to the evaluation of patients at risk for COVID-19 are in a state of rapid change based on information released by regulatory bodies including the CDC and federal and state organizations. These policies and algorithms were followed during the patient's care in the ED.  Pt is feeling much better.  He is ready to go home.  He knows to continue to self isolate.  Pt given covid d/c instructions and told to continue to isolate.  Return if worse.  He did develop a fever while here which was treated with tylenol.    Final Clinical Impressions(s) / ED Diagnoses   Final diagnoses:  Dehydration  Nausea  History of 2019 novel coronavirus disease (COVID-19)    ED Discharge Orders         Ordered    ondansetron (ZOFRAN ODT) 4 MG disintegrating tablet  Every 8 hours PRN     09/06/18 1801           Jacalyn Lefevre, MD 09/06/18 Julio Sicks    Jacalyn Lefevre, MD 09/06/18 [phone]

## 2018-09-08 ENCOUNTER — Encounter (HOSPITAL_COMMUNITY): Payer: Self-pay | Admitting: *Deleted

## 2018-09-08 ENCOUNTER — Inpatient Hospital Stay (HOSPITAL_COMMUNITY)
Admission: EM | Admit: 2018-09-08 | Discharge: 2018-09-20 | DRG: 177 | Disposition: A | Discharge status: Home / Self Care | Payer: BLUE CROSS/BLUE SHIELD | Attending: Internal Medicine | Admitting: Internal Medicine

## 2018-09-08 ENCOUNTER — Emergency Department (HOSPITAL_COMMUNITY): Payer: BLUE CROSS/BLUE SHIELD

## 2018-09-08 ENCOUNTER — Other Ambulatory Visit: Payer: Self-pay

## 2018-09-08 DIAGNOSIS — Z7902 Long term (current) use of antithrombotics/antiplatelets: Secondary | ICD-10-CM

## 2018-09-08 DIAGNOSIS — R0902 Hypoxemia: Secondary | ICD-10-CM

## 2018-09-08 DIAGNOSIS — J1289 Other viral pneumonia: Secondary | ICD-10-CM | POA: Diagnosis present

## 2018-09-08 DIAGNOSIS — I252 Old myocardial infarction: Secondary | ICD-10-CM

## 2018-09-08 DIAGNOSIS — J80 Acute respiratory distress syndrome: Secondary | ICD-10-CM | POA: Diagnosis present

## 2018-09-08 DIAGNOSIS — M19042 Primary osteoarthritis, left hand: Secondary | ICD-10-CM | POA: Diagnosis not present

## 2018-09-08 DIAGNOSIS — Z955 Presence of coronary angioplasty implant and graft: Secondary | ICD-10-CM

## 2018-09-08 DIAGNOSIS — J9690 Respiratory failure, unspecified, unspecified whether with hypoxia or hypercapnia: Secondary | ICD-10-CM

## 2018-09-08 DIAGNOSIS — L930 Discoid lupus erythematosus: Secondary | ICD-10-CM | POA: Diagnosis present

## 2018-09-08 DIAGNOSIS — J449 Chronic obstructive pulmonary disease, unspecified: Secondary | ICD-10-CM | POA: Diagnosis not present

## 2018-09-08 DIAGNOSIS — Z8249 Family history of ischemic heart disease and other diseases of the circulatory system: Secondary | ICD-10-CM

## 2018-09-08 DIAGNOSIS — Z87891 Personal history of nicotine dependence: Secondary | ICD-10-CM

## 2018-09-08 DIAGNOSIS — Z825 Family history of asthma and other chronic lower respiratory diseases: Secondary | ICD-10-CM

## 2018-09-08 DIAGNOSIS — I714 Abdominal aortic aneurysm, without rupture: Secondary | ICD-10-CM | POA: Diagnosis present

## 2018-09-08 DIAGNOSIS — M19041 Primary osteoarthritis, right hand: Secondary | ICD-10-CM | POA: Diagnosis present

## 2018-09-08 DIAGNOSIS — R197 Diarrhea, unspecified: Secondary | ICD-10-CM | POA: Diagnosis present

## 2018-09-08 DIAGNOSIS — Z66 Do not resuscitate: Secondary | ICD-10-CM | POA: Diagnosis not present

## 2018-09-08 DIAGNOSIS — Z7189 Other specified counseling: Secondary | ICD-10-CM

## 2018-09-08 DIAGNOSIS — J1282 Pneumonia due to coronavirus disease 2019: Secondary | ICD-10-CM | POA: Diagnosis present

## 2018-09-08 DIAGNOSIS — J439 Emphysema, unspecified: Secondary | ICD-10-CM | POA: Diagnosis present

## 2018-09-08 DIAGNOSIS — E039 Hypothyroidism, unspecified: Secondary | ICD-10-CM | POA: Diagnosis present

## 2018-09-08 DIAGNOSIS — I2510 Atherosclerotic heart disease of native coronary artery without angina pectoris: Secondary | ICD-10-CM | POA: Diagnosis not present

## 2018-09-08 DIAGNOSIS — Z23 Encounter for immunization: Secondary | ICD-10-CM

## 2018-09-08 DIAGNOSIS — M47899 Other spondylosis, site unspecified: Secondary | ICD-10-CM | POA: Diagnosis not present

## 2018-09-08 DIAGNOSIS — K219 Gastro-esophageal reflux disease without esophagitis: Secondary | ICD-10-CM | POA: Diagnosis present

## 2018-09-08 DIAGNOSIS — H9190 Unspecified hearing loss, unspecified ear: Secondary | ICD-10-CM | POA: Diagnosis not present

## 2018-09-08 DIAGNOSIS — I10 Essential (primary) hypertension: Secondary | ICD-10-CM | POA: Diagnosis present

## 2018-09-08 DIAGNOSIS — R040 Epistaxis: Secondary | ICD-10-CM | POA: Diagnosis not present

## 2018-09-08 DIAGNOSIS — Z8673 Personal history of transient ischemic attack (TIA), and cerebral infarction without residual deficits: Secondary | ICD-10-CM

## 2018-09-08 DIAGNOSIS — Z7982 Long term (current) use of aspirin: Secondary | ICD-10-CM | POA: Diagnosis not present

## 2018-09-08 DIAGNOSIS — E871 Hypo-osmolality and hyponatremia: Secondary | ICD-10-CM | POA: Diagnosis not present

## 2018-09-08 DIAGNOSIS — Z888 Allergy status to other drugs, medicaments and biological substances status: Secondary | ICD-10-CM

## 2018-09-08 DIAGNOSIS — Z79890 Hormone replacement therapy (postmenopausal): Secondary | ICD-10-CM

## 2018-09-08 DIAGNOSIS — J810 Acute pulmonary edema: Secondary | ICD-10-CM | POA: Diagnosis not present

## 2018-09-08 DIAGNOSIS — J9601 Acute respiratory failure with hypoxia: Secondary | ICD-10-CM | POA: Diagnosis not present

## 2018-09-08 DIAGNOSIS — E876 Hypokalemia: Secondary | ICD-10-CM | POA: Diagnosis not present

## 2018-09-08 DIAGNOSIS — I447 Left bundle-branch block, unspecified: Secondary | ICD-10-CM | POA: Diagnosis present

## 2018-09-08 DIAGNOSIS — Z882 Allergy status to sulfonamides status: Secondary | ICD-10-CM

## 2018-09-08 DIAGNOSIS — G4733 Obstructive sleep apnea (adult) (pediatric): Secondary | ICD-10-CM | POA: Diagnosis present

## 2018-09-08 DIAGNOSIS — E785 Hyperlipidemia, unspecified: Secondary | ICD-10-CM | POA: Diagnosis present

## 2018-09-08 LAB — URINALYSIS, ROUTINE W REFLEX MICROSCOPIC
Bilirubin Urine: NEGATIVE
Glucose, UA: NEGATIVE mg/dL
Ketones, ur: 5 mg/dL — AB
Leukocytes,Ua: NEGATIVE
Nitrite: NEGATIVE
Protein, ur: 100 mg/dL — AB
Specific Gravity, Urine: 1.027 (ref 1.005–1.030)
pH: 6 (ref 5.0–8.0)

## 2018-09-08 LAB — FERRITIN: Ferritin: 688 ng/mL — ABNORMAL HIGH (ref 24–336)

## 2018-09-08 LAB — CBC WITH DIFFERENTIAL/PLATELET
Abs Immature Granulocytes: 0.03 10*3/uL (ref 0.00–0.07)
Basophils Absolute: 0 10*3/uL (ref 0.0–0.1)
Basophils Relative: 0 %
Eosinophils Absolute: 0 10*3/uL (ref 0.0–0.5)
Eosinophils Relative: 0 %
HCT: 43.4 % (ref 39.0–52.0)
Hemoglobin: 14.2 g/dL (ref 13.0–17.0)
Immature Granulocytes: 0 %
Lymphocytes Relative: 6 %
Lymphs Abs: 0.4 10*3/uL — ABNORMAL LOW (ref 0.7–4.0)
MCH: 28.7 pg (ref 26.0–34.0)
MCHC: 32.7 g/dL (ref 30.0–36.0)
MCV: 87.9 fL (ref 80.0–100.0)
Monocytes Absolute: 0.3 10*3/uL (ref 0.1–1.0)
Monocytes Relative: 4 %
Neutro Abs: 6.9 10*3/uL (ref 1.7–7.7)
Neutrophils Relative %: 90 %
Platelets: 197 10*3/uL (ref 150–400)
RBC: 4.94 MIL/uL (ref 4.22–5.81)
RDW: 13.1 % (ref 11.5–15.5)
WBC: 7.7 10*3/uL (ref 4.0–10.5)
nRBC: 0 % (ref 0.0–0.2)

## 2018-09-08 LAB — COMPREHENSIVE METABOLIC PANEL
ALT: 49 U/L — ABNORMAL HIGH (ref 0–44)
AST: 92 U/L — ABNORMAL HIGH (ref 15–41)
Albumin: 2.9 g/dL — ABNORMAL LOW (ref 3.5–5.0)
Alkaline Phosphatase: 67 U/L (ref 38–126)
Anion gap: 9 (ref 5–15)
BUN: 28 mg/dL — ABNORMAL HIGH (ref 8–23)
CO2: 24 mmol/L (ref 22–32)
Calcium: 7.5 mg/dL — ABNORMAL LOW (ref 8.9–10.3)
Chloride: 102 mmol/L (ref 98–111)
Creatinine, Ser: 1.19 mg/dL (ref 0.61–1.24)
GFR calc Af Amer: >60 mL/min (ref >60)
GFR calc non Af Amer: >60 mL/min (ref >60)
Glucose, Bld: 113 mg/dL — ABNORMAL HIGH (ref 70–99)
Potassium: 3.6 mmol/L (ref 3.5–5.1)
Sodium: 135 mmol/L (ref 135–145)
Total Bilirubin: 0.8 mg/dL (ref 0.3–1.2)
Total Protein: 6.5 g/dL (ref 6.5–8.1)

## 2018-09-08 LAB — TROPONIN I: Troponin I: 0.05 ng/mL (ref <0.03)

## 2018-09-08 LAB — LACTIC ACID, PLASMA: Lactic Acid, Venous: 1.7 mmol/L (ref 0.5–1.9)

## 2018-09-08 LAB — BRAIN NATRIURETIC PEPTIDE: B Natriuretic Peptide: 86.7 pg/mL (ref 0.0–100.0)

## 2018-09-08 LAB — D-DIMER, QUANTITATIVE: D-Dimer, Quant: 1.61 ug/mL-FEU — ABNORMAL HIGH (ref 0.00–0.50)

## 2018-09-08 LAB — LACTATE DEHYDROGENASE: LDH: 403 U/L — ABNORMAL HIGH (ref 98–192)

## 2018-09-08 MED ORDER — CLOPIDOGREL BISULFATE 75 MG PO TABS
75.0000 mg | ORAL_TABLET | Freq: Every day | ORAL | Status: DC
  Administered 2018-09-09 – 2018-09-20 (×12): 75 mg via ORAL
  Filled 2018-09-08 (×13): qty 1

## 2018-09-08 MED ORDER — AMLODIPINE BESYLATE 5 MG PO TABS
5.0000 mg | ORAL_TABLET | Freq: Every day | ORAL | Status: DC
  Administered 2018-09-09 – 2018-09-11 (×3): 5 mg via ORAL
  Filled 2018-09-08 (×4): qty 1

## 2018-09-08 MED ORDER — LEVOTHYROXINE SODIUM 50 MCG PO TABS
175.0000 ug | ORAL_TABLET | Freq: Every day | ORAL | Status: DC
  Administered 2018-09-09 – 2018-09-18 (×11): 175 ug via ORAL
  Filled 2018-09-08 (×12): qty 1

## 2018-09-08 MED ORDER — INFLUENZA VAC SPLIT HIGH-DOSE 0.5 ML IM SUSY
0.5000 mL | PREFILLED_SYRINGE | INTRAMUSCULAR | Status: AC
  Administered 2018-09-20: 0.5 mL via INTRAMUSCULAR
  Filled 2018-09-08 (×2): qty 0.5

## 2018-09-08 MED ORDER — ALBUTEROL SULFATE HFA 108 (90 BASE) MCG/ACT IN AERS
1.0000 | INHALATION_SPRAY | Freq: Two times a day (BID) | RESPIRATORY_TRACT | Status: DC | PRN
  Administered 2018-09-09 – 2018-09-10 (×2): 2 via RESPIRATORY_TRACT
  Filled 2018-09-08: qty 6.7

## 2018-09-08 MED ORDER — ENOXAPARIN SODIUM 40 MG/0.4ML ~~LOC~~ SOLN
40.0000 mg | SUBCUTANEOUS | Status: DC
  Administered 2018-09-08 – 2018-09-19 (×12): 40 mg via SUBCUTANEOUS
  Filled 2018-09-08 (×13): qty 0.4

## 2018-09-08 MED ORDER — ASPIRIN EC 81 MG PO TBEC
81.0000 mg | DELAYED_RELEASE_TABLET | Freq: Every day | ORAL | Status: DC
  Administered 2018-09-09 – 2018-09-20 (×12): 81 mg via ORAL
  Filled 2018-09-08 (×13): qty 1

## 2018-09-08 MED ORDER — UMECLIDINIUM-VILANTEROL 62.5-25 MCG/INH IN AEPB
1.0000 | INHALATION_SPRAY | Freq: Every day | RESPIRATORY_TRACT | Status: DC
  Administered 2018-09-09 – 2018-09-20 (×12): 1 via RESPIRATORY_TRACT
  Filled 2018-09-08: qty 14

## 2018-09-08 MED ORDER — HYDROCHLOROTHIAZIDE 12.5 MG PO CAPS
12.5000 mg | ORAL_CAPSULE | ORAL | Status: DC
  Administered 2018-09-09: 12.5 mg via ORAL
  Filled 2018-09-08: qty 1

## 2018-09-08 MED ORDER — ASPIRIN EC 81 MG PO TBEC: 81.0000 mg | DELAYED_RELEASE_TABLET | Freq: Every day | ORAL | Status: DC

## 2018-09-08 MED ORDER — METOPROLOL SUCCINATE ER 25 MG PO TB24
25.0000 mg | ORAL_TABLET | Freq: Every day | ORAL | Status: DC
  Administered 2018-09-08 – 2018-09-11 (×4): 25 mg via ORAL
  Filled 2018-09-08 (×4): qty 1

## 2018-09-08 MED ORDER — PNEUMOCOCCAL VAC POLYVALENT 25 MCG/0.5ML IJ INJ
0.5000 mL | INJECTION | INTRAMUSCULAR | Status: AC
  Administered 2018-09-20: 0.5 mL via INTRAMUSCULAR
  Filled 2018-09-08 (×2): qty 0.5

## 2018-09-08 NOTE — ED Provider Notes (Signed)
[facility]-EMERGENCY DEPT Provider Note   CSN: [id number] Arrival date & time: 09/08/18  1354    History   Chief Complaint Chief Complaint  Patient presents with  . Low Sats    HPI Matthew Evans is a 68 y.o. male.     HPI Patient was seen 4/1 for fever measured at home of 101.  At that time, he had pending COVID-19 testing.  That had been done by his PCP.  Test resulted positive.  Patient reports his symptoms have not been severe.  He reports that he has been monitoring his oxygen level.  His wife checked it this morning and his oxygen read at 86%.  At that point he was advised to come to the hospital.  Patient denies home oxygen use.  He reports he feels pretty good.  He does not note that he feels significantly short of breath.  At onset he did have some chills of diarrhea and muscle aches.  He denies that this seems to have worsened. Past Medical History:  Diagnosis Date  . AAA (abdominal aortic aneurysm) (HCC)    mild; doppler 08/29/12-3.4x3.7  . Arthritis    "hands, back" (09/28/2016)  . CAD (coronary artery disease)    echo 07/26/12-EF 55-60%; myoview 07/26/12-low normal wall motion with mild apical hypokinesis as well a septal wall motion consistent with LBBB  . Carotid artery occlusion   . Chronic lower back pain   . Cutaneous lupus erythematosus    [name], [name]  . Dementia (HCC)    "moderate; dx'd in ~ 2011" (09/28/2016)  . Dermatitis   . Emphysema   . GERD (gastroesophageal reflux disease)   . H/O hiatal hernia   . Headache(784.0)   . History of palpitations   . HTN (hypertension)   . Hyperlipidemia   . Hypothyroidism    HX OF RADIOACTIVE IODINE   . Memory loss    d/t mini stroke  . Mini stroke (HCC) ~ 2011  . Myocardial infarction (HCC) 09/2007   PCI to RCA  . Shortness of breath    WITH EXERTION  . Sleep apnea    sleep study 12/14/06 ahi 16.89, during rem 12.3,  supine ahi 57.49; cpap 02/22/07-c-flex of 3 at 13cm h2o; PT STATES HE DOES NOT USE HIS CPAP EVERY NIGHT  . Sleep apnea    "aien't worn mask in 3 years" (09/28/2016)  . Stroke [facility])    PT STATES NEUROLOGIST TOLD HIM HE HAD HAD MINI STROKE  . Thyroid disease    hypothyroidism    Patient Active Problem List   Diagnosis Date Noted  . Solitary pulmonary nodule 10/04/2016  . Unstable angina (HCC)   . Abnormal nuclear stress test   . Elevated troponin   . Chest pain 09/28/2016  . Post-op pain 09/28/2016  . Preoperative clearance 08/29/2016  . Left bundle branch block 11/23/2014  . Status post Nissen fundoplication 10/09/2013  . CAD (coronary artery disease) 10/29/2012  . AAA (abdominal aortic aneurysm) (HCC) 10/29/2012  . Hypertensive heart disease 10/29/2012  . Hypothyroid 10/29/2012  . Hyperlipidemia with target LDL less than 70 10/29/2012  . COPD (chronic obstructive pulmonary disease) (HCC) 10/29/2012  . 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  . Dyspnea 09/09/2012    Past Surgical History:  Procedure Laterality Date  . balloon atherotomy     LAD  . BICEPS TENDON REPAIR Right 09/25/2016   "cleaned out spurs  and arthritis" (09/28/2016)  . CARDIAC CATHETERIZATION  08/11/11   EF50-55%, small AAA, no signifiant stent restenosis in RCA , no restenosis of PTCA of LAD, medical therapy  . CARDIAC CATHETERIZATION  09/27/06   cutting balloon arthrotomy of LAD stenosis with dilatation of a 3.5x68mm cutting balloon  . CARDIAC CATHETERIZATION  01/07/09   50-60% progressive dz beyond the stented segment in the RCA , LAD intervention remained patent  . CORONARY ANGIOPLASTY WITH STENT PLACEMENT  09/24/06   PTCA/stent of RCA with a 3.5x36mm Taxus DES post dilated to 3.46mm  . CORONARY ANGIOPLASTY WITH STENT PLACEMENT Right 09/2007  . ESOPHAGEAL MANOMETRY N/A 08/04/2013   Procedure: ESOPHAGEAL MANOMETRY (EM);  Surgeon: Theda Belfast, MD;  Location: [facility] ENDOSCOPY;  Service: Endoscopy;  Laterality: N/A;  .  HERNIA REPAIR    . HIATAL HERNIA REPAIR  2011?  Marland Kitchen LEFT HEART CATH AND CORONARY ANGIOGRAPHY N/A 10/02/2016   Procedure: Left Heart Cath and Coronary Angiography;  Surgeon: Marykay Lex, MD;  Location: [facility] INVASIVE CV LAB;  Service: Cardiovascular;  Laterality: N/A;  . LEFT HEART CATHETERIZATION WITH CORONARY ANGIOGRAM N/A 08/11/2011   Procedure: LEFT HEART CATHETERIZATION WITH CORONARY ANGIOGRAM;  Surgeon: Lennette Bihari, MD;  Location: [facility] CATH LAB;  Service: Cardiovascular;  Laterality: N/A;  . SHOULDER SURGERY Right         Home Medications    Prior to Admission medications   Medication Sig Start Date End Date Taking? Authorizing Provider  Albuterol Sulfate (PROAIR RESPICLICK) 108 (90 Base) MCG/ACT AEPB Inhale 2 puffs into the lungs every 6 (six) hours as needed. Patient taking differently: Inhale 2 puffs into the lungs every 6 (six) hours as needed (sob and wheezing).  05/23/18   Oretha Milch, MD  amLODipine (NORVASC) 5 MG tablet Take 1 tablet (5 mg total) by mouth daily. 02/20/18   Lennette Bihari, MD  aspirin 81 MG EC tablet TAKE 1 TABLET BY MOUTH EVERY DAY 12/03/17   Lennette Bihari, MD  clopidogrel (PLAVIX) 75 MG tablet TAKE 1 TABLET (75 MG TOTAL) BY MOUTH DAILY WITH BREAKFAST. 02/20/18   Lennette Bihari, MD  cyclobenzaprine (FLEXERIL) 10 MG tablet Take 10 mg by mouth 2 (two) times daily as needed for muscle spasms.  08/31/14   [provider]  Fluticasone-Umeclidin-Vilant (TRELEGY ELLIPTA) 100-62.5-25 MCG/INH AEPB Inhale 1 puff into the lungs daily. 06/20/18   Oretha Milch, MD  guaiFENesin-codeine 100-10 MG/5ML syrup Take 5 mLs by mouth every 6 (six) hours as needed for cough.  09/02/18   [provider]  hydrochlorothiazide (MICROZIDE) 12.5 MG capsule Take 1 capsule (12.5 mg total) by mouth every other day. 02/20/18   Lennette Bihari, MD  HYDROcodone-acetaminophen (NORCO) 7.5-325 MG tablet Take 1 tablet by mouth every 6 (six) hours as needed for moderate pain or severe  pain.  08/31/18   [provider]  isosorbide mononitrate (IMDUR) 60 MG 24 hr tablet Take 1.5 tablets (90 mg total) by mouth daily. 02/20/18   Lennette Bihari, MD  levofloxacin (LEVAQUIN) 500 MG tablet Take 500 mg by mouth daily.    [provider]  levothyroxine (SYNTHROID) 100 MCG tablet Take 1 tablet (100 mcg total) by mouth daily before breakfast. Patient not taking: Reported on 09/06/2018 10/19/16   Lennette Bihari, MD  losartan (COZAAR) 25 MG tablet Take 25 mg by mouth every evening.  06/16/18   [provider]  losartan (COZAAR) 50 MG tablet Take 1.5 tablets (75 mg total)  by mouth daily. Patient not taking: Reported on 09/06/2018 02/20/18 02/15/19  Lennette Bihari, MD  methocarbamol (ROBAXIN) 500 MG tablet Take 500 mg by mouth every 6 (six) hours as needed for muscle spasms.  05/04/18   [provider]  metoprolol succinate (TOPROL-XL) 25 MG 24 hr tablet Take 1 tablet (25 mg total) by mouth daily. 02/20/18   Lennette Bihari, MD  Omega-3 Fatty Acids (FISH OIL) 1200 MG CAPS Take 1,200 mg by mouth 2 (two) times daily.     [provider]  ondansetron (ZOFRAN ODT) 4 MG disintegrating tablet Take 1 tablet (4 mg total) by mouth every 8 (eight) hours as needed. 09/06/18   Jacalyn Lefevre, MD  ondansetron (ZOFRAN) 4 MG tablet Take 4 mg by mouth every 8 (eight) hours as needed for nausea or vomiting.    [provider]  SYNTHROID 175 MCG tablet Take 175 mcg by mouth daily before breakfast.  06/26/18   [provider]  umeclidinium-vilanterol (ANORO ELLIPTA) 62.5-25 MCG/INH AEPB INHALE 1 PUFF INTO THE LUNGS DAILY.  Needs office visit for refills. Patient not taking: Reported on 09/06/2018 05/09/18   Oretha Milch, MD    Family History Family History  Problem Relation Age of Onset  . Emphysema Mother   . Heart disease Father   . Mesothelioma Father   . Heart attack Father     Social History Social History   Tobacco Use  . Smoking status:  Former Smoker    Packs/day: 2.00    Years: 45.00    Pack years: 90.00    Types: Cigarettes    Last attempt to quit: 02/04/2010    Years since quitting: 8.5  . Smokeless tobacco: Never Used  Substance Use Topics  . Alcohol use: No    Comment: QUIT 1994  . Drug use: No     Allergies   Niaspan [niacin]; Septra [sulfamethoxazole-trimethoprim]; Simcor [niacin-simvastatin er]; and Zocor [simvastatin]   Review of Systems Review of Systems 10 Systems reviewed and are negative for acute change except as noted in the HPI.  Physical Exam Updated Vital Signs BP (!) 150/66   Pulse 86   Temp 98.5 F (36.9 C) (Oral)   Resp 20   Ht  (1.727 m)   SpO2 94%   BMI 32.18 kg/m   Physical Exam Constitutional:      Comments: Alert with clear mental status.  Mild tachypnea.  rare dry cough.  HENT:     Head: Normocephalic and atraumatic.  Eyes:     Extraocular Movements: Extraocular movements intact.     Conjunctiva/sclera: Conjunctivae normal.  Cardiovascular:     Rate and Rhythm: Normal rate and regular rhythm.  Pulmonary:     Comments: Occasional dry cough.  Mild tachypnea.  Breath sounds are soft throughout.  Fine crackle mid lung field on the right. Abdominal:     General: There is no distension.     Palpations: Abdomen is soft.     Tenderness: There is no abdominal tenderness. There is no guarding.  Musculoskeletal: Normal range of motion.        General: No swelling or tenderness.     Right lower leg: No edema.     Left lower leg: No edema.  Skin:    General: Skin is warm and dry.  Neurological:     General: No focal deficit present.     Mental Status: He is oriented to person, place, and time.     Coordination: Coordination  normal.  Psychiatric:        Mood and Affect: Mood normal.      ED Treatments / Results  Labs (all labs ordered are listed, but only abnormal results are displayed) Labs Reviewed  CULTURE, BLOOD (ROUTINE X 2)  CULTURE, BLOOD (ROUTINE X 2)   URINALYSIS, ROUTINE W REFLEX MICROSCOPIC  BRAIN NATRIURETIC PEPTIDE  CBC WITH DIFFERENTIAL/PLATELET  COMPREHENSIVE METABOLIC PANEL  D-DIMER, QUANTITATIVE (NOT AT [facility])  FERRITIN  LACTATE DEHYDROGENASE  LACTIC ACID, PLASMA  PROLACTIN  TROPONIN I    EKG EKG Interpretation  Date/Time:  Sunday September 08 2018 14:35:55 EDT Ventricular Rate:  84 PR Interval:    QRS Duration: 142 QT Interval:  421 QTC Calculation: 498 R Axis:   -55 Text Interpretation:  Sinus rhythm Left bundle branch block Baseline wander in lead(s) V1 no sig change from previous Confirmed by Arby Barrette [phone]) on 09/08/2018 3:30:56 PM   Radiology Dg Chest Portable 1 View  Result Date: 09/06/2018 CLINICAL DATA:  Fever and weakness.  COVID-19 positive. EXAM: PORTABLE CHEST 1 VIEW COMPARISON:  Chest x-ray dated September 04, 2018. FINDINGS: The heart size and mediastinal contours are within normal limits. Normal pulmonary vascularity. Chronically coarsened interstitial markings are similar to prior study. New subtle hazy opacities at both lung bases. No focal consolidation, pleural effusion, or pneumothorax. No acute osseous abnormality. Old right-sided rib fractures again noted. IMPRESSION: New subtle hazy airspace disease at both lung bases, consistent with history of atypical viral pneumonia. Electronically Signed   By: Obie Dredge M.D.   On: 09/06/2018 16:39    Procedures Procedures (including critical care time)  Medications Ordered in ED Medications - No data to display   Initial Impression / Assessment and Plan / ED Course  I have reviewed the triage vital signs and the nursing notes.  Pertinent labs & imaging results that were available during my care of the patient were reviewed by me and considered in my medical decision making (see chart for details).         Consult: [facility] hospitalist for admission.  Patient oxygen saturation off O2 88% with one dip to 85 that rebounded to high 80s.  On 2 L nasal  cannula, patient is 92%.  Patient has chest x-ray showing bilateral infiltrates.  At this time he has advancing hypoxia and worsening pneumonia with known diagnosis of COVID-19.  Plan for admission.  At this time, patient does have stable respiratory status.  He is comfortable breathing with clear mental status.  Oxygen is remaining at 92% on 2 L.  Currently, not a candidate for intubation.  Do however have concern for worsening condition over the next several days given the patient has now developed bilateral pneumonia and hypoxia.  Final Clinical Impressions(s) / ED Diagnoses   Final diagnoses:  COVID-19 virus infection  Hypoxia    ED Discharge Orders    None       Arby Barrette, MD 09/08/18 [phone]

## 2018-09-08 NOTE — ED Triage Notes (Signed)
Pt come in from home with sats 85%. States he went home on Friday on oxygen but does not have any now (unsure what he means) did not tell anyone he is positive with covid 19.

## 2018-09-08 NOTE — H&P (Signed)
History and Physical  Matthew Evans HER:[id number] DOB: 1950/08/04 DOA: 09/08/2018  Referring physician: Dr Donnald Garre PCP: Moshe Salisbury, NP  Outpatient Specialists: None Patient coming from: Home  Chief Complaint: Shortness of breath  HPI: Matthew Evans is a 68 y.o. male with medical history significant for hypertension, hyperlipidemia, hypothyroidism, coronary artery disease, AAA, COPD, recently diagnosed COVID-19 viral infection, who presented to [facility] ED due to acute onset of shortness of breath with hypoxia.  Onset this morning after waking up.  Patient states he has an pulse oximeter at home.  Checked his oxygen saturation which was 86%.  Became concerned and came to the ED for further evaluation.  Patient was diagnosed with COVID-19 viral infection 4 days ago outpatient and has been self isolating with his wife.  Denies cough, chest pain or any GI symptoms.  States he was feeling febrile this morning and took Tylenol.  [facility] asked to admit due to new onset hypoxia in the setting of covid-19 viral infection.  ED Course: Upon presentation to the ED patient hypoxic with O2 saturation 88% on room air, improved on 2 L of oxygen by nasal cannula to 92%.  Chest x-ray independently reviewed revealed worsening bilateral pulmonary infiltrates.  Patient admitted as observation status due to acute hypoxic respiratory failure secondary to COVID-19 viral infection.  Review of Systems: Review of systems as noted in the HPI. All other systems reviewed and are negative.   Past Medical History:  Diagnosis Date  . AAA (abdominal aortic aneurysm) (HCC)    mild; doppler 08/29/12-3.4x3.7  . Arthritis    "hands, back" (09/28/2016)  . CAD (coronary artery disease)    echo 07/26/12-EF 55-60%; myoview 07/26/12-low normal wall motion with mild apical hypokinesis as well a septal wall motion consistent with LBBB  . Carotid artery occlusion   . Chronic lower back pain   . Cutaneous lupus erythematosus    PT HAS JOINT  PAIN AND RASH ON CHEST, [name]  . Dementia (HCC)    "moderate; dx'd in ~ 2011" (09/28/2016)  . Dermatitis   . Emphysema   . GERD (gastroesophageal reflux disease)   . H/O hiatal hernia   . Headache(784.0)   . History of palpitations   . HTN (hypertension)   . Hyperlipidemia   . Hypothyroidism    HX OF RADIOACTIVE IODINE   . Memory loss    d/t mini stroke  . Mini stroke (HCC) ~ 2011  . Myocardial infarction (HCC) 09/2007   PCI to RCA  . Shortness of breath    WITH EXERTION  . Sleep apnea    sleep study 12/14/06 ahi 16.89, during rem 12.3, supine ahi 57.49; cpap 02/22/07-c-flex of 3 at 13cm h2o; PT STATES HE DOES NOT USE HIS CPAP EVERY NIGHT  . Sleep apnea    "aien't worn mask in 3 years" (09/28/2016)  . Stroke [facility])    PT STATES NEUROLOGIST TOLD HIM HE HAD HAD MINI STROKE  . Thyroid disease    hypothyroidism   Past Surgical History:  Procedure Laterality Date  . balloon atherotomy     LAD  . BICEPS TENDON REPAIR Right 09/25/2016   "cleaned out spurs and arthritis" (09/28/2016)  . CARDIAC CATHETERIZATION  08/11/11   EF50-55%, small AAA, no signifiant stent restenosis in RCA , no restenosis of PTCA of LAD, medical therapy  . CARDIAC CATHETERIZATION  09/27/06   cutting balloon arthrotomy of LAD stenosis with dilatation of a 3.5x69mm cutting balloon  .  CARDIAC CATHETERIZATION  01/07/09   50-60% progressive dz beyond the stented segment in the RCA , LAD intervention remained patent  . CORONARY ANGIOPLASTY WITH STENT PLACEMENT  09/24/06   PTCA/stent of RCA with a 3.5x3520mm Taxus DES post dilated to 3.7096mm  . CORONARY ANGIOPLASTY WITH STENT PLACEMENT Right 09/2007  . ESOPHAGEAL MANOMETRY N/A 08/04/2013   Procedure: ESOPHAGEAL MANOMETRY (EM);  Surgeon: Theda BelfastPatrick D Hung, MD;  Location: [facility];  Service: Endoscopy;  Laterality: N/A;  . HERNIA REPAIR    . HIATAL HERNIA REPAIR  2011?  Marland Kitchen. LEFT HEART CATH AND CORONARY ANGIOGRAPHY N/A 10/02/2016    Procedure: Left Heart Cath and Coronary Angiography;  Surgeon: Marykay Lexavid W Harding, MD;  [facility];  Service: Cardiovascular;  Laterality: N/A;  . LEFT HEART CATHETERIZATION WITH CORONARY ANGIOGRAM N/A 08/11/2011   Procedure: LEFT HEART CATHETERIZATION WITH CORONARY ANGIOGRAM;  Surgeon: Lennette Biharihomas A Kelly, MD;  [facility];  Service: Cardiovascular;  Laterality: N/A;  . SHOULDER SURGERY Right     Social History:  reports that he quit smoking about 8 years ago. His smoking use included cigarettes. He has a 90.00 pack-year smoking history. He has never used smokeless tobacco. He reports that he does not drink alcohol or use drugs.   Allergies  Allergen Reactions  . Niaspan [Niacin] Rash  . Septra [Sulfamethoxazole-Trimethoprim] Rash  . Simcor [Niacin-Simvastatin Er] Rash  . Zocor [Simvastatin] Rash    Family History  Problem Relation Age of Onset  . Emphysema Mother   . Heart disease Father   . Mesothelioma Father   . Heart attack Father      Prior to Admission medications   Medication Sig Start Date End Date Taking? Authorizing Provider  Albuterol Sulfate (PROAIR RESPICLICK) 108 (90 Base) MCG/ACT AEPB Inhale 2 puffs into the lungs every 6 (six) hours as needed. Patient taking differently: Inhale 2 puffs into the lungs every 6 (six) hours as needed (sob and wheezing).  05/23/18   Oretha MilchAlva, Rakesh V, MD  amLODipine (NORVASC) 5 MG tablet Take 1 tablet (5 mg total) by mouth daily. 02/20/18   Lennette BihariKelly, Thomas A, MD  aspirin 81 MG EC tablet TAKE 1 TABLET BY MOUTH EVERY DAY 12/03/17   Lennette BihariKelly, Thomas A, MD  clopidogrel (PLAVIX) 75 MG tablet TAKE 1 TABLET (75 MG TOTAL) BY MOUTH DAILY WITH BREAKFAST. 02/20/18   Lennette BihariKelly, Thomas A, MD  cyclobenzaprine (FLEXERIL) 10 MG tablet Take 10 mg by mouth 2 (two) times daily as needed for muscle spasms.  08/31/14   [provider]  Fluticasone-Umeclidin-Vilant (TRELEGY ELLIPTA) 100-62.5-25 MCG/INH AEPB Inhale 1 puff into the lungs daily.  06/20/18   Oretha MilchAlva, Rakesh V, MD  guaiFENesin-codeine 100-10 MG/5ML syrup Take 5 mLs by mouth every 6 (six) hours as needed for cough.  09/02/18   [provider]  hydrochlorothiazide (MICROZIDE) 12.5 MG capsule Take 1 capsule (12.5 mg total) by mouth every other day. 02/20/18   Lennette BihariKelly, Thomas A, MD  HYDROcodone-acetaminophen (NORCO) 7.5-325 MG tablet Take 1 tablet by mouth every 6 (six) hours as needed for moderate pain or severe pain.  08/31/18   [provider]  isosorbide mononitrate (IMDUR) 60 MG 24 hr tablet Take 1.5 tablets (90 mg total) by mouth daily. 02/20/18   Lennette BihariKelly, Thomas A, MD  levofloxacin (LEVAQUIN) 500 MG tablet Take 500 mg by mouth daily.    [provider]  levothyroxine (SYNTHROID) 100 MCG tablet Take 1 tablet (100 mcg total) by mouth daily before breakfast. Patient  not taking: Reported on 09/06/2018 10/19/16   Lennette Bihari, MD  losartan (COZAAR) 25 MG tablet Take 25 mg by mouth every evening.  06/16/18   [provider]  losartan (COZAAR) 50 MG tablet Take 1.5 tablets (75 mg total) by mouth daily. Patient not taking: Reported on 09/06/2018 02/20/18 02/15/19  Lennette Bihari, MD  methocarbamol (ROBAXIN) 500 MG tablet Take 500 mg by mouth every 6 (six) hours as needed for muscle spasms.  05/04/18   [provider]  metoprolol succinate (TOPROL-XL) 25 MG 24 hr tablet Take 1 tablet (25 mg total) by mouth daily. 02/20/18   Lennette Bihari, MD  Omega-3 Fatty Acids (FISH OIL) 1200 MG CAPS Take 1,200 mg by mouth 2 (two) times daily.     [provider]  ondansetron (ZOFRAN ODT) 4 MG disintegrating tablet Take 1 tablet (4 mg total) by mouth every 8 (eight) hours as needed. 09/06/18   Jacalyn Lefevre, MD  ondansetron (ZOFRAN) 4 MG tablet Take 4 mg by mouth every 8 (eight) hours as needed for nausea or vomiting.    [provider]  SYNTHROID 175 MCG tablet Take 175 mcg by mouth daily before breakfast.  06/26/18   [provider]   umeclidinium-vilanterol (ANORO ELLIPTA) 62.5-25 MCG/INH AEPB INHALE 1 PUFF INTO THE LUNGS DAILY.  Needs office visit for refills. Patient not taking: Reported on 09/06/2018 05/09/18   Oretha Milch, MD    Physical Exam: BP 135/70 (BP Location: Left Arm)   Pulse (!) 103   Temp 98.5 F (36.9 C) (Oral)   Resp (!) 25   Ht 5\' 8"  (1.727 m)   SpO2 90%   BMI 32.18 kg/m   . General: 68 y.o. year-old male well developed well nourished in no acute distress.  Alert and oriented x3. . Cardiovascular: Regular rate and rhythm with no rubs or gallops.  No thyromegaly or JVD noted.  No lower extremity edema. 2/4 pulses in all 4 extremities. Marland Kitchen Respiratory: Diffuse rales bilaterally with no wheezes noted. Good inspiratory effort. . Abdomen: Soft nontender nondistended with normal bowel sounds x4 quadrants. . Muskuloskeletal: No cyanosis, clubbing or edema noted bilaterally . Neuro: CN II-XII intact, strength, sensation, reflexes . Skin: No ulcerative lesions noted or rashes . Psychiatry: Judgement and insight appear normal. Mood is appropriate for condition and setting          Labs on Admission:  Basic Metabolic Panel: Recent Labs  Lab 09/04/18 2301 09/06/18 1648 09/08/18 1140  NA 136 135 135  K 4.4 3.8 3.6  CL 100 98 102  CO2 23 26 24   GLUCOSE 112* 110* 113*  BUN 22 33* 28*  CREATININE 1.33* 1.42* 1.19  CALCIUM 8.5* 8.1* 7.5*   Liver Function Tests: Recent Labs  Lab 09/06/18 1648 09/08/18 1140  AST 70* 92*  ALT 44 49*  ALKPHOS 81 67  BILITOT 0.8 0.8  PROT 7.4 6.5  ALBUMIN 3.4* 2.9*   No results for input(s): LIPASE, AMYLASE in the last 168 hours. No results for input(s): AMMONIA in the last 168 hours. CBC: Recent Labs  Lab 09/04/18 2301 09/06/18 1648 09/08/18 1140  WBC 5.8 6.3 7.7  NEUTROABS 4.4 5.6 6.9  HGB 15.8 15.7 14.2  HCT 48.5 48.6 43.4  MCV 89.0 89.2 87.9  PLT 167 171 197   Cardiac Enzymes: Recent Labs  Lab 09/08/18 1140  TROPONINI 0.05*    BNP (last 3  results) Recent Labs    09/08/18 1140  BNP 86.7  ProBNP (last 3 results) No results for input(s): PROBNP in the last 8760 hours.  CBG: No results for input(s): GLUCAP in the last 168 hours.  Radiological Exams on Admission: Dg Chest Port 1 View  Result Date: 09/08/2018 CLINICAL DATA:  68 year old male with a given history of positive SARS-coV2 test, worsening oxygenation EXAM: PORTABLE CHEST 1 VIEW COMPARISON:  09/06/2018 FINDINGS: Cardiomediastinal silhouette unchanged in size and contour. Compared to prior plain film there is worsening of bilateral, left greater than right reticulonodular opacities and developing airspace opacities. No pneumothorax. No pleural effusion. No displaced fracture. IMPRESSION: Worsening appearance of the chest x-ray, with evidence of multifocal pneumonia. Atypical pathogens including viral included in the differential. Electronically Signed   By: Gilmer Mor D.O.   On: 09/08/2018 15:47    EKG: I independently viewed the EKG done and my findings are as followed: Sinus rhythm rate of 93 with no specific ST-T changes.  Assessment/Plan Present on Admission: . COVID-19 virus infection  Active Problems:   COVID-19 virus infection  Acute hypoxic respiratory failure secondary to COVID-19 viral infection Self-reported hypoxia at home with saturation of 86% Not on oxygen supplementation at baseline Now requiring 2 L of oxygen to maintain O2 saturation greater than 90% Denies any upper respiratory or GI symptoms at this time Denies chest pain or pleuritic pain Reports fever at home; self medicated with Tylenol prior to presentation No leukocytosis on lab studies but elevated LDH, ferritin levels and lymphocytopenia with lymphocyte 0.4 Monitor fever curve  Repeat CBC in the morning  COVID-19 viral infection Diagnosed outpatient 4 days ago Droplet/contact precaution Supportive care Continuous O2 monitoring Maintain O2 saturation greater than 92% MDI  twice daily as needed for shortness of breath or wheezing Closely monitor on telemetry for any acute changes  Coronary artery disease Denies chest pain Resume aspirin and Plavix   Hypertension Resume home medications Hold off losartan  Hypothyroidism Resume Synthroid   DVT prophylaxis: Subcu Lovenox daily  Code Status: Full code  Family Communication: None at bedside  Disposition Plan: Admit to telemetry unit  Consults called: None  Admission status: Observation status    Darlin Drop MD [facility] Pager [phone]  If 7PM-7AM, please contact night-coverage [email or web address] Password [facility]  09/08/2018, 4:49 PM

## 2018-09-08 NOTE — ED Notes (Signed)
Bed: WA18 Expected date:  Expected time:  Means of arrival:  Comments: Triage  

## 2018-09-08 NOTE — ED Notes (Addendum)
Md, pfeiffer at bedside.

## 2018-09-08 NOTE — ED Notes (Signed)
Critical value:    Troponin 0.05    Pfeiffer, MD made aware.   No orders at this time.

## 2018-09-08 NOTE — ED Notes (Signed)
Spoke to lab. Lab is working on blood work.

## 2018-09-08 NOTE — ED Notes (Addendum)
MD turned off 02. Requested to turn off 02 for 10 min for RA sats.  Per MD patient may drink water.

## 2018-09-08 NOTE — ED Notes (Signed)
Bed: WA08 Expected date:  Expected time:  Means of arrival:  Comments: 

## 2018-09-08 NOTE — ED Notes (Addendum)
02 on RA-88%. Patient given urinal and requested to call out once voided.

## 2018-09-08 NOTE — ED Notes (Signed)
Admitting provider at bedside.

## 2018-09-09 DIAGNOSIS — I10 Essential (primary) hypertension: Secondary | ICD-10-CM | POA: Diagnosis present

## 2018-09-09 DIAGNOSIS — K219 Gastro-esophageal reflux disease without esophagitis: Secondary | ICD-10-CM | POA: Diagnosis present

## 2018-09-09 DIAGNOSIS — Z825 Family history of asthma and other chronic lower respiratory diseases: Secondary | ICD-10-CM | POA: Diagnosis not present

## 2018-09-09 DIAGNOSIS — L930 Discoid lupus erythematosus: Secondary | ICD-10-CM | POA: Diagnosis present

## 2018-09-09 DIAGNOSIS — I447 Left bundle-branch block, unspecified: Secondary | ICD-10-CM | POA: Diagnosis present

## 2018-09-09 DIAGNOSIS — Z8249 Family history of ischemic heart disease and other diseases of the circulatory system: Secondary | ICD-10-CM | POA: Diagnosis not present

## 2018-09-09 DIAGNOSIS — J9601 Acute respiratory failure with hypoxia: Secondary | ICD-10-CM | POA: Diagnosis not present

## 2018-09-09 DIAGNOSIS — J80 Acute respiratory distress syndrome: Secondary | ICD-10-CM | POA: Diagnosis present

## 2018-09-09 DIAGNOSIS — Z8673 Personal history of transient ischemic attack (TIA), and cerebral infarction without residual deficits: Secondary | ICD-10-CM | POA: Diagnosis not present

## 2018-09-09 DIAGNOSIS — J439 Emphysema, unspecified: Secondary | ICD-10-CM | POA: Diagnosis present

## 2018-09-09 DIAGNOSIS — E785 Hyperlipidemia, unspecified: Secondary | ICD-10-CM | POA: Diagnosis present

## 2018-09-09 DIAGNOSIS — R0902 Hypoxemia: Secondary | ICD-10-CM

## 2018-09-09 DIAGNOSIS — M47899 Other spondylosis, site unspecified: Secondary | ICD-10-CM | POA: Diagnosis present

## 2018-09-09 DIAGNOSIS — E871 Hypo-osmolality and hyponatremia: Secondary | ICD-10-CM | POA: Diagnosis present

## 2018-09-09 DIAGNOSIS — I252 Old myocardial infarction: Secondary | ICD-10-CM | POA: Diagnosis not present

## 2018-09-09 DIAGNOSIS — Z66 Do not resuscitate: Secondary | ICD-10-CM | POA: Diagnosis not present

## 2018-09-09 DIAGNOSIS — J1289 Other viral pneumonia: Secondary | ICD-10-CM | POA: Diagnosis present

## 2018-09-09 DIAGNOSIS — R197 Diarrhea, unspecified: Secondary | ICD-10-CM | POA: Diagnosis present

## 2018-09-09 DIAGNOSIS — M19042 Primary osteoarthritis, left hand: Secondary | ICD-10-CM | POA: Diagnosis present

## 2018-09-09 DIAGNOSIS — E039 Hypothyroidism, unspecified: Secondary | ICD-10-CM | POA: Diagnosis present

## 2018-09-09 DIAGNOSIS — J810 Acute pulmonary edema: Secondary | ICD-10-CM | POA: Diagnosis not present

## 2018-09-09 DIAGNOSIS — M19041 Primary osteoarthritis, right hand: Secondary | ICD-10-CM | POA: Diagnosis present

## 2018-09-09 DIAGNOSIS — Z7189 Other specified counseling: Secondary | ICD-10-CM | POA: Diagnosis not present

## 2018-09-09 DIAGNOSIS — Z23 Encounter for immunization: Secondary | ICD-10-CM | POA: Diagnosis not present

## 2018-09-09 DIAGNOSIS — Z955 Presence of coronary angioplasty implant and graft: Secondary | ICD-10-CM | POA: Diagnosis not present

## 2018-09-09 DIAGNOSIS — I2510 Atherosclerotic heart disease of native coronary artery without angina pectoris: Secondary | ICD-10-CM | POA: Diagnosis present

## 2018-09-09 LAB — CBC WITH DIFFERENTIAL/PLATELET
Abs Immature Granulocytes: 0.05 10*3/uL (ref 0.00–0.07)
Basophils Absolute: 0 10*3/uL (ref 0.0–0.1)
Basophils Relative: 0 %
Eosinophils Absolute: 0 10*3/uL (ref 0.0–0.5)
Eosinophils Relative: 0 %
HCT: 41.1 % (ref 39.0–52.0)
Hemoglobin: 12.9 g/dL — ABNORMAL LOW (ref 13.0–17.0)
Immature Granulocytes: 1 %
Lymphocytes Relative: 8 %
Lymphs Abs: 0.6 10*3/uL — ABNORMAL LOW (ref 0.7–4.0)
MCH: 27.9 pg (ref 26.0–34.0)
MCHC: 31.4 g/dL (ref 30.0–36.0)
MCV: 89 fL (ref 80.0–100.0)
Monocytes Absolute: 0.2 10*3/uL (ref 0.1–1.0)
Monocytes Relative: 3 %
Neutro Abs: 6.8 10*3/uL (ref 1.7–7.7)
Neutrophils Relative %: 88 %
Platelets: 202 10*3/uL (ref 150–400)
RBC: 4.62 MIL/uL (ref 4.22–5.81)
RDW: 13.2 % (ref 11.5–15.5)
WBC: 7.7 10*3/uL (ref 4.0–10.5)
nRBC: 0 % (ref 0.0–0.2)

## 2018-09-09 LAB — BASIC METABOLIC PANEL
Anion gap: 10 (ref 5–15)
BUN: 25 mg/dL — ABNORMAL HIGH (ref 8–23)
CO2: 23 mmol/L (ref 22–32)
Calcium: 7.4 mg/dL — ABNORMAL LOW (ref 8.9–10.3)
Chloride: 100 mmol/L (ref 98–111)
Creatinine, Ser: 1.15 mg/dL (ref 0.61–1.24)
GFR calc Af Amer: >60 mL/min (ref >60)
GFR calc non Af Amer: >60 mL/min (ref >60)
Glucose, Bld: 103 mg/dL — ABNORMAL HIGH (ref 70–99)
Potassium: 3.7 mmol/L (ref 3.5–5.1)
Sodium: 133 mmol/L — ABNORMAL LOW (ref 135–145)

## 2018-09-09 LAB — PROLACTIN: Prolactin: 8.3 ng/mL (ref 4.0–15.2)

## 2018-09-09 LAB — MRSA PCR SCREENING: MRSA by PCR: NEGATIVE

## 2018-09-09 MED ORDER — FUROSEMIDE 10 MG/ML IJ SOLN
40.0000 mg | Freq: Two times a day (BID) | INTRAMUSCULAR | Status: DC
  Administered 2018-09-09 – 2018-09-11 (×5): 40 mg via INTRAVENOUS
  Filled 2018-09-09 (×6): qty 4

## 2018-09-09 MED ORDER — MONTELUKAST SODIUM 10 MG PO TABS
10.0000 mg | ORAL_TABLET | Freq: Every day | ORAL | Status: DC
  Administered 2018-09-09 – 2018-09-19 (×11): 10 mg via ORAL
  Filled 2018-09-09 (×11): qty 1

## 2018-09-09 MED ORDER — DEXAMETHASONE 2 MG PO TABS
4.0000 mg | ORAL_TABLET | Freq: Once | ORAL | Status: AC
  Administered 2018-09-09: 4 mg via ORAL
  Filled 2018-09-09: qty 1

## 2018-09-09 MED ORDER — HYDROXYCHLOROQUINE SULFATE 200 MG PO TABS
400.0000 mg | ORAL_TABLET | Freq: Two times a day (BID) | ORAL | Status: AC
  Administered 2018-09-09 (×2): 400 mg via ORAL
  Filled 2018-09-09 (×3): qty 2

## 2018-09-09 MED ORDER — POTASSIUM CHLORIDE CRYS ER 20 MEQ PO TBCR
40.0000 meq | EXTENDED_RELEASE_TABLET | Freq: Once | ORAL | Status: AC
  Administered 2018-09-09: 40 meq via ORAL
  Filled 2018-09-09: qty 2

## 2018-09-09 MED ORDER — TOCILIZUMAB 400 MG/20ML IV SOLN
400.0000 mg | Freq: Once | INTRAVENOUS | Status: AC
  Administered 2018-09-09: 400 mg via INTRAVENOUS
  Filled 2018-09-09 (×2): qty 20

## 2018-09-09 MED ORDER — AZITHROMYCIN 250 MG PO TABS
500.0000 mg | ORAL_TABLET | Freq: Every day | ORAL | Status: DC
  Administered 2018-09-09 – 2018-09-17 (×9): 500 mg via ORAL
  Filled 2018-09-09 (×9): qty 2

## 2018-09-09 MED ORDER — SODIUM CHLORIDE 0.9 % IV SOLN
Freq: Once | INTRAVENOUS | Status: AC
  Administered 2018-09-09: 12:00:00 via INTRAVENOUS

## 2018-09-09 MED ORDER — VITAMIN D 25 MCG (1000 UNIT) PO TABS
1000.0000 [IU] | ORAL_TABLET | Freq: Every day | ORAL | Status: DC
  Administered 2018-09-09 – 2018-09-20 (×12): 1000 [IU] via ORAL
  Filled 2018-09-09 (×14): qty 1

## 2018-09-09 MED ORDER — ZINC SULFATE 220 (50 ZN) MG PO CAPS
220.0000 mg | ORAL_CAPSULE | Freq: Every day | ORAL | Status: DC
  Administered 2018-09-09 – 2018-09-16 (×8): 220 mg via ORAL
  Filled 2018-09-09 (×10): qty 1

## 2018-09-09 MED ORDER — HYDROXYCHLOROQUINE SULFATE 200 MG PO TABS
200.0000 mg | ORAL_TABLET | Freq: Two times a day (BID) | ORAL | Status: AC
  Administered 2018-09-10 – 2018-09-13 (×8): 200 mg via ORAL
  Filled 2018-09-09 (×7): qty 1

## 2018-09-09 MED ORDER — VITAMIN C 500 MG PO TABS
1000.0000 mg | ORAL_TABLET | Freq: Three times a day (TID) | ORAL | Status: DC
  Administered 2018-09-09 – 2018-09-16 (×22): 1000 mg via ORAL
  Filled 2018-09-09 (×22): qty 2

## 2018-09-09 MED ORDER — FUROSEMIDE 10 MG/ML IJ SOLN
40.0000 mg | Freq: Every day | INTRAMUSCULAR | Status: DC
  Administered 2018-09-09: 12:00:00 40 mg via INTRAVENOUS
  Filled 2018-09-09: qty 4

## 2018-09-09 MED ORDER — LORATADINE 10 MG PO TABS
10.0000 mg | ORAL_TABLET | Freq: Every day | ORAL | Status: DC
  Administered 2018-09-09 – 2018-09-20 (×12): 10 mg via ORAL
  Filled 2018-09-09 (×12): qty 1

## 2018-09-09 NOTE — Progress Notes (Signed)
Infection Prevention  Received call from: Toniann Fail with [facility] Dept and received verification that patient is COVID + from his PCP office. Flagged patient and added to line list.

## 2018-09-09 NOTE — Consult Note (Signed)
NAME:  Matthew Evans, MRN:  [id number], DOB:  01-13-1951, LOS: 0 ADMISSION DATE:  09/08/2018, CONSULTATION DATE:  09/09/2018 REFERRING MD:  Towana Badger MD, CHIEF COMPLAINT:  Resp failure, COVID infection  Brief History   68 year old with mild COPD, hypertension, hyperlipidemia coronary artery disease.  Diagnosed with COVID 19 infection as outpatient  Past Medical History  Hypertension, hyperlipidemia, hypothyroidism, coronary artery disease, AAA, COPD  Significant Hospital Events   4/6- Transfer to ICU on 9 lt O2  Consults:  4/6 PCCM  Procedures:    Significant Diagnostic Tests:  Chest x-ray 4/6-worsening bilateral interstitial opacities  Micro Data:  Blood culture 4/5-pending  Antimicrobials:  Azithromycin 4/6  Interim history/subjective:    Objective   Blood pressure (!) 143/65, pulse 81, temperature 100 F (37.8 C), temperature source Oral, resp. rate (!) 24, height 5\' 8"  (1.727 m), weight 90.1 kg, SpO2 91 %.        Intake/Output Summary (Last 24 hours) at 09/09/2018 1014 Last data filed at 09/09/2018 5701 Gross per 24 hour  Intake 1520 ml  Output 200 ml  Net 1320 ml   Filed Weights   09/08/18 2100  Weight: 90.1 kg   Gen:      No acute distress HEENT:  EOMI, sclera anicteric Neck:     No masses; no thyromegaly Lungs:    Clear to auscultation bilaterally; normal respiratory effort CV:         Regular rate and rhythm; no murmurs Abd:      + bowel sounds; soft, non-tender; no palpable masses, no distension Ext:    No edema; adequate peripheral perfusion Skin:      Warm and dry; no rash Neuro: alert and oriented x 3 Psych: normal mood and affect  Resolved Hospital Problem list     Assessment & Plan:  68 year old with baseline COPD admitted with COVID-19 infection, respiratory failure  ARDS secondary to COVID-19 Close monitoring for intubation needs Chloroquine, azithromycin IL 6 inhibitor today Encouraged pt to lie prone for as long as possible.    COPD Continue anoro Albuterol HFA prn He is not wheezing and we would like to avoid steroids in setting of COVID  Goals of care Had a long discussion with patient today. He was able to participate fully. He states that he does not want intubation if the resp status worsens and does not want CPR/Defib in case of cardiac arrest. Code status changed to DNR  Best practice:  Diet: NPO Pain/Anxiety/Delirium protocol (if indicated): NA VAP protocol (if indicated): NA DVT prophylaxis: Lovenox GI prophylaxis: NA Glucose control: Monitor blood sugars Mobility: Bed Code Status: DNR Family Communication: Patient updated 4/6 Disposition: ICU  Labs   CBC: Recent Labs  Lab 09/04/18 2301 09/06/18 1648 09/08/18 1140 09/09/18 0703  WBC 5.8 6.3 7.7 7.7  NEUTROABS 4.4 5.6 6.9 6.8  HGB 15.8 15.7 14.2 12.9*  HCT 48.5 48.6 43.4 41.1  MCV 89.0 89.2 87.9 89.0  PLT 167 171 197 202    Basic Metabolic Panel: Recent Labs  Lab 09/04/18 2301 09/06/18 1648 09/08/18 1140 09/09/18 0703  NA 136 135 135 133*  K 4.4 3.8 3.6 3.7  CL 100 98 102 100  CO2 23 26 24 23   GLUCOSE 112* 110* 113* 103*  BUN 22 33* 28* 25*  CREATININE 1.33* 1.42* 1.19 1.15  CALCIUM 8.5* 8.1* 7.5* 7.4*   GFR: Estimated Creatinine Clearance: 67 mL/min (by C-G formula based on SCr of 1.15 mg/dL). Recent Labs  Lab 09/04/18 2301  09/06/18 1648 09/08/18 1140 09/09/18 0703  WBC 5.8 6.3 7.7 7.7  LATICACIDVEN  --   --  1.7  --     Liver Function Tests: Recent Labs  Lab 09/06/18 1648 09/08/18 1140  AST 70* 92*  ALT 44 49*  ALKPHOS 81 67  BILITOT 0.8 0.8  PROT 7.4 6.5  ALBUMIN 3.4* 2.9*   No results for input(s): LIPASE, AMYLASE in the last 168 hours. No results for input(s): AMMONIA in the last 168 hours.  ABG    Component Value Date/Time   TCO2 29 09/28/2016 0959     Coagulation Profile: No results for input(s): INR, PROTIME in the last 168 hours.  Cardiac Enzymes: Recent Labs  Lab 09/08/18 1140   TROPONINI 0.05*    HbA1C: No results found for: HGBA1C  CBG: No results for input(s): GLUCAP in the last 168 hours.  Review of Systems:   All negative; except for those that are bolded, which indicate positives.  Constitutional: weight loss, weight gain, night sweats, fevers, chills, fatigue, weakness.  HEENT: headaches, sore throat, sneezing, nasal congestion, post nasal drip, difficulty swallowing, tooth/dental problems, visual complaints, visual changes, ear aches. Neuro: difficulty with speech, weakness, numbness, ataxia. CV:  chest pain, orthopnea, PND, swelling in lower extremities, dizziness, palpitations, syncope.  Resp: cough, hemoptysis, dyspnea, wheezing. GI: heartburn, indigestion, abdominal pain, nausea, vomiting, diarrhea, constipation, change in bowel habits, loss of appetite, hematemesis, melena, hematochezia.  GU: dysuria, change in color of urine, urgency or frequency, flank pain, hematuria. MSK: joint pain or swelling, decreased range of motion. Psych: change in mood or affect, depression, anxiety, suicidal ideations, homicidal ideations. Skin: rash, itching, bruising.  Past Medical History  He,  has a past medical history of AAA (abdominal aortic aneurysm) (HCC), Arthritis, CAD (coronary artery disease), Carotid artery occlusion, Chronic lower back pain, Cutaneous lupus erythematosus, Dementia (HCC), Dermatitis, Emphysema, GERD (gastroesophageal reflux disease), H/O hiatal hernia, Headache(784.0), History of palpitations, HTN (hypertension), Hyperlipidemia, Hypothyroidism, Memory loss, Mini stroke (HCC) (~ 2011), Myocardial infarction (HCC) (09/2007), Shortness of breath, Sleep apnea, Sleep apnea, Stroke (HCC), and Thyroid disease.   Surgical History    Past Surgical History:  Procedure Laterality Date   balloon atherotomy     LAD   BICEPS TENDON REPAIR Right 09/25/2016   "cleaned out spurs and arthritis" (09/28/2016)   CARDIAC CATHETERIZATION  08/11/11    EF50-55%, small AAA, no signifiant stent restenosis in RCA , no restenosis of PTCA of LAD, medical therapy   CARDIAC CATHETERIZATION  09/27/06   cutting balloon arthrotomy of LAD stenosis with dilatation of a 3.5x9315mm cutting balloon   CARDIAC CATHETERIZATION  01/07/09   50-60% progressive dz beyond the stented segment in the RCA , LAD intervention remained patent   CORONARY ANGIOPLASTY WITH STENT PLACEMENT  09/24/06   PTCA/stent of RCA with a 3.5x6120mm Taxus DES post dilated to 3.2796mm   CORONARY ANGIOPLASTY WITH STENT PLACEMENT Right 09/2007   ESOPHAGEAL MANOMETRY N/A 08/04/2013   Procedure: ESOPHAGEAL MANOMETRY (EM);  Surgeon: Theda BelfastPatrick D Hung, MD;  Location: [facility];  Service: Endoscopy;  Laterality: N/A;   HERNIA REPAIR     HIATAL HERNIA REPAIR  2011?   LEFT HEART CATH AND CORONARY ANGIOGRAPHY N/A 10/02/2016   Procedure: Left Heart Cath and Coronary Angiography;  Surgeon: Marykay Lexavid W Harding, MD;  [facility];  Service: Cardiovascular;  Laterality: N/A;   LEFT HEART CATHETERIZATION WITH CORONARY ANGIOGRAM N/A 08/11/2011   Procedure: LEFT HEART CATHETERIZATION WITH CORONARY ANGIOGRAM;  Surgeon: Maisie Fushomas  Alphonsus Sias, MD;  Location: [facility] CATH LAB;  Service: Cardiovascular;  Laterality: N/A;   SHOULDER SURGERY Right      Social History   reports that he quit smoking about 8 years ago. His smoking use included cigarettes. He has a 90.00 pack-year smoking history. He has never used smokeless tobacco. He reports that he does not drink alcohol or use drugs.   Family History   His family history includes Emphysema in his mother; Heart attack in his father; Heart disease in his father; Mesothelioma in his father.   Allergies Allergies  Allergen Reactions   Niaspan [Niacin] Rash   Septra [Sulfamethoxazole-Trimethoprim] Rash   Simcor [Niacin-Simvastatin Er] Rash   Zocor [Simvastatin] Rash     Home Medications  Prior to Admission medications   Medication Sig Start Date End Date  Taking? Authorizing Provider  acetaminophen (TYLENOL) 500 MG tablet Take 1,000 mg by mouth every 6 (six) hours as needed for moderate pain.   Yes [provider]  Albuterol Sulfate (PROAIR RESPICLICK) 108 (90 Base) MCG/ACT AEPB Inhale 2 puffs into the lungs every 6 (six) hours as needed. Patient taking differently: Inhale 2 puffs into the lungs every 6 (six) hours as needed (sob and wheezing).  05/23/18  Yes Oretha Milch, MD  amLODipine (NORVASC) 5 MG tablet Take 1 tablet (5 mg total) by mouth daily. 02/20/18  Yes Lennette Bihari, MD  aspirin 81 MG EC tablet TAKE 1 TABLET BY MOUTH EVERY DAY Patient taking differently: Take 81 mg by mouth daily.  12/03/17  Yes Lennette Bihari, MD  clopidogrel (PLAVIX) 75 MG tablet TAKE 1 TABLET (75 MG TOTAL) BY MOUTH DAILY WITH BREAKFAST. Patient taking differently: Take 75 mg by mouth daily.  02/20/18  Yes Lennette Bihari, MD  cyclobenzaprine (FLEXERIL) 10 MG tablet Take 10 mg by mouth 2 (two) times daily as needed for muscle spasms.  08/31/14  Yes [provider]  Fluticasone-Umeclidin-Vilant (TRELEGY ELLIPTA) 100-62.5-25 MCG/INH AEPB Inhale 1 puff into the lungs daily. 06/20/18  Yes Oretha Milch, MD  guaiFENesin-codeine 100-10 MG/5ML syrup Take 5 mLs by mouth every 6 (six) hours as needed for cough.  09/02/18  Yes [provider]  hydrochlorothiazide (MICROZIDE) 12.5 MG capsule Take 1 capsule (12.5 mg total) by mouth every other day. 02/20/18  Yes Lennette Bihari, MD  HYDROcodone-acetaminophen (NORCO) 7.5-325 MG tablet Take 1 tablet by mouth every 6 (six) hours as needed for moderate pain or severe pain.  08/31/18  Yes [provider]  isosorbide mononitrate (IMDUR) 60 MG 24 hr tablet Take 1.5 tablets (90 mg total) by mouth daily. 02/20/18  Yes Lennette Bihari, MD  levofloxacin (LEVAQUIN) 500 MG tablet Take 500 mg by mouth daily.   Yes [provider]  losartan (COZAAR) 25 MG tablet Take 25 mg by mouth every evening.  06/16/18   Yes [provider]  methocarbamol (ROBAXIN) 500 MG tablet Take 500 mg by mouth every 6 (six) hours as needed for muscle spasms.  05/04/18  Yes [provider]  metoprolol succinate (TOPROL-XL) 25 MG 24 hr tablet Take 1 tablet (25 mg total) by mouth daily. 02/20/18  Yes Lennette Bihari, MD  Omega-3 Fatty Acids (FISH OIL) 1200 MG CAPS Take 1,200 mg by mouth 2 (two) times daily.    Yes [provider]  ondansetron (ZOFRAN ODT) 4 MG disintegrating tablet Take 1 tablet (4 mg total) by mouth every 8 (eight) hours as needed. Patient taking differently: Take 4  mg by mouth every 8 (eight) hours as needed for nausea.  09/06/18  Yes Jacalyn Lefevre, MD  SYNTHROID 175 MCG tablet Take 175 mcg by mouth daily before breakfast.  06/26/18  Yes [provider]  levothyroxine (SYNTHROID) 100 MCG tablet Take 1 tablet (100 mcg total) by mouth daily before breakfast. Patient not taking: Reported on 09/06/2018 10/19/16   Lennette Bihari, MD  losartan (COZAAR) 50 MG tablet Take 1.5 tablets (75 mg total) by mouth daily. Patient not taking: Reported on 09/06/2018 02/20/18 02/15/19  Lennette Bihari, MD  umeclidinium-vilanterol [facility] ELLIPTA) 62.5-25 MCG/INH AEPB INHALE 1 PUFF INTO THE LUNGS DAILY.  Needs office visit for refills. Patient not taking: Reported on 09/06/2018 05/09/18   Oretha Milch, MD    The patient is critically ill with multiple organ system failure and requires high complexity decision making for assessment and support, frequent evaluation and titration of therapies, advanced monitoring, review of radiographic studies and interpretation of complex data.   Critical Care Time devoted to patient care services, exclusive of separately billable procedures, described in this note is 35 minutes.   Chilton Greathouse MD [facility] Pager [phone] If no answer call [phone] 09/09/2018, 10:29 AM

## 2018-09-09 NOTE — Progress Notes (Addendum)
PROGRESS NOTE    Matthew Evans  YYT:[id number] DOB: Jul 12, 1950 DOA: 09/08/2018 PCP: Moshe Salisbury, NP   Brief Narrative:68 y.o. male with medical history significant for hypertension, hyperlipidemia, hypothyroidism, coronary artery disease, AAA, COPD, recently diagnosed COVID-19 viral infection, who presented to [facility] ED due to acute onset of shortness of breath with hypoxia.  Onset this morning after waking up.  Patient states he has an pulse oximeter at home.  Checked his oxygen saturation which was 86%.  Became concerned and came to the ED for further evaluation.  Patient was diagnosed with COVID-19 viral infection 4 days ago outpatient and has been self isolating with his wife.  Denies cough, chest pain or any GI symptoms.  States he was feeling febrile this morning and took Tylenol.  TRH asked to admit due to new onset hypoxia in the setting of covid-19 viral infection.  ED Course: Upon presentation to the ED patient hypoxic with O2 saturation 88% on room air, improved on 2 L of oxygen by nasal cannula to 92%.  Chest x-ray independently reviewed revealed worsening bilateral pulmonary infiltrates.  Patient admitted as observation status due to acute hypoxic respiratory failure secondary to COVID-19 viral infection.  Assessment & Plan:   Active Problems:   COVID-19 virus infection   #1 acute hypoxic respiratory failure secondary to COVID-19 viral infection.  At baseline patient does not use oxygen at home.  Today he is needing 9 L high flow nasal cannula saturating 91%.  He also complains of cough.  With his increasing oxygen requirement and profound hypoxia we will transfer patient to stepdown unit under airborne precaution.  Continue MDI use twice a day.  Chest x-ray done 09/08/2018 in comparison with 09/06/2018 shows worsening appearance with evidence of multifocal pneumonia.  I will start him on azithromycin and Plaquenil.  EKG done yesterday QT within normal limits.  I will also add Claritin and  Singulair and 1 dose of Decadron.  His labs are significant for elevated LDH and ferritin QUANTIFERON GOLD ADDED  [facility] TO START ACTEMRA He was tested at [facility] [phone].  #2 hypertension DC HCTZ continue Norvasc continue Toprol  #3 hyperlipidemia  #4 hypothyroidism continue Synthroid  #5 COPD continue MDI  #6 hyponatremia DC hydrochlorothiazide and recheck labs tomorrow.  Will start normal saline 100 cc an hour for 1 L.  #7 CAD continue aspirin and Plavix      Estimated body mass index is 30.2 kg/m as calculated from the following:   Height as of this encounter: 5\' 8"  (1.727 m).   Weight as of this encounter: 90.1 kg.  DVT prophylaxis: Lovenox Code Status: Full code Family Communication: Discussed with wife disposition Plan:pending clinical improvement  Consultants:   None  Procedures: None Antimicrobials: Azithromycin  Subjective: Complaints of cough and shortness of breath  Objective: Vitals:   09/09/18 0809 09/09/18 0823 09/09/18 0824 09/09/18 0825  BP: (!) 143/65     Pulse: 81     Resp: (!) 24     Temp: 100 F (37.8 C)     TempSrc: Oral     SpO2: 90% (!) 86% (!) 88% 91%  Weight:      Height:        Intake/Output Summary (Last 24 hours) at 09/09/2018 0839 Last data filed at 09/09/2018 0823 Gross per 24 hour  Intake 1520 ml  Output 200 ml  Net 1320 ml   Filed Weights   09/08/18 2100  Weight: 90.1 kg  Examination:  General exam: Appears calm and comfortable  Respiratory system: Diminished breath sounds to auscultation. Respiratory effort normal. Cardiovascular system: S1 & S2 heard, RRR. No JVD, murmurs, rubs, gallops or clicks. No pedal edema. Gastrointestinal system: Abdomen is nondistended, soft and nontender. No organomegaly or masses felt. Normal bowel sounds heard. Central nervous system: Alert and oriented. No focal neurological deficits. Extremities: Symmetric 5 x 5 power. Skin: No rashes, lesions or ulcers  Psychiatry: Judgement and insight appear normal. Mood & affect appropriate.     Data Reviewed: I have personally reviewed following labs and imaging studies  CBC: Recent Labs  Lab 09/04/18 2301 09/06/18 1648 09/08/18 1140 09/09/18 0703  WBC 5.8 6.3 7.7 7.7  NEUTROABS 4.4 5.6 6.9 6.8  HGB 15.8 15.7 14.2 12.9*  HCT 48.5 48.6 43.4 41.1  MCV 89.0 89.2 87.9 89.0  PLT 167 171 197 202   Basic Metabolic Panel: Recent Labs  Lab 09/04/18 2301 09/06/18 1648 09/08/18 1140 09/09/18 0703  NA 136 135 135 133*  K 4.4 3.8 3.6 3.7  CL 100 98 102 100  CO2 GLUCOSE 112* 110* 113* 103*  BUN 22 33* 28* 25*  CREATININE 1.33* 1.42* 1.19 1.15  CALCIUM 8.5* 8.1* 7.5* 7.4*   GFR: Estimated Creatinine Clearance: 67 mL/min (by C-G formula based on SCr of 1.15 mg/dL). Liver Function Tests: Recent Labs  Lab 09/06/18 1648 09/08/18 1140  AST 70* 92*  ALT 44 49*  ALKPHOS 81 67  BILITOT 0.8 0.8  PROT 7.4 6.5  ALBUMIN 3.4* 2.9*   No results for input(s): LIPASE, AMYLASE in the last 168 hours. No results for input(s): AMMONIA in the last 168 hours. Coagulation Profile: No results for input(s): INR, PROTIME in the last 168 hours. Cardiac Enzymes: Recent Labs  Lab 09/08/18 1140  TROPONINI 0.05*   BNP (last 3 results) No results for input(s): PROBNP in the last 8760 hours. HbA1C: No results for input(s): HGBA1C in the last 72 hours. CBG: No results for input(s): GLUCAP in the last 168 hours. Lipid Profile: No results for input(s): CHOL, HDL, LDLCALC, TRIG, CHOLHDL, LDLDIRECT in the last 72 hours. Thyroid Function Tests: No results for input(s): TSH, T4TOTAL, FREET4, T3FREE, THYROIDAB in the last 72 hours. Anemia Panel: Recent Labs    09/08/18 1440  FERRITIN 688*   Sepsis Labs: Recent Labs  Lab 09/08/18 1140  LATICACIDVEN 1.7    Recent Results (from the past 240 hour(s))  Culture, blood (routine x 2)     Status: None (Preliminary result)   Collection Time:  09/08/18  2:40 PM  Result Value Ref Range Status   Specimen Description   Final    BLOOD RIGHT WRIST Performed at [facility], [address], [place], [place] [address]    Special Requests   Final    BOTTLES DRAWN AEROBIC AND ANAEROBIC Blood Culture results may not be optimal due to an excessive volume of blood received in culture bottles Performed at [facility], [address], [place], [place] [address]    Culture   Final    NO GROWTH < 12 HOURS Performed at [facility], [address] [place], [place] [address]    Report Status PENDING  Incomplete         Radiology Studies: Dg Chest Port 1 View  Result Date: 09/08/2018 CLINICAL DATA:  68 year old male with a given history of positive SARS-coV2 test, worsening oxygenation EXAM: PORTABLE CHEST 1 VIEW COMPARISON:  09/06/2018 FINDINGS: Cardiomediastinal silhouette unchanged in size and contour. Compared to prior plain film there is worsening of bilateral, left greater than right reticulonodular opacities and developing airspace opacities. No pneumothorax. No pleural effusion. No displaced fracture. IMPRESSION: Worsening appearance of the chest x-ray, with evidence of multifocal pneumonia. Atypical pathogens including viral included in the differential. Electronically Signed   By: Gilmer MorJaime  Wagner D.O.   On: 09/08/2018 15:47        Scheduled Meds: . amLODipine  5 mg Oral Daily  . aspirin EC  81 mg Oral Daily  . clopidogrel  75 mg Oral Daily  . enoxaparin (LOVENOX) injection  40 mg Subcutaneous Q24H  . hydrochlorothiazide  12.5 mg Oral QODAY  . Influenza vac split quadrivalent PF  0.5 mL Intramuscular Tomorrow-1000  . levothyroxine  175 mcg Oral Q0600  . metoprolol succinate  25 mg Oral QHS  . pneumococcal 23 valent vaccine  0.5 mL Intramuscular Tomorrow-1000  . umeclidinium-vilanterol  1 puff Inhalation Daily   Continuous Infusions:   LOS: 0 days     Alwyn RenElizabeth G Mathews, MD  [facility]  If 7PM-7AM, please contact night-coverage [email or web address] Password [email or web address] 09/09/2018, 8:39 AM

## 2018-09-10 LAB — CBC
HCT: 42.6 % (ref 39.0–52.0)
Hemoglobin: 13.7 g/dL (ref 13.0–17.0)
MCH: 28.1 pg (ref 26.0–34.0)
MCHC: 32.2 g/dL (ref 30.0–36.0)
MCV: 87.5 fL (ref 80.0–100.0)
Platelets: 225 10*3/uL (ref 150–400)
RBC: 4.87 MIL/uL (ref 4.22–5.81)
RDW: 13.1 % (ref 11.5–15.5)
WBC: 5.9 10*3/uL (ref 4.0–10.5)
nRBC: 0 % (ref 0.0–0.2)

## 2018-09-10 LAB — BASIC METABOLIC PANEL
Anion gap: 11 (ref 5–15)
BUN: 31 mg/dL — ABNORMAL HIGH (ref 8–23)
CO2: 25 mmol/L (ref 22–32)
Calcium: 7.6 mg/dL — ABNORMAL LOW (ref 8.9–10.3)
Chloride: 98 mmol/L (ref 98–111)
Creatinine, Ser: 1.13 mg/dL (ref 0.61–1.24)
GFR calc Af Amer: >60 mL/min (ref >60)
GFR calc non Af Amer: >60 mL/min (ref >60)
Glucose, Bld: 124 mg/dL — ABNORMAL HIGH (ref 70–99)
Potassium: 4 mmol/L (ref 3.5–5.1)
Sodium: 134 mmol/L — ABNORMAL LOW (ref 135–145)

## 2018-09-10 MED ORDER — ALBUTEROL SULFATE (2.5 MG/3ML) 0.083% IN NEBU
INHALATION_SOLUTION | RESPIRATORY_TRACT | Status: AC
  Filled 2018-09-10: qty 3

## 2018-09-10 MED ORDER — TOCILIZUMAB 400 MG/20ML IV SOLN
400.0000 mg | Freq: Once | INTRAVENOUS | Status: AC
  Administered 2018-09-10: 400 mg via INTRAVENOUS
  Filled 2018-09-10: qty 20

## 2018-09-10 MED ORDER — LORAZEPAM 0.5 MG PO TABS
0.2500 mg | ORAL_TABLET | Freq: Three times a day (TID) | ORAL | Status: DC | PRN
  Administered 2018-09-10 – 2018-09-11 (×2): 0.25 mg via ORAL
  Filled 2018-09-10 (×2): qty 1

## 2018-09-10 NOTE — Progress Notes (Signed)
PROGRESS NOTE    Matthew Evans  HGD:[id number] DOB: 02/17/1951 DOA: 09/08/2018 PCP: Moshe Salisbury, NP   Brief Narrative:68 y.o.malewith medical history significant forhypertension, hyperlipidemia, hypothyroidism, coronary artery disease, AAA, COPD,recently diagnosed COVID-19viralinfection, who presented to [facility] ED due to acute onset of shortness of breath with hypoxia. Onset this morning after waking up. 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 Checked his oxygen saturation which was 86%. Became concerned and came to the ED for further evaluation. Patient was diagnosed with COVID-19 viral infection 4 days ago outpatientand has been self isolating with his wife. Denies cough,chest pain or any GI symptoms. States he was feeling febrile this morningand took Tylenol. TRH asked to admit due to new onset hypoxia in the setting of covid-19 viral infection.  ED Course:Upon presentation to the ED patient hypoxic with O2 saturation 88% on room air, improved on 2 L of oxygen by nasal cannula to 92%. Chest x-ray independently reviewed revealed worsening bilateral pulmonary infiltrates. Patient admitted as observation status due to acute hypoxic respiratory failure secondary to COVID-19 viral infection.  Assessment & Plan:   Active Problems:   COVID-19 virus infection   Hypoxia  #1 acute hypoxic respiratory failure secondary to COVID-19 viral infection.  At baseline patient does not use oxygen at home.    And remains on non-rebreather mask.  Continue MDI.  Continue Plaquenil azithromycin.    #2 hypertension DC HCTZ continue Norvasc continue Toprol  #3 hyperlipidemia continue statin  #4 hypothyroidism continue Synthroid  #5 COPD continue MDI  #6 hyponatremia stable  #7 CAD continue aspirin and Plavix  DVT prophylaxis: Lovenox Code Status DO NOT RESUSCITATE Family Communication: Discussed with wife disposition Plan:pending clinical improvement  Consultants:    None  Procedures: None Antimicrobials: Azithromycin  Estimated body mass index is 30.14 kg/m as calculated from the following:   Height as of this encounter: 5\' 8"  (1.727 m).   Weight as of this encounter: 89.9 kg.    Subjective:  The night staff reports patient became hypoxic to 77% on coughing.  Remains on nonrebreather mask appears in no distress at this time. Objective: Vitals:   09/10/18 0800 09/10/18 0900 09/10/18 0936 09/10/18 1000  BP: 128/62 128/79  131/62  Pulse: 79 80 89 83  Resp: (!) 26 (!) 27 (!) 27 (!) 31  Temp: 98.7 F (37.1 C)     TempSrc: Oral     SpO2: (!) 89% 91% 90% 92%  Weight:      Height:        Intake/Output Summary (Last 24 hours) at 09/10/2018 1058 Last data filed at 09/10/2018 1044 Gross per 24 hour  Intake 460.16 ml  Output 2950 ml  Net -2489.84 ml   Filed Weights   09/08/18 2100 09/09/18 1024  Weight: 90.1 kg 89.9 kg    Examination:  General exam: Appears calm and comfortable  Respiratory system: Coarse breath sounds minimal wheezing to auscultation. Respiratory effort normal. Cardiovascular system: S1 & S2 heard, RRR. No JVD, murmurs, rubs, gallops or clicks. No pedal edema. Gastrointestinal system: Abdomen is nondistended, soft and nontender. No organomegaly or masses felt. Normal bowel sounds heard. Central nervous system: Alert and oriented. No focal neurological deficits. Extremities: Symmetric 5 x 5 power. Skin: No rashes, lesions or ulcers Psychiatry: Judgement and insight appear normal. Mood & affect appropriate.     Data Reviewed: I have personally reviewed following labs and imaging studies  CBC: Recent Labs  Lab 09/04/18 2301 09/06/18 1648 09/08/18 1140 09/09/18 0703 09/10/18  0254  WBC 5.8 6.3 7.7 7.7 5.9  NEUTROABS 4.4 5.6 6.9 6.8  --   HGB 15.8 15.7 14.2 12.9* 13.7  HCT 48.5 48.6 43.4 41.1 42.6  MCV 89.0 89.2 87.9 89.0 87.5  PLT 167 171 197 202 225   Basic Metabolic Panel: Recent Labs  Lab 09/04/18 2301  09/06/18 1648 09/08/18 1140 09/09/18 0703 09/10/18 0254  NA 136 135 135 133* 134*  K 4.4 3.8 3.6 3.7 4.0  CL 100 98 102 100 98  CO2 23 26 24 23 25   GLUCOSE 112* 110* 113* 103* 124*  BUN 22 33* 28* 25* 31*  CREATININE 1.33* 1.42* 1.19 1.15 1.13  CALCIUM 8.5* 8.1* 7.5* 7.4* 7.6*   GFR: Estimated Creatinine Clearance: 68.1 mL/min (by C-G formula based on SCr of 1.13 mg/dL). Liver Function Tests: Recent Labs  Lab 09/06/18 1648 09/08/18 1140  AST 70* 92*  ALT 44 49*  ALKPHOS 81 67  BILITOT 0.8 0.8  PROT 7.4 6.5  ALBUMIN 3.4* 2.9*   No results for input(s): LIPASE, AMYLASE in the last 168 hours. No results for input(s): AMMONIA in the last 168 hours. Coagulation Profile: No results for input(s): INR, PROTIME in the last 168 hours. Cardiac Enzymes: Recent Labs  Lab 09/08/18 1140  TROPONINI 0.05*   BNP (last 3 results) No results for input(s): PROBNP in the last 8760 hours. HbA1C: No results for input(s): HGBA1C in the last 72 hours. CBG: No results for input(s): GLUCAP in the last 168 hours. Lipid Profile: No results for input(s): CHOL, HDL, LDLCALC, TRIG, CHOLHDL, LDLDIRECT in the last 72 hours. Thyroid Function Tests: No results for input(s): TSH, T4TOTAL, FREET4, T3FREE, THYROIDAB in the last 72 hours. Anemia Panel: Recent Labs    09/08/18 1440  FERRITIN 688*   Sepsis Labs: Recent Labs  Lab 09/08/18 1140  LATICACIDVEN 1.7    Recent Results (from the past 240 hour(s))  Culture, blood (routine x 2)     Status: None (Preliminary result)   Collection Time: 09/08/18  2:40 PM  Result Value Ref Range Status   Specimen Description   Final    BLOOD RIGHT WRIST Performed at [facility], [address] [place], [place] [address]    Special Requests   Final    BOTTLES DRAWN AEROBIC AND ANAEROBIC Blood Culture results may not be optimal due to an excessive volume of blood received in culture bottles Performed at [facility], [address] [place], [place] [address]    Culture   Final    NO GROWTH 2 DAYS Performed at [facility], [address] [place], [place] [address]    Report Status PENDING  Incomplete  Culture, blood (routine x 2)     Status: None (Preliminary result)   Collection Time: 09/09/18  7:03 AM  Result Value Ref Range Status   Specimen Description   Final    BLOOD LEFT ARM Performed at [facility], [address] [place], [place] [address]    Special Requests   Final    BOTTLES DRAWN AEROBIC ONLY Blood Culture adequate volume Performed at [facility], [address] [place], [place] [address]    Culture   Final    NO GROWTH < 24 HOURS Performed at [facility], [address] [place], [place] [address]    Report Status PENDING  Incomplete  MRSA PCR Screening     Status: None   Collection Time:  09/09/18 12:00 PM  Result Value Ref Range Status   MRSA by PCR NEGATIVE NEGATIVE Final    Comment:        The GeneXpert MRSA Assay (FDA approved for NASAL specimens only), is one component of a comprehensive MRSA colonization surveillance program. It is not intended to diagnose MRSA infection nor to guide or monitor treatment for MRSA infections. Performed at [facility], [address] [place], [place] [address]          Radiology Studies: Dg Chest Port 1 View  Result Date: 09/08/2018 CLINICAL DATA:  68 year old male with a given history of positive SARS-coV2 test, worsening oxygenation EXAM: PORTABLE CHEST 1 VIEW COMPARISON:  09/06/2018 FINDINGS: Cardiomediastinal silhouette unchanged in size and contour. Compared to prior plain film there is worsening of bilateral, left greater than right reticulonodular opacities and developing airspace opacities. No pneumothorax. No pleural effusion. No displaced fracture. IMPRESSION: Worsening appearance of the chest x-ray, with evidence of multifocal  pneumonia. Atypical pathogens including viral included in the differential. Electronically Signed   By: Gilmer Mor D.O.   On: 09/08/2018 15:47        Scheduled Meds: . albuterol      . amLODipine  5 mg Oral Daily  . aspirin EC  81 mg Oral Daily  . azithromycin  500 mg Oral Daily  . cholecalciferol  1,000 Units Oral Daily  . clopidogrel  75 mg Oral Daily  . enoxaparin (LOVENOX) injection  40 mg Subcutaneous Q24H  . furosemide  40 mg Intravenous Q12H  . hydroxychloroquine  200 mg Oral BID  . Influenza vac split quadrivalent PF  0.5 mL Intramuscular Tomorrow-1000  . levothyroxine  175 mcg Oral Q0600  . loratadine  10 mg Oral Daily  . metoprolol succinate  25 mg Oral QHS  . montelukast  10 mg Oral QHS  . pneumococcal 23 valent vaccine  0.5 mL Intramuscular Tomorrow-1000  . umeclidinium-vilanterol  1 puff Inhalation Daily  . vitamin C  1,000 mg Oral TID  . zinc sulfate  220 mg Oral Daily   Continuous Infusions: . tocilizumab (ACTEMRA) IV       LOS: 1 day     Alwyn Ren, MD [facility]  If 7PM-7AM, please contact night-coverage [email or web address] Password [employer] 09/10/2018, 10:58 AM

## 2018-09-10 NOTE — Progress Notes (Signed)
NAME:  Matthew Evans, MRN:  [id number], DOB:  04-10-1951, LOS: 1 ADMISSION DATE:  09/08/2018, CONSULTATION DATE:  09/09/2018 REFERRING MD:  Towana BadgerE Matthews MD, CHIEF COMPLAINT:  Resp failure, COVID infection  Brief History   68 year old with mild COPD, hypertension, hyperlipidemia coronary artery disease.  Diagnosed with COVID 19 infection as outpatient  Past Medical History  Hypertension, hyperlipidemia, hypothyroidism, coronary artery disease, AAA, COPD  Significant Hospital Events   4/6- Transfer to ICU on 9 lt O2  Consults:  4/6 PCCM  Procedures:    Significant Diagnostic Tests:  Chest x-ray 4/6-worsening bilateral interstitial opacities  Micro Data:  Blood culture 4/5-pending  Antimicrobials:  Azithromycin 4/6  Interim history/subjective:  No acute events Remains on NRB  Objective   Blood pressure 128/79, pulse 89, temperature 98.7 F (37.1 C), temperature source Oral, resp. rate (!) 27, height 5\' 8"  (1.727 m), weight 89.9 kg, SpO2 90 %.        Intake/Output Summary (Last 24 hours) at 09/10/2018 1039 Last data filed at 09/10/2018 0848 Gross per 24 hour  Intake 220.16 ml  Output 2650 ml  Net -2429.84 ml   Filed Weights   09/08/18 2100 09/09/18 1024  Weight: 90.1 kg 89.9 kg   Gen:      No acute distress HEENT:  EOMI, sclera anicteric Neck:     No masses; no thyromegaly Lungs:    Clear to auscultation bilaterally; normal respiratory effort CV:         Regular rate and rhythm; no murmurs Abd:      + bowel sounds; soft, non-tender; no palpable masses, no distension Ext:    No edema; adequate peripheral perfusion Skin:      Warm and dry; no rash Neuro: alert and oriented x 3 Psych: normal mood and affect  Resolved Hospital Problem list     Assessment & Plan:  68 year old with baseline COPD admitted with COVID-19 infection, respiratory failure  ARDS secondary to COVID-19 Close monitoring for intubation needs Chloroquine, azithromycin IL 6 inhibitor second dose  today Encouraged pt to lie prone for as long as possible.  Lasix for diuresis  COPD Continue anoro Albuterol HFA prn He is not wheezing and we would like to avoid steroids in setting of COVID  Goals of care Had a long discussion with patient today. He was able to participate fully. He states that he does not want intubation if the resp status worsens and does not want CPR/Defib in case of cardiac arrest. Code status changed to DNR  Best practice:  Diet: PO diet Pain/Anxiety/Delirium protocol (if indicated): NA VAP protocol (if indicated): NA DVT prophylaxis: Lovenox GI prophylaxis: NA Glucose control: Monitor blood sugars Mobility: Bed Code Status: DNR Family Communication: Patient updated 4/6 Disposition: ICU  Labs   CBC: Recent Labs  Lab 09/04/18 2301 09/06/18 1648 09/08/18 1140 09/09/18 0703 09/10/18 0254  WBC 5.8 6.3 7.7 7.7 5.9  NEUTROABS 4.4 5.6 6.9 6.8  --   HGB 15.8 15.7 14.2 12.9* 13.7  HCT 48.5 48.6 43.4 41.1 42.6  MCV 89.0 89.2 87.9 89.0 87.5  PLT 167 171 197 202 225    Basic Metabolic Panel: Recent Labs  Lab 09/04/18 2301 09/06/18 1648 09/08/18 1140 09/09/18 0703 09/10/18 0254  NA 136 135 135 133* 134*  K 4.4 3.8 3.6 3.7 4.0  CL 100 98 102 100 98  CO2 23 26 24 23 25   GLUCOSE 112* 110* 113* 103* 124*  BUN 22 33* 28* 25* 31*  CREATININE 1.33*  1.42* 1.19 1.15 1.13  CALCIUM 8.5* 8.1* 7.5* 7.4* 7.6*   GFR: Estimated Creatinine Clearance: 68.1 mL/min (by C-G formula based on SCr of 1.13 mg/dL). Recent Labs  Lab 09/06/18 1648 09/08/18 1140 09/09/18 0703 09/10/18 0254  WBC 6.3 7.7 7.7 5.9  LATICACIDVEN  --  1.7  --   --     Liver Function Tests: Recent Labs  Lab 09/06/18 1648 09/08/18 1140  AST 70* 92*  ALT 44 49*  ALKPHOS 81 67  BILITOT 0.8 0.8  PROT 7.4 6.5  ALBUMIN 3.4* 2.9*   No results for input(s): LIPASE, AMYLASE in the last 168 hours. No results for input(s): AMMONIA in the last 168 hours.  ABG    Component Value  Date/Time   TCO2 29 09/28/2016 0959     Coagulation Profile: No results for input(s): INR, PROTIME in the last 168 hours.  Cardiac Enzymes: Recent Labs  Lab 09/08/18 1140  TROPONINI 0.05*    HbA1C: No results found for: HGBA1C  CBG: No results for input(s): GLUCAP in the last 168 hours.  The patient is critically ill with multiple organ system failure and requires high complexity decision making for assessment and support, frequent evaluation and titration of therapies, advanced monitoring, review of radiographic studies and interpretation of complex data.   Critical Care Time devoted to patient care services, exclusive of separately billable procedures, described in this note is 35 minutes.   Chilton Greathouse MD [facility] Pager [phone] If no answer call [phone] 09/10/2018, 10:39 AM

## 2018-09-10 NOTE — Progress Notes (Signed)
E-Link tele visit with wife successful

## 2018-09-10 NOTE — Plan of Care (Signed)
  Problem: Clinical Measurements: Goal: Respiratory complications will improve Outcome: Progressing   Problem: Clinical Measurements: Goal: Cardiovascular complication will be avoided Outcome: Progressing   Problem: Activity: Goal: Risk for activity intolerance will decrease Outcome: Progressing   Problem: Coping: Goal: Level of anxiety will decrease Outcome: Progressing   Problem: Elimination: Goal: Will not experience complications related to bowel motility Outcome: Progressing   

## 2018-09-10 NOTE — Progress Notes (Signed)
Agree with [name]'s assessment. Will continue to monitor.  

## 2018-09-11 LAB — QUANTIFERON-TB GOLD PLUS (RQFGPL)
QuantiFERON Mitogen Value: 0.14 IU/mL
QuantiFERON Nil Value: 0.04 IU/mL
QuantiFERON TB1 Ag Value: 0.04 IU/mL
QuantiFERON TB2 Ag Value: 0.04 IU/mL

## 2018-09-11 LAB — QUANTIFERON-TB GOLD PLUS: QuantiFERON-TB Gold Plus: UNDETERMINED

## 2018-09-11 MED ORDER — LORAZEPAM 0.5 MG PO TABS
0.5000 mg | ORAL_TABLET | Freq: Four times a day (QID) | ORAL | Status: DC | PRN
  Administered 2018-09-11 – 2018-09-19 (×18): 0.5 mg via ORAL
  Filled 2018-09-11 (×20): qty 1

## 2018-09-11 NOTE — Progress Notes (Addendum)
NAME:  Matthew Evans, MRN:  [id number], DOB:  17-Sep-1950, LOS: 2 ADMISSION DATE:  09/08/2018, CONSULTATION DATE:  09/09/2018 REFERRING MD:  Towana BadgerE Matthews MD, CHIEF COMPLAINT:  Resp failure, COVID infection  Brief History   68 year old with mild COPD, hypertension, hyperlipidemia coronary artery disease.  Diagnosed with COVID 19 infection as outpatient  Past Medical History  Hypertension, hyperlipidemia, hypothyroidism, coronary artery disease, AAA, COPD  Significant Hospital Events   4/6- Transfer to ICU on 9 lt O2 4/6 - 4/7 Received Tocilizumab x 2 doses  Consults:  4/6 PCCM  Procedures:    Significant Diagnostic Tests:  Chest x-ray 4/6-worsening bilateral interstitial opacities  Micro Data:  Blood culture 4/5-pending  Antimicrobials:  Azithromycin 4/6  Interim history/subjective:  No acute events Remains on NRB  Objective   Blood pressure 100/70, pulse 100, temperature 98.7 F (37.1 C), temperature source Axillary, resp. rate (!) 34, height 5\' 8"  (1.727 m), weight 86 kg, SpO2 (!) 79 %.        Intake/Output Summary (Last 24 hours) at 09/11/2018 [id number] Last data filed at 09/11/2018 [id number] Gross per 24 hour  Intake 1239.61 ml  Output 2050 ml  Net -810.39 ml   Filed Weights   09/09/18 1024 09/11/18 0336 09/11/18 0355  Weight: 89.9 kg 86 kg 86 kg   Gen:      No acute distress HEENT:  EOMI, sclera anicteric Neck:     No masses; no thyromegaly Lungs:    Clear to auscultation bilaterally; normal respiratory effort CV:         Regular rate and rhythm; no murmurs Abd:      + bowel sounds; soft, non-tender; no palpable masses, no distension Ext:    No edema; adequate peripheral perfusion Skin:      Warm and dry; no rash Neuro: alert and oriented x 3 Psych: normal mood and affect  Resolved Hospital Problem list     Assessment & Plan:  68 year old with baseline COPD admitted with COVID-19 infection, respiratory failure  ARDS secondary to COVID-19 Close monitoring for  intubation needs Chloroquine, azithromycin Encouraged pt to lie prone for as long as possible.  Lasix for diuresis   COPD Continue anoro Albuterol HFA prn  Goals of care Had a long discussion with patient 4/6. He was able to participate fully. He states that he does not want intubation if the resp status worsens and does not want CPR/Defib in case of cardiac arrest. Code status changed to DNR  Best practice:  Diet: PO diet Pain/Anxiety/Delirium protocol (if indicated): NA VAP protocol (if indicated): NA DVT prophylaxis: Lovenox GI prophylaxis: NA Glucose control: Monitor blood sugars Mobility: Bed Code Status: DNR Family Communication: Patient updated 4/6 Disposition: ICU  Labs   CBC: Recent Labs  Lab 09/04/18 2301 09/06/18 1648 09/08/18 1140 09/09/18 0703 09/10/18 0254  WBC 5.8 6.3 7.7 7.7 5.9  NEUTROABS 4.4 5.6 6.9 6.8  --   HGB 15.8 15.7 14.2 12.9* 13.7  HCT 48.5 48.6 43.4 41.1 42.6  MCV 89.0 89.2 87.9 89.0 87.5  PLT 167 171 197 202 225    Basic Metabolic Panel: Recent Labs  Lab 09/04/18 2301 09/06/18 1648 09/08/18 1140 09/09/18 0703 09/10/18 0254  NA 136 135 135 133* 134*  K 4.4 3.8 3.6 3.7 4.0  CL 100 98 102 100 98  CO2 23 26 24 23 25   GLUCOSE 112* 110* 113* 103* 124*  BUN 22 33* 28* 25* 31*  CREATININE 1.33* 1.42* 1.19 1.15 1.13  CALCIUM 8.5*  8.1* 7.5* 7.4* 7.6*   GFR: Estimated Creatinine Clearance: 66.7 mL/min (by C-G formula based on SCr of 1.13 mg/dL). Recent Labs  Lab 09/06/18 1648 09/08/18 1140 09/09/18 0703 09/10/18 0254  WBC 6.3 7.7 7.7 5.9  LATICACIDVEN  --  1.7  --   --     Liver Function Tests: Recent Labs  Lab 09/06/18 1648 09/08/18 1140  AST 70* 92*  ALT 44 49*  ALKPHOS 81 67  BILITOT 0.8 0.8  PROT 7.4 6.5  ALBUMIN 3.4* 2.9*   No results for input(s): LIPASE, AMYLASE in the last 168 hours. No results for input(s): AMMONIA in the last 168 hours.  ABG    Component Value Date/Time   TCO2 29 09/28/2016 0959      Coagulation Profile: No results for input(s): INR, PROTIME in the last 168 hours.  Cardiac Enzymes: Recent Labs  Lab 09/08/18 1140  TROPONINI 0.05*    HbA1C: No results found for: HGBA1C  CBG: No results for input(s): GLUCAP in the last 168 hours.  Chilton Greathouse MD [facility] Pager [phone] If no answer call [phone] 09/11/2018, 9:49 AM

## 2018-09-11 NOTE — Progress Notes (Signed)
Received call from CMD about monitor being off of patient. Walked into room to find that patient had removed all leads and walked to the bathroom without oxygen. Immediately placed pt back on oxygen and cleaned him up and placed pt back in prone position. Bed alarm set.

## 2018-09-11 NOTE — Progress Notes (Addendum)
Nursing staff continues to educate pt on the importance of prone positioning and how it has improved his numbers. When nursing staff enters room they find him to his side. Pt stable overall at this time with no needs. No changes to note overall. Pt remains on 15 liters of high flow 02.  Pt slept prone approximately 9-10 hours. Will continue to monitor.

## 2018-09-11 NOTE — Progress Notes (Signed)
PROGRESS NOTE    Matthew Evans  ZOX:[id number] DOB: [birth year] DOA: 09/08/2018 PCP: Moshe Salisbury, NP   Brief Narrative:68 y.o.malewith medical history significant forhypertension, hyperlipidemia, hypothyroidism, coronary artery disease, AAA, COPD,recently diagnosed COVID-19viralinfection, who presented to [facility] ED due to acute onset of shortness of breath with hypoxia. Onset this morning after waking up. 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 Checked his oxygen saturation which was 86%. Became concerned and came to the ED for further evaluation. Patient was diagnosed with COVID-19 viral infection 4 days ago outpatientand has been self isolating with his wife. Denies cough,chest pain or any GI symptoms. States he was feeling febrile this morningand took Tylenol. TRH asked to admit due to new onset hypoxia in the setting of covid-19 viral infection.  ED Course:Upon presentation to the ED patient hypoxic with O2 saturation 88% on room air, improved on 2 L of oxygen by nasal cannula to 92%. Chest x-ray independently reviewed revealed worsening bilateral pulmonary infiltrates. Patient admitted as observation status due to acute hypoxic respiratory failure secondary to COVID-19 viral infection.  Assessment & Plan:   Active Problems:   COVID-19 virus infection   Hypoxia  #1 acute hypoxic respiratory failure secondary to COVID-19.  Continue Plaquenil and azithromycin and MDI.  He remains on 15 L of oxygen.  Encourage to lie prone.  Staff reports that he is anxious which is probably 1 of the reasons why he does not want prone.  I will increase the Ativan .5 mg every 6.  Appreciate PCCM input.  #2 hypertension stable on Norvasc and Toprol.  #3 hypothyroidism continue Synthroid.  #4 CAD on aspirin and Plavix  #5 hyperlipidemia continue statin.  DVT prophylaxis:Lovenox Code Status DO NOT RESUSCITATE Family Communication:Discussed with wife disposition Plan:pending  clinicalimprovement  Consultants:  PCCM   Estimated body mass index is 28.83 kg/m as calculated from the following:   Height as of this encounter:  (1.727 m).   Weight as of this encounter: 86 kg.   Subjective: Remains on 15 L of oxygen patient is very hesitant to laying in prone oxygen saturation drops with any movement or activity or even laying on his side.   Objective: Vitals:   09/11/18 0700 09/11/18 0916 09/11/18 1000 09/11/18 1100  BP: 138/73 100/70 (!) 145/62 (!) 161/60  Pulse: 77 100 86 (!) 46  Resp: (!) 24 (!) 34 (!) 23 (!) 27  Temp: 98.7 F (37.1 C)     TempSrc: Axillary     SpO2: 95% (!) 79% 95% (!) 83%  Weight:      Height:        Intake/Output Summary (Last 24 hours) at 09/11/2018 1151 Last data filed at 09/11/2018 0937 Gross per 24 hour  Intake 999.61 ml  Output 1750 ml  Net -750.39 ml   Filed Weights   09/09/18 1024 09/11/18 0336 09/11/18 0355  Weight: 89.9 kg 86 kg 86 kg    Examination:  General exam: Appears calm and comfortable  Respiratory system: Coarse breath sounds to auscultation. Respiratory effort normal. Cardiovascular system: S1 & S2 heard, RRR. No JVD, murmurs, rubs, gallops or clicks. No pedal edema. Gastrointestinal system: Abdomen is nondistended, soft and nontender. No organomegaly or masses felt. Normal bowel sounds heard. Central nervous system: Alert and oriented. No focal neurological deficits. Extremities: Symmetric 5 x 5 power. Skin: No rashes, lesions or ulcers Psychiatry: Judgement and insight appear normal. Mood & affect appropriate.     Data Reviewed: I have personally reviewed following labs  and imaging studies  CBC: Recent Labs  Lab 09/04/18 2301 09/06/18 1648 09/08/18 1140 09/09/18 0703 09/10/18 0254  WBC 5.8 6.3 7.7 7.7 5.9  NEUTROABS 4.4 5.6 6.9 6.8  --   HGB 15.8 15.7 14.2 12.9* 13.7  HCT 48.5 48.6 43.4 41.1 42.6  MCV 89.0 89.2 87.9 89.0 87.5  PLT 167 171 197 202 225   Basic Metabolic Panel:  Recent Labs  Lab 09/04/18 2301 09/06/18 1648 09/08/18 1140 09/09/18 0703 09/10/18 0254  NA 136 135 135 133* 134*  K 4.4 3.8 3.6 3.7 4.0  CL 100 98 102 100 98  CO2 23 26 24 23 25   GLUCOSE 112* 110* 113* 103* 124*  BUN 22 33* 28* 25* 31*  CREATININE 1.33* 1.42* 1.19 1.15 1.13  CALCIUM 8.5* 8.1* 7.5* 7.4* 7.6*   GFR: Estimated Creatinine Clearance: 66.7 mL/min (by C-G formula based on SCr of 1.13 mg/dL). Liver Function Tests: Recent Labs  Lab 09/06/18 1648 09/08/18 1140  AST 70* 92*  ALT 44 49*  ALKPHOS 81 67  BILITOT 0.8 0.8  PROT 7.4 6.5  ALBUMIN 3.4* 2.9*   No results for input(s): LIPASE, AMYLASE in the last 168 hours. No results for input(s): AMMONIA in the last 168 hours. Coagulation Profile: No results for input(s): INR, PROTIME in the last 168 hours. Cardiac Enzymes: Recent Labs  Lab 09/08/18 1140  TROPONINI 0.05*   BNP (last 3 results) No results for input(s): PROBNP in the last 8760 hours. HbA1C: No results for input(s): HGBA1C in the last 72 hours. CBG: No results for input(s): GLUCAP in the last 168 hours. Lipid Profile: No results for input(s): CHOL, HDL, LDLCALC, TRIG, CHOLHDL, LDLDIRECT in the last 72 hours. Thyroid Function Tests: No results for input(s): TSH, T4TOTAL, FREET4, T3FREE, THYROIDAB in the last 72 hours. Anemia Panel: Recent Labs    09/08/18 1440  FERRITIN 688*   Sepsis Labs: Recent Labs  Lab 09/08/18 1140  LATICACIDVEN 1.7    Recent Results (from the past 240 hour(s))  Culture, blood (routine x 2)     Status: None (Preliminary result)   Collection Time: 09/08/18  2:40 PM  Result Value Ref Range Status   Specimen Description   Final    BLOOD RIGHT WRIST Performed at [facility], [address] [place], [place] [address]    Special Requests   Final    BOTTLES DRAWN AEROBIC AND ANAEROBIC Blood Culture results may not be optimal due to an excessive volume of blood received in culture bottles Performed  at [facility], [address] [place], [place] [address]    Culture   Final    NO GROWTH 3 DAYS Performed at [facility], [address], [place], [place] [address]    Report Status PENDING  Incomplete  Culture, blood (routine x 2)     Status: None (Preliminary result)   Collection Time: 09/09/18  7:03 AM  Result Value Ref Range Status   Specimen Description   Final    BLOOD LEFT ARM Performed at [facility] [facility], [address], [place], [place] [address]    Special Requests   Final    BOTTLES DRAWN AEROBIC ONLY Blood Culture adequate volume Performed at [facility], [address] [place], [place] [address]    Culture   Final    NO GROWTH 2 DAYS Performed at [facility], [address], [place], [place] [address]    Report Status  PENDING  Incomplete  MRSA PCR Screening     Status: None   Collection Time: 09/09/18 12:00 PM  Result Value Ref Range Status   MRSA by PCR NEGATIVE NEGATIVE Final    Comment:        The GeneXpert MRSA Assay (FDA approved for NASAL specimens only), is one component of a comprehensive MRSA colonization surveillance program. It is not intended to diagnose MRSA infection nor to guide or monitor treatment for MRSA infections. Performed at [facility], [address] [place], [place] [address]          Radiology Studies: No results found.      Scheduled Meds: . amLODipine  5 mg Oral Daily  . aspirin EC  81 mg Oral Daily  . azithromycin  500 mg Oral Daily  . cholecalciferol  1,000 Units Oral Daily  . clopidogrel  75 mg Oral Daily  . enoxaparin (LOVENOX) injection  40 mg Subcutaneous Q24H  . furosemide  40 mg Intravenous Q12H  . hydroxychloroquine  200 mg Oral BID  . Influenza vac split quadrivalent PF  0.5 mL Intramuscular Tomorrow-1000  . levothyroxine  175 mcg Oral Q0600  . loratadine  10 mg Oral Daily  . metoprolol succinate  25 mg Oral  QHS  . montelukast  10 mg Oral QHS  . pneumococcal 23 valent vaccine  0.5 mL Intramuscular Tomorrow-1000  . umeclidinium-vilanterol  1 puff Inhalation Daily  . vitamin C  1,000 mg Oral TID  . zinc sulfate  220 mg Oral Daily   Continuous Infusions:   LOS: 2 days     Alwyn RenElizabeth G Beldon Nowling, MD [facility]  If 7PM-7AM, please contact night-coverage [email or web address] Password [email or web address] 09/11/2018, 11:51 AM

## 2018-09-12 DIAGNOSIS — J810 Acute pulmonary edema: Secondary | ICD-10-CM

## 2018-09-12 DIAGNOSIS — R0902 Hypoxemia: Secondary | ICD-10-CM

## 2018-09-12 DIAGNOSIS — Z7189 Other specified counseling: Secondary | ICD-10-CM

## 2018-09-12 LAB — BASIC METABOLIC PANEL
Anion gap: 12 (ref 5–15)
BUN: 43 mg/dL — ABNORMAL HIGH (ref 8–23)
CO2: 27 mmol/L (ref 22–32)
Calcium: 7.8 mg/dL — ABNORMAL LOW (ref 8.9–10.3)
Chloride: 93 mmol/L — ABNORMAL LOW (ref 98–111)
Creatinine, Ser: 1.31 mg/dL — ABNORMAL HIGH (ref 0.61–1.24)
GFR calc Af Amer: >60 mL/min (ref >60)
GFR calc non Af Amer: 56 mL/min — ABNORMAL LOW (ref >60)
Glucose, Bld: 112 mg/dL — ABNORMAL HIGH (ref 70–99)
Potassium: 3.7 mmol/L (ref 3.5–5.1)
Sodium: 132 mmol/L — ABNORMAL LOW (ref 135–145)

## 2018-09-12 LAB — CBC
HCT: 46.3 % (ref 39.0–52.0)
Hemoglobin: 14.9 g/dL (ref 13.0–17.0)
MCH: 28.4 pg (ref 26.0–34.0)
MCHC: 32.2 g/dL (ref 30.0–36.0)
MCV: 88.2 fL (ref 80.0–100.0)
Platelets: 404 10*3/uL — ABNORMAL HIGH (ref 150–400)
RBC: 5.25 MIL/uL (ref 4.22–5.81)
RDW: 13.2 % (ref 11.5–15.5)
WBC: 7 10*3/uL (ref 4.0–10.5)
nRBC: 0 % (ref 0.0–0.2)

## 2018-09-12 LAB — MAGNESIUM: Magnesium: 2.6 mg/dL — ABNORMAL HIGH (ref 1.7–2.4)

## 2018-09-12 MED ORDER — POTASSIUM CHLORIDE CRYS ER 20 MEQ PO TBCR
40.0000 meq | EXTENDED_RELEASE_TABLET | Freq: Once | ORAL | Status: AC
  Administered 2018-09-12: 40 meq via ORAL

## 2018-09-12 MED ORDER — CHLORHEXIDINE GLUCONATE CLOTH 2 % EX PADS
6.0000 | MEDICATED_PAD | Freq: Every day | CUTANEOUS | Status: DC
  Administered 2018-09-13 – 2018-09-20 (×8): 6 via TOPICAL

## 2018-09-12 MED ORDER — FUROSEMIDE 10 MG/ML IJ SOLN
20.0000 mg | Freq: Every day | INTRAMUSCULAR | Status: DC
  Administered 2018-09-13 – 2018-09-20 (×8): 20 mg via INTRAVENOUS
  Filled 2018-09-12 (×8): qty 2

## 2018-09-12 MED ORDER — POTASSIUM CHLORIDE CRYS ER 20 MEQ PO TBCR
40.0000 meq | EXTENDED_RELEASE_TABLET | Freq: Three times a day (TID) | ORAL | Status: AC
  Administered 2018-09-12 (×2): 40 meq via ORAL
  Filled 2018-09-12 (×2): qty 2

## 2018-09-12 MED ORDER — FUROSEMIDE 10 MG/ML IJ SOLN
40.0000 mg | Freq: Four times a day (QID) | INTRAMUSCULAR | Status: AC
  Administered 2018-09-12 (×2): 40 mg via INTRAVENOUS
  Filled 2018-09-12 (×2): qty 4

## 2018-09-12 MED ORDER — ALBUTEROL SULFATE HFA 108 (90 BASE) MCG/ACT IN AERS
1.0000 | INHALATION_SPRAY | Freq: Four times a day (QID) | RESPIRATORY_TRACT | Status: DC
  Administered 2018-09-12: 2 via RESPIRATORY_TRACT
  Administered 2018-09-12: 16:00:00 1 via RESPIRATORY_TRACT
  Administered 2018-09-12 – 2018-09-13 (×3): 2 via RESPIRATORY_TRACT
  Administered 2018-09-13: 1 via RESPIRATORY_TRACT
  Administered 2018-09-13 – 2018-09-14 (×3): 2 via RESPIRATORY_TRACT
  Administered 2018-09-14: 1 via RESPIRATORY_TRACT
  Administered 2018-09-14 – 2018-09-16 (×9): 2 via RESPIRATORY_TRACT
  Administered 2018-09-17: 17:00:00 1 via RESPIRATORY_TRACT
  Administered 2018-09-17: 2 via RESPIRATORY_TRACT
  Administered 2018-09-17: 1 via RESPIRATORY_TRACT
  Administered 2018-09-17 – 2018-09-18 (×2): 2 via RESPIRATORY_TRACT
  Administered 2018-09-18: 05:00:00 1 via RESPIRATORY_TRACT
  Administered 2018-09-18 – 2018-09-19 (×4): 2 via RESPIRATORY_TRACT
  Administered 2018-09-19 (×2): 1 via RESPIRATORY_TRACT
  Filled 2018-09-12: qty 6.7

## 2018-09-12 MED ORDER — ORAL CARE MOUTH RINSE
15.0000 mL | Freq: Two times a day (BID) | OROMUCOSAL | Status: DC
  Administered 2018-09-12 – 2018-09-20 (×16): 15 mL via OROMUCOSAL

## 2018-09-12 MED ORDER — POTASSIUM CHLORIDE CRYS ER 20 MEQ PO TBCR
EXTENDED_RELEASE_TABLET | ORAL | Status: AC
  Administered 2018-09-12: 40 meq via ORAL
  Filled 2018-09-12: qty 2

## 2018-09-12 NOTE — Progress Notes (Signed)
NAME:  Matthew Evans, MRN:  [id number], DOB:  1950-11-19, LOS: 3 ADMISSION DATE:  09/08/2018, CONSULTATION DATE:  09/09/2018 REFERRING MD:  Towana BadgerE Matthews MD, CHIEF COMPLAINT:  Resp failure, COVID infection  Brief History   68 year old with mild COPD, hypertension, hyperlipidemia coronary artery disease.  Diagnosed with COVID 19 infection as outpatient  Past Medical History  Hypertension, hyperlipidemia, hypothyroidism, coronary artery disease, AAA, COPD  Significant Hospital Events   4/6- Transfer to ICU on 9 lt O2 4/6 - 4/7 Received Tocilizumab x 2 doses  Consults:  4/6 PCCM  Procedures:    Significant Diagnostic Tests:  Chest x-ray 4/6-worsening bilateral interstitial opacities  Micro Data:  Blood culture 4/5-pending  Antimicrobials:  Azithromycin 4/6  Interim history/subjective:  Hypoxemia overnight, increased to 15L HFNC  Objective   Blood pressure 121/61, pulse (!) 57, temperature 97.6 F (36.4 C), temperature source Oral, resp. rate (!) 27, height 5\' 8"  (1.727 m), weight 85.6 kg, SpO2 100 %.        Intake/Output Summary (Last 24 hours) at 09/12/2018 1157 Last data filed at 09/12/2018 0040 Gross per 24 hour  Intake -  Output 625 ml  Net -625 ml   Filed Weights   09/11/18 0336 09/11/18 0355 09/12/18 0300  Weight: 86 kg 86 kg 85.6 kg   Gen:      Chronically ill appearing, NAD HEENT:  [place]/AT, PERRL, EOM-I and MMM Neck:      Supple, no masses Lungs:    Bibasilar crackles on exam CV:         RRR, Nl S1/S2 and -M/R/G Abd:      Soft, NT, ND and +BS Ext:    No edema; adequate peripheral perfusion Skin:       Warm and dry; no rash Neuro:    Alert and interactive, moving all ext to command Psych:    normal mood and affect  Resolved Hospital Problem list     Assessment & Plan:  68 year old with baseline COPD admitted with COVID-19 infection, respiratory failure  ARDS secondary to COVID-19 Continue close monitoring for intubation need, recommend keeping in the ICU for  now Chloroquine, azithromycin, finish a 5 day course Encouraged pt to lie prone for as long as possible.  Lasix for diuresis, 40 mg IV q8 x2 doses  COPD Continue anoro Albuterol HFA prn  Hypokalemia Replace BMET in AM  Acute pulmonary edema Lasix 40 mg IV q8 x2 doses  Goals of care Had a long discussion with patient, he now wishes to be back to full code status but does not need to intubated right now.  Best practice:  Diet: PO diet Pain/Anxiety/Delirium protocol (if indicated): NA VAP protocol (if indicated): NA DVT prophylaxis: Lovenox GI prophylaxis: NA Glucose control: Monitor blood sugars Mobility: Bed Code Status: DNR Family Communication: Patient updated 4/6 Disposition: ICU  Labs   CBC: Recent Labs  Lab 09/06/18 1648 09/08/18 1140 09/09/18 0703 09/10/18 0254 09/12/18 0259  WBC 6.3 7.7 7.7 5.9 7.0  NEUTROABS 5.6 6.9 6.8  --   --   HGB 15.7 14.2 12.9* 13.7 14.9  HCT 48.6 43.4 41.1 42.6 46.3  MCV 89.2 87.9 89.0 87.5 88.2  PLT 171 197 202 225 404*    Basic Metabolic Panel: Recent Labs  Lab 09/06/18 1648 09/08/18 1140 09/09/18 0703 09/10/18 0254 09/12/18 0259  NA 135 135 133* 134* 132*  K 3.8 3.6 3.7 4.0 3.7  CL 98 102 100 98 93*  CO2 26 24 23 25  27  GLUCOSE 110* 113* 103* 124* 112*  BUN 33* 28* 25* 31* 43*  CREATININE 1.42* 1.19 1.15 1.13 1.31*  CALCIUM 8.1* 7.5* 7.4* 7.6* 7.8*  MG  --   --   --   --  2.6*   GFR: Estimated Creatinine Clearance: 57.5 mL/min (A) (by C-G formula based on SCr of 1.31 mg/dL (H)). Recent Labs  Lab 09/08/18 1140 09/09/18 0703 09/10/18 0254 09/12/18 0259  WBC 7.7 7.7 5.9 7.0  LATICACIDVEN 1.7  --   --   --     Liver Function Tests: Recent Labs  Lab 09/06/18 1648 09/08/18 1140  AST 70* 92*  ALT 44 49*  ALKPHOS 81 67  BILITOT 0.8 0.8  PROT 7.4 6.5  ALBUMIN 3.4* 2.9*   No results for input(s): LIPASE, AMYLASE in the last 168 hours. No results for input(s): AMMONIA in the last 168 hours.  ABG     Component Value Date/Time   TCO2 29 09/28/2016 0959     Coagulation Profile: No results for input(s): INR, PROTIME in the last 168 hours.  Cardiac Enzymes: Recent Labs  Lab 09/08/18 1140  TROPONINI 0.05*    HbA1C: No results found for: HGBA1C  CBG: No results for input(s): GLUCAP in the last 168 hours.  Discussed with PCCM-NP  Hold in the ICU given high O2 demand  PCCM will be available if patient deteriorates, please call back if needed.  The patient is critically ill with multiple organ systems failure and requires high complexity decision making for assessment and support, frequent evaluation and titration of therapies, application of advanced monitoring technologies and extensive interpretation of multiple databases.   Critical Care Time devoted to patient care services described in this note is  32  Minutes. This time reflects time of care of this signee Dr Koren Bound. This critical care time does not reflect procedure time, or teaching time or supervisory time of PA/NP/Med student/Med Resident etc but could involve care discussion time.  Alyson Reedy, M.D. [facility] Pulmonary/Critical Care Medicine. Pager: [phone]. After hours pager: [phone].  09/12/2018, 11:57 AM

## 2018-09-12 NOTE — Progress Notes (Signed)
Bed alarm went off. Quickly placed proper PPE and went in room. Patient was sitting on side of bed with O2 off, O2 finger probe off and trying to take off gown. Quickly placed O2 back on with finger probe and gown. Asked patient what was he doing. He stating that he was getting OOB. Assessed  Orientation. He was A&O x4. I asked him if he forgot that he wasn't supposed to get OOB with assist. He stated that he did forget and that he woke up after some crazy dreams. I told him that he scared me by trying to get up without assistance and without oxygen on. He stated that he scared himself. Assisted him back in the prone position. He stated that he was ready to go back to sleep. Placed more sensitive setting of the bed alarm on and re-educated on not getting OOB without assist. Placed call bell within reach.

## 2018-09-12 NOTE — Progress Notes (Signed)
PROGRESS NOTE    WOODFORD STREGE  WJX:[id number] DOB: December 07, 1950 DOA: 09/08/2018 PCP: Moshe Salisbury, NP   Brief Narrative:68 y.o.malewith medical history significant forhypertension, hyperlipidemia, hypothyroidism, coronary artery disease, AAA, COPD,recently diagnosed COVID-19viralinfection, who presented to [facility] ED due to acute onset of shortness of breath with hypoxia. Onset this morning after waking up. 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 Checked his oxygen saturation which was 86%. Became concerned and came to the ED for further evaluation. Patient was diagnosed with COVID-19 viral infection 4 days ago outpatientand has been self isolating with his wife. Denies cough,chest pain or any GI symptoms. States he was feeling febrile this morningand took Tylenol. TRH asked to admit due to new onset hypoxia in the setting of covid-19 viral infection.  ED Course:Upon presentation to the ED patient hypoxic with O2 saturation 88% on room air, improved on 2 L of oxygen by nasal cannula to 92%. Chest x-ray independently reviewed revealed worsening bilateral pulmonary infiltrates. Patient admitted as observation status due to acute hypoxic respiratory failure secondary to COVID-19 viral infection.  Assessment & Plan:   Active Problems:   COVID-19 virus infection   Hypoxia  #1 acute hypoxic respiratory failure secondary to COVID-19.  Continue Plaquenil and azithromycin and MDI.  He remains on 15 L of oxygen.  Encourage to lie prone.    Potassium 3.7 give 1 dose of K. Dur.  Magnesium normal.  QT prolonged than yesterday.  Changed MDI to 4 times a day.  Patient is able to use inhaler by himself.  #2 hypertension blood pressure soft DC Norvasc Toprol continue Lasix at lower dose  #3 hypothyroidism continue Synthroid.  #4 CAD on aspirin and Plavix  #5 hyperlipidemia continue statin.  DVT prophylaxis:Lovenox Code StatusDO NOT RESUSCITATE Family  Communication:Discussed with wife disposition Plan:pending clinicalimprovement  Consultants:  PCCM       Estimated body mass index is 28.69 kg/m as calculated from the following:   Height as of this encounter:  (1.727 m).   Weight as of this encounter: 85.6 kg.   Subjective: Night staff reports he got up and took off his oxygen, then he slept on his belly all night.  He is on 15 L of oxygen.  Blood pressure soft this morning.  Objective: Vitals:   09/12/18 0500 09/12/18 0524 09/12/18 0600 09/12/18 0620  BP: (!) 98/49 113/85 95/65   Pulse: 68 71 66 67  Resp: (!) 21 19 (!) 33 20  Temp:      TempSrc:      SpO2:  97% 98% 94%  Weight:      Height:        Intake/Output Summary (Last 24 hours) at 09/12/2018 0920 Last data filed at 09/12/2018 0040 Gross per 24 hour  Intake -  Output 725 ml  Net -725 ml   Filed Weights   09/11/18 0336 09/11/18 0355 09/12/18 0300  Weight: 86 kg 86 kg 85.6 kg    Examination:  General exam: Appears calm and comfortable  Respiratory system: Rhonchi and wheezing to auscultation. Respiratory effort normal. Cardiovascular system: S1 & S2 heard, RRR. No JVD, murmurs, rubs, gallops or clicks. No pedal edema. Gastrointestinal system: Abdomen is nondistended, soft and nontender. No organomegaly or masses felt. Normal bowel sounds heard. Central nervous system: Alert and oriented. No focal neurological deficits. Extremities: Symmetric 5 x 5 power. Skin: No rashes, lesions or ulcers Psychiatry: Judgement and insight appear normal. Mood & affect appropriate.     Data Reviewed: I  have personally reviewed following labs and imaging studies  CBC: Recent Labs  Lab 09/06/18 1648 09/08/18 1140 09/09/18 0703 09/10/18 0254 09/12/18 0259  WBC 6.3 7.7 7.7 5.9 7.0  NEUTROABS 5.6 6.9 6.8  --   --   HGB 15.7 14.2 12.9* 13.7 14.9  HCT 48.6 43.4 41.1 42.6 46.3  MCV 89.2 87.9 89.0 87.5 88.2  PLT 171 197 202 225 404*   Basic Metabolic Panel:  Recent Labs  Lab 09/06/18 1648 09/08/18 1140 09/09/18 0703 09/10/18 0254 09/12/18 0259  NA 135 135 133* 134* 132*  K 3.8 3.6 3.7 4.0 3.7  CL 98 102 100 98 93*  CO2 26 24 23 25 27   GLUCOSE 110* 113* 103* 124* 112*  BUN 33* 28* 25* 31* 43*  CREATININE 1.42* 1.19 1.15 1.13 1.31*  CALCIUM 8.1* 7.5* 7.4* 7.6* 7.8*  MG  --   --   --   --  2.6*   GFR: Estimated Creatinine Clearance: 57.5 mL/min (A) (by C-G formula based on SCr of 1.31 mg/dL (H)). Liver Function Tests: Recent Labs  Lab 09/06/18 1648 09/08/18 1140  AST 70* 92*  ALT 44 49*  ALKPHOS 81 67  BILITOT 0.8 0.8  PROT 7.4 6.5  ALBUMIN 3.4* 2.9*   No results for input(s): LIPASE, AMYLASE in the last 168 hours. No results for input(s): AMMONIA in the last 168 hours. Coagulation Profile: No results for input(s): INR, PROTIME in the last 168 hours. Cardiac Enzymes: Recent Labs  Lab 09/08/18 1140  TROPONINI 0.05*   BNP (last 3 results) No results for input(s): PROBNP in the last 8760 hours. HbA1C: No results for input(s): HGBA1C in the last 72 hours. CBG: No results for input(s): GLUCAP in the last 168 hours. Lipid Profile: No results for input(s): CHOL, HDL, LDLCALC, TRIG, CHOLHDL, LDLDIRECT in the last 72 hours. Thyroid Function Tests: No results for input(s): TSH, T4TOTAL, FREET4, T3FREE, THYROIDAB in the last 72 hours. Anemia Panel: No results for input(s): VITAMINB12, FOLATE, FERRITIN, TIBC, IRON, RETICCTPCT in the last 72 hours. Sepsis Labs: Recent Labs  Lab 09/08/18 1140  LATICACIDVEN 1.7    Recent Results (from the past 240 hour(s))  Culture, blood (routine x 2)     Status: None (Preliminary result)   Collection Time: 09/08/18  2:40 PM  Result Value Ref Range Status   Specimen Description   Final    BLOOD RIGHT WRIST Performed at [facility], [address], [place], [place] [address]    Special Requests   Final    BOTTLES DRAWN AEROBIC AND ANAEROBIC Blood Culture results may  not be optimal due to an excessive volume of blood received in culture bottles Performed at [facility], [address], [place], [place] [address]    Culture   Final    NO GROWTH 4 DAYS Performed at [facility], [address], [place], [place] [address]    Report Status PENDING  Incomplete  Culture, blood (routine x 2)     Status: None (Preliminary result)   Collection Time: 09/09/18  7:03 AM  Result Value Ref Range Status   Specimen Description   Final    BLOOD LEFT ARM Performed at [facility], [address] [place], [place] [address]    Special Requests   Final    BOTTLES DRAWN AEROBIC ONLY Blood Culture adequate volume Performed at [facility], [address] [place], [place] [address]    Culture  Final    NO GROWTH 3 DAYS Performed at [facility], [address] [place], [place] [address]    Report Status PENDING  Incomplete  MRSA PCR Screening     Status: None   Collection Time: 09/09/18 12:00 PM  Result Value Ref Range Status   MRSA by PCR NEGATIVE NEGATIVE Final    Comment:        The GeneXpert MRSA Assay (FDA approved for NASAL specimens only), is one component of a comprehensive MRSA colonization surveillance program. It is not intended to diagnose MRSA infection nor to guide or monitor treatment for MRSA infections. Performed at [facility], [address] [place], [place] [address]          Radiology Studies: No results found.      Scheduled Meds: . amLODipine  5 mg Oral Daily  . aspirin EC  81 mg Oral Daily  . azithromycin  500 mg Oral Daily  . Chlorhexidine Gluconate Cloth  6 each Topical Daily  . cholecalciferol  1,000 Units Oral Daily  . clopidogrel  75 mg Oral Daily  . enoxaparin (LOVENOX) injection  40 mg Subcutaneous Q24H  . furosemide  40 mg Intravenous Q12H  . hydroxychloroquine  200 mg Oral BID  . Influenza vac split quadrivalent PF   0.5 mL Intramuscular Tomorrow-1000  . levothyroxine  175 mcg Oral Q0600  . loratadine  10 mg Oral Daily  . mouth rinse  15 mL Mouth Rinse BID  . metoprolol succinate  25 mg Oral QHS  . montelukast  10 mg Oral QHS  . pneumococcal 23 valent vaccine  0.5 mL Intramuscular Tomorrow-1000  . umeclidinium-vilanterol  1 puff Inhalation Daily  . vitamin C  1,000 mg Oral TID  . zinc sulfate  220 mg Oral Daily   Continuous Infusions:   LOS: 3 days     Alwyn RenElizabeth G Mathews, MD [facility]  If 7PM-7AM, please contact night-coverage [email or web address] Password [email or web address] 09/12/2018, 9:20 AM

## 2018-09-13 ENCOUNTER — Inpatient Hospital Stay (HOSPITAL_COMMUNITY): Payer: BLUE CROSS/BLUE SHIELD

## 2018-09-13 DIAGNOSIS — J9601 Acute respiratory failure with hypoxia: Secondary | ICD-10-CM

## 2018-09-13 LAB — BASIC METABOLIC PANEL
Anion gap: 12 (ref 5–15)
BUN: 40 mg/dL — ABNORMAL HIGH (ref 8–23)
CO2: 26 mmol/L (ref 22–32)
Calcium: 8.2 mg/dL — ABNORMAL LOW (ref 8.9–10.3)
Chloride: 96 mmol/L — ABNORMAL LOW (ref 98–111)
Creatinine, Ser: 1.27 mg/dL — ABNORMAL HIGH (ref 0.61–1.24)
GFR calc Af Amer: >60 mL/min (ref >60)
GFR calc non Af Amer: 58 mL/min — ABNORMAL LOW (ref >60)
Glucose, Bld: 99 mg/dL (ref 70–99)
Potassium: 4.7 mmol/L (ref 3.5–5.1)
Sodium: 134 mmol/L — ABNORMAL LOW (ref 135–145)

## 2018-09-13 LAB — CULTURE, BLOOD (ROUTINE X 2): Culture: NO GROWTH

## 2018-09-13 LAB — CBC
HCT: 49.9 % (ref 39.0–52.0)
Hemoglobin: 16.2 g/dL (ref 13.0–17.0)
MCH: 28.8 pg (ref 26.0–34.0)
MCHC: 32.5 g/dL (ref 30.0–36.0)
MCV: 88.6 fL (ref 80.0–100.0)
Platelets: 443 10*3/uL — ABNORMAL HIGH (ref 150–400)
RBC: 5.63 MIL/uL (ref 4.22–5.81)
RDW: 13.2 % (ref 11.5–15.5)
WBC: 8.8 10*3/uL (ref 4.0–10.5)
nRBC: 0.2 % (ref 0.0–0.2)

## 2018-09-13 LAB — MAGNESIUM: Magnesium: 2.5 mg/dL — ABNORMAL HIGH (ref 1.7–2.4)

## 2018-09-13 LAB — PHOSPHORUS: Phosphorus: 2 mg/dL — ABNORMAL LOW (ref 2.5–4.6)

## 2018-09-13 MED ORDER — SODIUM PHOSPHATES 45 MMOLE/15ML IV SOLN
20.0000 mmol | Freq: Once | INTRAVENOUS | Status: AC
  Administered 2018-09-13: 17:00:00 20 mmol via INTRAVENOUS
  Filled 2018-09-13: qty 6.67

## 2018-09-13 MED ORDER — FUROSEMIDE 10 MG/ML IJ SOLN: 40.0000 mg | Freq: Once | INTRAMUSCULAR | Status: DC

## 2018-09-13 MED ORDER — FUROSEMIDE 10 MG/ML IJ SOLN
40.0000 mg | Freq: Once | INTRAMUSCULAR | Status: AC
  Administered 2018-09-13: 40 mg via INTRAVENOUS

## 2018-09-13 NOTE — Progress Notes (Addendum)
PROGRESS NOTE    Matthew Evans  ZOX:[id number] DOB: Mar 24, 1951 DOA: 09/08/2018 PCP: Moshe SalisburyKendall, Lonnie, NP   Brief Lanelle BalNarrative68 y.o.malewith medical history significant forhypertension, hyperlipidemia, hypothyroidism, coronary artery disease, AAA, COPD,recently diagnosed COVID-19viralinfection, who [facility] due to acute onset of shortness of breath with hypoxia. Onset this morning after waking up. 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 Checked his oxygen saturation which was 86%. Became concerned and came to the ED for further evaluation. Patient was diagnosed with COVID-19 viral infection 4 days ago outpatientand has been self isolating with his wife. Denies cough,chest pain or any GI symptoms. States he was feeling febrile this morningand took Tylenol. TRH asked to admit due to new onset hypoxia in the setting of covid-19 viral infection.  ED Course:Upon presentation to the ED patient hypoxic with O2 saturation 88% on room air, improved on 2 L of oxygen by nasal cannula to 92%. Chest x-ray independently reviewed revealed worsening bilateral pulmonary infiltrates. Patient admitted as observation status due to acute hypoxic respiratory failure secondary to COVID-19 viral infection.   Assessment & Plan:   Active Problems:   COVID-19 virus infection   Hypoxia   Goals of care, counseling/discussion   Acute pulmonary edema (HCC)  #1 acute hypoxic respiratory failure secondary to COVID-19. Continue Plaquenil and azithromycin and MDI. He remains on 15 L of oxygen. Encourage to lie prone.   Potassium 4.7 .  Magnesium 2.5.  QT prolonged than yesterday.  Changed MDI to 4 times a day.  Patient is able to use inhaler by himself.  Patient saturation anywhere from 87 to 92% on 15 L of oxygen.  Patient is very adamant about proning  and wants to lay on his side most of the time.  #2 hypertension blood pressure soft DC Norvasc Toprol continue Lasix at lower dose  #3  hypothyroidism continue Synthroid.  #4 CAD on aspirin and Plavix  #5 hyperlipidemia continue statin.  DVT prophylaxis:Lovenox Code StatusDO NOT RESUSCITATE Family Communication:Discussed with wife disposition Plan:pending clinicalimprovement  Consultants:  PCCM    Estimated body mass index is 28.02 kg/m as calculated from the following:   Height as of this encounter: 5\' 8"  (1.727 m).   Weight as of this encounter: 83.6 kg.    Subjective: Resting in bed in no acute distress he reports that he is better still having some cough and shortness of breath.  Objective: Vitals:   09/13/18 0600 09/13/18 0608 09/13/18 0642 09/13/18 0700  BP:  107/68  (!) 104/48  Pulse: 90 86 82 76  Resp: 17 (!) 27 20 (!) 26  Temp:      TempSrc:      SpO2: 95% 95% 91% 95%  Weight:      Height:        Intake/Output Summary (Last 24 hours) at 09/13/2018 1113 Last data filed at 09/13/2018 0458 Gross per 24 hour  Intake 480 ml  Output 1150 ml  Net -670 ml   Filed Weights   09/11/18 0355 09/12/18 0300 09/13/18 0429  Weight: 86 kg 85.6 kg 83.6 kg    Examination:  General exam: Appears calm and comfortable  Respiratory system: Wheezing to auscultation. Respiratory effort normal. Cardiovascular system: S1 & S2 heard, RRR. No JVD, murmurs, rubs, gallops or clicks. No pedal edema. Gastrointestinal system: Abdomen is nondistended, soft and nontender. No organomegaly or masses felt. Normal bowel sounds heard. Central nervous system: Alert and oriented. No focal neurological deficits. Extremities: Symmetric 5 x 5 power. Skin: No  rashes, lesions or ulcers Psychiatry: Judgement and insight appear normal. Mood & affect appropriate.     Data Reviewed: I have personally reviewed following labs and imaging studies  CBC: Recent Labs  Lab 09/06/18 1648 09/08/18 1140 09/09/18 0703 09/10/18 0254 09/12/18 0259 09/13/18 0456  WBC 6.3 7.7 7.7 5.9 7.0 8.8  NEUTROABS 5.6 6.9 6.8  --   --    --   HGB 15.7 14.2 12.9* 13.7 14.9 16.2  HCT 48.6 43.4 41.1 42.6 46.3 49.9  MCV 89.2 87.9 89.0 87.5 88.2 88.6  PLT 171 197 202 225 404* 443*   Basic Metabolic Panel: Recent Labs  Lab 09/08/18 1140 09/09/18 0703 09/10/18 0254 09/12/18 0259 09/13/18 0456  NA 135 133* 134* 132* 134*  K 3.6 3.7 4.0 3.7 4.7  CL 102 100 98 93* 96*  CO2 GLUCOSE 113* 103* 124* 112* 99  BUN 28* 25* 31* 43* 40*  CREATININE 1.19 1.15 1.13 1.31* 1.27*  CALCIUM 7.5* 7.4* 7.6* 7.8* 8.2*  MG  --   --   --  2.6* 2.5*  PHOS  --   --   --   --  2.0*   GFR: Estimated Creatinine Clearance: 58.7 mL/min (A) (by C-G formula based on SCr of 1.27 mg/dL (H)). Liver Function Tests: Recent Labs  Lab 09/06/18 1648 09/08/18 1140  AST 70* 92*  ALT 44 49*  ALKPHOS 81 67  BILITOT 0.8 0.8  PROT 7.4 6.5  ALBUMIN 3.4* 2.9*   No results for input(s): LIPASE, AMYLASE in the last 168 hours. No results for input(s): AMMONIA in the last 168 hours. Coagulation Profile: No results for input(s): INR, PROTIME in the last 168 hours. Cardiac Enzymes: Recent Labs  Lab 09/08/18 1140  TROPONINI 0.05*   BNP (last 3 results) No results for input(s): PROBNP in the last 8760 hours. HbA1C: No results for input(s): HGBA1C in the last 72 hours. CBG: No results for input(s): GLUCAP in the last 168 hours. Lipid Profile: No results for input(s): CHOL, HDL, LDLCALC, TRIG, CHOLHDL, LDLDIRECT in the last 72 hours. Thyroid Function Tests: No results for input(s): TSH, T4TOTAL, FREET4, T3FREE, THYROIDAB in the last 72 hours. Anemia Panel: No results for input(s): VITAMINB12, FOLATE, FERRITIN, TIBC, IRON, RETICCTPCT in the last 72 hours. Sepsis Labs: Recent Labs  Lab 09/08/18 1140  LATICACIDVEN 1.7    Recent Results (from the past 240 hour(s))  Culture, blood (routine x 2)     Status: None (Preliminary result)   Collection Time: 09/08/18  2:40 PM  Result Value Ref Range Status   Specimen Description   Final     BLOOD RIGHT WRIST Performed at [facility], [address] [place], [place] [address]    Special Requests   Final    BOTTLES DRAWN AEROBIC AND ANAEROBIC Blood Culture results may not be optimal due to an excessive volume of blood received in culture bottles Performed at [facility], [address] [place], [place] [address]    Culture   Final    NO GROWTH 4 DAYS Performed at [facility], [address], [place], [place] [address]    Report Status PENDING  Incomplete  Culture, blood (routine x 2)     Status: None (Preliminary result)   Collection Time: 09/09/18  7:03 AM  Result Value Ref Range Status   Specimen Description   Final    BLOOD LEFT ARM Performed at [facility],  [address], [place], [place] [address]    Special Requests   Final    BOTTLES DRAWN AEROBIC ONLY Blood Culture adequate volume Performed at [facility], [address], [place], [place] [address]    Culture   Final    NO GROWTH 3 DAYS Performed at [facility], [address] [place], [place] [address]    Report Status PENDING  Incomplete  MRSA PCR Screening     Status: None   Collection Time: 09/09/18 12:00 PM  Result Value Ref Range Status   MRSA by PCR NEGATIVE NEGATIVE Final    Comment:        The GeneXpert MRSA Assay (FDA approved for NASAL specimens only), is one component of a comprehensive MRSA colonization surveillance program. It is not intended to diagnose MRSA infection nor to guide or monitor treatment for MRSA infections. Performed at [facility], [address] [place], [place] [address]          Radiology Studies: No results found.      Scheduled Meds: . albuterol  1-2 puff Inhalation Q6H  . aspirin EC  81 mg Oral Daily  . azithromycin  500 mg Oral Daily  . Chlorhexidine Gluconate Cloth  6 each Topical Daily  . cholecalciferol  1,000 Units Oral Daily   . clopidogrel  75 mg Oral Daily  . enoxaparin (LOVENOX) injection  40 mg Subcutaneous Q24H  . furosemide  20 mg Intravenous Daily  . furosemide  40 mg Intravenous Once  . hydroxychloroquine  200 mg Oral BID  . Influenza vac split quadrivalent PF  0.5 mL Intramuscular Tomorrow-1000  . levothyroxine  175 mcg Oral Q0600  . loratadine  10 mg Oral Daily  . mouth rinse  15 mL Mouth Rinse BID  . montelukast  10 mg Oral QHS  . pneumococcal 23 valent vaccine  0.5 mL Intramuscular Tomorrow-1000  . umeclidinium-vilanterol  1 puff Inhalation Daily  . vitamin C  1,000 mg Oral TID  . zinc sulfate  220 mg Oral Daily   Continuous Infusions:   LOS: 4 days     Alwyn Ren, MD [facility]  If 7PM-7AM, please contact night-coverage [email or web address] Password [email or web address] 09/13/2018, 11:13 AM

## 2018-09-13 NOTE — Progress Notes (Addendum)
NAME:  Matthew Evans, MRN:  [id number], DOB:  08/23/1950, LOS: 4 ADMISSION DATE:  09/08/2018, CONSULTATION DATE:  09/09/2018 REFERRING MD:  Towana BadgerE Matthews MD, CHIEF COMPLAINT:  Resp failure, COVID infection  Brief History   68 year old with mild COPD, hypertension, hyperlipidemia coronary artery disease.  Diagnosed with COVID 19 infection as outpatient  Past Medical History  Hypertension, hyperlipidemia, hypothyroidism, coronary artery disease, AAA, COPD  Significant Hospital Events   4/6- Transfer to ICU on 9 lt O2 4/6 - 4/7 Received Tocilizumab x 2 doses  Consults:  4/6 PCCM  Procedures:    Significant Diagnostic Tests:  Chest x-ray 4/6-worsening bilateral interstitial opacities  Micro Data:  Blood culture 4/5-pending  Antimicrobials:  Azithromycin 4/6  Interim history/subjective:  Comfortable on 15 L nasal cannula and maintaining O2 saturations of 94%.  Objective   Blood pressure (!) 104/48, pulse 76, temperature 98.1 F (36.7 C), temperature source Axillary, resp. rate (!) 26, height 5\' 8"  (1.727 m), weight 83.6 kg, SpO2 95 %.        Intake/Output Summary (Last 24 hours) at 09/13/2018 1032 Last data filed at 09/13/2018 0458 Gross per 24 hour  Intake 480 ml  Output 1150 ml  Net -670 ml   Filed Weights   09/11/18 0355 09/12/18 0300 09/13/18 0429  Weight: 86 kg 85.6 kg 83.6 kg  General: Comfortable male no acute distress HEENT: No JVD or lymphadenopathy is appreciated Neuro: Neurologically intact CV: s1s2 rrr, no m/r/g PULM: even/non-labored, lungs bilaterally decreased breath sounds throughout QM:VHQIGI:soft, non-tender, bsx4 active  Extremities: warm/dry, negative edema  Skin: no rashes or lesions   Resolved Hospital Problem list     Assessment & Plan:  68 year old with baseline COPD admitted with COVID-19 infection, respiratory failure  ARDS secondary to COVID-19 Currently comfortable on 15 L high flow O2 saturations are 94% Continue Choloroquine, azithromycin  Chloroquine, azithromycin, zinc, finish a 5 day course Encouraged to use with prone position Lasix x 1 4/10 per primary Second dose ordered for 1900 09/13/18  COPD Continue Anoro   Hypokalemia Recent Labs  Lab 09/10/18 0254 09/12/18 0259 09/13/18 0456  K 4.0 3.7 4.7    Replete as needed  Acute pulmonary edema  Intake/Output Summary (Last 24 hours) at 09/13/2018 1034 Last data filed at 09/13/2018 0458 Gross per 24 hour  Intake 480 ml  Output 1150 ml  Net -670 ml    Continue diuresis as tolerated  Goals of care Currently full code.  He does not need intubation at this time.  Best practice:  Diet: PO diet Pain/Anxiety/Delirium protocol (if indicated): NA VAP protocol (if indicated): NA DVT prophylaxis: Lovenox GI prophylaxis: NA Glucose control: Monitor blood sugars Mobility: Bed Code Status: full Family Communication: Patient updated at the bedside 09/13/2018 Disposition: ICU  Labs   CBC: Recent Labs  Lab 09/06/18 1648 09/08/18 1140 09/09/18 0703 09/10/18 0254 09/12/18 0259 09/13/18 0456  WBC 6.3 7.7 7.7 5.9 7.0 8.8  NEUTROABS 5.6 6.9 6.8  --   --   --   HGB 15.7 14.2 12.9* 13.7 14.9 16.2  HCT 48.6 43.4 41.1 42.6 46.3 49.9  MCV 89.2 87.9 89.0 87.5 88.2 88.6  PLT 171 197 202 225 404* 443*    Basic Metabolic Panel: Recent Labs  Lab 09/08/18 1140 09/09/18 0703 09/10/18 0254 09/12/18 0259 09/13/18 0456  NA 135 133* 134* 132* 134*  K 3.6 3.7 4.0 3.7 4.7  CL 102 100 98 93* 96*  CO2 24 23 25 27 26   GLUCOSE 113*  103* 124* 112* 99  BUN 28* 25* 31* 43* 40*  CREATININE 1.19 1.15 1.13 1.31* 1.27*  CALCIUM 7.5* 7.4* 7.6* 7.8* 8.2*  MG  --   --   --  2.6* 2.5*  PHOS  --   --   --   --  2.0*   GFR: Estimated Creatinine Clearance: 58.7 mL/min (A) (by C-G formula based on SCr of 1.27 mg/dL (H)). Recent Labs  Lab 09/08/18 1140 09/09/18 0703 09/10/18 0254 09/12/18 0259 09/13/18 0456  WBC 7.7 7.7 5.9 7.0 8.8  LATICACIDVEN 1.7  --   --   --   --      Liver Function Tests: Recent Labs  Lab 09/06/18 1648 09/08/18 1140  AST 70* 92*  ALT 44 49*  ALKPHOS 81 67  BILITOT 0.8 0.8  PROT 7.4 6.5  ALBUMIN 3.4* 2.9*   No results for input(s): LIPASE, AMYLASE in the last 168 hours. No results for input(s): AMMONIA in the last 168 hours.  ABG    Component Value Date/Time   TCO2 29 09/28/2016 0959     Coagulation Profile: No results for input(s): INR, PROTIME in the last 168 hours.  Cardiac Enzymes: Recent Labs  Lab 09/08/18 1140  TROPONINI 0.05*    HbA1C: No results found for: HGBA1C  CBG: No results for input(s): GLUCAP in the last 168 hours.  PCCM available PRN  Hold in the ICU given high O2 demand     Brett Canales Jerrye Seebeck ACNP [facility]ack PCCM Pager [phone] till 1 pm If no answer page [phone] 09/13/2018, 10:32 AM

## 2018-09-14 LAB — BASIC METABOLIC PANEL
Anion gap: 10 (ref 5–15)
BUN: 41 mg/dL — ABNORMAL HIGH (ref 8–23)
CO2: 27 mmol/L (ref 22–32)
Calcium: 7.9 mg/dL — ABNORMAL LOW (ref 8.9–10.3)
Chloride: 96 mmol/L — ABNORMAL LOW (ref 98–111)
Creatinine, Ser: 1.24 mg/dL (ref 0.61–1.24)
GFR calc Af Amer: >60 mL/min (ref >60)
GFR calc non Af Amer: 59 mL/min — ABNORMAL LOW (ref >60)
Glucose, Bld: 92 mg/dL (ref 70–99)
Potassium: 4.1 mmol/L (ref 3.5–5.1)
Sodium: 133 mmol/L — ABNORMAL LOW (ref 135–145)

## 2018-09-14 LAB — CULTURE, BLOOD (ROUTINE X 2)
Culture: NO GROWTH
Special Requests: ADEQUATE

## 2018-09-14 NOTE — Progress Notes (Signed)
PROGRESS NOTE    Matthew Evans  ZOX:[id number] DOB: 1951-01-09 DOA: 09/08/2018 PCP: Moshe Salisbury, NP   Brief Narrative:68 y.o.malewith medical history significant forhypertension, hyperlipidemia, hypothyroidism, coronary artery disease, AAA, COPD,recently diagnosed COVID-19viralinfection, who presented to [facility] ED due to acute onset of shortness of breath with hypoxia. Onset this morning after waking up. 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 Checked his oxygen saturation which was 86%. Became concerned and came to the ED for further evaluation. Patient was diagnosed with COVID-19 viral infection 4 days ago outpatientand has been self isolating with his wife. Denies cough,chest pain or any GI symptoms. States he was feeling febrile this morningand took Tylenol. TRH asked to admit due to new onset hypoxia in the setting of covid-19 viral infection.  ED Course:Upon presentation to the ED patient hypoxic with O2 saturation 88% on room air, improved on 2 L of oxygen by nasal cannula to 92%. Chest x-ray independently reviewed revealed worsening bilateral pulmonary infiltrates. Patient admitted as observation status due to acute hypoxic respiratory failure secondary to COVID-19 viral infection. Assessment & Plan:   Active Problems:   COVID-19 virus infection   Hypoxia   Goals of care, counseling/discussion   Acute pulmonary edema (HCC)   #1 acute hypoxic respiratory failure secondary to COVID-19.  Chest x-ray 09/13/2018 bilateral patchy areas of airspace disease with slight peripheral predominance similar in appearance to prior exam most concerning for multilobar pneumonia including viral pneumonia.  He is on his last day of Plaquenil.  Continue  azithromycin and MDI. Oxygen requirement increased to 30 L overnight. Encourage to lie prone.Potassium 4.1 . Changed MDI to 4 times a day. Patient is able to use inhaler by himself  Patient is very adamant about proning  and  wants to lay on his side most of the time.  #2 hypertensionblood pressure soft DC Norvasc Toprol continue Lasix at lower dose  #3 hypothyroidism continue Synthroid.  #4 CAD on aspirin and Plavix  #5 hyperlipidemia continue statin.  DVT prophylaxis:Lovenox Code StatusDO NOT RESUSCITATE Family Communication:Discussed with wife disposition Plan:pending clinicalimprovement  Consultants:  PCCM     Estimated body mass index is 28.02 kg/m as calculated from the following:   Height as of this encounter:  (1.727 m).   Weight as of this encounter: 83.6 kg.    Subjective:  Patient back on 30 L of oxygen overnight when I saw him this morning he was on his back though he told me he was on his belly overnight staff reports he was also confused trying to poop and pee on the floor he has been getting Ativan for anxiety staff also reported that he had nosebleed handful of blood today.  Patient is on humidified oxygen. Objective: Vitals:   09/14/18 0400 09/14/18 0600 09/14/18 0800 09/14/18 1000  BP: (!) 110/47 112/60 133/61 131/67  Pulse: 70 79 81 92  Resp: (!) 29 (!) 21 19 (!) 22  Temp: 98.7 F (37.1 C)     TempSrc: Oral     SpO2: 98% 100% 96% 95%  Weight:      Height:        Intake/Output Summary (Last 24 hours) at 09/14/2018 1130 Last data filed at 09/14/2018 1031 Gross per 24 hour  Intake --  Output 700 ml  Net -700 ml   Filed Weights   09/11/18 0355 09/12/18 0300 09/13/18 0429  Weight: 86 kg 85.6 kg 83.6 kg    Examination:  General exam: Appears calm and comfortable  Respiratory  system: Scattered rhonchi in both lung fields to auscultation. Respiratory effort normal. Cardiovascular system: S1 & S2 heard, RRR. No JVD, murmurs, rubs, gallops or clicks. No pedal edema. Gastrointestinal system: Abdomen is nondistended, soft and nontender. No organomegaly or masses felt. Normal bowel sounds heard. Central nervous system: Awake and oriented. No focal  neurological deficits.   Extremities: Symmetric 5 x 5 power. Skin: No rashes, lesions or ulcers   Data Reviewed: I have personally reviewed following labs and imaging studies  CBC: Recent Labs  Lab 09/08/18 1140 09/09/18 0703 09/10/18 0254 09/12/18 0259 09/13/18 0456  WBC 7.7 7.7 5.9 7.0 8.8  NEUTROABS 6.9 6.8  --   --   --   HGB 14.2 12.9* 13.7 14.9 16.2  HCT 43.4 41.1 42.6 46.3 49.9  MCV 87.9 89.0 87.5 88.2 88.6  PLT 197 202 225 404* 443*   Basic Metabolic Panel: Recent Labs  Lab 09/09/18 0703 09/10/18 0254 09/12/18 0259 09/13/18 0456 09/14/18 0252  NA 133* 134* 132* 134* 133*  K 3.7 4.0 3.7 4.7 4.1  CL 100 98 93* 96* 96*  CO2 23 25 27 26 27   GLUCOSE 103* 124* 112* 99 92  BUN 25* 31* 43* 40* 41*  CREATININE 1.15 1.13 1.31* 1.27* 1.24  CALCIUM 7.4* 7.6* 7.8* 8.2* 7.9*  MG  --   --  2.6* 2.5*  --   PHOS  --   --   --  2.0*  --    GFR: Estimated Creatinine Clearance: 60.1 mL/min (by C-G formula based on SCr of 1.24 mg/dL). Liver Function Tests: Recent Labs  Lab 09/08/18 1140  AST 92*  ALT 49*  ALKPHOS 67  BILITOT 0.8  PROT 6.5  ALBUMIN 2.9*   No results for input(s): LIPASE, AMYLASE in the last 168 hours. No results for input(s): AMMONIA in the last 168 hours. Coagulation Profile: No results for input(s): INR, PROTIME in the last 168 hours. Cardiac Enzymes: Recent Labs  Lab 09/08/18 1140  TROPONINI 0.05*   BNP (last 3 results) No results for input(s): PROBNP in the last 8760 hours. HbA1C: No results for input(s): HGBA1C in the last 72 hours. CBG: No results for input(s): GLUCAP in the last 168 hours. Lipid Profile: No results for input(s): CHOL, HDL, LDLCALC, TRIG, CHOLHDL, LDLDIRECT in the last 72 hours. Thyroid Function Tests: No results for input(s): TSH, T4TOTAL, FREET4, T3FREE, THYROIDAB in the last 72 hours. Anemia Panel: No results for input(s): VITAMINB12, FOLATE, FERRITIN, TIBC, IRON, RETICCTPCT in the last 72 hours. Sepsis  Labs: Recent Labs  Lab 09/08/18 1140  LATICACIDVEN 1.7    Recent Results (from the past 240 hour(s))  Culture, blood (routine x 2)     Status: None   Collection Time: 09/08/18  2:40 PM  Result Value Ref Range Status   Specimen Description   Final    BLOOD RIGHT WRIST Performed at [facility], [address] [place], [place] [address]    Special Requests   Final    BOTTLES DRAWN AEROBIC AND ANAEROBIC Blood Culture results may not be optimal due to an excessive volume of blood received in culture bottles Performed at [facility], [address], [place], [place] [address]    Culture   Final    NO GROWTH 5 DAYS Performed at [facility], [address] [place], [place] [address]    Report Status 09/13/2018 FINAL  Final  Culture, blood (routine x 2)     Status: None  Collection Time: 09/09/18  7:03 AM  Result Value Ref Range Status   Specimen Description   Final    BLOOD LEFT ARM Performed at [facility], [address] [place], [place] [address]    Special Requests   Final    BOTTLES DRAWN AEROBIC ONLY Blood Culture adequate volume Performed at [facility], [address] [place], [place] [address]    Culture   Final    NO GROWTH 5 DAYS Performed at [facility], [address] [place], [place] [address]    Report Status 09/14/2018 FINAL  Final  MRSA PCR Screening     Status: None   Collection Time: 09/09/18 12:00 PM  Result Value Ref Range Status   MRSA by PCR NEGATIVE NEGATIVE Final    Comment:        The GeneXpert MRSA Assay (FDA approved for NASAL specimens only), is one component of a comprehensive MRSA colonization surveillance program. It is not intended to diagnose MRSA infection nor to guide or monitor treatment for MRSA infections. Performed at [facility], [address] [place], [place] [address]          Radiology Studies: Dg  Chest Port 1 View  Result Date: 09/13/2018 CLINICAL DATA:  Respiratory failure, COVID-19 positive EXAM: PORTABLE CHEST 1 VIEW COMPARISON:  09/08/2018 FINDINGS: Bilateral patchy areas of airspace disease with a slight peripheral predominance similar in appearance to the prior examination most concerning for multilobar pneumonia including viral pneumonia. No pleural effusion or pneumothorax. Stable cardiomediastinal silhouette. No aggressive osseous lesion. IMPRESSION: Bilateral patchy areas of airspace disease with a slight peripheral predominance similar in appearance to the prior examination most concerning for multilobar pneumonia including viral pneumonia. Electronically Signed   By: Elige KoHetal  Patel   On: 09/13/2018 11:48        Scheduled Meds:  albuterol  1-2 puff Inhalation Q6H   aspirin EC  81 mg Oral Daily   azithromycin  500 mg Oral Daily   Chlorhexidine Gluconate Cloth  6 each Topical Daily   cholecalciferol  1,000 Units Oral Daily   clopidogrel  75 mg Oral Daily   enoxaparin (LOVENOX) injection  40 mg Subcutaneous Q24H   furosemide  20 mg Intravenous Daily   Influenza vac split quadrivalent PF  0.5 mL Intramuscular Tomorrow-1000   levothyroxine  175 mcg Oral Q0600   loratadine  10 mg Oral Daily   mouth rinse  15 mL Mouth Rinse BID   montelukast  10 mg Oral QHS   pneumococcal 23 valent vaccine  0.5 mL Intramuscular Tomorrow-1000   umeclidinium-vilanterol  1 puff Inhalation Daily   vitamin C  1,000 mg Oral TID   zinc sulfate  220 mg Oral Daily   Continuous Infusions:   LOS: 5 days     Alwyn RenElizabeth G Monicka Cyran, MD [facility]  If 7PM-7AM, please contact night-coverage [email or web address] Password [email or web address] 09/14/2018, 11:30 AM

## 2018-09-14 NOTE — Plan of Care (Signed)
  Problem: Elimination: Goal: Will not experience complications related to bowel motility Outcome: Progressing   Problem: Elimination: Goal: Will not experience complications related to urinary retention Outcome: Progressing   Problem: Skin Integrity: Goal: Risk for impaired skin integrity will decrease Outcome: Progressing   Problem: Nutrition: Goal: Adequate nutrition will be maintained Outcome: Progressing

## 2018-09-14 NOTE — Progress Notes (Signed)
09/14/2018  1035  Patient had a nose bleed this am from the right nare. MD aware.

## 2018-09-14 NOTE — Progress Notes (Signed)
09/14/2018  1445  Patient blew nose and two large blood clots came out. Water is at the water level for High flow canula.  MD aware.

## 2018-09-14 NOTE — Progress Notes (Signed)
The pt  appears to be pleasantly confused, many attempts have been made to redirect the pt. The Pt has been found  on three occassions getting out of bed, and standing up to have a bowel movement on the floor with 02 off. I will continue to monitor and redirect.

## 2018-09-15 LAB — BASIC METABOLIC PANEL
Anion gap: 13 (ref 5–15)
BUN: 38 mg/dL — ABNORMAL HIGH (ref 8–23)
CO2: 27 mmol/L (ref 22–32)
Calcium: 8.1 mg/dL — ABNORMAL LOW (ref 8.9–10.3)
Chloride: 92 mmol/L — ABNORMAL LOW (ref 98–111)
Creatinine, Ser: 1.15 mg/dL (ref 0.61–1.24)
GFR calc Af Amer: >60 mL/min (ref >60)
GFR calc non Af Amer: >60 mL/min (ref >60)
Glucose, Bld: 89 mg/dL (ref 70–99)
Potassium: 4.3 mmol/L (ref 3.5–5.1)
Sodium: 132 mmol/L — ABNORMAL LOW (ref 135–145)

## 2018-09-15 NOTE — Progress Notes (Signed)
rn still attempting to wean 02 but pt continues to desat with minimal activity. Pt overall has done well with prone position today between meals. Pt also continues to be anxious at times. No signs of distress at this time. No changes to note. Will continue to monitor.

## 2018-09-15 NOTE — Progress Notes (Signed)
Pt stable at this time. Rn titrating 02 (nrb and hfnc) per pt needs. Pt eating breakfast at this time and rn will prone pt after he eats breakfast. Pt remains short of breath with minimal exertion and activity intolerance remains. No symptoms of distress at this time. RN will continue to monitor.

## 2018-09-15 NOTE — Progress Notes (Signed)
PROGRESS NOTE    Matthew Evans  XBJ:[id number] DOB: 1951-02-12 DOA: 09/08/2018 PCP: Moshe SalisburyKendall, Lonnie, NP   Brief Narrative: 68 y.o.malewith medical history significant forhypertension, hyperlipidemia, hypothyroidism, coronary artery disease, AAA, COPD,recently diagnosed COVID-19viralinfection, who [facility] due to acute onset of shortness of breath with hypoxia. Onset this morning after waking up. 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 Checked his oxygen saturation which was 86%. Became concerned and came to the ED for further evaluation. Patient was diagnosed with COVID-19 viral infection 4 days ago outpatientand has been self isolating with his wife. Denies cough,chest pain or any GI symptoms. States he was feeling febrile this morningand took Tylenol. TRH asked to admit due to new onset hypoxia in the setting of covid-19 viral infection.  ED Course:Upon presentation to the ED patient hypoxic with O2 saturation 88% on room air, improved on 2 L of oxygen by nasal cannula to 92%. Chest x-ray independently reviewed revealed worsening bilateral pulmonary infiltrates. Patient admitted as observation status due to acute hypoxic respiratory failure secondary to COVID-19 viral infection.  Assessment & Plan:   Active Problems:   COVID-19 virus infection   Hypoxia   Goals of care, counseling/discussion   Acute pulmonary edema (HCC)   #1 acute hypoxic respiratory failure secondary to COVID-19.  Chest x-ray 09/13/2018 bilateral patchy areas of airspace disease with slight peripheral predominance similar in appearance to prior exam most concerning for multilobar pneumonia including viral pneumonia.  He received a full course of Plaquenil.  Continue  azithromycin and MDI.  He is currently on 25 L of oxygenwill encourage him to prone and titrate oxygen down as he can tolerate.  #2 hypertensionblood pressure soft DC Norvasc Toprol continue Lasix at lower dose  #3  hypothyroidism continue Synthroid.  #4 CAD on aspirin and Plavix  #5 hyperlipidemia continue statin.  DVT prophylaxis:Lovenox Code StatusDO NOT RESUSCITATE Family Communication:Discussed with wife disposition Plan:pending clinicalimprovement  Consultants:  PCCM  Estimated body mass index is 28.02 kg/m as calculated from the following:   Height as of this encounter: 5\' 8"  (1.727 m).   Weight as of this encounter: 83.6 kg.    Subjective: Resting in bed   Objective: Vitals:   09/15/18 0826 09/15/18 0830 09/15/18 0900 09/15/18 0959  BP: 127/63   (!) 143/74  Pulse: 99 86 90 90  Resp: 20 20 19 20   Temp: 97.9 F (36.6 C)     TempSrc: Axillary     SpO2: 96% 96% 98% 95%  Weight:      Height:        Intake/Output Summary (Last 24 hours) at 09/15/2018 1046 Last data filed at 09/15/2018 0953 Gross per 24 hour  Intake 960 ml  Output 1500 ml  Net -540 ml   Filed Weights   09/11/18 0355 09/12/18 0300 09/13/18 0429  Weight: 86 kg 85.6 kg 83.6 kg    Examination:  General exam: Appears calm and comfortable  Respiratory system bilateral wheezing to auscultation. Respiratory effort normal. Cardiovascular system: S1 & S2 heard, RRR. No JVD, murmurs, rubs, gallops or clicks. No pedal edema. Gastrointestinal system: Abdomen is nondistended, soft and nontender. No organomegaly or masses felt. Normal bowel sounds heard. Central nervous system: Alert and oriented. No focal neurological deficits. Extremities: Symmetric 5 x 5 power. Skin: No rashes, lesions or ulcers Psychiatry: Judgement and insight appear normal. Mood & affect appropriate.     Data Reviewed: I have personally reviewed following labs and imaging studies  CBC: Recent Labs  Lab 09/08/18 1140 09/09/18 0703 09/10/18 0254 09/12/18 0259 09/13/18 0456  WBC 7.7 7.7 5.9 7.0 8.8  NEUTROABS 6.9 6.8  --   --   --   HGB 14.2 12.9* 13.7 14.9 16.2  HCT 43.4 41.1 42.6 46.3 49.9  MCV 87.9 89.0 87.5 88.2 88.6   PLT 197 202 225 404* 443*   Basic Metabolic Panel: Recent Labs  Lab 09/10/18 0254 09/12/18 0259 09/13/18 0456 09/14/18 0252 09/15/18 0653  NA 134* 132* 134* 133* 132*  K 4.0 3.7 4.7 4.1 4.3  CL 98 93* 96* 96* 92*  CO2 GLUCOSE 124* 112* 99 92 89  BUN 31* 43* 40* 41* 38*  CREATININE 1.13 1.31* 1.27* 1.24 1.15  CALCIUM 7.6* 7.8* 8.2* 7.9* 8.1*  MG  --  2.6* 2.5*  --   --   PHOS  --   --  2.0*  --   --    GFR: Estimated Creatinine Clearance: 64.8 mL/min (by C-G formula based on SCr of 1.15 mg/dL). Liver Function Tests: Recent Labs  Lab 09/08/18 1140  AST 92*  ALT 49*  ALKPHOS 67  BILITOT 0.8  PROT 6.5  ALBUMIN 2.9*   No results for input(s): LIPASE, AMYLASE in the last 168 hours. No results for input(s): AMMONIA in the last 168 hours. Coagulation Profile: No results for input(s): INR, PROTIME in the last 168 hours. Cardiac Enzymes: Recent Labs  Lab 09/08/18 1140  TROPONINI 0.05*   BNP (last 3 results) No results for input(s): PROBNP in the last 8760 hours. HbA1C: No results for input(s): HGBA1C in the last 72 hours. CBG: No results for input(s): GLUCAP in the last 168 hours. Lipid Profile: No results for input(s): CHOL, HDL, LDLCALC, TRIG, CHOLHDL, LDLDIRECT in the last 72 hours. Thyroid Function Tests: No results for input(s): TSH, T4TOTAL, FREET4, T3FREE, THYROIDAB in the last 72 hours. Anemia Panel: No results for input(s): VITAMINB12, FOLATE, FERRITIN, TIBC, IRON, RETICCTPCT in the last 72 hours. Sepsis Labs: Recent Labs  Lab 09/08/18 1140  LATICACIDVEN 1.7    Recent Results (from the past 240 hour(s))  Culture, blood (routine x 2)     Status: None   Collection Time: 09/08/18  2:40 PM  Result Value Ref Range Status   Specimen Description   Final    BLOOD RIGHT WRIST Performed at [facility], [address], [place], [place] [address]    Special Requests   Final    BOTTLES DRAWN AEROBIC AND ANAEROBIC Blood  Culture results may not be optimal due to an excessive volume of blood received in culture bottles Performed at [facility], [address], [place], [place] [address]    Culture   Final    NO GROWTH 5 DAYS Performed at [facility], [address] [place], [place] [address]    Report Status 09/13/2018 FINAL  Final  Culture, blood (routine x 2)     Status: None   Collection Time: 09/09/18  7:03 AM  Result Value Ref Range Status   Specimen Description   Final    BLOOD LEFT ARM Performed at [facility], [address], [place], [place] [address]    Special Requests   Final    BOTTLES DRAWN AEROBIC ONLY Blood Culture adequate volume Performed at [facility], [address], [place], [place] [address]    Culture   Final    NO GROWTH 5 DAYS Performed at  [facility], [address], [place], [place] [address]    Report Status 09/14/2018 FINAL  Final  MRSA PCR Screening     Status: None   Collection Time: 09/09/18 12:00 PM  Result Value Ref Range Status   MRSA by PCR NEGATIVE NEGATIVE Final    Comment:        The GeneXpert MRSA Assay (FDA approved for NASAL specimens only), is one component of a comprehensive MRSA colonization surveillance program. It is not intended to diagnose MRSA infection nor to guide or monitor treatment for MRSA infections. Performed at [facility], [address], [place], [place] [address]          Radiology Studies: Dg Chest Port 1 View  Result Date: 09/13/2018 CLINICAL DATA:  Respiratory failure, COVID-19 positive EXAM: PORTABLE CHEST 1 VIEW COMPARISON:  09/08/2018 FINDINGS: Bilateral patchy areas of airspace disease with a slight peripheral predominance similar in appearance to the prior examination most concerning for multilobar pneumonia including viral pneumonia. No pleural effusion or pneumothorax. Stable cardiomediastinal silhouette. No aggressive  osseous lesion. IMPRESSION: Bilateral patchy areas of airspace disease with a slight peripheral predominance similar in appearance to the prior examination most concerning for multilobar pneumonia including viral pneumonia. Electronically Signed   By: Elige Ko   On: 09/13/2018 11:48        Scheduled Meds: . albuterol  1-2 puff Inhalation Q6H  . aspirin EC  81 mg Oral Daily  . azithromycin  500 mg Oral Daily  . Chlorhexidine Gluconate Cloth  6 each Topical Daily  . cholecalciferol  1,000 Units Oral Daily  . clopidogrel  75 mg Oral Daily  . enoxaparin (LOVENOX) injection  40 mg Subcutaneous Q24H  . furosemide  20 mg Intravenous Daily  . Influenza vac split quadrivalent PF  0.5 mL Intramuscular Tomorrow-1000  . levothyroxine  175 mcg Oral Q0600  . loratadine  10 mg Oral Daily  . mouth rinse  15 mL Mouth Rinse BID  . montelukast  10 mg Oral QHS  . pneumococcal 23 valent vaccine  0.5 mL Intramuscular Tomorrow-1000  . umeclidinium-vilanterol  1 puff Inhalation Daily  . vitamin C  1,000 mg Oral TID  . zinc sulfate  220 mg Oral Daily   Continuous Infusions:   LOS: 6 days     Alwyn Ren, MD [facility]  If 7PM-7AM, please contact night-coverage [email or web address] Password [facility] 09/15/2018, 10:46 AM

## 2018-09-15 NOTE — Plan of Care (Signed)
  Problem: Clinical Measurements: Goal: Will remain free from infection Outcome: Progressing   Problem: Clinical Measurements: Goal: Respiratory complications will improve Outcome: Progressing   Problem: Clinical Measurements: Goal: Respiratory complications will improve Outcome: Progressing   Problem: Activity: Goal: Risk for activity intolerance will decrease Outcome: Progressing   Problem: Nutrition: Goal: Adequate nutrition will be maintained Outcome: Progressing   Problem: Nutrition: Goal: Adequate nutrition will be maintained Outcome: Progressing   Problem: Elimination: Goal: Will not experience complications related to bowel motility Outcome: Progressing

## 2018-09-15 NOTE — Progress Notes (Signed)
Pt stable overall with no needs. Pt  Wife called and updated on pt overall condition. Pt eating dinner after being prone for two hours. No changes to note overall. rn will continue to monitor.

## 2018-09-15 NOTE — Progress Notes (Signed)
Pt QTC is 0.500 per central tele.

## 2018-09-15 NOTE — Progress Notes (Signed)
Pt was in prone position for six hours today and tolerated well. No needs at this time. No changes to note overall.

## 2018-09-16 DIAGNOSIS — I714 Abdominal aortic aneurysm, without rupture: Secondary | ICD-10-CM

## 2018-09-16 DIAGNOSIS — I10 Essential (primary) hypertension: Secondary | ICD-10-CM

## 2018-09-16 DIAGNOSIS — Z888 Allergy status to other drugs, medicaments and biological substances status: Secondary | ICD-10-CM

## 2018-09-16 DIAGNOSIS — I447 Left bundle-branch block, unspecified: Secondary | ICD-10-CM

## 2018-09-16 DIAGNOSIS — Z7902 Long term (current) use of antithrombotics/antiplatelets: Secondary | ICD-10-CM

## 2018-09-16 DIAGNOSIS — H9190 Unspecified hearing loss, unspecified ear: Secondary | ICD-10-CM

## 2018-09-16 DIAGNOSIS — I252 Old myocardial infarction: Secondary | ICD-10-CM

## 2018-09-16 DIAGNOSIS — J449 Chronic obstructive pulmonary disease, unspecified: Secondary | ICD-10-CM

## 2018-09-16 DIAGNOSIS — Z955 Presence of coronary angioplasty implant and graft: Secondary | ICD-10-CM

## 2018-09-16 DIAGNOSIS — Z7982 Long term (current) use of aspirin: Secondary | ICD-10-CM

## 2018-09-16 DIAGNOSIS — Z79899 Other long term (current) drug therapy: Secondary | ICD-10-CM

## 2018-09-16 DIAGNOSIS — I2510 Atherosclerotic heart disease of native coronary artery without angina pectoris: Secondary | ICD-10-CM

## 2018-09-16 DIAGNOSIS — Z87891 Personal history of nicotine dependence: Secondary | ICD-10-CM

## 2018-09-16 NOTE — Progress Notes (Signed)
PROGRESS NOTE    Matthew Evans  ZOX:[id number] DOB: [birth year] DOA: 09/08/2018 PCP: Moshe Salisbury, NP   Brief Narrative:68 y.o.malewith medical history significant forhypertension, hyperlipidemia, hypothyroidism, coronary artery disease, AAA, COPD,recently diagnosed COVID-19viralinfection, who presented to [facility] ED due to acute onset of shortness of breath with hypoxia. Onset this morning after waking up. 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 Checked his oxygen saturation which was 86%. Became concerned and came to the ED for further evaluation. Patient was diagnosed with COVID-19 viral infection 4 days ago outpatientand has been self isolating with his wife. Denies cough,chest pain or any GI symptoms. States he was feeling febrile this morningand took Tylenol. TRH asked to admit due to new onset hypoxia in the setting of covid-19 viral infection.  ED Course:Upon presentation to the ED patient hypoxic with O2 saturation 88% on room air, improved on 2 L of oxygen by nasal cannula to 92%. Chest x-ray independently reviewed revealed worsening bilateral pulmonary infiltrates. Patient admitted as observation status due to acute hypoxic respiratory failure secondary to COVID-19 viral infection.   Assessment & Plan:   Active Problems:   COVID-19 virus infection   Hypoxia   Goals of care, counseling/discussion   Acute pulmonary edema (HCC)  #1 acute hypoxic respiratory failure secondary to COVID-19.Chest x-ray 09/13/2018 bilateral patchy areas of airspace disease with slight peripheral predominance similar in appearance to prior exam most concerning for multilobar pneumonia including viral pneumonia. He received a full course of Plaquenil. Continue azithromycin and MDI.  He is currently on 10 L of oxygenwill encourage him to prone and titrate oxygen down as he can tolerate.  #2 hypertensionblood pressure soft DC Norvasc Toprol continue Lasix at lower dose  #3  hypothyroidism continue Synthroid.  #4 CAD on aspirin and Plavix  #5 hyperlipidemia continue statin.  DVT prophylaxis:Lovenox Code StatusDO NOT RESUSCITATE Family Communication:Discussed with wife disposition Plan:pending clinicalimprovement  Consultants:  PCCM     Estimated body mass index is 28.02 kg/m as calculated from the following:   Height as of this encounter:  (1.727 m).   Weight as of this encounter: 83.6 kg.    Subjective: Awake alert asking for breakfast...on 8 lit of oxygen was prone most of the night..  Objective: Vitals:   09/16/18 0200 09/16/18 0209 09/16/18 0400 09/16/18 0600  BP: (!) 90/42 (!) 123/40 (!) 137/59 (!) 137/54  Pulse: 74 75 67 78  Resp: 20 19 (!) 24 (!) 23  Temp:   97.7 F (36.5 C)   TempSrc:   Axillary   SpO2: 97% 97% 95% 92%  Weight:      Height:        Intake/Output Summary (Last 24 hours) at 09/16/2018 1116 Last data filed at 09/16/2018 0600 Gross per 24 hour  Intake 1080 ml  Output 1100 ml  Net -20 ml   Filed Weights   09/11/18 0355 09/12/18 0300 09/13/18 0429  Weight: 86 kg 85.6 kg 83.6 kg    Examination:  General exam: Appears calm and comfortable  Respiratory system: decreased wheezing  to auscultation. Respiratory effort normal. Cardiovascular system: S1 & S2 heard, RRR. No JVD, murmurs, rubs, gallops or clicks. No pedal edema. Gastrointestinal system: Abdomen is nondistended, soft and nontender. No organomegaly or masses felt. Normal bowel sounds heard. Central nervous system: Alert and oriented. No focal neurological deficits. Extremities: Symmetric 5 x 5 power. Skin: No rashes, lesions or ulcers Psychiatry: Judgement and insight appear normal. Mood & affect appropriate.     Data  Reviewed: I have personally reviewed following labs and imaging studies  CBC: Recent Labs  Lab 09/10/18 0254 09/12/18 0259 09/13/18 0456  WBC 5.9 7.0 8.8  HGB 13.7 14.9 16.2  HCT 42.6 46.3 49.9  MCV 87.5 88.2  88.6  PLT 225 404* 443*   Basic Metabolic Panel: Recent Labs  Lab 09/10/18 0254 09/12/18 0259 09/13/18 0456 09/14/18 0252 09/15/18 0653  NA 134* 132* 134* 133* 132*  K 4.0 3.7 4.7 4.1 4.3  CL 98 93* 96* 96* 92*  CO2 25 27 26 27 27   GLUCOSE 124* 112* 99 92 89  BUN 31* 43* 40* 41* 38*  CREATININE 1.13 1.31* 1.27* 1.24 1.15  CALCIUM 7.6* 7.8* 8.2* 7.9* 8.1*  MG  --  2.6* 2.5*  --   --   PHOS  --   --  2.0*  --   --    GFR: Estimated Creatinine Clearance: 64.8 mL/min (by C-G formula based on SCr of 1.15 mg/dL). Liver Function Tests: No results for input(s): AST, ALT, ALKPHOS, BILITOT, PROT, ALBUMIN in the last 168 hours. No results for input(s): LIPASE, AMYLASE in the last 168 hours. No results for input(s): AMMONIA in the last 168 hours. Coagulation Profile: No results for input(s): INR, PROTIME in the last 168 hours. Cardiac Enzymes: No results for input(s): CKTOTAL, CKMB, CKMBINDEX, TROPONINI in the last 168 hours. BNP (last 3 results) No results for input(s): PROBNP in the last 8760 hours. HbA1C: No results for input(s): HGBA1C in the last 72 hours. CBG: No results for input(s): GLUCAP in the last 168 hours. Lipid Profile: No results for input(s): CHOL, HDL, LDLCALC, TRIG, CHOLHDL, LDLDIRECT in the last 72 hours. Thyroid Function Tests: No results for input(s): TSH, T4TOTAL, FREET4, T3FREE, THYROIDAB in the last 72 hours. Anemia Panel: No results for input(s): VITAMINB12, FOLATE, FERRITIN, TIBC, IRON, RETICCTPCT in the last 72 hours. Sepsis Labs: No results for input(s): PROCALCITON, LATICACIDVEN in the last 168 hours.  Recent Results (from the past 240 hour(s))  Culture, blood (routine x 2)     Status: None   Collection Time: 09/08/18  2:40 PM  Result Value Ref Range Status   Specimen Description   Final    BLOOD RIGHT WRIST Performed at [facility], [address], [place], [place] [address]    Special Requests   Final    BOTTLES DRAWN  AEROBIC AND ANAEROBIC Blood Culture results may not be optimal due to an excessive volume of blood received in culture bottles Performed at [facility], [address] [place], [place] [address]    Culture   Final    NO GROWTH 5 DAYS Performed at [facility], [address], [place], [place] [address]    Report Status 09/13/2018 FINAL  Final  Culture, blood (routine x 2)     Status: None   Collection Time: 09/09/18  7:03 AM  Result Value Ref Range Status   Specimen Description   Final    BLOOD LEFT ARM Performed at [facility], [address] [place], [place] [address]    Special Requests   Final    BOTTLES DRAWN AEROBIC ONLY Blood Culture adequate volume Performed at [facility], [address] [place], [place] [address]    Culture   Final    NO GROWTH 5 DAYS Performed at [facility], [address] [place], [place] [address]    Report Status 09/14/2018 FINAL  Final  MRSA PCR Screening     Status: None   Collection Time: 09/09/18 12:00 PM  Result Value Ref Range Status   MRSA by PCR NEGATIVE NEGATIVE Final    Comment:        The GeneXpert MRSA Assay (FDA approved for NASAL specimens only), is one component of a comprehensive MRSA colonization surveillance program. It is not intended to diagnose MRSA infection nor to guide or monitor treatment for MRSA infections. Performed at [facility], [address] [place], [place] [id number]          Radiology Studies: No results found.      Scheduled Meds: . albuterol  1-2 puff Inhalation Q6H  . aspirin EC  81 mg Oral Daily  . azithromycin  500 mg Oral Daily  . Chlorhexidine Gluconate Cloth  6 each Topical Daily  . cholecalciferol  1,000 Units Oral Daily  . clopidogrel  75 mg Oral Daily  . enoxaparin (LOVENOX) injection  40 mg Subcutaneous Q24H  . furosemide  20 mg Intravenous Daily  . Influenza vac split quadrivalent  PF  0.5 mL Intramuscular Tomorrow-1000  . levothyroxine  175 mcg Oral Q0600  . loratadine  10 mg Oral Daily  . mouth rinse  15 mL Mouth Rinse BID  . montelukast  10 mg Oral QHS  . pneumococcal 23 valent vaccine  0.5 mL Intramuscular Tomorrow-1000  . umeclidinium-vilanterol  1 puff Inhalation Daily  . vitamin C  1,000 mg Oral TID  . zinc sulfate  220 mg Oral Daily   Continuous Infusions:   LOS: 7 days     Alwyn RenElizabeth G Mathews, MD [facility]  If 7PM-7AM, please contact night-coverage [email or web address] Password [email or web address] 09/16/2018, 11:16 AM

## 2018-09-16 NOTE — Progress Notes (Signed)
Pt. Has remained prone most of the shift for approximately 11 hours total. Decreased from 25L to 8L HFNC.

## 2018-09-16 NOTE — Progress Notes (Signed)
Pt. BP is 124/40 (63) E-link nurse Gretchen notified. Agreed to keep an eye on it closely but no interventions thus far.

## 2018-09-16 NOTE — Consult Note (Signed)
Cardiology Consultation:   Patient ID: Matthew Evans MRN: [id number]; DOB: 31-Dec-1950  Admit date: 09/08/2018 Date of Consult: 09/16/2018  Primary Care Provider: Moshe Salisbury, NP Primary Cardiologist: Nicki Guadalajara, MD  Primary Electrophysiologist:  None   Audio/video visit performed viaELINKtelehealth platform to minimize risk of exposure and to decrease PPE use in the setting ofCOVID-19pandemic   Patient Profile:   Matthew Evans is a 68 y.o. male with a hx of CAD, AAA, COPD, HTN who is being seen today for the evaluation of LBBB at the request of Dr. Jerolyn Center.  History of Present Illness:   Matthew Evans was admitted with worsening hypoxemia requiring oxygen supplementation in the setting of known COVID-19 infection diagnosed several days ago.  His dyspnea has improved with oxygen supplementation.  He does not feel any worsening of the dyspnea when he lies flat in bed.  He has not had any chest pain since his last visit with Dr. Tresa Endo in September 2019.  Matthew Evans had an ST segment elevation inferior myocardial infarction treated with stenting of the right coronary artery in April 2009 and subsequent complex angioplasty without stenting of the LAD artery.  Repeat coronary angiography in April 2018 showed patent vessels with a 30% mid-distal LAD stenosis and a widely patent right coronary artery stent.  The nuclear study in April 2016 showed reduced left ventricular systolic function with ejection fraction 31% and mid to basilar inferior wall ischemia, but the echocardiogram which was also performed in April 2018 showed an ejection fraction of 45-50% with severe hypokinesis of the mid anteroseptal myocardium and grade 1 diastolic dysfunction.  He has been taking a low-dose of beta-blockers for palpitations, which appeared to be secondary to PVCs.  He does have COPD and takes both short and long-acting bronchodilators.  He bears a diagnosis of obstructive sleep apnea but has not been using CPAP  in the last 4 years and his repeat sleep study in 2018 showed only mild increased upper airway resistance syndrome without definitive sleep apnea.  He had a Nissen fundoplication in 2015 which has led to some weight loss.  His AAA is only moderate in size with a maximum diameter of 3.3 cm on the most recent ultrasound in April 2019.  This has been a stable diameter over many years. He has a history of cutaneous lupus and has received hydroxychloroquine for this in the past.  Review of his electrocardiograms shows a left bundle branch block with left axis deviation at least as far back as 2012.  Past Medical History:  Diagnosis Date  . AAA (abdominal aortic aneurysm) (HCC)    mild; doppler 08/29/12-3.4x3.7  . Arthritis    "hands, back" (09/28/2016)  . CAD (coronary artery disease)    echo 07/26/12-EF 55-60%; myoview 07/26/12-low normal wall motion with mild apical hypokinesis as well a septal wall motion consistent with LBBB  . Carotid artery occlusion   . Chronic lower back pain   . Cutaneous lupus erythematosus    [name], [name]  . Dementia (HCC)    "moderate; dx'd in ~ 2011" (09/28/2016)  . Dermatitis   . Emphysema   . GERD (gastroesophageal reflux disease)   . H/O hiatal hernia   . Headache(784.0)   . History of palpitations   . HTN (hypertension)   . Hyperlipidemia   . Hypothyroidism    HX OF RADIOACTIVE IODINE   . Memory loss  d/t mini stroke  . Mini stroke (HCC) ~ 2011  . Myocardial infarction (HCC) 09/2007   PCI to RCA  . Shortness of breath    WITH EXERTION  . Sleep apnea    sleep study 12/14/06 ahi 16.89, during rem 12.3, supine ahi 57.49; cpap 02/22/07-c-flex of 3 at 13cm h2o; PT STATES HE DOES NOT USE HIS CPAP EVERY NIGHT  . Sleep apnea    "aien't worn mask in 3 years" (09/28/2016)  . Stroke [facility])    PT STATES NEUROLOGIST TOLD HIM HE HAD HAD MINI STROKE  . Thyroid disease    hypothyroidism    Past  Surgical History:  Procedure Laterality Date  . balloon atherotomy     LAD  . BICEPS TENDON REPAIR Right 09/25/2016   "cleaned out spurs and arthritis" (09/28/2016)  . CARDIAC CATHETERIZATION  08/11/11   EF50-55%, small AAA, no signifiant stent restenosis in RCA , no restenosis of PTCA of LAD, medical therapy  . CARDIAC CATHETERIZATION  09/27/06   cutting balloon arthrotomy of LAD stenosis with dilatation of a 3.5x46mm cutting balloon  . CARDIAC CATHETERIZATION  01/07/09   50-60% progressive dz beyond the stented segment in the RCA , LAD intervention remained patent  . CORONARY ANGIOPLASTY WITH STENT PLACEMENT  09/24/06   PTCA/stent of RCA with a 3.5x48mm Taxus DES post dilated to 3.55mm  . CORONARY ANGIOPLASTY WITH STENT PLACEMENT Right 09/2007  . ESOPHAGEAL MANOMETRY N/A 08/04/2013   Procedure: ESOPHAGEAL MANOMETRY (EM);  Surgeon: Theda Belfast, MD;  Location: [facility];  Service: Endoscopy;  Laterality: N/A;  . HERNIA REPAIR    . HIATAL HERNIA REPAIR  2011?  Marland Kitchen LEFT HEART CATH AND CORONARY ANGIOGRAPHY N/A 10/02/2016   Procedure: Left Heart Cath and Coronary Angiography;  Surgeon: Marykay Lex, MD;  Location: [facility] INVASIVE CV LAB;  Service: Cardiovascular;  Laterality: N/A;  . LEFT HEART CATHETERIZATION WITH CORONARY ANGIOGRAM N/A 08/11/2011   Procedure: LEFT HEART CATHETERIZATION WITH CORONARY ANGIOGRAM;  Surgeon: Lennette Bihari, MD;  Location: [facility] CATH LAB;  Service: Cardiovascular;  Laterality: N/A;  . SHOULDER SURGERY Right      Home Medications:  Prior to Admission medications   Medication Sig Start Date End Date Taking? Authorizing Provider  acetaminophen (TYLENOL) 500 MG tablet Take 1,000 mg by mouth every 6 (six) hours as needed for moderate pain.   Yes [provider]  Albuterol Sulfate (PROAIR RESPICLICK) 108 (90 Base) MCG/ACT AEPB Inhale 2 puffs into the lungs every 6 (six) hours as needed. Patient taking differently: Inhale 2 puffs into the lungs every 6 (six) hours as  needed (sob and wheezing).  05/23/18  Yes Oretha Milch, MD  amLODipine (NORVASC) 5 MG tablet Take 1 tablet (5 mg total) by mouth daily. 02/20/18  Yes Lennette Bihari, MD  aspirin 81 MG EC tablet TAKE 1 TABLET BY MOUTH EVERY DAY Patient taking differently: Take 81 mg by mouth daily.  12/03/17  Yes Lennette Bihari, MD  clopidogrel (PLAVIX) 75 MG tablet TAKE 1 TABLET (75 MG TOTAL) BY MOUTH DAILY WITH BREAKFAST. Patient taking differently: Take 75 mg by mouth daily.  02/20/18  Yes Lennette Bihari, MD  cyclobenzaprine (FLEXERIL) 10 MG tablet Take 10 mg by mouth 2 (two) times daily as needed for muscle spasms.  08/31/14  Yes [provider]  Fluticasone-Umeclidin-Vilant (TRELEGY ELLIPTA) 100-62.5-25 MCG/INH AEPB Inhale 1 puff into the lungs daily. 06/20/18  Yes Oretha Milch, MD  guaiFENesin-codeine 100-10 MG/5ML syrup Take 5  mLs by mouth every 6 (six) hours as needed for cough.  09/02/18  Yes [provider]  hydrochlorothiazide (MICROZIDE) 12.5 MG capsule Take 1 capsule (12.5 mg total) by mouth every other day. 02/20/18  Yes Lennette Bihari, MD  HYDROcodone-acetaminophen (NORCO) 7.5-325 MG tablet Take 1 tablet by mouth every 6 (six) hours as needed for moderate pain or severe pain.  08/31/18  Yes [provider]  isosorbide mononitrate (IMDUR) 60 MG 24 hr tablet Take 1.5 tablets (90 mg total) by mouth daily. 02/20/18  Yes Lennette Bihari, MD  levofloxacin (LEVAQUIN) 500 MG tablet Take 500 mg by mouth daily.   Yes [provider]  losartan (COZAAR) 25 MG tablet Take 25 mg by mouth every evening.  06/16/18  Yes [provider]  methocarbamol (ROBAXIN) 500 MG tablet Take 500 mg by mouth every 6 (six) hours as needed for muscle spasms.  05/04/18  Yes [provider]  metoprolol succinate (TOPROL-XL) 25 MG 24 hr tablet Take 1 tablet (25 mg total) by mouth daily. 02/20/18  Yes Lennette Bihari, MD  Omega-3 Fatty Acids (FISH OIL) 1200 MG CAPS Take 1,200 mg by mouth 2  (two) times daily.    Yes [provider]  ondansetron (ZOFRAN ODT) 4 MG disintegrating tablet Take 1 tablet (4 mg total) by mouth every 8 (eight) hours as needed. Patient taking differently: Take 4 mg by mouth every 8 (eight) hours as needed for nausea.  09/06/18  Yes Jacalyn Lefevre, MD  SYNTHROID 175 MCG tablet Take 175 mcg by mouth daily before breakfast.  06/26/18  Yes [provider]  levothyroxine (SYNTHROID) 100 MCG tablet Take 1 tablet (100 mcg total) by mouth daily before breakfast. Patient not taking: Reported on 09/06/2018 10/19/16   Lennette Bihari, MD  losartan (COZAAR) 50 MG tablet Take 1.5 tablets (75 mg total) by mouth daily. Patient not taking: Reported on 09/06/2018 02/20/18 02/15/19  Lennette Bihari, MD  umeclidinium-vilanterol [facility] ELLIPTA) 62.5-25 MCG/INH AEPB INHALE 1 PUFF INTO THE LUNGS DAILY.  Needs office visit for refills. Patient not taking: Reported on 09/06/2018 05/09/18   Oretha Milch, MD    Inpatient Medications: Scheduled Meds: . albuterol  1-2 puff Inhalation Q6H  . aspirin EC  81 mg Oral Daily  . azithromycin  500 mg Oral Daily  . Chlorhexidine Gluconate Cloth  6 each Topical Daily  . cholecalciferol  1,000 Units Oral Daily  . clopidogrel  75 mg Oral Daily  . enoxaparin (LOVENOX) injection  40 mg Subcutaneous Q24H  . furosemide  20 mg Intravenous Daily  . Influenza vac split quadrivalent PF  0.5 mL Intramuscular Tomorrow-1000  . levothyroxine  175 mcg Oral Q0600  . loratadine  10 mg Oral Daily  . mouth rinse  15 mL Mouth Rinse BID  . montelukast  10 mg Oral QHS  . pneumococcal 23 valent vaccine  0.5 mL Intramuscular Tomorrow-1000  . umeclidinium-vilanterol  1 puff Inhalation Daily   Continuous Infusions:  PRN Meds: LORazepam  Allergies:    Allergies  Allergen Reactions  . Niaspan [Niacin] Rash  . Septra [Sulfamethoxazole-Trimethoprim] Rash  . Simcor [Niacin-Simvastatin Er] Rash  . Zocor [Simvastatin] Rash    Social History:    Social History   Socioeconomic History  . Marital status: Married    Spouse name: Not on file  . Number of children: Not on file  . Years of education: Not on file  . Highest education level: Not on file  Occupational  History  . Occupation: retired Publishing rights managertruck driver    Employer: [employer]  Social Needs  . Financial resource strain: Not on file  . Food insecurity:    Worry: Not on file    Inability: Not on file  . Transportation needs:    Medical: Not on file    Non-medical: Not on file  Tobacco Use  . Smoking status: Former Smoker    Packs/day: 2.00    Years: 45.00    Pack years: 90.00    Types: Cigarettes    Last attempt to quit: 02/04/2010    Years since quitting: 8.6  . Smokeless tobacco: Never Used  Substance and Sexual Activity  . Alcohol use: No    Comment: QUIT 1994  . Drug use: No  . Sexual activity: Not on file  Lifestyle  . Physical activity:    Days per week: Not on file    Minutes per session: Not on file  . Stress: Not on file  Relationships  . Social connections:    Talks on phone: Not on file    Gets together: Not on file    Attends religious service: Not on file    Active member of club or organization: Not on file    Attends meetings of clubs or organizations: Not on file    Relationship status: Not on file  . Intimate partner violence:    Fear of current or ex partner: Not on file    Emotionally abused: Not on file    Physically abused: Not on file    Forced sexual activity: Not on file  Other Topics Concern  . Not on file  Social History Narrative  . Not on file    Family History:    Family History  Problem Relation Age of Onset  . Emphysema Mother   . Heart disease Father   . Mesothelioma Father   . Heart attack Father      ROS:  Please see the history of present illness.   All other ROS reviewed and negative.     Physical Exam/Data:   Vitals:   09/16/18 0600 09/16/18 0800 09/16/18 0900 09/16/18 1000  BP: (!) 137/54 (!) 107/53   136/60  Pulse: 78 70 80 98  Resp: (!) 23 19 20  (!) 23  Temp:  97.9 F (36.6 C)    TempSrc:  Oral    SpO2: 92% 97% 92% 94%  Weight:      Height:        Intake/Output Summary (Last 24 hours) at 09/16/2018 1509 Last data filed at 09/16/2018 0600 Gross per 24 hour  Intake 600 ml  Output 900 ml  Net -300 ml   Last 3 Weights 09/13/2018 09/12/2018 09/11/2018  Weight (lbs) 184 lb 4.9 oz 188 lb 11.4 oz 189 lb 9.5 oz  Weight (kg) 83.6 kg 85.6 kg 86 kg     Body mass index is 28.02 kg/m.  General:  Well nourished, well developed, in no acute distress. He is very hard of hearing.  He is lying fully supine in bed without any signs of respiratory difficulty at this time. HEENT: normal Neck: Unable to evaluate for JVD Vascular: unable Cardiac:  RRR on telemetry Lungs:  Full expansion visually. Speaking in short, but uninterrupted sentences Abd: does not appear distended Ext: no edema Musculoskeletal:  No deformities Skin: not cyanotic Neuro:  Very HOH Psych:  Normal affect   EKG:  The EKG was personally reviewed and demonstrates:  Sinus rhythm, LBBB,  left axis, unchamged Telemetry:  Telemetry was personally reviewed and demonstrates:  nsr  Relevant CV Studies:  ECHO 09/29/2016 - Left ventricle: The cavity size was normal. Wall thickness was   increased in a pattern of mild LVH. Systolic function was mildly   reduced. The estimated ejection fraction was in the range of 45%   to 50%. Severe hypokinesis of the midanteroseptal myocardium.   Doppler parameters are consistent with abnormal left ventricular   relaxation (grade 1 diastolic dysfunction). - Aortic valve: Valve area (Vmax): 1.93 cm^2.CATH 10/02/2016  LV end diastolic pressure is normal.  Mid LAD to Dist LAD lesion, 30 %stenosed. - at prior PTCA site.  Prox RCA old DES - 5 %stenosed.   Angiographically no obvious culprit lesion to explain the patient's symptoms. No lesion to explain abnormality on nuclear stress test as  anything suggestive of ischemia. Left Ventricle LV end diastolic pressure is normal. ~5-8 mmHg  Coronary Diagrams   Diagnostic  Dominance: Right     Laboratory Data:  Chemistry Recent Labs  Lab 09/13/18 0456 09/14/18 0252 09/15/18 0653  NA 134* 133* 132*  K 4.7 4.1 4.3  CL 96* 96* 92*  CO2 GLUCOSE 99 92 89  BUN 40* 41* 38*  CREATININE 1.27* 1.24 1.15  CALCIUM 8.2* 7.9* 8.1*  GFRNONAA 58* 59* >60  GFRAA >60 >60 >60  ANIONGAP No results for input(s): PROT, ALBUMIN, AST, ALT, ALKPHOS, BILITOT in the last 168 hours. Hematology Recent Labs  Lab 09/10/18 0254 09/12/18 0259 09/13/18 0456  WBC 5.9 7.0 8.8  RBC 4.87 5.25 5.63  HGB 13.7 14.9 16.2  HCT 42.6 46.3 49.9  MCV 87.5 88.2 88.6  MCH 28.1 28.4 28.8  MCHC 32.2 32.2 32.5  RDW 13.1 13.2 13.2  PLT 225 404* 443*   Cardiac EnzymesNo results for input(s): TROPONINI in the last 168 hours. No results for input(s): TROPIPOC in the last 168 hours.  BNPNo results for input(s): BNP, PROBNP in the last 168 hours.  DDimer No results for input(s): DDIMER in the last 168 hours.  Radiology/Studies:  Dg Chest Port 1 View  Result Date: 09/13/2018 CLINICAL DATA:  Respiratory failure, COVID-19 positive EXAM: PORTABLE CHEST 1 VIEW COMPARISON:  09/08/2018 FINDINGS: Bilateral patchy areas of airspace disease with a slight peripheral predominance similar in appearance to the prior examination most concerning for multilobar pneumonia including viral pneumonia. No pleural effusion or pneumothorax. Stable cardiomediastinal silhouette. No aggressive osseous lesion. IMPRESSION: Bilateral patchy areas of airspace disease with a slight peripheral predominance similar in appearance to the prior examination most concerning for multilobar pneumonia including viral pneumonia. Electronically Signed   By: Elige Ko   On: 09/13/2018 11:48    Assessment and Plan:   1. Ac resp failure/hypoxia: better with O2, now at 10L by [place], no  orthopnea, c/w viral pneumonia due to COVID-19. No reason to suspect CHF at this point. I would not perform a repeat echo unless there are new developments that could indicate acute MI or myocarditis and only if the echo findings would potentially alter management. 2. COVID-19 infection: he has numerous predictors of poor outcome including age >59, male gender, COPD and CAD. Prognosis is very guarded. Aggressive pulmonary toilet and recruitment efforts should be made to avoid need for intubation. He has received plaquenil, azithromycin, bronchodilators, 2 doses of tocilizumab, prone positioning has been encouraged. 3. LBBB:  This is chronic, going back at least to 2012. No particular investigation  or therapy is indicated. 4. CAD:  No recent angina, borderline troponin 0.05 is not c/w acute coronary sd, but may indicate worsened prognosis. On ASA/clopidogrel. 5. HTN: BP has been relatively low and beta blocker, isosorbide and amlodipine are on hold.      For questions or updates, please contact [facility] Please consult [email or web address] for contact info under     Signed, Thurmon Fair, MD  09/16/2018 3:09 PM

## 2018-09-17 MED ORDER — POLYETHYLENE GLYCOL 3350 17 G PO PACK
17.0000 g | PACK | Freq: Every day | ORAL | Status: DC | PRN
  Administered 2018-09-17: 17 g via ORAL
  Filled 2018-09-17: qty 1

## 2018-09-17 NOTE — Progress Notes (Signed)
Slight reduction in O2 needs. BP remains on low side.  [facility] will sign off.   Medication Recommendations:  No changes Other recommendations (labs, testing, etc):  n/a Follow up as an outpatient:  Has appt planned with Dr. Tresa Endo in September

## 2018-09-17 NOTE — Progress Notes (Signed)
PROGRESS NOTE    Matthew Evans  IFO:[id number] DOB: [birth year] DOA: 09/08/2018 PCP: Moshe Salisbury, NP   Brief Narrative: 68 y.o.malewith medical history significant forhypertension, hyperlipidemia, hypothyroidism, coronary artery disease, AAA, COPD,recently diagnosed COVID-19viralinfection, who presented to [facility] ED due to acute onset of shortness of breath with hypoxia. Onset this morning after waking up. 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 Checked his oxygen saturation which was 86%. Became concerned and came to the ED for further evaluation. Patient was diagnosed with COVID-19 viral infection 4 days ago outpatientand has been self isolating with his wife. Denies cough,chest pain or any GI symptoms. States he was feeling febrile this morningand took Tylenol. TRH asked to admit due to new onset hypoxia in the setting of covid-19 viral infection.  ED Course:Upon presentation to the ED patient hypoxic with O2 saturation 88% on room air, improved on 2 L of oxygen by nasal cannula to 92%. Chest x-ray independently reviewed revealed worsening bilateral pulmonary infiltrates. Patient admitted as observation status due to acute hypoxic respiratory failure secondary to COVID-19 viral infection.   Assessment & Plan:   Active Problems:   COVID-19 virus infection   Hypoxia   Goals of care, counseling/discussion   Acute pulmonary edema (HCC)   #1 acute hypoxic respiratory failure secondary to COVID-19.Chest x-ray 09/13/2018 bilateral patchy areas of airspace disease with slight peripheral predominance similar in appearance to prior exam most concerning for multilobar pneumonia including viral pneumonia. Hereceived a full course of Plaquenil.Continue azithromycin and MDI.He is currently on 8L of oxygenwill encourage him to prone and titrate oxygen down as he can tolerate.  Patient was on 30 L of oxygen now he is down to 8 L of oxygen.  #2 hypertensionblood  pressure soft DC Norvasc Toprol continue Lasix at lower dose.  Patient was not on Lasix prior to admission to hospital.  He probably will not need this Lasix upon discharge.  #3 hypothyroidism continue Synthroid.  #4 CAD on aspirin and Plavix  #5 hyperlipidemia continue statin.  #6 old left bundle branch block by EKG patient asymptomatic cardiology was consulted no further intervention needed at this time.  DVT prophylaxis:Lovenox Code Statusfull code  family Communication:Discussed with wife Larita Fife  disposition Plan:pending clinicalimprovement  Consultants:  PCCM     Estimated body mass index is 27.29 kg/m as calculated from the following:   Height as of this encounter: 5\' 8"  (1.727 m).   Weight as of this encounter: 81.4 kg.    Subjective: Much awake alert high spirits hungry asking for food having bowel movements prone almost all night  Objective: Vitals:   09/17/18 0800 09/17/18 0900 09/17/18 0923 09/17/18 0942  BP:   139/68   Pulse: 79 75 90 84  Resp: (!) 22 (!) 21 (!) 27 19  Temp:  98.1 F (36.7 C)    TempSrc:  Axillary    SpO2: 97% 96% 99% 100%  Weight:      Height:        Intake/Output Summary (Last 24 hours) at 09/17/2018 1049 Last data filed at 09/17/2018 0931 Gross per 24 hour  Intake 480 ml  Output 1100 ml  Net -620 ml   Filed Weights   09/12/18 0300 09/13/18 0429 09/17/18 0400  Weight: 85.6 kg 83.6 kg 81.4 kg    Examination:  General exam: Appears calm and comfortable  Respiratory system: Few scattered wheezing to auscultation. Respiratory effort normal. Cardiovascular system: S1 & S2 heard, RRR. No JVD, murmurs, rubs, gallops or clicks. No  pedal edema. Gastrointestinal system: Abdomen is nondistended, soft and nontender. No organomegaly or masses felt. Normal bowel sounds heard. Central nervous system: Alert and oriented. No focal neurological deficits. Extremities: Symmetric 5 x 5 power. Skin: No rashes, lesions or ulcers  Psychiatry: Judgement and insight appear normal. Mood & affect appropriate.     Data Reviewed: I have personally reviewed following labs and imaging studies  CBC: Recent Labs  Lab 09/12/18 0259 09/13/18 0456  WBC 7.0 8.8  HGB 14.9 16.2  HCT 46.3 49.9  MCV 88.2 88.6  PLT 404* 443*   Basic Metabolic Panel: Recent Labs  Lab 09/12/18 0259 09/13/18 0456 09/14/18 0252 09/15/18 0653  NA 132* 134* 133* 132*  K 3.7 4.7 4.1 4.3  CL 93* 96* 96* 92*  CO2 27 26 27 27   GLUCOSE 112* 99 92 89  BUN 43* 40* 41* 38*  CREATININE 1.31* 1.27* 1.24 1.15  CALCIUM 7.8* 8.2* 7.9* 8.1*  MG 2.6* 2.5*  --   --   PHOS  --  2.0*  --   --    GFR: Estimated Creatinine Clearance: 59.5 mL/min (by C-G formula based on SCr of 1.15 mg/dL). Liver Function Tests: No results for input(s): AST, ALT, ALKPHOS, BILITOT, PROT, ALBUMIN in the last 168 hours. No results for input(s): LIPASE, AMYLASE in the last 168 hours. No results for input(s): AMMONIA in the last 168 hours. Coagulation Profile: No results for input(s): INR, PROTIME in the last 168 hours. Cardiac Enzymes: No results for input(s): CKTOTAL, CKMB, CKMBINDEX, TROPONINI in the last 168 hours. BNP (last 3 results) No results for input(s): PROBNP in the last 8760 hours. HbA1C: No results for input(s): HGBA1C in the last 72 hours. CBG: No results for input(s): GLUCAP in the last 168 hours. Lipid Profile: No results for input(s): CHOL, HDL, LDLCALC, TRIG, CHOLHDL, LDLDIRECT in the last 72 hours. Thyroid Function Tests: No results for input(s): TSH, T4TOTAL, FREET4, T3FREE, THYROIDAB in the last 72 hours. Anemia Panel: No results for input(s): VITAMINB12, FOLATE, FERRITIN, TIBC, IRON, RETICCTPCT in the last 72 hours. Sepsis Labs: No results for input(s): PROCALCITON, LATICACIDVEN in the last 168 hours.  Recent Results (from the past 240 hour(s))  Culture, blood (routine x 2)     Status: None   Collection Time: 09/08/18  2:40 PM  Result Value  Ref Range Status   Specimen Description   Final    BLOOD RIGHT WRIST Performed at [facility], [address] [place], [place] [address]    Special Requests   Final    BOTTLES DRAWN AEROBIC AND ANAEROBIC Blood Culture results may not be optimal due to an excessive volume of blood received in culture bottles Performed at [facility], [address] [place], [place] [address]    Culture   Final    NO GROWTH 5 DAYS Performed at [facility], [address] [place], [place] [address]    Report Status 09/13/2018 FINAL  Final  Culture, blood (routine x 2)     Status: None   Collection Time: 09/09/18  7:03 AM  Result Value Ref Range Status   Specimen Description   Final    BLOOD LEFT ARM Performed at [facility], [address] [place], [place] [address]    Special Requests   Final    BOTTLES DRAWN AEROBIC ONLY Blood Culture adequate volume Performed at [facility], [address] [place], [place] [address]    Culture  Final    NO GROWTH 5 DAYS Performed at [facility], [address], [place], [place] [address]    Report Status 09/14/2018 FINAL  Final  MRSA PCR Screening     Status: None   Collection Time: 09/09/18 12:00 PM  Result Value Ref Range Status   MRSA by PCR NEGATIVE NEGATIVE Final    Comment:        The GeneXpert MRSA Assay (FDA approved for NASAL specimens only), is one component of a comprehensive MRSA colonization surveillance program. It is not intended to diagnose MRSA infection nor to guide or monitor treatment for MRSA infections. Performed at [facility], [address], [place], [place] [address]          Radiology Studies: No results found.      Scheduled Meds: . albuterol  1-2 puff Inhalation Q6H  . aspirin EC  81 mg Oral Daily  . azithromycin  500 mg Oral Daily  . Chlorhexidine Gluconate Cloth  6 each Topical Daily  .  cholecalciferol  1,000 Units Oral Daily  . clopidogrel  75 mg Oral Daily  . enoxaparin (LOVENOX) injection  40 mg Subcutaneous Q24H  . furosemide  20 mg Intravenous Daily  . Influenza vac split quadrivalent PF  0.5 mL Intramuscular Tomorrow-1000  . levothyroxine  175 mcg Oral Q0600  . loratadine  10 mg Oral Daily  . mouth rinse  15 mL Mouth Rinse BID  . montelukast  10 mg Oral QHS  . pneumococcal 23 valent vaccine  0.5 mL Intramuscular Tomorrow-1000  . umeclidinium-vilanterol  1 puff Inhalation Daily   Continuous Infusions:   LOS: 8 days     Alwyn Ren, MD [facility]  If 7PM-7AM, please contact night-coverage [email or web address] Password [facility] 09/17/2018, 10:49 AM

## 2018-09-18 LAB — BASIC METABOLIC PANEL
Anion gap: 9 (ref 5–15)
BUN: 27 mg/dL — ABNORMAL HIGH (ref 8–23)
CO2: 31 mmol/L (ref 22–32)
Calcium: 8.2 mg/dL — ABNORMAL LOW (ref 8.9–10.3)
Chloride: 92 mmol/L — ABNORMAL LOW (ref 98–111)
Creatinine, Ser: 1.29 mg/dL — ABNORMAL HIGH (ref 0.61–1.24)
GFR calc Af Amer: >60 mL/min (ref >60)
GFR calc non Af Amer: 57 mL/min — ABNORMAL LOW (ref >60)
Glucose, Bld: 104 mg/dL — ABNORMAL HIGH (ref 70–99)
Potassium: 4 mmol/L (ref 3.5–5.1)
Sodium: 132 mmol/L — ABNORMAL LOW (ref 135–145)

## 2018-09-18 LAB — CBC
HCT: 43.7 % (ref 39.0–52.0)
Hemoglobin: 14.1 g/dL (ref 13.0–17.0)
MCH: 28.4 pg (ref 26.0–34.0)
MCHC: 32.3 g/dL (ref 30.0–36.0)
MCV: 88.1 fL (ref 80.0–100.0)
Platelets: 408 10*3/uL — ABNORMAL HIGH (ref 150–400)
RBC: 4.96 MIL/uL (ref 4.22–5.81)
RDW: 13 % (ref 11.5–15.5)
WBC: 11.6 10*3/uL — ABNORMAL HIGH (ref 4.0–10.5)
nRBC: 0 % (ref 0.0–0.2)

## 2018-09-18 NOTE — Progress Notes (Signed)
PROGRESS NOTE    Matthew Evans  SWF:[id number] DOB: November 11, 1950 DOA: 09/08/2018 PCP: Moshe Salisbury, NP   Brief Narrative:   68 y.o.malewith medical history significant forhypertension, hyperlipidemia, hypothyroidism, coronary artery disease, AAA, COPD,recently diagnosed COVID-19viralinfection, who presented to [facility] ED due to acute onset of shortness of breath with hypoxia. Onset this morning after waking up. 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 Checked his oxygen saturation which was 86%. Became concerned and came to the ED for further evaluation. Patient was diagnosed with COVID-19 viral infection 4 days ago outpatientand has been self isolating with his wife. Denies cough,chest pain or any GI symptoms. States he was feeling febrile this morningand took Tylenol. TRH asked to admit due to new onset hypoxia in the setting of covid-19 viral infection.  ED Course:Upon presentation to the ED patient hypoxic with O2 saturation 88% on room air, improved on 2 L of oxygen by nasal cannula to 92%. Chest x-ray independently reviewed revealed worsening bilateral pulmonary infiltrates. Patient admitted as observation status due to acute hypoxic respiratory failure secondary to COVID-19 viral infection.   Subjective: -Patient denies any complaints today, reports he is feeling better, dyspnea has improved, denies any fever, cough or chills.  Assessment & Plan:   Active Problems:   COVID-19 virus infection   Hypoxia   Goals of care, counseling/discussion   Acute pulmonary edema (HCC)   acute hypoxic respiratory failure  secondary to COVID-19 infection - secondary to COVID-19.Chest x-ray 09/13/2018 bilateral patchy areas of airspace disease with slight peripheral predominance similar in appearance to prior exam most concerning for multilobar pneumonia including viral pneumonia.  -Patient was treated with Plaquenil x 5 days -Patient treated with azithromycin x 89 days  -He did receive Actemra on admission -He still on high oxygen requirements 3 L nasal cannula, we were able to wean to 4 L nasal cannula this morning, he was encouraged again today to continue take incentive spirometry, he was sure that he was appropriately, encouraged to continue with awake prone, report he is compliant with it, discussed with staff, will attempt out of bed to chair today, will consult PT minimal movement over last 9 days secondary to significant respiratory distress.  Hypertension -Blood pressure is acceptable, continue to hold Norvasc and metoprolol  -Continue with gentle diuresis with Lasix .  hypothyroidism  - continue Synthroid.  CAD  - on aspirin and Plavix  hyperlipidemia  - continue statin.  Left bundle branch block -Is chronic, he denies any chest pain, urology input appreciated  DVT prophylaxis:Lovenox Code Statusfull code  family Communication:Discussed with patient Disposition Plan: Patient remains in stepdown, will transfer to telemetry floor if he continues to improve by end of the day .   Consultants:  PCCM  Cardiology     Estimated body mass index is 27.29 kg/m as calculated from the following:   Height as of this encounter: 5\' 8"  (1.727 m).   Weight as of this encounter: 81.4 kg.     Objective: Vitals:   09/18/18 1056 09/18/18 1100 09/18/18 1130 09/18/18 1201  BP:  128/75    Pulse: 100 96 99   Resp: (!) 22 20 20    Temp:    (P) 97.6 F (36.4 C)  TempSrc:    (P) Axillary  SpO2: 91% 96% 93%   Weight:      Height:        Intake/Output Summary (Last 24 hours) at 09/18/2018 1216 Last data filed at 09/18/2018 0200 Gross per 24 hour  Intake 440 ml  Output 550 ml  Net -110 ml   Filed Weights   09/12/18 0300 09/13/18 0429 09/17/18 0400  Weight: 85.6 kg 83.6 kg 81.4 kg    Examination:  Awake Alert, Oriented X 3, No new F.N deficits, Normal affect, frail Symmetrical Chest wall movement, Good air movement  bilaterally, bibasilar Rales RRR,No Gallops,Rubs or new Murmurs, No Parasternal Heave +ve B.Sounds, Abd Soft, No tenderness, No rebound - guarding or rigidity. No Cyanosis, Clubbing or edema, No new Rash or bruise       Data Reviewed: I have personally reviewed following labs and imaging studies  CBC: Recent Labs  Lab 09/12/18 0259 09/13/18 0456 09/18/18 0332  WBC 7.0 8.8 11.6*  HGB 14.9 16.2 14.1  HCT 46.3 49.9 43.7  MCV 88.2 88.6 88.1  PLT 404* 443* 408*   Basic Metabolic Panel: Recent Labs  Lab 09/12/18 0259 09/13/18 0456 09/14/18 0252 09/15/18 0653 09/18/18 0332  NA 132* 134* 133* 132* 132*  K 3.7 4.7 4.1 4.3 4.0  CL 93* 96* 96* 92* 92*  CO2 27 26 27 27 31   GLUCOSE 112* 99 92 89 104*  BUN 43* 40* 41* 38* 27*  CREATININE 1.31* 1.27* 1.24 1.15 1.29*  CALCIUM 7.8* 8.2* 7.9* 8.1* 8.2*  MG 2.6* 2.5*  --   --   --   PHOS  --  2.0*  --   --   --    GFR: Estimated Creatinine Clearance: 53 mL/min (A) (by C-G formula based on SCr of 1.29 mg/dL (H)). Liver Function Tests: No results for input(s): AST, ALT, ALKPHOS, BILITOT, PROT, ALBUMIN in the last 168 hours. No results for input(s): LIPASE, AMYLASE in the last 168 hours. No results for input(s): AMMONIA in the last 168 hours. Coagulation Profile: No results for input(s): INR, PROTIME in the last 168 hours. Cardiac Enzymes: No results for input(s): CKTOTAL, CKMB, CKMBINDEX, TROPONINI in the last 168 hours. BNP (last 3 results) No results for input(s): PROBNP in the last 8760 hours. HbA1C: No results for input(s): HGBA1C in the last 72 hours. CBG: No results for input(s): GLUCAP in the last 168 hours. Lipid Profile: No results for input(s): CHOL, HDL, LDLCALC, TRIG, CHOLHDL, LDLDIRECT in the last 72 hours. Thyroid Function Tests: No results for input(s): TSH, T4TOTAL, FREET4, T3FREE, THYROIDAB in the last 72 hours. Anemia Panel: No results for input(s): VITAMINB12, FOLATE, FERRITIN, TIBC, IRON, RETICCTPCT in the  last 72 hours. Sepsis Labs: No results for input(s): PROCALCITON, LATICACIDVEN in the last 168 hours.  Recent Results (from the past 240 hour(s))  Culture, blood (routine x 2)     Status: None   Collection Time: 09/08/18  2:40 PM  Result Value Ref Range Status   Specimen Description   Final    BLOOD RIGHT WRIST Performed at [facility], [address] [place], [place] [address]    Special Requests   Final    BOTTLES DRAWN AEROBIC AND ANAEROBIC Blood Culture results may not be optimal due to an excessive volume of blood received in culture bottles Performed at [facility], [address] [place], [place] [address]    Culture   Final    NO GROWTH 5 DAYS Performed at [facility], [address] [place], [place] [address]    Report Status 09/13/2018 FINAL  Final  Culture, blood (routine x 2)     Status: None   Collection Time: 09/09/18  7:03 AM  Result Value Ref  Range Status   Specimen Description   Final    BLOOD LEFT ARM Performed at [facility], [address], [place], [place] [address]    Special Requests   Final    BOTTLES DRAWN AEROBIC ONLY Blood Culture adequate volume Performed at [facility], [address] [place], [place] [address]    Culture   Final    NO GROWTH 5 DAYS Performed at [facility], [address], [place], [place] [address]    Report Status 09/14/2018 FINAL  Final  MRSA PCR Screening     Status: None   Collection Time: 09/09/18 12:00 PM  Result Value Ref Range Status   MRSA by PCR NEGATIVE NEGATIVE Final    Comment:        The GeneXpert MRSA Assay (FDA approved for NASAL specimens only), is one component of a comprehensive MRSA colonization surveillance program. It is not intended to diagnose MRSA infection nor to guide or monitor treatment for MRSA infections. Performed at [facility], [address], [place], [place] [address]           Radiology Studies: No results found.      Scheduled Meds: . albuterol  1-2 puff Inhalation Q6H  . aspirin EC  81 mg Oral Daily  . Chlorhexidine Gluconate Cloth  6 each Topical Daily  . cholecalciferol  1,000 Units Oral Daily  . clopidogrel  75 mg Oral Daily  . enoxaparin (LOVENOX) injection  40 mg Subcutaneous Q24H  . furosemide  20 mg Intravenous Daily  . Influenza vac split quadrivalent PF  0.5 mL Intramuscular Tomorrow-1000  . levothyroxine  175 mcg Oral Q0600  . loratadine  10 mg Oral Daily  . mouth rinse  15 mL Mouth Rinse BID  . montelukast  10 mg Oral QHS  . pneumococcal 23 valent vaccine  0.5 mL Intramuscular Tomorrow-1000  . umeclidinium-vilanterol  1 puff Inhalation Daily   Continuous Infusions:   LOS: 9 days     Huey Bienenstock, MD [facility]  If 7PM-7AM, please contact night-coverage [email or web address] Password [facility] 09/18/2018, 12:16 PM

## 2018-09-18 NOTE — Progress Notes (Signed)
RN notified by telemetry patients HR sustaining in the 120's.  EKG performed showing ST.  Pt. BP elevated 130/90.  Pt. Asymptomatic.  Dr. Randol Kern updated on patients condition, no new orders at this time.  RN will continue to monitor.

## 2018-09-19 LAB — CBC
HCT: 42.4 % (ref 39.0–52.0)
Hemoglobin: 13.8 g/dL (ref 13.0–17.0)
MCH: 28.7 pg (ref 26.0–34.0)
MCHC: 32.5 g/dL (ref 30.0–36.0)
MCV: 88.1 fL (ref 80.0–100.0)
Platelets: 377 10*3/uL (ref 150–400)
RBC: 4.81 MIL/uL (ref 4.22–5.81)
RDW: 13 % (ref 11.5–15.5)
WBC: 10.1 10*3/uL (ref 4.0–10.5)
nRBC: 0 % (ref 0.0–0.2)

## 2018-09-19 LAB — BASIC METABOLIC PANEL
Anion gap: 9 (ref 5–15)
BUN: 26 mg/dL — ABNORMAL HIGH (ref 8–23)
CO2: 30 mmol/L (ref 22–32)
Calcium: 8.3 mg/dL — ABNORMAL LOW (ref 8.9–10.3)
Chloride: 95 mmol/L — ABNORMAL LOW (ref 98–111)
Creatinine, Ser: 1.13 mg/dL (ref 0.61–1.24)
GFR calc Af Amer: >60 mL/min (ref >60)
GFR calc non Af Amer: >60 mL/min (ref >60)
Glucose, Bld: 115 mg/dL — ABNORMAL HIGH (ref 70–99)
Potassium: 4.1 mmol/L (ref 3.5–5.1)
Sodium: 134 mmol/L — ABNORMAL LOW (ref 135–145)

## 2018-09-19 LAB — PHOSPHORUS: Phosphorus: 2.8 mg/dL (ref 2.5–4.6)

## 2018-09-19 LAB — MAGNESIUM: Magnesium: 2.2 mg/dL (ref 1.7–2.4)

## 2018-09-19 MED ORDER — ENSURE ENLIVE PO LIQD
237.0000 mL | Freq: Two times a day (BID) | ORAL | Status: DC
  Administered 2018-09-19 – 2018-09-20 (×4): 237 mL via ORAL

## 2018-09-19 MED ORDER — ALBUTEROL SULFATE HFA 108 (90 BASE) MCG/ACT IN AERS
1.0000 | INHALATION_SPRAY | Freq: Three times a day (TID) | RESPIRATORY_TRACT | Status: DC
  Administered 2018-09-20 (×2): 1 via RESPIRATORY_TRACT

## 2018-09-19 MED ORDER — ALBUTEROL SULFATE HFA 108 (90 BASE) MCG/ACT IN AERS: 2.0000 | INHALATION_SPRAY | Freq: Four times a day (QID) | RESPIRATORY_TRACT | Status: DC | PRN

## 2018-09-19 NOTE — Plan of Care (Signed)
  Problem: Clinical Measurements: Goal: Ability to maintain clinical measurements within normal limits will improve Outcome: Progressing Goal: Will remain free from infection Outcome: Progressing Goal: Diagnostic test results will improve Outcome: Progressing Goal: Respiratory complications will improve Outcome: Progressing   Problem: Activity: Goal: Risk for activity intolerance will decrease Outcome: Progressing   Problem: Elimination: Goal: Will not experience complications related to bowel motility Outcome: Progressing   Problem: Skin Integrity: Goal: Risk for impaired skin integrity will decrease Outcome: Progressing   Problem: Activity: Goal: Ability to tolerate increased activity will improve Outcome: Progressing   Problem: Respiratory: Goal: Ability to maintain adequate ventilation will improve Outcome: Progressing Goal: Ability to maintain a clear airway will improve Outcome: Progressing

## 2018-09-19 NOTE — Evaluation (Signed)
Physical Therapy Evaluation Patient Details Name: Matthew Evans MRN: [id number] DOB: 1951-03-11 Today's Date: 09/19/2018   History of Present Illness  68 y.o. male with medical history significant for hypertension, hyperlipidemia, hypothyroidism, coronary artery disease, AAA, COPD, recently diagnosed COVID-19 viral infection, who [facility] 09/09/18  due to acute onset of shortness of breath with hypoxia  Clinical Impression  The patient is eager to begin ambulating.   Pre mobility VS-on 3 L. HFNC 94%, HR 93 ambulated x 45' with sats 91 % on 3 L, HR 112 Ambulated x 20' on RA with sats 90%, then another 5420' after a seated rest break with sats at 89%.and HR 111 Patient reports no SOB and not noted to be dyspneic. Patient placed back on 3 L [place] after mobility.   Patient plans to return to home with wife( who is recovering). Patient should return to modified independent level of mobility. Pt admitted with above diagnosis. Pt currently with functional limitations due to the deficits listed below (see PT Problem List). Pt will benefit from skilled PT to increase their independence and safety with mobility to allow discharge to the venue listed below.       Follow Up Recommendations Home health PT    Equipment Recommendations  Rolling walker with 5" wheels TBA further.  Recommendations for Other Services       Precautions / Restrictions Precautions Precaution Comments: try O2 sat qualification      Mobility  Bed Mobility Overal bed mobility: Independent                Transfers Overall transfer level: Needs assistance Equipment used: Rolling walker (2 wheeled) Transfers: Sit to/from Stand Sit to Stand: Min guard         General transfer comment: patient extra cautious.  Ambulation/Gait Ambulation/Gait assistance: Min guard Gait Distance (Feet): 45 Feet Assistive device: Rolling walker (2 wheeled) Gait Pattern/deviations: Step-through pattern     General Gait  Details: patient slowly ambul;ated, states" I want to take it slow"  Stairs            Wheelchair Mobility    Modified Rankin (Stroke Patients Only)       Balance Overall balance assessment: Needs assistance   Sitting balance-Leahy Scale: Normal     Standing balance support: During functional activity;Bilateral upper extremity supported Standing balance-Leahy Scale: Fair Standing balance comment: light support                             Pertinent Vitals/Pain Pain Assessment: No/denies pain    Home Living Family/patient expects to be discharged to:: Private residence Living Arrangements: Spouse/significant other Available Help at Discharge: Family Type of Home: House Home Access: Stairs to enter Entrance Stairs-Rails: Doctor, general practiceight;Left Entrance Stairs-Number of Steps: 4   Home Equipment: None      Prior Function Level of Independence: Independent               Hand Dominance        Extremity/Trunk Assessment   Upper Extremity Assessment Upper Extremity Assessment: Overall WFL for tasks assessed    Lower Extremity Assessment Lower Extremity Assessment: Generalized weakness    Cervical / Trunk Assessment Cervical / Trunk Assessment: Normal  Communication   Communication: No difficulties  Cognition Arousal/Alertness: Awake/alert Behavior During Therapy: WFL for tasks assessed/performed Overall Cognitive Status: Within Functional Limits for tasks assessed  General Comments      Exercises     Assessment/Plan    PT Assessment Patient needs continued PT services  PT Problem List Decreased strength;Decreased activity tolerance;Decreased mobility;Decreased knowledge of precautions;Decreased safety awareness;Decreased knowledge of use of DME       PT Treatment Interventions DME instruction;Therapeutic exercise;Gait training;Functional mobility training;Therapeutic  activities;Patient/family education    PT Goals (Current goals can be found in the Care Plan section)  Acute Rehab PT Goals Patient Stated Goal: to go home tomorrow PT Goal Formulation: With patient Time For Goal Achievement: 10/03/18 Potential to Achieve Goals: Good    Frequency Min 3X/week   Barriers to discharge        Co-evaluation               AM-PAC PT "6 Clicks" Mobility  Outcome Measure Help needed turning from your back to your side while in a flat bed without using bedrails?: None Help needed moving from lying on your back to sitting on the side of a flat bed without using bedrails?: None Help needed moving to and from a bed to a chair (including a wheelchair)?: A Little Help needed standing up from a chair using your arms (e.g., wheelchair or bedside chair)?: A Little Help needed to walk in hospital room?: A Little Help needed climbing 3-5 steps with a railing? : A Lot 6 Click Score: 19    End of Session Equipment Utilized During Treatment: Gait belt;Oxygen Activity Tolerance: Patient tolerated treatment well Patient left: in chair;with call bell/phone within reach;with chair alarm set Nurse Communication: Mobility status PT Visit Diagnosis: Unsteadiness on feet (R26.81)    Time: 7867-5449 PT Time Calculation (min) (ACUTE ONLY): 51 min   Charges:   PT Evaluation $PT Eval Low Complexity: 1 Low PT Treatments $Gait Training: 23-37 mins         Blanchard Kelch PT Acute Rehabilitation Services Pager [phone] Office [phone]   Rada Hay 09/19/2018, 12:58 PM

## 2018-09-19 NOTE — Progress Notes (Signed)
PROGRESS NOTE    Matthew Essexddie R Evans  GEX:[id number] DOB: 22-Sep-1950 DOA: 09/08/2018 PCP: Moshe SalisburyKendall, Lonnie, NP   Brief Narrative:   68 y.o.malewith medical history significant forhypertension, hyperlipidemia, hypothyroidism, coronary artery disease, AAA, COPD,recently diagnosed COVID-19viralinfection, who [facility] due to acute onset of shortness of breath with hypoxia. Onset this morning after waking up. 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 Checked his oxygen saturation which was 86%. Became concerned and came to the ED for further evaluation. Patient was diagnosed with COVID-19 viral infection 4 days ago outpatientand has been self isolating with his wife. Denies cough,chest pain or any GI symptoms. States he was feeling febrile this morningand took Tylenol. [facility] asked to admit due to new onset hypoxia in the setting of covid-19 viral infection.  ED Course:Upon presentation to the ED patient hypoxic with O2 saturation 88% on room air, improved on 2 L of oxygen by nasal cannula to 92%. Chest x-ray independently reviewed revealed worsening bilateral pulmonary infiltrates. Patient admitted as observation status due to acute hypoxic respiratory failure secondary to COVID-19 viral infection.   Subjective: -Reports he feels better today, had a good night sleep, able to sit in recliner yesterday with PT , he was weaned down to 2 L yesterday daytime, back up to 4 L nighttime, this morning he is back on 3 L .  Assessment & Plan:   Active Problems:   COVID-19 virus infection   Hypoxia   Goals of care, counseling/discussion   Acute pulmonary edema (HCC)   acute hypoxic respiratory failure  secondary to COVID-19 infection - secondary to COVID-19.Chest x-ray 09/13/2018 bilateral patchy areas of airspace disease with slight peripheral predominance similar in appearance to prior exam most concerning for multilobar pneumonia including viral pneumonia.  -Patient was  treated with Plaquenil x 5 days -Patient treated with azithromycin x 8 days -He did receive Actemra on admission -Oxygen requirement has significantly improved, he is back on 4 L nasal cannula this morning, will try to wean as discussed with staff, encouraged to ambulate, out of bed to chair, seen by PT, encouraged to continue with awake prone and using incentive spirometry, respiratory status has improved, hopefully can be discharged home in 1 to 2 days, likely he will require oxygen .  Hypertension -Blood pressure is acceptable, continue to hold Norvasc and metoprolol -Continue with gentle diuresis with Lasix .  hypothyroidism  - continue Synthroid.  CAD  - on aspirin and Plavix  hyperlipidemia  - continue statin.  Left bundle branch block -Is chronic, he denies any chest pain, urology input appreciated  DVT prophylaxis:Lovenox Code Statusfull code  family Communication:Discussed with wife 09/18/2018 Disposition Plan: Plan to discharge home with home health, hopefully in 1 to 2 days  Consultants:  PCCM  Cardiology     Estimated body mass index is 27.29 kg/m as calculated from the following:   Height as of this encounter: 5\' 8"  (1.727 m).   Weight as of this encounter: 81.4 kg.     Objective: Vitals:   09/18/18 2201 09/18/18 2211 09/19/18 0254 09/19/18 1232  BP:  (!) 143/82 127/82 133/80  Pulse:  91 90 (!) 105  Resp:  (!) 26 (!) 22 18  Temp:   97.8 F (36.6 C)   TempSrc:   Oral   SpO2: 94% 93% 93% 97%  Weight:      Height:        Intake/Output Summary (Last 24 hours) at 09/19/2018 1317 Last data filed at 09/19/2018 0800  Gross per 24 hour  Intake 240 ml  Output 550 ml  Net -310 ml   Filed Weights   09/12/18 0300 09/13/18 0429 09/17/18 0400  Weight: 85.6 kg 83.6 kg 81.4 kg    Examination:  Awake Alert, Oriented X 3, No new F.N deficits, Normal affect Symmetrical Chest wall movement, Good air movement bilaterally, mild bibasilar Rales  RRR,No Gallops,Rubs or new Murmurs, No Parasternal Heave +ve B.Sounds, Abd Soft, No tenderness, No rebound - guarding or rigidity. No Cyanosis, Clubbing or edema, No new Rash or bruise         Data Reviewed: I have personally reviewed following labs and imaging studies  CBC: Recent Labs  Lab 09/13/18 0456 09/18/18 0332 09/19/18 0500  WBC 8.8 11.6* 10.1  HGB 16.2 14.1 13.8  HCT 49.9 43.7 42.4  MCV 88.6 88.1 88.1  PLT 443* 408* 377   Basic Metabolic Panel: Recent Labs  Lab 09/13/18 0456 09/14/18 0252 09/15/18 0653 09/18/18 0332 09/19/18 0500  NA 134* 133* 132* 132* 134*  K 4.7 4.1 4.3 4.0 4.1  CL 96* 96* 92* 92* 95*  CO2 26 27 27 31 30   GLUCOSE 99 92 89 104* 115*  BUN 40* 41* 38* 27* 26*  CREATININE 1.27* 1.24 1.15 1.29* 1.13  CALCIUM 8.2* 7.9* 8.1* 8.2* 8.3*  MG 2.5*  --   --   --  2.2  PHOS 2.0*  --   --   --  2.8   GFR: Estimated Creatinine Clearance: 60.5 mL/min (by C-G formula based on SCr of 1.13 mg/dL). Liver Function Tests: No results for input(s): AST, ALT, ALKPHOS, BILITOT, PROT, ALBUMIN in the last 168 hours. No results for input(s): LIPASE, AMYLASE in the last 168 hours. No results for input(s): AMMONIA in the last 168 hours. Coagulation Profile: No results for input(s): INR, PROTIME in the last 168 hours. Cardiac Enzymes: No results for input(s): CKTOTAL, CKMB, CKMBINDEX, TROPONINI in the last 168 hours. BNP (last 3 results) No results for input(s): PROBNP in the last 8760 hours. HbA1C: No results for input(s): HGBA1C in the last 72 hours. CBG: No results for input(s): GLUCAP in the last 168 hours. Lipid Profile: No results for input(s): CHOL, HDL, LDLCALC, TRIG, CHOLHDL, LDLDIRECT in the last 72 hours. Thyroid Function Tests: No results for input(s): TSH, T4TOTAL, FREET4, T3FREE, THYROIDAB in the last 72 hours. Anemia Panel: No results for input(s): VITAMINB12, FOLATE, FERRITIN, TIBC, IRON, RETICCTPCT in the last 72 hours. Sepsis Labs: No  results for input(s): PROCALCITON, LATICACIDVEN in the last 168 hours.  No results found for this or any previous visit (from the past 240 hour(s)).       Radiology Studies: No results found.      Scheduled Meds: . albuterol  1-2 puff Inhalation Q6H  . aspirin EC  81 mg Oral Daily  . Chlorhexidine Gluconate Cloth  6 each Topical Daily  . cholecalciferol  1,000 Units Oral Daily  . clopidogrel  75 mg Oral Daily  . enoxaparin (LOVENOX) injection  40 mg Subcutaneous Q24H  . feeding supplement (ENSURE ENLIVE)  237 mL Oral BID BM  . furosemide  20 mg Intravenous Daily  . Influenza vac split quadrivalent PF  0.5 mL Intramuscular Tomorrow-1000  . levothyroxine  175 mcg Oral Q0600  . loratadine  10 mg Oral Daily  . mouth rinse  15 mL Mouth Rinse BID  . montelukast  10 mg Oral QHS  . pneumococcal 23 valent vaccine  0.5 mL Intramuscular Tomorrow-1000  .  umeclidinium-vilanterol  1 puff Inhalation Daily   Continuous Infusions:   LOS: 10 days     Huey Bienenstock, MD [facility]  If 7PM-7AM, please contact night-coverage [email or web address] Password [email or web address] 09/19/2018, 1:17 PM

## 2018-09-19 NOTE — Progress Notes (Signed)
Initial Nutrition Assessment  INTERVENTION:   Provide Ensure Enlive po BID, each supplement provides 350 kcal and 20 grams of protein  NUTRITION DIAGNOSIS:   Inadequate oral intake related to acute illness as evidenced by per patient/family report.  GOAL:   Patient will meet greater than or equal to 90% of their needs  MONITOR:   PO intake, Supplement acceptance, Labs, Weight trends, I & O's  REASON FOR ASSESSMENT:   LOS, Diagnosis    ASSESSMENT:   68 y.o. male with medical history significant for hypertension, hyperlipidemia, hypothyroidism, coronary artery disease, AAA, COPD, recently diagnosed COVID-19 viral infection, who presented to [facility] ED due to acute onset of shortness of breath with hypoxia.  **RD working remotely**  Patient transferred to floor from ICU this morning. Pt has been consuming 100% of meals now that appetite has improved. RD has ordered Ensure supplements given weight loss since admission. Pt is accepting so far.  Per weight records, pt's UBW is ~210 lb. Pt weighed 198 lb at admission 4/5 (9% wt loss x 10 days, significant for time frame). Pt is now receiving dose of Lasix, will monitor weight trends. Weight loss may be fluid related but may be in combination with acute illness.  Medications: Vitamin D tablet daily, IV Lasix daily Labs reviewed: Low Na Mg/Phos WNL   NUTRITION - FOCUSED PHYSICAL EXAM:  Unable to perform per department requirements to work remotely.  Diet Order:   Diet Order            Diet Heart Room service appropriate? Yes; Fluid consistency: Thin  Diet effective now              EDUCATION NEEDS:   No education needs have been identified at this time  Skin:  Skin Assessment: Reviewed RN Assessment  Last BM:  4/14  Height:   Ht Readings from Last 1 Encounters:  09/09/18 5\' 8"  (1.727 m)    Weight:   Wt Readings from Last 1 Encounters:  09/17/18 81.4 kg    Ideal Body Weight:  70 kg  BMI:  Body mass index is  27.29 kg/m.  Estimated Nutritional Needs:   Kcal:  2000-2200  Protein:  85-95g  Fluid:  2L/day  Tilda Franco, MS, RD, LDN [facility] Inpatient Clinical Dietitian Pager: [phone] After Hours Pager: [phone]

## 2018-09-20 NOTE — Discharge Summary (Signed)
Matthew Evans, is a 68 y.o. male  DOB 17-May-1951  MRN [id number].  Admission date:  09/08/2018  Admitting Physician  Darlin Drop, DO  Discharge Date:  09/20/2018   Primary MD  Moshe Salisbury, NP  Recommendations for primary care physician for things to follow:  -Check CBC, BMP, LFTs during next visit -Was given [facility] Department of Health infection prevention recommendation for COVID-19 was instructed to follow-up, he expresses understanding.   Admission Diagnosis  Hypoxia [R09.02] COVID-19 virus infection [U07.1]   Discharge Diagnosis  Hypoxia [R09.02] COVID-19 virus infection [U07.1]    Active Problems:   COVID-19 virus infection   Hypoxia   Goals of care, counseling/discussion   Acute pulmonary edema (HCC)      Past Medical History:  Diagnosis Date  . AAA (abdominal aortic aneurysm) (HCC)    mild; doppler 08/29/12-3.4x3.7  . Arthritis    "hands, back" (09/28/2016)  . CAD (coronary artery disease)    echo 07/26/12-EF 55-60%; myoview 07/26/12-low normal wall motion with mild apical hypokinesis as well a septal wall motion consistent with LBBB  . Carotid artery occlusion   . Chronic lower back pain   . Cutaneous lupus erythematosus    [name], [name]  . Dementia (HCC)    "moderate; dx'd in ~ 2011" (09/28/2016)  . Dermatitis   . Emphysema   . GERD (gastroesophageal reflux disease)   . H/O hiatal hernia   . Headache(784.0)   . History of palpitations   . HTN (hypertension)   . Hyperlipidemia   . Hypothyroidism    HX OF RADIOACTIVE IODINE   . Memory loss    d/t mini stroke  . Mini stroke (HCC) ~ 2011  . Myocardial infarction (HCC) 09/2007   PCI to RCA  . Shortness of breath    WITH EXERTION  . Sleep apnea    sleep study 12/14/06 ahi 16.89, during rem 12.3, supine ahi 57.49; cpap 02/22/07-c-flex of 3 at 13cm  h2o; PT STATES HE DOES NOT USE HIS CPAP EVERY NIGHT  . Sleep apnea    "aien't worn mask in 3 years" (09/28/2016)  . Stroke [facility])    PT STATES NEUROLOGIST TOLD HIM HE HAD HAD MINI STROKE  . Thyroid disease    hypothyroidism    Past Surgical History:  Procedure Laterality Date  . balloon atherotomy     LAD  . BICEPS TENDON REPAIR Right 09/25/2016   "cleaned out spurs and arthritis" (09/28/2016)  . CARDIAC CATHETERIZATION  08/11/11   EF50-55%, small AAA, no signifiant stent restenosis in RCA , no restenosis of PTCA of LAD, medical therapy  . CARDIAC CATHETERIZATION  09/27/06   cutting balloon arthrotomy of LAD stenosis with dilatation of a 3.5x35mm cutting balloon  . CARDIAC CATHETERIZATION  01/07/09   50-60% progressive dz beyond the stented segment in the RCA , LAD intervention remained patent  . CORONARY ANGIOPLASTY WITH STENT PLACEMENT  09/24/06   PTCA/stent  of RCA with a 3.5x61mm Taxus DES post dilated to 3.89mm  . CORONARY ANGIOPLASTY WITH STENT PLACEMENT Right 09/2007  . ESOPHAGEAL MANOMETRY N/A 08/04/2013   Procedure: ESOPHAGEAL MANOMETRY (EM);  Surgeon: Theda Belfast, MD;  Location: [facility] ENDOSCOPY;  Service: Endoscopy;  Laterality: N/A;  . HERNIA REPAIR    . HIATAL HERNIA REPAIR  2011?  Marland Kitchen LEFT HEART CATH AND CORONARY ANGIOGRAPHY N/A 10/02/2016   Procedure: Left Heart Cath and Coronary Angiography;  Surgeon: Marykay Lex, MD;  Location: [facility] INVASIVE CV LAB;  Service: Cardiovascular;  Laterality: N/A;  . LEFT HEART CATHETERIZATION WITH CORONARY ANGIOGRAM N/A 08/11/2011   Procedure: LEFT HEART CATHETERIZATION WITH CORONARY ANGIOGRAM;  Surgeon: Lennette Bihari, MD;  Location: [facility] CATH LAB;  Service: Cardiovascular;  Laterality: N/A;  . SHOULDER SURGERY Right        History of present illness and  Hospital Course:     Kindly see H&P for history of present illness and admission details, please review complete Labs, Consult reports and Test reports for all details in brief  HPI  from the  history and physical done on the day of admission 09/08/2018  HPI: Matthew Evans is a 68 y.o. male with medical history significant for hypertension, hyperlipidemia, hypothyroidism, coronary artery disease, AAA, COPD, recently diagnosed COVID-19 viral infection, who presented to [facility] ED due to acute onset of shortness of breath with hypoxia.  Onset this morning after waking up.  Patient states he has an pulse oximeter at home.  Checked his oxygen saturation which was 86%.  Became concerned and came to the ED for further evaluation.  Patient was diagnosed with COVID-19 viral infection 4 days ago outpatient and has been self isolating with his wife.  Denies cough, chest pain or any GI symptoms.  States he was feeling febrile this morning and took Tylenol.  [facility] asked to admit due to new onset hypoxia in the setting of covid-19 viral infection.  ED Course: Upon presentation to the ED patient hypoxic with O2 saturation 88% on room air, improved on 2 L of oxygen by nasal cannula to 92%.  Chest x-ray independently reviewed revealed worsening bilateral pulmonary infiltrates.  Patient admitted as observation status due to acute hypoxic respiratory failure secondary to COVID-19 viral infection.  Hospital Course   acute hypoxic respiratory failure secondary to COVID-19 infection - secondary to COVID-19.Chest x-ray 09/13/2018 bilateral patchy areas of airspace disease with slight peripheral predominance similar in appearance to prior exam most concerning for multilobar pneumonia including viral pneumonia.  -Patient was treated with Plaquenil x 5 days -Patient treated with azithromycin x 8 days -He did receive Actemra on admission -Respiratory failure solved, no oxygen requirement on discharge, saturating 94 to 95% on room air morning  Hypertension -Continue home medication on discharge  hypothyroidism - continue Synthroid.  CAD - on aspirin and Plavix  hyperlipidemia - continue statin.  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   Specialty:  Internal Medicine Contact information: [address] [place] [address] [phone]             Discharge Instructions  and  Discharge Medications    Discharge Instructions    Discharge instructions   Complete by:  As directed    Follow with Primary MD Moshe Salisbury, NP in 14  days   Get CBC, CMP,  checked  by Primary MD next visit.    Activity: As tolerated with Full fall precautions use walker/cane & assistance as needed   Disposition Home    Diet: Heart Healthy , Carb modified , with feeding assistance and aspiration precautions.  For Heart failure patients - Check your Weight same time everyday, if you gain over 2 pounds, or you develop in leg swelling, experience more shortness of breath or chest pain, call your Primary MD immediately. Follow Cardiac Low Salt Diet and 1.5 lit/day fluid restriction.   On your next visit with your primary care physician please Get Medicines reviewed and adjusted.   Please request your Prim.MD to go over all Hospital Tests and Procedure/Radiological results at the follow up, please get all Hospital records sent to your Prim MD by signing hospital release before you go home.   If you experience worsening of your admission symptoms, develop shortness of breath, life threatening emergency, suicidal or homicidal thoughts you must seek medical attention immediately by calling 911 or calling your MD immediately  if symptoms less severe.  You Must read complete instructions/literature along with all the possible adverse reactions/side effects for all the Medicines you take and that have been prescribed to you. Take any new Medicines after you have completely understood and accpet all the possible  adverse reactions/side effects.   Do not drive, operating heavy machinery, perform activities at heights, swimming or participation in water activities or provide baby sitting services if your were admitted for syncope or siezures until you have seen by Primary MD or a Neurologist and advised to do so again.  Do not drive when taking Pain medications.    Do not take more than prescribed Pain, Sleep and Anxiety Medications  Special Instructions: If you have smoked or chewed Tobacco  in the last 2 yrs please stop smoking, stop any regular Alcohol  and or any Recreational drug use.  Wear Seat belts while driving.   Please note  You were cared for by a hospitalist during your hospital stay. If you have any questions about your discharge medications or the care you received while you were in the hospital after you are discharged, you can call the unit and asked to speak with the hospitalist on call if the hospitalist that took care of you is not available. Once you are discharged, your primary care physician will handle any further medical issues. Please note that NO REFILLS for any discharge medications will be authorized once you are discharged, as it is imperative that you return to your primary care physician (or establish a relationship with a primary care physician if you do not have one) for your aftercare needs so that they can reassess your need for medications and monitor your lab values.   Increase activity slowly   Complete by:  As directed      Allergies as of 09/20/2018      Reactions   Niaspan [niacin] Rash   Septra [sulfamethoxazole-trimethoprim] Rash   Simcor [niacin-simvastatin Er] Rash   Zocor [simvastatin] Rash      Medication List    STOP taking these medications   acetaminophen 500 MG tablet Commonly known as:  TYLENOL   amLODipine 5 MG tablet Commonly known as:  NORVASC   levofloxacin 500 MG tablet Commonly known as:  LEVAQUIN   umeclidinium-vilanterol  62.5-25 MCG/INH Aepb Commonly known as:  Anoro Ellipta     TAKE these medications   Albuterol Sulfate  108 (90 Base) MCG/ACT Aepb Commonly known as:  ProAir RespiClick Inhale 2 puffs into the lungs every 6 (six) hours as needed. What changed:  reasons to take this   aspirin 81 MG EC tablet TAKE 1 TABLET BY MOUTH EVERY DAY   clopidogrel 75 MG tablet Commonly known as:  PLAVIX TAKE 1 TABLET (75 MG TOTAL) BY MOUTH DAILY WITH BREAKFAST. What changed:    how much to take  how to take this  when to take this  additional instructions   cyclobenzaprine 10 MG tablet Commonly known as:  FLEXERIL Take 10 mg by mouth 2 (two) times daily as needed for muscle spasms.   Fish Oil 1200 MG Caps Take 1,200 mg by mouth 2 (two) times daily.   Fluticasone-Umeclidin-Vilant 100-62.5-25 MCG/INH Aepb Commonly known as:  Trelegy Ellipta Inhale 1 puff into the lungs daily.   guaiFENesin-codeine 100-10 MG/5ML syrup Take 5 mLs by mouth every 6 (six) hours as needed for cough.   hydrochlorothiazide 12.5 MG capsule Commonly known as:  MICROZIDE Take 1 capsule (12.5 mg total) by mouth every other day.   HYDROcodone-acetaminophen 7.5-325 MG tablet Commonly known as:  NORCO Take 1 tablet by mouth every 6 (six) hours as needed for moderate pain or severe pain.   isosorbide mononitrate 60 MG 24 hr tablet Commonly known as:  IMDUR Take 1.5 tablets (90 mg total) by mouth daily.   losartan 25 MG tablet Commonly known as:  COZAAR Take 25 mg by mouth every evening. What changed:  Another medication with the same name was removed. Continue taking this medication, and follow the directions you see here.   methocarbamol 500 MG tablet Commonly known as:  ROBAXIN Take 500 mg by mouth every 6 (six) hours as needed for muscle spasms.   metoprolol succinate 25 MG 24 hr tablet Commonly known as:  TOPROL-XL Take 1 tablet (25 mg total) by mouth daily.   ondansetron 4 MG disintegrating tablet Commonly  known as:  Zofran ODT Take 1 tablet (4 mg total) by mouth every 8 (eight) hours as needed. What changed:  reasons to take this   Synthroid 175 MCG tablet Generic drug:  levothyroxine Take 175 mcg by mouth daily before breakfast. What changed:  Another medication with the same name was removed. Continue taking this medication, and follow the directions you see here.            Durable Medical Equipment  (From admission, onward)         Start     Ordered   09/20/18 1059  For home use only DME 4 wheeled rolling walker with seat  Once    Question:  Patient needs a walker to treat with the following condition  Answer:  Fear for personal safety   09/20/18 1059            Diet and Activity recommendation: See Discharge Instructions above   Consults obtained -  None   Major procedures and Radiology Reports - PLEASE review detailed and final reports for all details, in brief -     Dg Chest Port 1 View  Result Date: 09/13/2018 CLINICAL DATA:  Respiratory failure, COVID-19 positive EXAM: PORTABLE CHEST 1 VIEW COMPARISON:  09/08/2018 FINDINGS: Bilateral patchy areas of airspace disease with a slight peripheral predominance similar in appearance to the prior examination most concerning for multilobar pneumonia including viral pneumonia. No pleural effusion or pneumothorax. Stable cardiomediastinal silhouette. No aggressive osseous lesion. IMPRESSION: Bilateral patchy areas of airspace disease with  a slight peripheral predominance similar in appearance to the prior examination most concerning for multilobar pneumonia including viral pneumonia. Electronically Signed   By: Elige KoHetal  Patel   On: 09/13/2018 11:48   Dg Chest Port 1 View  Result Date: 09/08/2018 CLINICAL DATA:  68 year old male with a given history of positive SARS-coV2 test, worsening oxygenation EXAM: PORTABLE CHEST 1 VIEW COMPARISON:  09/06/2018 FINDINGS: Cardiomediastinal silhouette unchanged in size and contour. Compared  to prior plain film there is worsening of bilateral, left greater than right reticulonodular opacities and developing airspace opacities. No pneumothorax. No pleural effusion. No displaced fracture. IMPRESSION: Worsening appearance of the chest x-ray, with evidence of multifocal pneumonia. Atypical pathogens including viral included in the differential. Electronically Signed   By: Gilmer MorJaime  Wagner D.O.   On: 09/08/2018 15:47   Dg Chest Portable 1 View  Result Date: 09/06/2018 CLINICAL DATA:  Fever and weakness.  COVID-19 positive. EXAM: PORTABLE CHEST 1 VIEW COMPARISON:  Chest x-ray dated September 04, 2018. FINDINGS: The heart size and mediastinal contours are within normal limits. Normal pulmonary vascularity. Chronically coarsened interstitial markings are similar to prior study. New subtle hazy opacities at both lung bases. No focal consolidation, pleural effusion, or pneumothorax. No acute osseous abnormality. Old right-sided rib fractures again noted. IMPRESSION: New subtle hazy airspace disease at both lung bases, consistent with history of atypical viral pneumonia. Electronically Signed   By: Obie DredgeWilliam T Derry M.D.   On: 09/06/2018 16:39   Dg Chest Port 1 View  Result Date: 09/04/2018 CLINICAL DATA:  68 year old male with fever. EXAM: PORTABLE CHEST 1 VIEW COMPARISON:  Chest CT dated 05/30/2018 FINDINGS: There is a mild background of emphysema. No focal consolidation, pleural effusion, or pneumothorax. Top-normal cardiac silhouette. Old right lateral rib fracture deformities. No acute osseous pathology. IMPRESSION: No active disease. Electronically Signed   By: Elgie CollardArash  Radparvar M.D.   On: 09/04/2018 22:49    Micro Results    No results found for this or any previous visit (from the past 240 hour(s)).     Today   Subjective:   Matthew Evans today has no headache,no chest abdominal pain,no new weakness tingling or numbness, feels much better wants to go home today.   Objective:   Blood pressure (!)  128/56, pulse 90, temperature (!) 97.3 F (36.3 C), temperature source Axillary, resp. rate 20, height 5\' 8"  (1.727 m), weight 81.4 kg, SpO2 95 %.   Intake/Output Summary (Last 24 hours) at 09/20/2018 1226 Last data filed at 09/20/2018 0829 Gross per 24 hour  Intake 360 ml  Output 1000 ml  Net -640 ml    Exam Awake Alert, Oriented x 3, No new F.N deficits, Normal affect Symmetrical Chest wall movement, Good air movement bilaterally, CTAB RRR,No Gallops,Rubs or new Murmurs, No Parasternal Heave +ve B.Sounds, Abd Soft, Non tender , No rebound -guarding or rigidity. No Cyanosis, Clubbing or edema, No new Rash or bruise  Data Review   CBC w Diff:  Lab Results  Component Value Date   WBC 10.1 09/19/2018   HGB 13.8 09/19/2018   HCT 42.4 09/19/2018   PLT 377 09/19/2018   LYMPHOPCT 8 09/09/2018   MONOPCT 3 09/09/2018   EOSPCT 0 09/09/2018   BASOPCT 0 09/09/2018    CMP:  Lab Results  Component Value Date   NA 134 (L) 09/19/2018   K 4.1 09/19/2018   CL 95 (L) 09/19/2018   CO2 30 09/19/2018   BUN 26 (H) 09/19/2018   CREATININE 1.13 09/19/2018  CREATININE 1.18 09/01/2016   PROT 6.5 09/08/2018   ALBUMIN 2.9 (L) 09/08/2018   BILITOT 0.8 09/08/2018   ALKPHOS 67 09/08/2018   AST 92 (H) 09/08/2018   ALT 49 (H) 09/08/2018  .   Total Time in preparing paper work, data evaluation and todays exam - 35 minutes  Huey Bienenstock M.D on 09/20/2018 at 12:26 PM  [facility]   Office  [phone]

## 2018-09-20 NOTE — Discharge Instructions (Signed)
Person Under Monitoring Name: Matthew Evans  Location: [address] [place] [address]   Infection Prevention Recommendations for Individuals Confirmed to have, or Being Evaluated for, 2019 Novel Coronavirus (COVID-19) Infection Who Receive Care at Home  Individuals who are confirmed to have, or are being evaluated for, COVID-19 should follow the prevention steps below until a healthcare provider or local or state health department says they can return to normal activities.  Stay home except to get medical care You should restrict activities outside your home, except for getting medical care. Do not go to work, school, or public areas, and do not use public transportation or taxis.  Call ahead before visiting your doctor Before your medical appointment, call the healthcare provider and tell them that you have, or are being evaluated for, COVID-19 infection. This will help the healthcare providers office take steps to keep other people from getting infected. Ask your healthcare provider to call the local or state health department.  Monitor your symptoms Seek prompt medical attention if your illness is worsening (e.g., difficulty breathing). Before going to your medical appointment, call the healthcare provider and tell them that you have, or are being evaluated for, COVID-19 infection. Ask your healthcare provider to call the local or state health department.  Wear a facemask You should wear a facemask that covers your nose and mouth when you are in the same room with other people and when you visit a healthcare provider. People who live with or visit you should also wear a facemask while they are in the same room with you.  Separate yourself from other people in your home As much as possible, you should stay in a different room from other people in your home. Also, you should use a separate bathroom, if available.  Avoid sharing household items You should not  share dishes, drinking glasses, cups, eating utensils, towels, bedding, or other items with other people in your home. After using these items, you should wash them thoroughly with soap and water.  Cover your coughs and sneezes Cover your mouth and nose with a tissue when you cough or sneeze, or you can cough or sneeze into your sleeve. Throw used tissues in a lined trash can, and immediately wash your hands with soap and water for at least 20 seconds or use an alcohol-based hand rub.  Wash your [employer] your hands often and thoroughly with soap and water for at least 20 seconds. You can use an alcohol-based hand sanitizer if soap and water are not available and if your hands are not visibly dirty. Avoid touching your eyes, nose, and mouth with unwashed hands.   Prevention Steps for Caregivers and Household Members of Individuals Confirmed to have, or Being Evaluated for, COVID-19 Infection Being Cared for in the Home  If you live with, or provide care at home for, a person confirmed to have, or being evaluated for, COVID-19 infection please follow these guidelines to prevent infection:  Follow healthcare providers instructions Make sure that you understand and can help the patient follow any healthcare provider instructions for all care.  Provide for the patients basic needs You should help the patient with basic needs in the home and provide support for getting groceries, prescriptions, and other personal needs.  Monitor the patients symptoms If they are getting sicker, call his or her medical provider and tell them that the patient has, or is being evaluated for, COVID-19 infection. This will help the healthcare providers  office take steps to keep other people from getting infected. Ask the healthcare provider to call the local or state health department.  Limit the number of people who have contact with the patient  If possible, have only one caregiver for the  patient.  Other household members should stay in another home or place of residence. If this is not possible, they should stay  in another room, or be separated from the patient as much as possible. Use a separate bathroom, if available.  Restrict visitors who do not have an essential need to be in the home.  Keep older adults, very young children, and other sick people away from the patient Keep older adults, very young children, and those who have compromised immune systems or chronic health conditions away from the patient. This includes people with chronic heart, lung, or kidney conditions, diabetes, and cancer.  Ensure good ventilation Make sure that shared spaces in the home have good air flow, such as from an air conditioner or an opened window, weather permitting.  Wash your hands often  Wash your hands often and thoroughly with soap and water for at least 20 seconds. You can use an alcohol based hand sanitizer if soap and water are not available and if your hands are not visibly dirty.  Avoid touching your eyes, nose, and mouth with unwashed hands.  Use disposable paper towels to dry your hands. If not available, use dedicated cloth towels and replace them when they become wet.  Wear a facemask and gloves  Wear a disposable facemask at all times in the room and gloves when you touch or have contact with the patients blood, body fluids, and/or secretions or excretions, such as sweat, saliva, sputum, nasal mucus, vomit, urine, or feces.  Ensure the mask fits over your nose and mouth tightly, and do not touch it during use.  Throw out disposable facemasks and gloves after using them. Do not reuse.  Wash your hands immediately after removing your facemask and gloves.  If your personal clothing becomes contaminated, carefully remove clothing and launder. Wash your hands after handling contaminated clothing.  Place all used disposable facemasks, gloves, and other waste in a lined  container before disposing them with other household waste.  Remove gloves and wash your hands immediately after handling these items.  Do not share dishes, glasses, or other household items with the patient  Avoid sharing household items. You should not share dishes, drinking glasses, cups, eating utensils, towels, bedding, or other items with a patient who is confirmed to have, or being evaluated for, COVID-19 infection.  After the person uses these items, you should wash them thoroughly with soap and water.  Wash laundry thoroughly  Immediately remove and wash clothes or bedding that have blood, body fluids, and/or secretions or excretions, such as sweat, saliva, sputum, nasal mucus, vomit, urine, or feces, on them.  Wear gloves when handling laundry from the patient.  Read and follow directions on labels of laundry or clothing items and detergent. In general, wash and dry with the warmest temperatures recommended on the label.  Clean all areas the individual has used often  Clean all touchable surfaces, such as counters, tabletops, doorknobs, bathroom fixtures, toilets, phones, keyboards, tablets, and bedside tables, every day. Also, clean any surfaces that may have blood, body fluids, and/or secretions or excretions on them.  Wear gloves when cleaning surfaces the patient has come in contact with.  Use a diluted bleach solution (e.g., dilute bleach with 1  part bleach and 10 parts water) or a household disinfectant with a label that says EPA-registered for coronaviruses. To make a bleach solution at home, add 1 tablespoon of bleach to 1 quart (4 cups) of water. For a larger supply, add  cup of bleach to 1 gallon (16 cups) of water.  Read labels of cleaning products and follow recommendations provided on product labels. Labels contain instructions for safe and effective use of the cleaning product including precautions you should take when applying the product, such as wearing gloves or  eye protection and making sure you have good ventilation during use of the product.  Remove gloves and wash hands immediately after cleaning.  Monitor yourself for signs and symptoms of illness Caregivers and household members are considered close contacts, should monitor their health, and will be asked to limit movement outside of the home to the extent possible. Follow the monitoring steps for close contacts listed on the symptom monitoring form.   ? If you have additional questions, contact your local health department or call the epidemiologist on call at [phone] (available 24/7). ? This guidance is subject to change. For the most up-to-date guidance from [facility], please refer to their website: YouBlogs.pl

## 2018-09-20 NOTE — TOC Initial Note (Signed)
Transition of Care [facility](TOC) - Initial/Assessment Note    Patient Details  Name: Matthew Evans MRN: [id number] Date of Birth: [birth year]  Transition of Care [facility](TOC) CM/SW Contact:    Geni BersMcGibboney, Jacquiline Zurcher, RN Phone Number: 09/20/2018, 3:19 PM  Clinical Narrative:                 Pt will transport home with [facility]. Pt is COVID19 positive and his wife at home COVID19 positive.   Expected Discharge Plan: Home/Self Care Barriers to Discharge: No Barriers Identified   Patient Goals and CMS Choice     Choice offered to / list presented to : Patient, Spouse  Expected Discharge Plan and Services Expected Discharge Plan: Home/Self Care   Discharge Planning Services: CM Consult   Living arrangements for the past 2 months: Single Family Home Expected Discharge Date: 09/20/18                        Prior Living Arrangements/Services Living arrangements for the past 2 months: Single Family Home Lives with:: Spouse Patient language and need for interpreter reviewed:: No Do you feel safe going back to the place where you live?: Yes               Activities of Daily Living Home Assistive Devices/Equipment: Eyeglasses, Dentures (specify type)(upper/lower) ADL Screening (condition at time of admission) Patient's cognitive ability adequate to safely complete daily activities?: Yes Is the patient deaf or have difficulty hearing?: No Does the patient have difficulty seeing, even when wearing glasses/contacts?: No Does the patient have difficulty concentrating, remembering, or making decisions?: No Patient able to express need for assistance with ADLs?: Yes Does the patient have difficulty dressing or bathing?: No Independently performs ADLs?: Yes (appropriate for developmental age) Does the patient have difficulty walking or climbing stairs?: No Weakness of Legs: None Weakness of Arms/Hands: None  Permission Sought/Granted Permission sought to share information with : Case Manager                 Emotional Assessment              Admission diagnosis:  Hypoxia [R09.02] COVID-19 virus infection [U07.1] Patient Active Problem List   Diagnosis Date Noted  . Goals of care, counseling/discussion   . Acute pulmonary edema (HCC)   . Hypoxia   . COVID-19 virus infection 09/08/2018  . Solitary pulmonary nodule 10/04/2016  . Unstable angina (HCC)   . Abnormal nuclear stress test   . Elevated troponin   . Chest pain 09/28/2016  . Post-op pain 09/28/2016  . Preoperative clearance 08/29/2016  . Left bundle branch block 11/23/2014  . Status post Nissen fundoplication 10/09/2013  . CAD (coronary artery disease) 10/29/2012  . AAA (abdominal aortic aneurysm) (HCC) 10/29/2012  . Hypertensive heart disease 10/29/2012  . Hypothyroid 10/29/2012  . Hyperlipidemia with target LDL less than 70 10/29/2012  . COPD (chronic obstructive pulmonary disease) (HCC) 10/29/2012  . 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  . Dyspnea 09/09/2012   PCP:  Moshe SalisburyKendall, Lonnie, NP Pharmacy:   [id number] [place] [place] - [address] [place] [id number] Phone: [phone] Fax: [phone]  [id number] [place] [place] - [address] [place] [address] [place] [address] [place] [address] Phone: [phone] Fax: [phone]  [facility] [place] [address] [place] [id number] Phone: [phone]  Fax: [phone]     Social Determinants of Health (SDOH) Interventions    Readmission Risk Interventions No flowsheet data found.

## 2018-09-20 NOTE — Progress Notes (Signed)
Reviewed discharge papers with patient, current medication regimen, follow up appointments, & care instructions. Patient discharged via [facility] home.

## 2018-09-20 NOTE — Progress Notes (Signed)
Physical Therapy Treatment Patient Details Name: Matthew Evans MRN: [id number] DOB: 27-Aug-1950 Today's Date: 09/20/2018    History of Present Illness 68 y.o. male with medical history significant for hypertension, hyperlipidemia, hypothyroidism, coronary artery disease, AAA, COPD, recently diagnosed COVID-19 viral infection, who presented to [facility] ED 09/09/18  due to acute onset of shortness of breath with hypoxia    PT Comments    Pt progressing well; needs RW (and ambulance transport home--Per pt, wife too sick to pick up ) Pt SpO2=88% to 94% on RA during PT session  Follow Up Recommendations  No PT follow up(vs HHPT if available)     Equipment Recommendations  Rolling walker with 5" wheels    Recommendations for Other Services       Precautions / Restrictions Restrictions Weight Bearing Restrictions: No    Mobility  Bed Mobility Overal bed mobility: Independent                Transfers Overall transfer level: Needs assistance Equipment used: Rolling walker (2 wheeled) Transfers: Sit to/from Stand Sit to Stand: Supervision         General transfer comment: cues for hand placement and overall safety; no physical assist  Ambulation/Gait Ambulation/Gait assistance: Min guard;Supervision Gait Distance (Feet): 25 Feet(x2) Assistive device: Rolling walker (2 wheeled) Gait Pattern/deviations: Step-through pattern;Decreased stride length     General Gait Details: slow but steady gait; pt felt he needed rest in between distance as he did not want to "over do it"   Stairs             Wheelchair Mobility    Modified Rankin (Stroke Patients Only)       Balance     Sitting balance-Leahy Scale: Normal     Standing balance support: During functional activity;Bilateral upper extremity supported Standing balance-Leahy Scale: Fair Standing balance comment: light UE support                             Cognition Arousal/Alertness:  Awake/alert Behavior During Therapy: WFL for tasks assessed/performed Overall Cognitive Status: Within Functional Limits for tasks assessed                                        Exercises      General Comments General comments (skin integrity, edema, etc.): encouraged IS, reviewed importance of amb/mobilizing ~ every hour once home      Pertinent Vitals/Pain Pain Assessment: No/denies pain    Home Living                      Prior Function            PT Goals (current goals can now be found in the care plan section) Acute Rehab PT Goals Patient Stated Goal: to go home tomorrow PT Goal Formulation: With patient Time For Goal Achievement: 10/03/18 Potential to Achieve Goals: Good Progress towards PT goals: Progressing toward goals    Frequency    Min 3X/week      PT Plan Current plan remains appropriate    Co-evaluation              AM-PAC PT "6 Clicks" Mobility   Outcome Measure  Help needed turning from your back to your side while in a flat bed without using bedrails?: None Help needed moving from lying on  your back to sitting on the side of a flat bed without using bedrails?: None Help needed moving to and from a bed to a chair (including a wheelchair)?: A Little Help needed standing up from a chair using your arms (e.g., wheelchair or bedside chair)?: A Little Help needed to walk in hospital room?: A Little Help needed climbing 3-5 steps with a railing? : A Little 6 Click Score: 20    End of Session Equipment Utilized During Treatment: Gait belt;Oxygen Activity Tolerance: Patient tolerated treatment well Patient left: in bed;with call bell/phone within reach(reviewed to call for help; bed alarm off for pt to stand EOB) Nurse Communication: Mobility status;Other (comment)(alarm off for pt to stand and use urinal) PT Visit Diagnosis: Unsteadiness on feet (R26.81)     Time: 1040-1104 PT Time Calculation (min) (ACUTE ONLY):  24 min  Charges:  $Gait Training: 23-37 mins                     Drucilla Chaletara Tiasha Helvie, PT  Pager: [phone] Acute Rehab Dept [facility] [place]([facility]): [phone]   09/20/2018    [facility] LLCWILLIAMS,Ayisha Pol 09/20/2018, 11:55 AM

## 2018-09-23 ENCOUNTER — Other Ambulatory Visit: Payer: Self-pay | Admitting: *Deleted

## 2018-09-23 NOTE — Patient Outreach (Addendum)
[facility]) Care Management  09/23/2018  Matthew Evans 1951/02/21 [id number]   EMMI-general discharge [facility] 4W   RED ON EMMI ALERT Day # 1 Date: 09/22/18 Red Alert Reason: Scheduled follow-up? No  Insurance:  blue cross and blue shield, medicare   [facility] admissions x 1 ED visits x in the last 6 months  Last admission 09/08/18 to 09/20/18   Outreach attempt # 1 Patient is able to verify HIPAA [facility] Care Management RN reviewed and addressed red alert with patient   EMMI: Mr Frischman informs CM he has made his f/u appointment to see Dr Penni Bombard in 2 weeks He states he is doing well and is not in need of anything at this time. He states he is at home with his wife who had been sick herself but we are better. He reports just needing to "get my strength back" He reports his faith in God. He was to encouraged to call CM, 24 hour nurse call center or MD prn He states he will and voices appreciation for the f/u call    Social: lives with wife Denies concerns with his care needs and transportation. He is independent with his care needs    Conditions: COVID 19 virus infection, hypoxia, acute pulmonary edema, AAA, arthritis, CAD, carotid, artery occlusion, chronic lower back pain, dementia, GERD, hx of hiatal hernia, headache, HTN, HLD, hx of mini stroke, MI, sleep apnea, stroke, hypothyroidism, COPD  DME: RW  Medications: He denies concerns with taking medications as prescribed, affording medications, side effects of medications and questions about medications  Appointments: to see Moshe Salisbury in 2 weeks    Advance Directives: Denies need for assist with advance directives    Consent: [facility] RN CM reviewed [facility] services with patient. Patient gave verbal consent for services [facility] telephonic RN CM.   Advised patient that there will be further automated EMMI- post discharge calls to assess how the patient is doing following the recent hospitalization Advised the patient that another  call may be received from a nurse if any of their responses were abnormal. Patient voiced understanding and was appreciative of f/u call.   Plan: [facility] RN CM will close case at this time as patient has been assessed and no needs identified/needs resolved.   Pt encouraged to return a call to [facility] RN CM prn  [facility] RN CM sent a successful outreach letter as discussed with [facility] brochure enclosed for review  Routed to MD   Cala Bradford L. Noelle Penner, RN, BSN, CCM [facility] Telephonic Care Management Care Coordinator Office number [phone] Mobile number [phone]  Main [facility] number [phone] Fax number [phone]

## 2018-10-15 ENCOUNTER — Emergency Department (HOSPITAL_COMMUNITY): Payer: BLUE CROSS/BLUE SHIELD

## 2018-10-15 ENCOUNTER — Encounter (HOSPITAL_COMMUNITY): Payer: Self-pay | Admitting: *Deleted

## 2018-10-15 ENCOUNTER — Other Ambulatory Visit: Payer: Self-pay

## 2018-10-15 ENCOUNTER — Emergency Department (HOSPITAL_COMMUNITY)
Admission: EM | Admit: 2018-10-15 | Discharge: 2018-10-15 | Disposition: A | Discharge status: Home / Self Care | Payer: BLUE CROSS/BLUE SHIELD | Attending: Emergency Medicine | Admitting: Emergency Medicine

## 2018-10-15 ENCOUNTER — Telehealth: Payer: Self-pay | Admitting: Pulmonary Disease

## 2018-10-15 DIAGNOSIS — Z8709 Personal history of other diseases of the respiratory system: Secondary | ICD-10-CM | POA: Insufficient documentation

## 2018-10-15 DIAGNOSIS — I2510 Atherosclerotic heart disease of native coronary artery without angina pectoris: Secondary | ICD-10-CM | POA: Insufficient documentation

## 2018-10-15 DIAGNOSIS — R0602 Shortness of breath: Secondary | ICD-10-CM

## 2018-10-15 DIAGNOSIS — J449 Chronic obstructive pulmonary disease, unspecified: Secondary | ICD-10-CM | POA: Insufficient documentation

## 2018-10-15 DIAGNOSIS — I10 Essential (primary) hypertension: Secondary | ICD-10-CM | POA: Insufficient documentation

## 2018-10-15 DIAGNOSIS — Z7982 Long term (current) use of aspirin: Secondary | ICD-10-CM | POA: Diagnosis not present

## 2018-10-15 DIAGNOSIS — R0902 Hypoxemia: Secondary | ICD-10-CM | POA: Diagnosis not present

## 2018-10-15 DIAGNOSIS — Z79899 Other long term (current) drug therapy: Secondary | ICD-10-CM | POA: Diagnosis not present

## 2018-10-15 DIAGNOSIS — R609 Edema, unspecified: Secondary | ICD-10-CM | POA: Diagnosis not present

## 2018-10-15 DIAGNOSIS — Z87891 Personal history of nicotine dependence: Secondary | ICD-10-CM | POA: Insufficient documentation

## 2018-10-15 DIAGNOSIS — E039 Hypothyroidism, unspecified: Secondary | ICD-10-CM | POA: Diagnosis not present

## 2018-10-15 LAB — BASIC METABOLIC PANEL
Anion gap: 7 (ref 5–15)
BUN: 14 mg/dL (ref 8–23)
CO2: 26 mmol/L (ref 22–32)
Calcium: 8.9 mg/dL (ref 8.9–10.3)
Chloride: 107 mmol/L (ref 98–111)
Creatinine, Ser: 1.18 mg/dL (ref 0.61–1.24)
GFR calc Af Amer: >60 mL/min (ref >60)
GFR calc non Af Amer: >60 mL/min (ref >60)
Glucose, Bld: 103 mg/dL — ABNORMAL HIGH (ref 70–99)
Potassium: 4.6 mmol/L (ref 3.5–5.1)
Sodium: 140 mmol/L (ref 135–145)

## 2018-10-15 LAB — CBC WITH DIFFERENTIAL/PLATELET
Abs Immature Granulocytes: 0.06 10*3/uL (ref 0.00–0.07)
Basophils Absolute: 0.1 10*3/uL (ref 0.0–0.1)
Basophils Relative: 1 %
Eosinophils Absolute: 0.1 10*3/uL (ref 0.0–0.5)
Eosinophils Relative: 1 %
HCT: 45.1 % (ref 39.0–52.0)
Hemoglobin: 14.2 g/dL (ref 13.0–17.0)
Immature Granulocytes: 1 %
Lymphocytes Relative: 12 %
Lymphs Abs: 1.2 10*3/uL (ref 0.7–4.0)
MCH: 29.5 pg (ref 26.0–34.0)
MCHC: 31.5 g/dL (ref 30.0–36.0)
MCV: 93.6 fL (ref 80.0–100.0)
Monocytes Absolute: 0.7 10*3/uL (ref 0.1–1.0)
Monocytes Relative: 7 %
Neutro Abs: 8.4 10*3/uL — ABNORMAL HIGH (ref 1.7–7.7)
Neutrophils Relative %: 78 %
Platelets: 333 10*3/uL (ref 150–400)
RBC: 4.82 MIL/uL (ref 4.22–5.81)
RDW: 15.8 % — ABNORMAL HIGH (ref 11.5–15.5)
WBC: 10.5 10*3/uL (ref 4.0–10.5)
nRBC: 0 % (ref 0.0–0.2)

## 2018-10-15 LAB — TROPONIN I: Troponin I: <0.03 ng/mL (ref <0.03)

## 2018-10-15 MED ORDER — IOHEXOL 350 MG/ML SOLN
80.0000 mL | Freq: Once | INTRAVENOUS | Status: AC | PRN
  Administered 2018-10-15: 80 mL via INTRAVENOUS

## 2018-10-15 MED ORDER — AZITHROMYCIN 250 MG PO TABS: 250.0000 mg | ORAL_TABLET | Freq: Every day | ORAL | 0 refills | Status: DC

## 2018-10-15 NOTE — ED Triage Notes (Signed)
Pt in c/o of SOB since COVID positive treatment a month ago, denies CP, n/v/d, pt states, "my oxygen drops when I am done walking at home"., pt speaks in full questions, pt A&O x4

## 2018-10-15 NOTE — Telephone Encounter (Signed)
Pulmonary Plan of Care  Contacted by ED for Dr. Reginia Naas patient, [name].  Per ED provider, patient with desaturation during ambulation to <88% on room air. Oxygenation improved to >88% with 2L O2 on nasal cannula.   Assessment: Hx COVID-19 pneumonia Dyspnea Exertional Hypoxemia  Plan: Arrange for home supplemental oxygen. Order placed.  Message sent to Pulmonary staff Will arrange for Pulmonary follow-up this week to ensure home O2 received  Mechele Collin, M.D. [facility] [place] Pulmonary/Critical Care Medicine Pager: [phone] After hours pager: [phone]

## 2018-10-15 NOTE — ED Notes (Signed)
SATURATION QUALIFICATIONS: (This note is used to comply with regulatory documentation for home oxygen)  Patient Saturations on Room Air at Rest = 95% and HR 107  Patient Saturations on Room Air while Ambulating = 87% and HR 120  Patient Saturations on 2 Liters of oxygen while Ambulating = 99%  Please briefly explain why patient needs home oxygen: Pt having desaturations at home frequently with exertion.

## 2018-10-15 NOTE — ED Notes (Signed)
Walked pt around [place].  At 3:38pm pt o2 was 94% and hr in the 90s After first lap going into second lap pt o2 dropped to 92% at 3:40pm hr in 110s By the end of the lap at 3:43pm pt o2 was 90% and hr in the 120s Highest hr was 125s and once we returned from walk pt o2 was 88.  After sitting in bed for 1 to 2 mins pt o2 returned to 90%. Pt o2 is now 96% at 3:45pm

## 2018-10-15 NOTE — Progress Notes (Addendum)
[facility] ED TOC CM -referral oxygen and Home Health  Patient cannot receive home oxygen from ED setting. Will have ED RN recheck oxygen sat. Isidoro Donning RN CCM Case Mgmt phone [phone]  6:50 pm Mick Sell, Senior Rep Marchelle Folks, and states due to COVID 19 they will be able to deliver oxygen to ED. Notified attending. Isidoro Donning RN CCM Case Mgmt phone [phone]

## 2018-10-15 NOTE — ED Notes (Signed)
Pt transported to CT ?

## 2018-10-15 NOTE — ED Notes (Addendum)
Pt HR and o2 at rest is 107 and 95% before walking Pt Hr and walking hr is 120 and o2 is 87% pt state he normally walks more Pt hr and o2 on oxygen 2L is hr is 103 and o2 is 98 before walking with o2 Pt hr after 117 and o2 remanded 100% the whole time

## 2018-10-15 NOTE — ED Provider Notes (Signed)
[facility] EMERGENCY DEPARTMENT Provider Note   CSN: [id number] Arrival date & time: 10/15/18  1407    History   Chief Complaint Chief Complaint  Patient presents with   Shortness of Breath    HPI Matthew Evans is a 68 y.o. male.     HPI   Matthew Evans is a 68 y.o. male, with a history of AAA, GERD, dementia, COPD, MI, presenting to the ED with shortness of breath for about the past 3 weeks. Patient was admitted April 5 due to COVID-19, stayed in the ICU, did not need ventilator support, discharged April 17.  Since his discharge, he has been short of breath with exertion as well as drops in his home SPO2 down into the high 70s and low 80s.  This shortness of breath is above and beyond what he would normally have with his COPD.  He also notes a cough that has been worse than his COPD cough since the beginning of April, productive of yellow sputum especially in the morning.  Patient had a negative coronavirus test performed by his PCP May 1.  He states he was told by his PCP that he could still have cough and symptoms of shortness of breath up to 6 weeks following hospital discharge. When he contacted the office of his pulmonologist, Dr. Vassie Loll, this morning, he was advised to come to [facility] for further assessment.  Denies fever/chills, chest pain, syncope, dizziness, diaphoresis, abdominal pain, N/V/D, or any other complaints.      Past Medical History:  Diagnosis Date   AAA (abdominal aortic aneurysm) (HCC)    mild; doppler 08/29/12-3.4x3.7   Arthritis    "hands, back" (09/28/2016)   CAD (coronary artery disease)    echo 07/26/12-EF 55-60%; myoview 07/26/12-low normal wall motion with mild apical hypokinesis as well a septal wall motion consistent with LBBB   Carotid artery occlusion    Chronic lower back pain    Cutaneous lupus erythematosus    [name], [name]   Dementia (HCC)     "moderate; dx'd in ~ 2011" (09/28/2016)   Dermatitis    Emphysema    GERD (gastroesophageal reflux disease)    H/O hiatal hernia    Headache(784.0)    History of palpitations    HTN (hypertension)    Hyperlipidemia    Hypothyroidism    HX OF RADIOACTIVE IODINE    Memory loss    d/t mini stroke   Mini stroke (HCC) ~ 2011   Myocardial infarction (HCC) 09/2007   PCI to RCA   Shortness of breath    WITH EXERTION   Sleep apnea    sleep study 12/14/06 ahi 16.89, during rem 12.3, supine ahi 57.49; cpap 02/22/07-c-flex of 3 at 13cm h2o; PT STATES HE DOES NOT USE HIS CPAP EVERY NIGHT   Sleep apnea    "aien't worn mask in 3 years" (09/28/2016)   Stroke (HCC)    PT STATES NEUROLOGIST TOLD HIM HE HAD HAD MINI STROKE   Thyroid disease    hypothyroidism    Patient Active Problem List   Diagnosis Date Noted   Goals of care, counseling/discussion    Acute pulmonary edema (HCC)    Hypoxia    COVID-19 virus infection 09/08/2018   Solitary pulmonary nodule 10/04/2016   Unstable angina (HCC)    Abnormal nuclear stress test    Elevated troponin    Chest  pain 09/28/2016   Post-op pain 09/28/2016   Preoperative clearance 08/29/2016   Left bundle branch block 11/23/2014   Status post Nissen fundoplication 10/09/2013   CAD (coronary artery disease) 10/29/2012   AAA (abdominal aortic aneurysm) (HCC) 10/29/2012   Hypertensive heart disease 10/29/2012   Hypothyroid 10/29/2012   Hyperlipidemia with target LDL less than 70 10/29/2012   COPD (chronic obstructive pulmonary disease) (HCC) 10/29/2012   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   Dyspnea 09/09/2012    Past Surgical History:  Procedure Laterality Date   balloon atherotomy     LAD   BICEPS TENDON REPAIR Right 09/25/2016   "cleaned out spurs and arthritis" (09/28/2016)   CARDIAC CATHETERIZATION  08/11/11   EF50-55%, small AAA, no signifiant stent restenosis in RCA , no restenosis of PTCA of LAD,  medical therapy   CARDIAC CATHETERIZATION  09/27/06   cutting balloon arthrotomy of LAD stenosis with dilatation of a 3.5x10mm cutting balloon   CARDIAC CATHETERIZATION  01/07/09   50-60% progressive dz beyond the stented segment in the RCA , LAD intervention remained patent   CORONARY ANGIOPLASTY WITH STENT PLACEMENT  09/24/06   PTCA/stent of RCA with a 3.5x40mm Taxus DES post dilated to 3.52mm   CORONARY ANGIOPLASTY WITH STENT PLACEMENT Right 09/2007   ESOPHAGEAL MANOMETRY N/A 08/04/2013   Procedure: ESOPHAGEAL MANOMETRY (EM);  Surgeon: Theda Belfast, MD;  Location: [facility];  Service: Endoscopy;  Laterality: N/A;   HERNIA REPAIR     HIATAL HERNIA REPAIR  2011?   LEFT HEART CATH AND CORONARY ANGIOGRAPHY N/A 10/02/2016   Procedure: Left Heart Cath and Coronary Angiography;  Surgeon: Marykay Lex, MD;  Location: [facility] INVASIVE CV LAB;  Service: Cardiovascular;  Laterality: N/A;   LEFT HEART CATHETERIZATION WITH CORONARY ANGIOGRAM N/A 08/11/2011   Procedure: LEFT HEART CATHETERIZATION WITH CORONARY ANGIOGRAM;  Surgeon: Lennette Bihari, MD;  Location: [facility] CATH LAB;  Service: Cardiovascular;  Laterality: N/A;   SHOULDER SURGERY Right         Home Medications    Prior to Admission medications   Medication Sig Start Date End Date Taking? Authorizing Provider  Albuterol Sulfate (PROAIR RESPICLICK) 108 (90 Base) MCG/ACT AEPB Inhale 2 puffs into the lungs every 6 (six) hours as needed. Patient taking differently: Inhale 2 puffs into the lungs every 6 (six) hours as needed (sob and wheezing).  05/23/18   Oretha Milch, MD  aspirin 81 MG EC tablet TAKE 1 TABLET BY MOUTH EVERY DAY Patient taking differently: Take 81 mg by mouth daily.  12/03/17   Lennette Bihari, MD  azithromycin (ZITHROMAX) 250 MG tablet Take 1 tablet (250 mg total) by mouth daily. Take first 2 tablets together, then 1 every day until finished. 10/15/18   Dajour Pierpoint C, PA-C  clopidogrel (PLAVIX) 75 MG tablet TAKE 1 TABLET (75  MG TOTAL) BY MOUTH DAILY WITH BREAKFAST. Patient taking differently: Take 75 mg by mouth daily.  02/20/18   Lennette Bihari, MD  cyclobenzaprine (FLEXERIL) 10 MG tablet Take 10 mg by mouth 2 (two) times daily as needed for muscle spasms.  08/31/14   [provider]  Fluticasone-Umeclidin-Vilant (TRELEGY ELLIPTA) 100-62.5-25 MCG/INH AEPB Inhale 1 puff into the lungs daily. 06/20/18   Oretha Milch, MD  guaiFENesin-codeine 100-10 MG/5ML syrup Take 5 mLs by mouth every 6 (six) hours as needed for cough.  09/02/18   [provider]  hydrochlorothiazide (MICROZIDE) 12.5 MG capsule Take 1 capsule (12.5 mg total) by mouth every other day.  02/20/18   Lennette BihariKelly, Thomas A, MD  HYDROcodone-acetaminophen (NORCO) 7.5-325 MG tablet Take 1 tablet by mouth every 6 (six) hours as needed for moderate pain or severe pain.  08/31/18   [provider]  isosorbide mononitrate (IMDUR) 60 MG 24 hr tablet Take 1.5 tablets (90 mg total) by mouth daily. 02/20/18   Lennette BihariKelly, Thomas A, MD  losartan (COZAAR) 25 MG tablet Take 25 mg by mouth every evening.  06/16/18   [provider]  methocarbamol (ROBAXIN) 500 MG tablet Take 500 mg by mouth every 6 (six) hours as needed for muscle spasms.  05/04/18   [provider]  metoprolol succinate (TOPROL-XL) 25 MG 24 hr tablet Take 1 tablet (25 mg total) by mouth daily. 02/20/18   Lennette BihariKelly, Thomas A, MD  Omega-3 Fatty Acids (FISH OIL) 1200 MG CAPS Take 1,200 mg by mouth 2 (two) times daily.     [provider]  ondansetron (ZOFRAN ODT) 4 MG disintegrating tablet Take 1 tablet (4 mg total) by mouth every 8 (eight) hours as needed. Patient taking differently: Take 4 mg by mouth every 8 (eight) hours as needed for nausea.  09/06/18   Jacalyn LefevreHaviland, Julie, MD  SYNTHROID 175 MCG tablet Take 175 mcg by mouth daily before breakfast.  06/26/18   [provider]    Family History Family History  Problem Relation Age of Onset   Emphysema Mother    Heart  disease Father    Mesothelioma Father    Heart attack Father     Social History Social History   Tobacco Use   Smoking status: Former Smoker    Packs/day: 2.00    Years: 45.00    Pack years: 90.00    Types: Cigarettes    Last attempt to quit: 02/04/2010    Years since quitting: 8.6   Smokeless tobacco: Never Used  Substance Use Topics   Alcohol use: No    Comment: QUIT 1994   Drug use: No     Allergies   Niaspan [niacin]; Septra [sulfamethoxazole-trimethoprim]; Simcor [niacin-simvastatin er]; and Zocor [simvastatin]   Review of Systems Review of Systems  Constitutional: Negative for chills, diaphoresis and fever.  Respiratory: Positive for cough and shortness of breath.   Cardiovascular: Negative for chest pain, palpitations and leg swelling.  Gastrointestinal: Negative for abdominal pain, diarrhea, nausea and vomiting.  Neurological: Negative for syncope and weakness.  All other systems reviewed and are negative.    Physical Exam Updated Vital Signs BP (!) 154/80 (BP Location: Left Arm)    Pulse 95    Temp 97.6 F (36.4 C) (Oral)    Resp 18    SpO2 97%   Physical Exam Vitals signs and nursing note reviewed.  Constitutional:      General: He is not in acute distress.    Appearance: He is well-developed. He is not diaphoretic.  HENT:     Head: Normocephalic and atraumatic.     Mouth/Throat:     Mouth: Mucous membranes are moist.     Pharynx: Oropharynx is clear.  Eyes:     Conjunctiva/sclera: Conjunctivae normal.  Neck:     Musculoskeletal: Neck supple.  Cardiovascular:     Rate and Rhythm: Normal rate and regular rhythm.     Pulses: Normal pulses.          Radial pulses are 2+ on the right side and 2+ on the left side.       Posterior tibial pulses are 2+ on the right side  and 2+ on the left side.     Heart sounds: Normal heart sounds.     Comments: Tactile temperature in the extremities appropriate and equal bilaterally. Pulmonary:     Effort:  Pulmonary effort is normal. No respiratory distress.     Breath sounds: Normal breath sounds.  Abdominal:     Palpations: Abdomen is soft.     Tenderness: There is no abdominal tenderness. There is no guarding.  Musculoskeletal:     Right lower leg: Edema present.     Left lower leg: Edema present.     Comments: Very mild, almost indiscernible, bilateral pitting edema.  Patient states this is not new.  Lymphadenopathy:     Cervical: No cervical adenopathy.  Skin:    General: Skin is warm and dry.  Neurological:     Mental Status: He is alert.  Psychiatric:        Mood and Affect: Mood and affect normal.        Speech: Speech normal.        Behavior: Behavior normal.      ED Treatments / Results  Labs (all labs ordered are listed, but only abnormal results are displayed) Labs Reviewed  BASIC METABOLIC PANEL - Abnormal; Notable for the following components:      Result Value   Glucose, Bld 103 (*)    All other components within normal limits  CBC WITH DIFFERENTIAL/PLATELET - Abnormal; Notable for the following components:   RDW 15.8 (*)    Neutro Abs 8.4 (*)    All other components within normal limits  TROPONIN I    EKG None  ED ECG REPORT   Date: 10/15/2018  Rate: 101  Rhythm: sinus tachycardia  QRS Axis: right  Intervals: normal  ST/T Wave abnormalities: indeterminate  Conduction Disutrbances:left bundle branch block  Narrative Interpretation:   Old EKG Reviewed: unchanged  I have personally reviewed the EKG tracing and agree with the computerized printout as noted.  Radiology Dg Chest 2 View  Result Date: 10/15/2018 CLINICAL DATA:  History of COPD.  Recent hospitalization from COVID. EXAM: CHEST - 2 VIEW COMPARISON:  09/13/2018 FINDINGS: Stable cardiac silhouette. There is peripheral airspace disease in the LEFT and RIGHT lung which is similar to comparison exam. Lung bases are clear. No pleural fluid. No focal consolidation. IMPRESSION: Peripheral airspace  disease similar to radiograph 09/13/2018. Electronically Signed   By: Genevive Bi M.D.   On: 10/15/2018 15:07   Ct Angio Chest Pe W And/or Wo Contrast  Result Date: 10/15/2018 CLINICAL DATA:  Shortness of breath. Reported recent COVID-19 episode EXAM: CT ANGIOGRAPHY CHEST WITH CONTRAST TECHNIQUE: Multidetector CT imaging of the chest was performed using the standard protocol during bolus administration of intravenous contrast. Multiplanar CT image reconstructions and MIPs were obtained to evaluate the vascular anatomy. CONTRAST:  80mL OMNIPAQUE IOHEXOL 350 MG/ML SOLN COMPARISON:  Chest CT May 30, 2018; chest radiograph Oct 15, 2018 FINDINGS: Cardiovascular: There is no demonstrable pulmonary embolus. There is no thoracic aortic aneurysm dissection. The visualized great vessels are unremarkable except for mild calcification in the proximal left subclavian artery. There are foci of aortic atherosclerosis. There are scattered foci of coronary artery calcification. There is no pericardial effusion or pericardial thickening. Mediastinum/Nodes: Thyroid is essentially absent, consistent with the reported previous radioactive iodine therapy. There are scattered subcentimeter mediastinal lymph nodes. There is no evident thoracic adenopathy. No esophageal lesions are appreciable. Lungs/Pleura: There are areas of patchy fibrosis in the lung periphery bilaterally, somewhat  more on the left than on the right. There is bilateral lower lobe bronchiectatic change. There are areas of patchy atelectatic change in the lungs bilaterally. There is a small area of infiltrate in the posterior segment of the left lower lobe, likely pneumonia. There is a small amount of pleural effusion on the left tracking into the periphery of the left minor fissure. Upper Abdomen: Visualized upper abdominal structures appear unremarkable except for aortic atherosclerosis. Musculoskeletal: There is degenerative change in the lower thoracic  spine. There are no blastic or lytic bone lesions. There is no chest wall lesion. Review of the MIP images confirms the above findings. IMPRESSION: 1. No demonstrable pulmonary embolus. No thoracic aortic aneurysm or dissection. There are foci of aortic atherosclerosis as well as scattered foci of coronary artery calcification. 2. Scattered areas of peripheral fibrosis and lower lobe bronchiectatic change. Scattered areas of atelectatic change. Small area of suspected pneumonia in the posterior segment left lower lobe. Small amount of pleural fluid noted on the left. 3.  No appreciable thoracic adenopathy. 4. 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 Aortic Atherosclerosis (ICD10-I70.0). Electronically Signed   By: Bretta Bang III M.D.   On: 10/15/2018 19:01    Procedures Procedures (including critical care time)  Medications Ordered in ED Medications  iohexol (OMNIPAQUE) 350 MG/ML injection 80 mL (80 mLs Intravenous Contrast Given 10/15/18 1804)     Initial Impression / Assessment and Plan / ED Course  I have reviewed the triage vital signs and the nursing notes.  Pertinent labs & imaging results that were available during my care of the patient were reviewed by me and considered in my medical decision making (see chart for details).  Clinical Course as of Oct 14 2045  Tue Oct 15, 2018  1706 Spoke with Dr. Everardo All, pulmonologist.  States if patient is not requiring O2 at rest, he likely doesn't need admission. If patient drops to 88% or lower on room air with ambulation, home O2 at 2 L minute may be ordered. Follow-up with the pulmonology office.   [SJ]  1845 Spoke with Cathlean Cower, Case Manager. Initially, she states oxygen cannot be ordered for patient from the ED. Shortly after this conversation, I called Dr. Everardo All back and told her our predicament.  She stated she would put in an order for oxygen for the patient.   [SJ]    Clinical Course User  Index [SJ] Hanni Milford C, PA-C       Patient presents with shortness of breath on exertion. Patient is nontoxic appearing, afebrile, not tachycardic at rest, not tachypneic, not hypotensive, and is in no apparent distress.  He does have some hypoxia with ambulation, as shown below. Lab work reassuring.  On CT he had no evidence of PE, however, there was a small area in the left lung suspicious for pneumonia.  We will initiate an antibiotic.  He will follow-up in the office with pulmonology.  We were able to secure the patient supplemental oxygen upon discharge as well as home health. The patient was given instructions for home care as well as return precautions. Patient voices understanding of these instructions, accepts the plan, and is comfortable with discharge.   Findings and plan of care discussed with Rolan Bucco, MD. Dr. Fredderick Phenix personally evaluated and examined this patient.   SATURATION QUALIFICATIONS: (This note is used to comply with regulatory documentation for home oxygen)  Patient Saturations on Room Air at Rest = 95% and HR 107  Patient Saturations on  Room Air while Ambulating = 87% and HR 120  Patient Saturations on 2 Liters of oxygen while Ambulating = 99%  Please briefly explain why patient needs home oxygen: Pt having desaturations at home frequently with exertion.   Matthew Evans was evaluated in Emergency Department on 10/15/2018 for the symptoms described in the history of present illness. He was evaluated in the context of the global COVID-19 pandemic, which necessitated consideration that the patient might be at risk for infection with the SARS-CoV-2 virus that causes COVID-19. Institutional protocols and algorithms that pertain to the evaluation of patients at risk for COVID-19 are in a state of rapid change based on information released by regulatory bodies including the CDC and federal and state organizations. These policies and algorithms were followed during the  patient's care in the ED.  Final Clinical Impressions(s) / ED Diagnoses   Final diagnoses:  SOB (shortness of breath)  Hypoxia    ED Discharge Orders         Ordered    Home Health     10/15/18 1916    Face-to-face encounter (required for Medicare/Medicaid patients)    Comments:  I Anselm Pancoast certify that this patient is under my care and that I, or a nurse practitioner or physician's assistant working with me, had a face-to-face encounter that meets the physician face-to-face encounter requirements with this patient on 10/15/2018. The encounter with the patient was in whole, or in part for the following medical condition(s) which is the primary reason for home health care (List medical condition): COPD, hypoxia  Follow up with Dr. Vassie Loll, pulmonologist in the office for further instructions. Follow patient for hypoxia and COPD care.   10/15/18 1916    azithromycin (ZITHROMAX) 250 MG tablet  Daily     10/15/18 1949           Concepcion Living 10/15/18 2047    Rolan Bucco, MD 10/16/18 1103

## 2018-10-15 NOTE — Discharge Instructions (Addendum)
Follow-up with your pulmonologist.  Call to make an appointment. There was some evidence of a small pneumonia on the CT of the chest. Please take all of your antibiotics until finished!   You may develop abdominal discomfort or diarrhea from the antibiotic.  You may help offset this with probiotics which you can buy or get in yogurt. Do not eat or take the probiotics until 2 hours after your antibiotic.   Return to the ED for any worsening symptoms.

## 2018-10-15 NOTE — TOC Initial Note (Addendum)
Transition of Care [facility]) - Initial/Assessment Note    Patient Details  Name: Matthew Evans MRN: [id number] Date of Birth: [birth year]  Transition of Care [facility]) CM/SW Contact:    Elliot Cousin, RN Phone Number: 10/15/2018, 7:45 PM  Clinical Narrative:                 Contacted wife, Larita Fife to arrange [facility] and oxygen. Explained [employer] will deliver portable tank to his ED room and home oxygen concentrator to home. Offered choice for [facility], wife agreeable to [place].   7:52 pm spoke to Juleen China [phone]. States he should be there within the hour. Will notify ED RN.   Expected Discharge Plan: Home w Home Health Services Barriers to Discharge: Equipment Delay   Patient Goals and CMS Choice Patient states their goals for this hospitalization and ongoing recovery are:: be able to breathe better CMS Medicare.gov Compare Post Acute Care list provided to:: Patient Represenative (must comment)(wife, Marlis Edelson) Choice offered to / list presented to : Spouse  Expected Discharge Plan and Services Expected Discharge Plan: Home w Home Health Services   Discharge Planning Services: CM Consult Post Acute Care Choice: Home Health Living arrangements for the past 2 months: Single Family Home                 DME Arranged: Oxygen DME Agency: Patsy Lager Date DME Agency Contacted: 10/15/18 Time DME Agency Contacted: 62 Representative spoke with at DME Agency: Kelton Pillar  HH Arranged: RN HH Agency: [facility] Care Date [facility] Agency Contacted: 10/15/18 Time HH Agency Contacted: 1944 Representative spoke with at [facility] Agency: Lorenza Chick  Prior Living Arrangements/Services Living arrangements for the past 2 months: Single Family Home Lives with:: Spouse Patient language and need for interpreter reviewed:: Yes Do you feel safe going back to the place where you live?: Yes      Need for Family Participation in Patient Care: Yes (Comment) Care giver support system in place?: Yes  (comment)   Criminal Activity/Legal Involvement Pertinent to Current Situation/Hospitalization: No - Comment as needed  Activities of Daily Living      Permission Sought/Granted Permission sought to share information with : Case Manager, PCP, Other (comment) Permission granted to share information with : Yes, Verbal Permission Granted  Share Information with NAME: Arthas Vasil  Permission granted to share info w AGENCY: [facility]  Permission granted to share info w Relationship: wife  Permission granted to share info w Contact Information: [phone]  Emotional Assessment       Orientation: : Oriented to Self, Oriented to Place, Oriented to  Time, Oriented to Situation      Admission diagnosis:  SOB  Patient Active Problem List   Diagnosis Date Noted  . Goals of care, counseling/discussion   . Acute pulmonary edema (HCC)   . Hypoxia   . COVID-19 virus infection 09/08/2018  . Solitary pulmonary nodule 10/04/2016  . Unstable angina (HCC)   . Abnormal nuclear stress test   . Elevated troponin   . Chest pain 09/28/2016  . Post-op pain 09/28/2016  . Preoperative clearance 08/29/2016  . Left bundle branch block 11/23/2014  . Status post Nissen fundoplication 10/09/2013  . CAD (coronary artery disease) 10/29/2012  . AAA (abdominal aortic aneurysm) (HCC) 10/29/2012  . Hypertensive heart disease 10/29/2012  . Hypothyroid 10/29/2012  . Hyperlipidemia with target LDL less than 70 10/29/2012  . COPD (chronic obstructive pulmonary disease) (HCC) 10/29/2012  . 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  . Dyspnea  09/09/2012   PCP:  Moshe SalisburyKendall, Lonnie, NP Pharmacy:   [id number] [place] - [address] [place] [id number] Phone: [phone] Fax: [phone]  [id number] [place], [place] - [address] [place] [address] [place] [address] [place] [address] Phone: [phone] Fax: [phone]  [facility] [place] - [address] [place] [id number] Phone: [phone] Fax: [phone]     Social Determinants of Health (SDOH) Interventions    Readmission Risk Interventions No flowsheet data found.

## 2018-10-15 NOTE — Telephone Encounter (Signed)
Called and spoke with patient regarding not feeling well today Pt advised when he gets up and walks down the hall, down to mailbox or just gets up and moves around his O2 drops to 70%-76%, increase SOB, wheezing, chest pain and tightness. Pt was tested positive for covid in 09/08/2018; and is not feeling any better. Pt states that he is not getting enough air, and cannot breath well. Pt is not on O2 at this time. Advised pt with these symptoms to call EMS after our phone call and go to [facility] ED Pt agreed, and will be going to the ED today Nothing further needed.

## 2018-10-15 NOTE — Progress Notes (Signed)
SATURATION QUALIFICATIONS: (This note is used to comply with regulatory documentation for home oxygen)  Patient Saturations on Room Air at Rest = 95%  Patient Saturations on Room Air while Ambulating =87%  Patient Saturations on 2 Liters of oxygen while Ambulating = 96%  Please briefly explain why patient needs home oxygen: COPD            

## 2018-10-15 NOTE — ED Notes (Signed)
Pt's sps. Marlis Edelson [phone]. Pls contact with status update

## 2018-10-16 ENCOUNTER — Telehealth: Payer: Self-pay | Admitting: Adult Health

## 2018-10-16 NOTE — Telephone Encounter (Signed)
Received a message from Amedeo Kinsman, RN stating that the order we placed for pt to receive O2 from [employer] can be cancelled as pt was with [facility]. Aleisa stated that pt had O2 at home and received it last night.  Called and spoke to [facility] with [employer] letting her know this info and stated that we were going to cancel the order as pt had O2 from [employer] and she verbalized understanding. Also made Rupert, [facility] aware and she said that she will cancel the order. Nothing further needed.

## 2018-10-16 NOTE — Telephone Encounter (Signed)
Received a call from [facility] with [employer] stating patient would need an office walk to qualify for 02. There is another phone message to address this. This message will be closed.

## 2018-10-16 NOTE — Telephone Encounter (Signed)
Per [facility] 02 order being sent to [facility] this morning. Will hold to f/u with pt be ensure delivery.

## 2018-10-16 NOTE — Telephone Encounter (Signed)
Spoke with Almyra Free, [facility] in regards to the call we received from [place] with [employer] and Almyra Free stated to call Henderson Newcomer at [phone] in regards to this since pt did qualify for the O2 at the emergency room and to see if there was any way his sats could be accepted from the ER due to COVID going on and trying to keep pt out of the office.  Tried to call Melissa but unable to reach her. Left message for Melissa to return call.

## 2018-10-16 NOTE — Telephone Encounter (Signed)
Matthew Evans returned my call. She said she has been made aware of the situation with pt and is currently working on seeing if she is able to get pt the O2 he needs based on the ER visit. She stated she would update [place] with more info when she has it. Routing to [place] as Efraim Kaufmann stated she will notify [place].

## 2018-10-22 ENCOUNTER — Encounter: Payer: Self-pay | Admitting: Adult Health

## 2018-10-22 ENCOUNTER — Other Ambulatory Visit: Payer: Self-pay

## 2018-10-22 ENCOUNTER — Ambulatory Visit (INDEPENDENT_AMBULATORY_CARE_PROVIDER_SITE_OTHER): Payer: BLUE CROSS/BLUE SHIELD | Admitting: Adult Health

## 2018-10-22 VITALS — BP 126/78 | HR 84 | Ht 68.0 in | Wt 214.0 lb

## 2018-10-22 DIAGNOSIS — J432 Centrilobular emphysema: Secondary | ICD-10-CM | POA: Diagnosis not present

## 2018-10-22 DIAGNOSIS — U071 COVID-19: Secondary | ICD-10-CM

## 2018-10-22 DIAGNOSIS — J9611 Chronic respiratory failure with hypoxia: Secondary | ICD-10-CM | POA: Insufficient documentation

## 2018-10-22 NOTE — Progress Notes (Signed)
  ID: Matthew Evans, male    DOB: [birth year], 68 y.o.   MRN: [id number]  Chief Complaint  Patient presents with  . Follow-up    COPD , COVID 19 infection    Referring provider: Pearson Grippe, MD  HPI: 68 year old male former smoker followed for mild COPD Medical history significant for ischemic cardiomyopathy, AAA  TEST/EVENTS :  PFT's 2014: FEV1 2.04 (68%), +15% increase with BD, no restriction, normal DLCO, +airtrapping.   3/2018Spirometry shows similar lung function with FEV1 at 68%, ratio 73, FVC 69%  CT angio 09/2016 nodular scarring right upper lobe 5 mm   10/22/2018 Follow up : COPD , post hospitalization for COVID-19 Patient presents for a hospital follow-up.  Patient was hospitalized 1 month ago.  He initially presented with shortness of breath and hypoxemia.  Work-up revealed a positive COVID-19 viral infection with secondary pneumonia.  Chest x-ray showed bilateral patchy airspace disease.  He was treated with Plaquenil for 5 days, azithromycin for 8 days. He did receive Actemra .  He slowly improved and did not require oxygen at discharge.  Prior to discharge he did have a negative COVID test on 10/04/2018 . he did not require ventilator support . of note his wife was also positive for COVID-19 Since discharge patient he was doing better but got very dyspneic with activity .  Patient did return back to the emergency room on Oct 15, 2018 for increased shortness of breath. CT chest was negative for PE.  Some mild residual infiltrates were noted in the bases.  He did have some mild hypoxemia with walking at 87% on room air.  He was started on oxygen at 2 L with activity.  Since his emergency room visit 1 week ago patient is feeling better with less dyspnea. Oxygen is really helping .  Walking at home . Advancing activity . Doing better. O2 sats 97% on 2l/m   Has moderate COPD , on TRELEGY  Cough is better. Minimally productive. No fever . No chest pain ro edema. No  hemoptysis .  Eating very good. Throat is scatchy .   Does admit to eating cookies and soda middle of night   Likes to eat. Discussed healthy meals and no eating in middle of night .    Allergies  Allergen Reactions  . Niaspan [Niacin] Rash  . Septra [Sulfamethoxazole-Trimethoprim] Rash  . Simcor [Niacin-Simvastatin Er] Rash  . Zocor [Simvastatin] Rash    Immunization History  Administered Date(s) Administered  . Influenza, High Dose Seasonal PF 09/20/2018  . Pneumococcal Polysaccharide-23 09/20/2018    Past Medical History:  Diagnosis Date  . AAA (abdominal aortic aneurysm) (HCC)    mild; doppler 08/29/12-3.4x3.7  . Arthritis    "hands, back" (09/28/2016)  . CAD (coronary artery disease)    echo 07/26/12-EF 55-60%; myoview 07/26/12-low normal wall motion with mild apical hypokinesis as well a septal wall motion consistent with LBBB  . Carotid artery occlusion   . Chronic lower back pain   . Cutaneous lupus erythematosus    [name], [name]  . Dementia (HCC)    "moderate; dx'd in ~ 2011" (09/28/2016)  . Dermatitis   . Emphysema   . GERD (gastroesophageal reflux disease)   . H/O hiatal hernia   . Headache(784.0)   . History of palpitations   . HTN (hypertension)   . Hyperlipidemia   . Hypothyroidism    HX OF RADIOACTIVE  IODINE   . Memory loss    d/t mini stroke  . Mini stroke (HCC) ~ 2011  . Myocardial infarction (HCC) 09/2007   PCI to RCA  . Shortness of breath    WITH EXERTION  . Sleep apnea    sleep study 12/14/06 ahi 16.89, during rem 12.3, supine ahi 57.49; cpap 02/22/07-c-flex of 3 at 13cm h2o; PT STATES HE DOES NOT USE HIS CPAP EVERY NIGHT  . Sleep apnea    "aien't worn mask in 3 years" (09/28/2016)  . Stroke [facility])    PT STATES NEUROLOGIST TOLD HIM HE HAD HAD MINI STROKE  . Thyroid disease    hypothyroidism    Tobacco History: Social History   Tobacco Use  Smoking Status Former Smoker   . Packs/day: 2.00  . Years: 45.00  . Pack years: 90.00  . Types: Cigarettes  . Last attempt to quit: 02/04/2010  . Years since quitting: 8.7  Smokeless Tobacco Never Used   Counseling given: Not Answered   Outpatient Medications Prior to Visit  Medication Sig Dispense Refill  . Albuterol Sulfate (PROAIR RESPICLICK) 108 (90 Base) MCG/ACT AEPB Inhale 2 puffs into the lungs every 6 (six) hours as needed. (Patient taking differently: Inhale 2 puffs into the lungs every 6 (six) hours as needed (sob and wheezing). ) 1 each 5  . aspirin 81 MG EC tablet TAKE 1 TABLET BY MOUTH EVERY DAY (Patient taking differently: Take 81 mg by mouth daily. ) 90 tablet 3  . clopidogrel (PLAVIX) 75 MG tablet TAKE 1 TABLET (75 MG TOTAL) BY MOUTH DAILY WITH BREAKFAST. (Patient taking differently: Take 75 mg by mouth daily. ) 90 tablet 3  . cyclobenzaprine (FLEXERIL) 10 MG tablet Take 10 mg by mouth 2 (two) times daily as needed for muscle spasms.   0  . Fluticasone-Umeclidin-Vilant (TRELEGY ELLIPTA) 100-62.5-25 MCG/INH AEPB Inhale 1 puff into the lungs daily. 30 each 5  . guaiFENesin-codeine 100-10 MG/5ML syrup Take 5 mLs by mouth every 6 (six) hours as needed for cough.     . hydrochlorothiazide (MICROZIDE) 12.5 MG capsule Take 1 capsule (12.5 mg total) by mouth every other day. 45 capsule 3  . HYDROcodone-acetaminophen (NORCO) 7.5-325 MG tablet Take 1 tablet by mouth every 6 (six) hours as needed for moderate pain or severe pain.     . isosorbide mononitrate (IMDUR) 60 MG 24 hr tablet Take 1.5 tablets (90 mg total) by mouth daily. 135 tablet 3  . losartan (COZAAR) 25 MG tablet Take 25 mg by mouth every evening.     . methocarbamol (ROBAXIN) 500 MG tablet Take 500 mg by mouth every 6 (six) hours as needed for muscle spasms.     . metoprolol succinate (TOPROL-XL) 25 MG 24 hr tablet Take 1 tablet (25 mg total) by mouth daily. 90 tablet 3  . Omega-3 Fatty Acids (FISH OIL) 1200 MG CAPS Take 1,200 mg by mouth 2 (two) times  daily.     . ondansetron (ZOFRAN ODT) 4 MG disintegrating tablet Take 1 tablet (4 mg total) by mouth every 8 (eight) hours as needed. (Patient taking differently: Take 4 mg by mouth every 8 (eight) hours as needed for nausea. ) 10 tablet 0  . SYNTHROID 175 MCG tablet Take 175 mcg by mouth daily before breakfast.     . azithromycin (ZITHROMAX) 250 MG tablet Take 1 tablet (250 mg total) by mouth daily. Take first 2 tablets together, then 1 every day until finished. 6 tablet 0   No  facility-administered medications prior to visit.      Review of Systems:   Constitutional:   No  weight loss, night sweats,  Fevers, chills,  +fatigue, or  lassitude.  HEENT:   No headaches,  Difficulty swallowing,  Tooth/dental problems, or  Sore throat,                No sneezing, itching, ear ache,  +nasal congestion, post nasal drip,   CV:  No chest pain,  Orthopnea, PND, swelling in lower extremities, anasarca, dizziness, palpitations, syncope.   GI  No heartburn, indigestion, abdominal pain, nausea, vomiting, diarrhea, change in bowel habits, loss of appetite, bloody stools.   Resp:    No chest wall deformity  Skin: no rash or lesions.  GU: no dysuria, change in color of urine, no urgency or frequency.  No flank pain, no hematuria   MS:  No joint pain or swelling.  No decreased range of motion.  No back pain.    Physical Exam  BP 126/78 (BP Location: Left Arm, Cuff Size: Normal)   Pulse 84   Ht 5\' 8"  (1.727 m)   Wt 214 lb (97.1 kg)   SpO2 97%   BMI 32.54 kg/m   GEN: A/Ox3; pleasant , NAD, elderly , overweight    HEENT:  [place]/AT,  EACs-clear, TMs-wnl, NOSE-clear, THROAT-clear, no lesions, no postnasal drip or exudate noted.   NECK:  Supple w/ fair ROM; no JVD; normal carotid impulses w/o bruits; no thyromegaly or nodules palpated; no lymphadenopathy.    RESP  Decreased BS in bases , no wheezing  no accessory muscle use, no dullness to percussion  CARD:  RRR, no m/r/g, tr  peripheral  edema, pulses intact, no cyanosis or clubbing.  GI:   Soft & nt; nml bowel sounds; no organomegaly or masses detected.   Musco: Warm bil, no deformities or joint swelling noted.   Neuro: alert, no focal deficits noted.    Skin: Warm, no lesions or rashes    Lab Results:  CBC    Component Value Date/Time   WBC 10.5 10/15/2018 1634   RBC 4.82 10/15/2018 1634   HGB 14.2 10/15/2018 1634   HCT 45.1 10/15/2018 1634   PLT 333 10/15/2018 1634   MCV 93.6 10/15/2018 1634   MCH 29.5 10/15/2018 1634   MCHC 31.5 10/15/2018 1634   RDW 15.8 (H) 10/15/2018 1634   LYMPHSABS 1.2 10/15/2018 1634   MONOABS 0.7 10/15/2018 1634   EOSABS 0.1 10/15/2018 1634   BASOSABS 0.1 10/15/2018 1634    BMET    Component Value Date/Time   NA 140 10/15/2018 1634   K 4.6 10/15/2018 1634   CL 107 10/15/2018 1634   CO2 26 10/15/2018 1634   GLUCOSE 103 (H) 10/15/2018 1634   BUN 14 10/15/2018 1634   CREATININE 1.18 10/15/2018 1634   CREATININE 1.18 09/01/2016 0926   CALCIUM 8.9 10/15/2018 1634   GFRNONAA >60 10/15/2018 1634   GFRAA >60 10/15/2018 1634    BNP    Component Value Date/Time   BNP 86.7 09/08/2018 1140    ProBNP    Component Value Date/Time   PROBNP 20.0 11/03/2013 1737    Imaging: Dg Chest 2 View  Result Date: 10/15/2018 CLINICAL DATA:  History of COPD.  Recent hospitalization from COVID. EXAM: CHEST - 2 VIEW COMPARISON:  09/13/2018 FINDINGS: Stable cardiac silhouette. There is peripheral airspace disease in the LEFT and RIGHT lung which is similar to comparison exam. Lung bases are clear. No pleural  fluid. No focal consolidation. IMPRESSION: Peripheral airspace disease similar to radiograph 09/13/2018. Electronically Signed   By: Genevive Bi M.D.   On: 10/15/2018 15:07   Ct Angio Chest Pe W And/or Wo Contrast  Result Date: 10/15/2018 CLINICAL DATA:  Shortness of breath. Reported recent COVID-19 episode EXAM: CT ANGIOGRAPHY CHEST WITH CONTRAST TECHNIQUE: Multidetector CT  imaging of the chest was performed using the standard protocol during bolus administration of intravenous contrast. Multiplanar CT image reconstructions and MIPs were obtained to evaluate the vascular anatomy. CONTRAST:  80mL OMNIPAQUE IOHEXOL 350 MG/ML SOLN COMPARISON:  Chest CT May 30, 2018; chest radiograph Oct 15, 2018 FINDINGS: Cardiovascular: There is no demonstrable pulmonary embolus. There is no thoracic aortic aneurysm dissection. The visualized great vessels are unremarkable except for mild calcification in the proximal left subclavian artery. There are foci of aortic atherosclerosis. There are scattered foci of coronary artery calcification. There is no pericardial effusion or pericardial thickening. Mediastinum/Nodes: Thyroid is essentially absent, consistent with the reported previous radioactive iodine therapy. There are scattered subcentimeter mediastinal lymph nodes. There is no evident thoracic adenopathy. No esophageal lesions are appreciable. Lungs/Pleura: There are areas of patchy fibrosis in the lung periphery bilaterally, somewhat more on the left than on the right. There is bilateral lower lobe bronchiectatic change. There are areas of patchy atelectatic change in the lungs bilaterally. There is a small area of infiltrate in the posterior segment of the left lower lobe, likely pneumonia. There is a small amount of pleural effusion on the left tracking into the periphery of the left minor fissure. Upper Abdomen: Visualized upper abdominal structures appear unremarkable except for aortic atherosclerosis. Musculoskeletal: There is degenerative change in the lower thoracic spine. There are no blastic or lytic bone lesions. There is no chest wall lesion. Review of the MIP images confirms the above findings. IMPRESSION: 1. No demonstrable pulmonary embolus. No thoracic aortic aneurysm or dissection. There are foci of aortic atherosclerosis as well as scattered foci of coronary artery  calcification. 2. Scattered areas of peripheral fibrosis and lower lobe bronchiectatic change. Scattered areas of atelectatic change. Small area of suspected pneumonia in the posterior segment left lower lobe. Small amount of pleural fluid noted on the left. 3.  No appreciable thoracic adenopathy. 4. 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 Aortic Atherosclerosis (ICD10-I70.0). Electronically Signed   By: Bretta Bang III M.D.   On: 10/15/2018 19:01      No flowsheet data found.  No results found for: NITRICOXIDE      Assessment & Plan:   COPD (chronic obstructive pulmonary disease) (HCC) Moderate COPD - recent decompensation with COVID /secondary PNA - slowly improving   Plan  Patient Instructions  Continue on TRELEGY 1 puff daily .  Continue on Oxygen 2l/m with activity .  Labs today .  Advance your activity as tolerated.  Follow up with Dr. Vassie Loll or [place] in 6 weeks and As needed   Please contact office for sooner follow up if symptoms do not improve or worsen or seek emergency care        Pneumonia due to COVID-19 virus Clinically improving  Check cxr on return .  Check COVID 19 antibody test today .   Plan  Patient Instructions  Continue on TRELEGY 1 puff daily .  Continue on Oxygen 2l/m with activity .  Labs today .  Advance your activity as tolerated.  Follow up with Dr. Vassie Loll or [place] in 6 weeks and As needed   Please contact  office for sooner follow up if symptoms do not improve or worsen or seek emergency care        Chronic respiratory failure with hypoxia (HCC) Exertional hypoxemia with recent COVID-19 infection, secondary pneumonia and COPD decompensation.  Patient is improved on oxygen and keeps O2 saturations are greater than 90%.  He is to advance activity as tolerated using oxygen.  He does have deconditioning and oxygen is helping him quite a bit.  He is to advance activity as tolerated.      Rubye Oaks, NP 10/22/2018

## 2018-10-22 NOTE — Assessment & Plan Note (Signed)
Exertional hypoxemia with recent COVID-19 infection, secondary pneumonia and COPD decompensation.  Patient is improved on oxygen and keeps O2 saturations are greater than 90%.  He is to advance activity as tolerated using oxygen.  He does have deconditioning and oxygen is helping him quite a bit.  He is to advance activity as tolerated.

## 2018-10-22 NOTE — Assessment & Plan Note (Signed)
Moderate COPD - recent decompensation with COVID /secondary PNA - slowly improving   Plan  Patient Instructions  Continue on TRELEGY 1 puff daily .  Continue on Oxygen 2l/m with activity .  Labs today .  Advance your activity as tolerated.  Follow up with Dr. Vassie Loll or [place] in 6 weeks and As needed   Please contact office for sooner follow up if symptoms do not improve or worsen or seek emergency care

## 2018-10-22 NOTE — Patient Instructions (Signed)
Continue on TRELEGY 1 puff daily .  Continue on Oxygen 2l/m with activity .  Labs today .  Advance your activity as tolerated.  Follow up with Dr. Vassie Loll or Tanis Burnley in 6 weeks and As needed   Please contact office for sooner follow up if symptoms do not improve or worsen or seek emergency care

## 2018-10-22 NOTE — Assessment & Plan Note (Signed)
Clinically improving  Check cxr on return .  Check COVID 19 antibody test today .   Plan  Patient Instructions  Continue on TRELEGY 1 puff daily .  Continue on Oxygen 2l/m with activity .  Labs today .  Advance your activity as tolerated.  Follow up with Dr. Vassie Loll or Darden Flemister in 6 weeks and As needed   Please contact office for sooner follow up if symptoms do not improve or worsen or seek emergency care

## 2018-10-23 LAB — SAR COV2 SEROLOGY (COVID19)AB(IGG),IA: SARS CoV2 AB IGG: POSITIVE — AB

## 2018-11-22 ENCOUNTER — Ambulatory Visit: Payer: BLUE CROSS/BLUE SHIELD | Admitting: Adult Health

## 2018-11-28 ENCOUNTER — Other Ambulatory Visit: Payer: Self-pay

## 2018-11-28 DIAGNOSIS — J1282 Pneumonia due to coronavirus disease 2019: Secondary | ICD-10-CM

## 2018-11-28 DIAGNOSIS — U071 COVID-19: Secondary | ICD-10-CM

## 2018-11-29 ENCOUNTER — Ambulatory Visit (INDEPENDENT_AMBULATORY_CARE_PROVIDER_SITE_OTHER): Payer: BC Managed Care – PPO

## 2018-11-29 ENCOUNTER — Other Ambulatory Visit: Payer: Self-pay

## 2018-11-29 ENCOUNTER — Encounter: Payer: Self-pay | Admitting: Adult Health

## 2018-11-29 ENCOUNTER — Ambulatory Visit: Payer: Medicare Other

## 2018-11-29 ENCOUNTER — Ambulatory Visit (INDEPENDENT_AMBULATORY_CARE_PROVIDER_SITE_OTHER): Payer: BC Managed Care – PPO | Admitting: Adult Health

## 2018-11-29 DIAGNOSIS — J1289 Other viral pneumonia: Secondary | ICD-10-CM

## 2018-11-29 DIAGNOSIS — U071 COVID-19: Secondary | ICD-10-CM

## 2018-11-29 DIAGNOSIS — J9611 Chronic respiratory failure with hypoxia: Secondary | ICD-10-CM | POA: Diagnosis not present

## 2018-11-29 DIAGNOSIS — J432 Centrilobular emphysema: Secondary | ICD-10-CM

## 2018-11-29 DIAGNOSIS — J1282 Pneumonia due to coronavirus disease 2019: Secondary | ICD-10-CM

## 2018-11-29 NOTE — Patient Instructions (Addendum)
Continue on TRELEGY 1 puff daily .  May use Oxygen with activity As needed   Advance your activity as tolerated.  Follow up with Dr. Elsworth Soho or Elzy Tomasello in 2-3 months and As needed   Please contact office for sooner follow up if symptoms do not improve or worsen or seek emergency care

## 2018-11-29 NOTE — Assessment & Plan Note (Signed)
May use oxygen with activity as needed

## 2018-11-29 NOTE — Assessment & Plan Note (Signed)
Clinically patient is improving.  Check chest x-ray today  Plan  Patient Instructions  Continue on TRELEGY 1 puff daily .  May use Oxygen with activity As needed   Advance your activity as tolerated.  Follow up with Dr. Elsworth Soho or Parrett in 2-3 months and As needed   Please contact office for sooner follow up if symptoms do not improve or worsen or seek emergency care

## 2018-11-29 NOTE — Progress Notes (Signed)
 @Patient  ID: Matthew Evans, male    DOB: 1950-10-30, 68 y.o.   MRN: [id number]  Chief Complaint  Patient presents with  . Follow-up    COPD , PNA     Referring provider: Moshe SalisburyKendall, Lonnie, NP  HPI: 68 year old male former smoker followed for mild COPD Medical history significant for ischemic cardiomyopathy, AAA  TEST/EVENTS :  PFT's 2014: FEV1 2.04 (68%), +15% increase with BD, no restriction, normal DLCO, +airtrapping.   3/2018Spirometry shows similar lung function with FEV1 at 68%, ratio 73, FVC 69%  CT angio 4/2018nodular scarring right upper lobe 5 mm  11/29/2018 Follow up : COPD , COVID 19 - Pneumonia  Patient presents for a 1 month follow up . Was hospitalized in April for Covid 19 viral infection with secondary PNA (bilateral aspdz on CXR ) . He was treated with Plaquenil for 5 days , Azithromycin for 8 days  And Actemra . He slowly improved . He did not require vent support. Had negative COVID 19 after discharge.  He is on oxygen with activity.  Says that his oxygen levels have been doing better.  He dropped down into the upper 80s if he has to do heavy activity.  But overall feels that he is getting stronger.  He denies any increased cough congestion.  No fever.  Appetite is improving.  Activity tolerance is starting to improve as well.  Allergies  Allergen Reactions  . Niaspan [Niacin] Rash  . Septra [Sulfamethoxazole-Trimethoprim] Rash  . Simcor [Niacin-Simvastatin Er] Rash  . Zocor [Simvastatin] Rash    Immunization History  Administered Date(s) Administered  . Influenza, High Dose Seasonal PF 09/20/2018  . Pneumococcal Polysaccharide-23 09/20/2018    Past Medical History:  Diagnosis Date  . AAA (abdominal aortic aneurysm) (HCC)    mild; doppler 08/29/12-3.4x3.7  . Arthritis    "hands, back" (09/28/2016)  . CAD (coronary artery disease)    echo 07/26/12-EF 55-60%; myoview 07/26/12-low normal wall motion with mild apical hypokinesis as well a septal wall  motion consistent with LBBB  . Carotid artery occlusion   . Chronic lower back pain   . Cutaneous lupus erythematosus    [name], [name]  . Dementia (HCC)    "moderate; dx'd in ~ 2011" (09/28/2016)  . Dermatitis   . Emphysema   . GERD (gastroesophageal reflux disease)   . H/O hiatal hernia   . Headache(784.0)   . History of palpitations   . HTN (hypertension)   . Hyperlipidemia   . Hypothyroidism    HX OF RADIOACTIVE IODINE   . Memory loss    d/t mini stroke  . Mini stroke (HCC) ~ 2011  . Myocardial infarction (HCC) 09/2007   PCI to RCA  . Shortness of breath    WITH EXERTION  . Sleep apnea    sleep study 12/14/06 ahi 16.89, during rem 12.3, supine ahi 57.49; cpap 02/22/07-c-flex of 3 at 13cm h2o; PT STATES HE DOES NOT USE HIS CPAP EVERY NIGHT  . Sleep apnea    "aien't worn mask in 3 years" (09/28/2016)  . Stroke [facility](HCC)    PT STATES NEUROLOGIST TOLD HIM HE HAD HAD MINI STROKE  . Thyroid disease    hypothyroidism    Tobacco History: Social History   Tobacco Use  Smoking Status Former Smoker  . Packs/day: 2.00  . Years: 45.00  . Pack years: 90.00  . Types: Cigarettes  . Quit date:  02/04/2010  . Years since quitting: 8.8  Smokeless Tobacco Never Used   Counseling given: Not Answered   Outpatient Medications Prior to Visit  Medication Sig Dispense Refill  . Albuterol Sulfate (PROAIR RESPICLICK) 108 (90 Base) MCG/ACT AEPB Inhale 2 puffs into the lungs every 6 (six) hours as needed. (Patient taking differently: Inhale 2 puffs into the lungs every 6 (six) hours as needed (sob and wheezing). ) 1 each 5  . aspirin 81 MG EC tablet TAKE 1 TABLET BY MOUTH EVERY DAY (Patient taking differently: Take 81 mg by mouth daily. ) 90 tablet 3  . clopidogrel (PLAVIX) 75 MG tablet TAKE 1 TABLET (75 MG TOTAL) BY MOUTH DAILY WITH BREAKFAST. (Patient taking differently: Take 75 mg by mouth daily. ) 90 tablet 3  .  cyclobenzaprine (FLEXERIL) 10 MG tablet Take 10 mg by mouth 2 (two) times daily as needed for muscle spasms.   0  . Fluticasone-Umeclidin-Vilant (TRELEGY ELLIPTA) 100-62.5-25 MCG/INH AEPB Inhale 1 puff into the lungs daily. 30 each 5  . guaiFENesin-codeine 100-10 MG/5ML syrup Take 5 mLs by mouth every 6 (six) hours as needed for cough.     . hydrochlorothiazide (MICROZIDE) 12.5 MG capsule Take 1 capsule (12.5 mg total) by mouth every other day. 45 capsule 3  . HYDROcodone-acetaminophen (NORCO) 7.5-325 MG tablet Take 1 tablet by mouth every 6 (six) hours as needed for moderate pain or severe pain.     . isosorbide mononitrate (IMDUR) 60 MG 24 hr tablet Take 1.5 tablets (90 mg total) by mouth daily. 135 tablet 3  . losartan (COZAAR) 25 MG tablet Take 25 mg by mouth every evening.     . methocarbamol (ROBAXIN) 500 MG tablet Take 500 mg by mouth every 6 (six) hours as needed for muscle spasms.     . metoprolol succinate (TOPROL-XL) 25 MG 24 hr tablet Take 1 tablet (25 mg total) by mouth daily. 90 tablet 3  . Omega-3 Fatty Acids (FISH OIL) 1200 MG CAPS Take 1,200 mg by mouth 2 (two) times daily.     . ondansetron (ZOFRAN ODT) 4 MG disintegrating tablet Take 1 tablet (4 mg total) by mouth every 8 (eight) hours as needed. (Patient taking differently: Take 4 mg by mouth every 8 (eight) hours as needed for nausea. ) 10 tablet 0  . SYNTHROID 175 MCG tablet Take 175 mcg by mouth daily before breakfast.      No facility-administered medications prior to visit.      Review of Systems:   Constitutional:   No  weight loss, night sweats,  Fevers, chills, + fatigue, or  lassitude.  HEENT:   No headaches,  Difficulty swallowing,  Tooth/dental problems, or  Sore throat,                No sneezing, itching, ear ache, nasal congestion, post nasal drip,   CV:  No chest pain,  Orthopnea, PND, swelling in lower extremities, anasarca, dizziness, palpitations, syncope.   GI  No heartburn, indigestion, abdominal  pain, nausea, vomiting, diarrhea, change in bowel habits, loss of appetite, bloody stools.   Resp No excess mucus, no productive cough,  No non-productive cough,  No coughing up of blood.  No change in color of mucus.  No wheezing.  No chest wall deformity  Skin: no rash or lesions.  GU: no dysuria, change in color of urine, no urgency or frequency.  No flank pain, no hematuria   MS:  No joint pain or swelling.  No decreased  range of motion.  No back pain.    Physical Exam  BP 118/62 (BP Location: Left Arm, Cuff Size: Normal)   Pulse (!) 59   Temp 97.6 F (36.4 C) (Oral)   Ht 5\' 9"  (1.753 m)   Wt 210 lb 6.4 oz (95.4 kg)   SpO2 98%   BMI 31.07 kg/m   GEN: A/Ox3; pleasant , NAD, elderly   HEENT:  [place]/AT,  EACs-clear, TMs-wnl, NOSE-clear, THROAT-clear, no lesions, no postnasal drip or exudate noted.   NECK:  Supple w/ fair ROM; no JVD; normal carotid impulses w/o bruits; no thyromegaly or nodules palpated; no lymphadenopathy.    RESP  Clear  P & A; w/o, wheezes/ rales/ or rhonchi. no accessory muscle use, no dullness to percussion  CARD:  RRR, no m/r/g, no peripheral edema, pulses intact, no cyanosis or clubbing.  GI:   Soft & nt; nml bowel sounds; no organomegaly or masses detected.   Musco: Warm bil, no deformities or joint swelling noted.   Neuro: alert, no focal deficits noted.    Skin: Warm, no lesions or rashes    Lab Results:  CBC    Component Value Date/Time   WBC 10.5 10/15/2018 1634   RBC 4.82 10/15/2018 1634   HGB 14.2 10/15/2018 1634   HCT 45.1 10/15/2018 1634   PLT 333 10/15/2018 1634   MCV 93.6 10/15/2018 1634   MCH 29.5 10/15/2018 1634   MCHC 31.5 10/15/2018 1634   RDW 15.8 (H) 10/15/2018 1634   LYMPHSABS 1.2 10/15/2018 1634   MONOABS 0.7 10/15/2018 1634   EOSABS 0.1 10/15/2018 1634   BASOSABS 0.1 10/15/2018 1634    BMET    Component Value Date/Time   NA 140 10/15/2018 1634   K 4.6 10/15/2018 1634   CL 107 10/15/2018 1634   CO2 26  10/15/2018 1634   GLUCOSE 103 (H) 10/15/2018 1634   BUN 14 10/15/2018 1634   CREATININE 1.18 10/15/2018 1634   CREATININE 1.18 09/01/2016 0926   CALCIUM 8.9 10/15/2018 1634   GFRNONAA >60 10/15/2018 1634   GFRAA >60 10/15/2018 1634    BNP    Component Value Date/Time   BNP 86.7 09/08/2018 1140    ProBNP    Component Value Date/Time   PROBNP 20.0 11/03/2013 1737    Imaging: No results found.    No flowsheet data found.  No results found for: NITRICOXIDE      Assessment & Plan:   COPD (chronic obstructive pulmonary disease) (HCC) Currently stable  Plan  Patient Instructions  Continue on TRELEGY 1 puff daily .  May use Oxygen with activity As needed   Advance your activity as tolerated.  Follow up with Dr. Elsworth Soho or  in 2-3 months and As needed   Please contact office for sooner follow up if symptoms do not improve or worsen or seek emergency care        Chronic respiratory failure with hypoxia [facility]) May use oxygen with activity as needed  Pneumonia due to COVID-19 virus Clinically patient is improving.  Check chest x-ray today  Plan  Patient Instructions  Continue on TRELEGY 1 puff daily .  May use Oxygen with activity As needed   Advance your activity as tolerated.  Follow up with Dr. Elsworth Soho or  in 2-3 months and As needed   Please contact office for sooner follow up if symptoms do not improve or worsen or seek emergency care            ,  NP 11/29/2018

## 2018-11-29 NOTE — Assessment & Plan Note (Signed)
Currently stable  Plan  Patient Instructions  Continue on TRELEGY 1 puff daily .  May use Oxygen with activity As needed   Advance your activity as tolerated.  Follow up with Dr. Elsworth Soho or Nicoli Nardozzi in 2-3 months and As needed   Please contact office for sooner follow up if symptoms do not improve or worsen or seek emergency care

## 2018-12-05 ENCOUNTER — Telehealth: Payer: Self-pay | Admitting: Nurse Practitioner

## 2018-12-05 ENCOUNTER — Telehealth: Payer: Self-pay | Admitting: Adult Health

## 2018-12-05 NOTE — Telephone Encounter (Signed)
Error

## 2018-12-05 NOTE — Telephone Encounter (Signed)
Call returned to patient wife (dpr), she is requesting results of her husbands CXR.   TN please advise of CXR results. Thanks.

## 2018-12-05 NOTE — Telephone Encounter (Signed)
Chest x ray showed areas of lung scarring on the left. Chronic pleural thickening on the right laterally with old rib trauma noted.  No pneumonia noted.

## 2018-12-05 NOTE — Telephone Encounter (Signed)
Attempted to call pt's wife Jeani Hawking but unable to reach.left message for Jeani Hawking to return call.

## 2018-12-05 NOTE — Telephone Encounter (Signed)
Spoke with patient's wife. Relayed results to her. She verbalized understanding. Nothing further needed at time of call.

## 2018-12-05 NOTE — Telephone Encounter (Signed)
Wife returned call for husbands results and would like a call back

## 2018-12-13 ENCOUNTER — Telehealth: Payer: Self-pay | Admitting: Pulmonary Disease

## 2018-12-13 MED ORDER — ANORO ELLIPTA 62.5-25 MCG/INH IN AEPB: 1.0000 | INHALATION_SPRAY | Freq: Every day | RESPIRATORY_TRACT | 5 refills | Status: DC

## 2018-12-13 NOTE — Telephone Encounter (Signed)
Called & spoke to pt's wife w/ TP's recommendations. Pt wife verbalized understanding with no additional questions.   Order for Anoro 1 puff daily has been sent to pt's preferred pharmacy (verified w/ pt's wife). I have also made adjustments to pt's MAR per TP. Nothing further needed at this time.

## 2018-12-13 NOTE — Telephone Encounter (Signed)
Called & spoke w/ pt's wife, Jeani Hawking (on [place]). Pt wife states she forgot to mention at [place] 11/29/2018 w/ TP that pt feels as though his Trelegy inhaler is causing mouth soreness, sore throat, and thrush. She states pt would like to switch back to Anoro, his previous inhaler, because he states it works just as well as [employer] and does not give him mouth sores. I let pt wife know that I would send a message to TP for f/u and get back with her as soon as she responds. Pt wife expressed understanding.    TP, please advise with your recommendations for this pt. Thank you.

## 2018-12-13 NOTE — Telephone Encounter (Signed)
That is fine to change back to  [facility] 1 puff daily , rinse after use.  Let us know if mouth is not improving .  Eat yogurt daily   Please contact office for sooner follow up if symptoms do not improve or worsen or seek emergency care    Keep follow up for COPD , to make sure Jearl Klinefelter is working ok .   Please adjust MAR

## 2019-01-20 ENCOUNTER — Other Ambulatory Visit: Payer: Self-pay | Admitting: Cardiovascular Disease

## 2019-01-31 ENCOUNTER — Other Ambulatory Visit: Payer: Self-pay

## 2019-01-31 ENCOUNTER — Ambulatory Visit (INDEPENDENT_AMBULATORY_CARE_PROVIDER_SITE_OTHER): Payer: BC Managed Care – PPO | Admitting: Adult Health

## 2019-01-31 ENCOUNTER — Encounter: Payer: Self-pay | Admitting: Adult Health

## 2019-01-31 VITALS — BP 120/60 | HR 51 | Temp 98.1°F | Ht 68.0 in | Wt 209.8 lb

## 2019-01-31 DIAGNOSIS — J432 Centrilobular emphysema: Secondary | ICD-10-CM

## 2019-01-31 DIAGNOSIS — J9611 Chronic respiratory failure with hypoxia: Secondary | ICD-10-CM

## 2019-01-31 DIAGNOSIS — Z23 Encounter for immunization: Secondary | ICD-10-CM

## 2019-01-31 NOTE — Progress Notes (Signed)
 @Patient  ID: Matthew EssexEddie R Kleeman, male    DOB: 02-21-1951, 68 y.o.   MRN: [id number]  Chief Complaint  Patient presents with  . Follow-up    Emphysema     Referring provider: Moshe SalisburyKendall, Lonnie, NP  HPI: 68 year old male former smoker followed for moderate COPD Medical history significant for ischemic cardiomyopathy, AAA  TEST/EVENTS :  PFT's 2014: FEV1 2.04 (68%), +15% increase with BD, no restriction, normal DLCO, +airtrapping.   3/2018Spirometry shows similar lung function with FEV1 at 68%, ratio 73, FVC 69%  CT angio 4/2018nodular scarring right upper lobe 5 mm  01/31/2019 Follow up : COPD , COVID 19 PNA  Patient returns for a 4024-month follow-up.  Patient has underlying COPD.  Patient was hospitalized in April 2020 for COVID-19 viral infection with secondary pneumonia with disease bilateral infiltrates on chest x-ray).  He had aggressive treatment including Plaquenil, azithromycin and Actemra , high flow oxygen.  Patient has had gradual clinical improvement.  He was discharged on oxygen.  He says he is not needed his oxygen much in the last couple months.  He says overall breathing is doing better.  He feels that he is getting stronger.  He is trying to be more active and do more walking.  He denies any flare of cough wheezing. He remains on Anoro.  Previously had tried Trelegy but felt that it irritated his throat so resumed his previous Anoro inhaler. Chest x-ray last visit showed areas of lung scarring on the left chronic changes.  No acute process noted Patient had been discharged on oxygen with activity.  O2 saturations today in the office with no desaturations walking on room air.    Allergies  Allergen Reactions  . Niaspan [Niacin] Rash  . Septra [Sulfamethoxazole-Trimethoprim] Rash  . Simcor [Niacin-Simvastatin Er] Rash  . Zocor [Simvastatin] Rash    Immunization History  Administered Date(s) Administered  . Fluad Quad(high Dose 65+) 01/31/2019  . Influenza, High  Dose Seasonal PF 09/20/2018  . Pneumococcal Polysaccharide-23 09/20/2018    Past Medical History:  Diagnosis Date  . AAA (abdominal aortic aneurysm) (HCC)    mild; doppler 08/29/12-3.4x3.7  . Arthritis    "hands, back" (09/28/2016)  . CAD (coronary artery disease)    echo 07/26/12-EF 55-60%; myoview 07/26/12-low normal wall motion with mild apical hypokinesis as well a septal wall motion consistent with LBBB  . Carotid artery occlusion   . Chronic lower back pain   . Cutaneous lupus erythematosus    [name], [name]  . Dementia (HCC)    "moderate; dx'd in ~ 2011" (09/28/2016)  . Dermatitis   . Emphysema   . GERD (gastroesophageal reflux disease)   . H/O hiatal hernia   . Headache(784.0)   . History of palpitations   . HTN (hypertension)   . Hyperlipidemia   . Hypothyroidism    HX OF RADIOACTIVE IODINE   . Memory loss    d/t mini stroke  . Mini stroke (HCC) ~ 2011  . Myocardial infarction (HCC) 09/2007   PCI to RCA  . Shortness of breath    WITH EXERTION  . Sleep apnea    sleep study 12/14/06 ahi 16.89, during rem 12.3, supine ahi 57.49; cpap 02/22/07-c-flex of 3 at 13cm h2o; PT STATES HE DOES NOT USE HIS CPAP EVERY NIGHT  . Sleep apnea    "aien't worn mask in 3 years" (09/28/2016)  . Stroke [facility](HCC)  PT STATES NEUROLOGIST TOLD HIM HE HAD HAD MINI STROKE  . Thyroid disease    hypothyroidism    Tobacco History: Social History   Tobacco Use  Smoking Status Former Smoker  . Packs/day: 2.00  . Years: 45.00  . Pack years: 90.00  . Types: Cigarettes  . Quit date: 02/04/2010  . Years since quitting: 8.9  Smokeless Tobacco Never Used   Counseling given: Not Answered   Outpatient Medications Prior to Visit  Medication Sig Dispense Refill  . Albuterol Sulfate (PROAIR RESPICLICK) 660 (90 Base) MCG/ACT AEPB Inhale 2 puffs into the lungs every 6 (six) hours as needed. (Patient taking differently: Inhale 2 puffs  into the lungs every 6 (six) hours as needed (sob and wheezing). ) 1 each 5  . ASPIRIN LOW DOSE 81 MG EC tablet TAKE 1 TABLET BY MOUTH EVERY DAY 90 tablet 9  . clopidogrel (PLAVIX) 75 MG tablet TAKE 1 TABLET (75 MG TOTAL) BY MOUTH DAILY WITH BREAKFAST. (Patient taking differently: Take 75 mg by mouth daily. ) 90 tablet 3  . cyclobenzaprine (FLEXERIL) 10 MG tablet Take 10 mg by mouth 2 (two) times daily as needed for muscle spasms.   0  . hydrochlorothiazide (MICROZIDE) 12.5 MG capsule Take 1 capsule (12.5 mg total) by mouth every other day. 45 capsule 3  . HYDROcodone-acetaminophen (NORCO) 7.5-325 MG tablet Take 1 tablet by mouth every 6 (six) hours as needed for moderate pain or severe pain.     . isosorbide mononitrate (IMDUR) 60 MG 24 hr tablet Take 1.5 tablets (90 mg total) by mouth daily. 135 tablet 3  . losartan (COZAAR) 25 MG tablet Take 25 mg by mouth every evening.     . methocarbamol (ROBAXIN) 500 MG tablet Take 500 mg by mouth every 6 (six) hours as needed for muscle spasms.     . metoprolol succinate (TOPROL-XL) 25 MG 24 hr tablet Take 1 tablet (25 mg total) by mouth daily. 90 tablet 3  . Omega-3 Fatty Acids (FISH OIL) 1200 MG CAPS Take 1,200 mg by mouth 2 (two) times daily.     Marland Kitchen SYNTHROID 175 MCG tablet Take 175 mcg by mouth daily before breakfast.     . umeclidinium-vilanterol (ANORO ELLIPTA) 62.5-25 MCG/INH AEPB Inhale 1 puff into the lungs daily. 14 each 5  . guaiFENesin-codeine 100-10 MG/5ML syrup Take 5 mLs by mouth every 6 (six) hours as needed for cough.     . ondansetron (ZOFRAN ODT) 4 MG disintegrating tablet Take 1 tablet (4 mg total) by mouth every 8 (eight) hours as needed. (Patient not taking: Reported on 01/31/2019) 10 tablet 0   No facility-administered medications prior to visit.      Review of Systems:   Constitutional:   No  weight loss, night sweats,  Fevers, chills,  +fatigue, or  lassitude.  HEENT:   No headaches,  Difficulty swallowing,  Tooth/dental  problems, or  Sore throat,                No sneezing, itching, ear ache, nasal congestion, post nasal drip,   CV:  No chest pain,  Orthopnea, PND, swelling in lower extremities, anasarca, dizziness, palpitations, syncope.   GI  No heartburn, indigestion, abdominal pain, nausea, vomiting, diarrhea, change in bowel habits, loss of appetite, bloody stools.   Resp: .  No excess mucus, no productive cough,  No non-productive cough,  No coughing up of blood.  No change in color of mucus.  No wheezing.  No chest wall  deformity  Skin: no rash or lesions.  GU: no dysuria, change in color of urine, no urgency or frequency.  No flank pain, no hematuria   MS:  No joint pain or swelling.  No decreased range of motion.  No back pain.    Physical Exam  BP 120/60 (BP Location: Left Arm, Cuff Size: Normal)   Pulse (!) 51   Temp 98.1 F (36.7 C) (Oral)   Ht 5\' 8"  (1.727 m)   Wt 209 lb 12.8 oz (95.2 kg)   SpO2 96%   BMI 31.90 kg/m   GEN: A/Ox3; pleasant , NAD, elderly   HEENT:  [place]/AT, NOSE-clear, THROAT-clear, no lesions, no postnasal drip or exudate noted.   NECK:  Supple w/ fair ROM; no JVD; normal carotid impulses w/o bruits; no thyromegaly or nodules palpated; no lymphadenopathy.    RESP diminished breath sounds in the bases. no accessory muscle use, no dullness to percussion  CARD:  RRR, no m/r/g, tr  peripheral edema, pulses intact, no cyanosis or clubbing.  GI:   Soft & nt; nml bowel sounds; no organomegaly or masses detected.   Musco: Warm bil, no deformities or joint swelling noted.   Neuro: alert, no focal deficits noted.    Skin: Warm, no lesions or rashes    Lab Results:  CBC    Component Value Date/Time   WBC 10.5 10/15/2018 1634   RBC 4.82 10/15/2018 1634   HGB 14.2 10/15/2018 1634   HCT 45.1 10/15/2018 1634   PLT 333 10/15/2018 1634   MCV 93.6 10/15/2018 1634   MCH 29.5 10/15/2018 1634   MCHC 31.5 10/15/2018 1634   RDW 15.8 (H) 10/15/2018 1634   LYMPHSABS  1.2 10/15/2018 1634   MONOABS 0.7 10/15/2018 1634   EOSABS 0.1 10/15/2018 1634   BASOSABS 0.1 10/15/2018 1634    BMET    Component Value Date/Time   NA 140 10/15/2018 1634   K 4.6 10/15/2018 1634   CL 107 10/15/2018 1634   CO2 26 10/15/2018 1634   GLUCOSE 103 (H) 10/15/2018 1634   BUN 14 10/15/2018 1634   CREATININE 1.18 10/15/2018 1634   CREATININE 1.18 09/01/2016 0926   CALCIUM 8.9 10/15/2018 1634   GFRNONAA >60 10/15/2018 1634   GFRAA >60 10/15/2018 1634    BNP    Component Value Date/Time   BNP 86.7 09/08/2018 1140    ProBNP    Component Value Date/Time   PROBNP 20.0 11/03/2013 1737    Imaging: No results found.    No flowsheet data found.  No results found for: NITRICOXIDE      Assessment & Plan:   COPD (chronic obstructive pulmonary disease) (HCC) Moderate COPD currently stable on current regimen  flu shot today  Plan  Patient Instructions  Continue on ANORO 1 puff daily . Rinse well after use.  May discontinue Oxygen  Flu shot today .  Advance your activity as tolerated.  Follow up with Dr. Vassie Loll or Hermes Wafer in  4 months and As needed  Please contact office for sooner follow up if symptoms do not improve or worsen or seek emergency care        Chronic respiratory failure with hypoxia (HCC) Walk test in the office today with no desaturations on room air.  May discontinue home oxygen     Rubye Oaks, NP 01/31/2019

## 2019-01-31 NOTE — Assessment & Plan Note (Signed)
Moderate COPD currently stable on current regimen  flu shot today  Plan  Patient Instructions  Continue on ANORO 1 puff daily . Rinse well after use.  May discontinue Oxygen  Flu shot today .  Advance your activity as tolerated.  Follow up with Dr. Elsworth Soho or Robinette Esters in  4 months and As needed  Please contact office for sooner follow up if symptoms do not improve or worsen or seek emergency care

## 2019-01-31 NOTE — Assessment & Plan Note (Signed)
Walk test in the office today with no desaturations on room air.  May discontinue home oxygen

## 2019-01-31 NOTE — Patient Instructions (Addendum)
Continue on ANORO 1 puff daily . Rinse well after use.  May discontinue Oxygen  Flu shot today .  Advance your activity as tolerated.  Follow up with Dr. Elsworth Soho or [place] in  4 months and As needed  Please contact office for sooner follow up if symptoms do not improve or worsen or seek emergency care

## 2019-03-02 ENCOUNTER — Other Ambulatory Visit: Payer: Self-pay | Admitting: Cardiovascular Disease

## 2019-03-03 ENCOUNTER — Other Ambulatory Visit: Payer: Self-pay

## 2019-03-03 ENCOUNTER — Ambulatory Visit (INDEPENDENT_AMBULATORY_CARE_PROVIDER_SITE_OTHER): Payer: BC Managed Care – PPO | Admitting: Cardiovascular Disease

## 2019-03-03 ENCOUNTER — Encounter: Payer: Self-pay | Admitting: Cardiovascular Disease

## 2019-03-03 VITALS — BP 145/85 | HR 76 | Ht 68.0 in | Wt 207.0 lb

## 2019-03-03 DIAGNOSIS — I10 Essential (primary) hypertension: Secondary | ICD-10-CM

## 2019-03-03 DIAGNOSIS — I2583 Coronary atherosclerosis due to lipid rich plaque: Secondary | ICD-10-CM | POA: Diagnosis not present

## 2019-03-03 DIAGNOSIS — I2510 Atherosclerotic heart disease of native coronary artery without angina pectoris: Secondary | ICD-10-CM | POA: Diagnosis not present

## 2019-03-03 DIAGNOSIS — I447 Left bundle-branch block, unspecified: Secondary | ICD-10-CM

## 2019-03-03 DIAGNOSIS — E785 Hyperlipidemia, unspecified: Secondary | ICD-10-CM

## 2019-03-03 DIAGNOSIS — I714 Abdominal aortic aneurysm, without rupture, unspecified: Secondary | ICD-10-CM

## 2019-03-03 DIAGNOSIS — J449 Chronic obstructive pulmonary disease, unspecified: Secondary | ICD-10-CM | POA: Diagnosis not present

## 2019-03-03 MED ORDER — LOSARTAN POTASSIUM 50 MG PO TABS: 50.0000 mg | ORAL_TABLET | Freq: Every day | ORAL | 3 refills | Status: DC

## 2019-03-03 NOTE — Patient Instructions (Addendum)
Medication Instructions:   CHANGE IN MEDICATIONS  -- INCREASE TO LOSARTAN 50 MG  DAILY   If you need a refill on your cardiac medications before your next appointment, please call your pharmacy.   Lab work:PLEASE HAVE DR Julianne Rice OFFICE SEND A COPY OF LABS .  Testing/Procedures: NOT NEEDED  Follow-Up: At [facility], you and your health needs are our priority.  As part of our continuing mission to provide you with exceptional heart care, we have created designated Provider Care Teams.  These Care Teams include your primary Cardiologist (physician) and Advanced Practice Providers (APPs -  Physician Assistants and Nurse Practitioners) who all work together to provide you with the care you need, when you need it. . Your physician recommends that you schedule a follow-up appointment in: [place] .   Any Other Special Instructions Will Be Listed Below (If Applicable).

## 2019-03-03 NOTE — Progress Notes (Signed)
Patient ID: Matthew Evans, male   DOB: 07-19-1950, 68 y.o.   MRN: [id number]      HPI: Matthew Evans, is a 68 y.o. male who presents to the office today for a 92 month cardiology evaluation.    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 He underwent staged cutting balloon to a high grade LAD stenosis which involved multiple branches and stenting was not done to reduce potential for jailing of his multiple branches. In August 2010 he was found to have 50-60% stenosis beyond his RCA stent in the LAD intervention site remained patent. He has a documented abdominal aortic aneurysm and a followup evaluation with Doppler imaging  on 08/29/2012 was essentially unchanged from one year previously and showed a measurement of 3.4x3.7 cm.  Additional problems include COPD, hypertension, hypothyroidism, hyperlipidemia, GERD. A nuclear study in February 2014 remained low risk and demonstrated mild apical hypokinesis with low normal wall motion without ischemia. An echo Doppler study in February 2014 showed mild LVH with grade 1 diastolic dysfunction and normal systolic function. He did have aortic valve thickening without stenosis and mild left atrial dilatation. Had normal pulmonary pressures.  He has a history of palpitations which has been treated with low dose  beta blocker therapy.He has COPD. He recently has been on Anora in addition to when necessary albuterol.  He underwent a Nissen fundoplication hiatal hernia surgery by Matthew Evans in May, 2015.  Since that time, he has lost approximately 20 pounds.  In 2015 A follow-up abdominal aortic ultrasound his revealed 3.6 x 3.3 cm with a small amount of atherosclerosis visualized, which was not hemodynamically significant.  This was not significantly changed from one year previously.  He had an emergency room evaluation on 09/15/2014.  He had been working aggressively at home in his yard  intensely and overdid it.  He presented with presyncope and also had mild dyspnea.  He had noticed some mild dyspnea on exertion for the past several months.  In the emergency room he was noted to have an irregular heart rate but sinus rhythm with frequent PVCs.  PVCs were not conducted so his resting pulse radially was bradycardic.    On 09/30/2014 an echo Doppler showed an EF of 40-45% with diffuse hypokinesis and grade 2 diastolic dysfunction.  There was mitral annular calcification.  His left atrium was mildly dilated.  Right ventricle was normal.  A nuclear perfusion study done the same day was interpreted as low risk.  He has left bundle branch block.  There was a small mild fixed anterior defect consistent with soft tissue attenuation versus possible left bundle branch block abnormality.  There was no ischemia.  A follow-up abdominal ultrasound in June 2016 showed an aortic aneurysm of 3.7 x 3.5, which was not significantly changed.  At times, he has noticed rare episodes of chest pain but typically most of his symptoms are related to shortness of breath with activity.  He was supposed to be taking isosorbide 90 mg daily, but has only been taking 60 mg.  He now sees Matthew Evans for his pulmonary care.  Matthew. Labell has been followed for primary care by Matthew Evans.  I had recently seen him prior to him undergoing shoulder surgery by Matthew Evans.which was done on 09/25/2016.  Prior to giving him  operative clearance I recommended that he undergo a preoperativean echo Doppler study. This was done on 09/01/2016 and  showed his echo EF had slightly improved and was now in the 45-50% range. He apparently tolerated his shoulder surgery well but after going home 2 days later developed increasing shortness of breath.  He presented to the hospital.  In the hospital, he again had another echo which showed an EF of 45-50%. He was referred for a nuclear stress test which raised concern for possible ischemia in the inferior  wall and probable scar in the anteroseptal wall.  As result, he underwent cardiac catheterization was done by Matthew Evans, which revealed a patent RCA stent and 30% LAD and circumflex narrowing.  Angiographically there was no obvious culprit lesion to explain his symptoms.  When I last saw him he denied any chest pain or shortness of breath.  He was walking daily up to a mile.  He has remote history of obstructive sleep apnea but had not use CPAP in over 3 years.  He stated that his sleep was poor and was wondering if he needs to reinstitute to to treatment.    He underwent a sleep study in 01/04/2017.  No significant sleep apnea was demonstrated with an overall AHI of 1.1.  AHI during rim sleep was 1.3.  There was minimum oxygen desaturation to 88%.  He had abnormal sleep architecture with absence of slow-wave sleep and prolonged latency to rems sleep.  It was felt that he had mild increased upper airway resistance syndrome without definitive obstructive sleep apnea.  Over the past several months.  He is sleeping better.  He is now seeing Matthew Evans for primary care.   I  saw him in October 2018, at which time he remained  stable from a cardiac standpoint.    He was on Toprol-XL 37.5 mg daily, losartan 75 mg daily, isosorbide 60 mg in the morning and amlodipine 5 mg for blood pressure control.  He continued to be on dual antiplatelet therapy with aspirin and Plavix.  He also takes HCTZ 12.5 mg every other day.  He is on levothyroxine for hypothyroidism.  He takes an oral Ellipta for his COPD.   Last saw him in September 2019.  At that time he had sinus bradycardia with a ventricular rate at 47 with left bundle branch block and Toprol was reduced.  Matthew Evans ultrasound in April 2019 revealed abdominal aorta measuring 3.3 cm which was not significantly changed compared to his prior examinations.  [name] in June 2019 showed a total cholesterol 122, LDL 73.  Since I last saw him, he developed COVID-19 infection  on April 1 and was hospitalized from April 5 through September 20, 2018 with significant Ona and bilateral patchy areas of airspace disease consistent with COVID-19 infection.  He was treated with Plaquenil x5 days, azithromycin x8 days and received Actemra on admission.  He felt markedly fatigued following his hospitalization.  He ultimately tested negative and apparently has a strong antibody response.  At present, he denies any chest pain symptoms.  At times he notes a very sharp musculoskeletal like short-lived chest pain.  He has underlying COPD and continues to have some exertional dyspnea.  Only evaluated Patricia Nettle, NP pulmonary division and he sees Dr. Elsworth Soho for his primary pulmonary care.  He presents for follow-up evaluation.   Past Medical History:  Diagnosis Date   AAA (abdominal aortic aneurysm) ([place])    mild; doppler 08/29/12-3.4x3.7   Arthritis    "hands, back" (09/28/2016)   CAD (coronary artery disease)    echo 07/26/12-EF 55-60%; myoview 07/26/12-low normal  wall motion with mild apical hypokinesis as well a septal wall motion consistent with LBBB   Carotid artery occlusion    Chronic lower back pain    Cutaneous lupus erythematosus    [name], [name]   Dementia ([place])    "moderate; dx'd in ~ 2011" (09/28/2016)   Dermatitis    Emphysema    GERD (gastroesophageal reflux disease)    H/O hiatal hernia    Headache(784.0)    History of palpitations    HTN (hypertension)    Hyperlipidemia    Hypothyroidism    HX OF RADIOACTIVE IODINE    Memory loss    d/t mini stroke   Mini stroke ([place]) ~ 2011   Myocardial infarction ([place]) 09/2007   PCI to RCA   Shortness of breath    WITH EXERTION   Sleep apnea    sleep study 12/14/06 ahi 16.89, during rem 12.3, supine ahi 57.49; cpap 02/22/07-c-flex of 3 at 13cm h2o; PT STATES HE DOES NOT USE HIS CPAP EVERY NIGHT   Sleep apnea    "aien't worn mask in  3 years" (09/28/2016)   Stroke ([place])    PT STATES NEUROLOGIST TOLD HIM HE HAD HAD MINI STROKE   Thyroid disease    hypothyroidism    Past Surgical History:  Procedure Laterality Date   balloon atherotomy     LAD   BICEPS TENDON REPAIR Right 09/25/2016   "cleaned out spurs and arthritis" (09/28/2016)   CARDIAC CATHETERIZATION  08/11/11   EF50-55%, small AAA, no signifiant stent restenosis in RCA , no restenosis of PTCA of LAD, medical therapy   CARDIAC CATHETERIZATION  09/27/06   cutting balloon arthrotomy of LAD stenosis with dilatation of a 3.5x68m cutting balloon   CARDIAC CATHETERIZATION  01/07/09   50-60% progressive dz beyond the stented segment in the RCA , LAD intervention remained patent   CORONARY ANGIOPLASTY WITH STENT PLACEMENT  09/24/06   PTCA/stent of RCA with a 3.5x255mTaxus DES post dilated to 3.9624m CORONARY ANGIOPLASTY WITH STENT PLACEMENT Right 09/2007   ESOPHAGEAL MANOMETRY N/A 08/04/2013   Procedure: ESOPHAGEAL MANOMETRY (EM);  Surgeon: PatBeryle BeamsD;  Location: [facility];  Service: Endoscopy;  Laterality: N/A;   HERNIA REPAIR     HIATAL HERNIA REPAIR  2011?   LEFT HEART CATH AND CORONARY ANGIOGRAPHY N/A 10/02/2016   Procedure: Left Heart Cath and Coronary Angiography;  Surgeon: DavLeonie ManD;  Location: [facility];  Service: Cardiovascular;  Laterality: N/A;   LEFT HEART CATHETERIZATION WITH CORONARY ANGIOGRAM N/A 08/11/2011   Procedure: LEFT HEART CATHETERIZATION WITH CORONARY ANGIOGRAM;  Surgeon: ThoTroy SineD;  [facility];  Service: Cardiovascular;  Laterality: N/A;   SHOULDER SURGERY Right     Allergies  Allergen Reactions   Niaspan [Niacin] Rash   Septra [Sulfamethoxazole-Trimethoprim] Rash   Simcor [Niacin-Simvastatin Er] Rash   Zocor [Simvastatin] Rash    Current Outpatient Medications  Medication Sig Dispense Refill   Albuterol Sulfate (PROAIR RESPICLICK) [id number] Base) MCG/ACT AEPB Inhale 2 puffs  into the lungs every 6 (six) hours as needed. (Patient taking differently: Inhale 2 puffs into the lungs every 6 (six) hours as needed (sob and wheezing). ) 1 each 5   ASPIRIN LOW DOSE 81 MG EC tablet TAKE 1 TABLET BY MOUTH EVERY DAY 90 tablet 9   cyclobenzaprine (FLEXERIL) 10 MG tablet Take 10 mg  by mouth 2 (two) times daily as needed for muscle spasms.   0   hydrochlorothiazide (MICROZIDE) 12.5 MG capsule Take 1 capsule (12.5 mg total) by mouth every other day. 45 capsule 3   HYDROcodone-acetaminophen (NORCO) 7.5-325 MG tablet Take 1 tablet by mouth every 6 (six) hours as needed for moderate pain or severe pain.      isosorbide mononitrate (IMDUR) 60 MG 24 hr tablet Take 1.5 tablets (90 mg total) by mouth daily. 135 tablet 3   methocarbamol (ROBAXIN) 500 MG tablet Take 500 mg by mouth every 6 (six) hours as needed for muscle spasms.      metoprolol succinate (TOPROL-XL) 25 MG 24 hr tablet Take 1 tablet (25 mg total) by mouth daily. 90 tablet 3   Omega-3 Fatty Acids (FISH OIL) 1200 MG CAPS Take 1,200 mg by mouth 2 (two) times daily.      SYNTHROID 175 MCG tablet Take 175 mcg by mouth daily before breakfast.      umeclidinium-vilanterol (ANORO ELLIPTA) 62.5-25 MCG/INH AEPB Inhale 1 puff into the lungs daily. 14 each 5   clopidogrel (PLAVIX) 75 MG tablet TAKE 1 TABLET (75 MG TOTAL) BY MOUTH DAILY WITH BREAKFAST. Need office visit 1st attempt 90 tablet 0   losartan (COZAAR) 50 MG tablet Take 1 tablet (50 mg total) by mouth daily. 90 tablet 3   No current facility-administered medications for this visit.     Socially he is married has 3 children. There is a remote tobacco history. He has tried to limit his sodium intake.  His wife also is my patient was diagnosed with recent breast cancer.  ROS General: Negative; No fevers, chills, or night sweats;  HEENT: Negative; No changes in vision or hearing, sinus congestion, difficulty swallowing Pulmonary: Positive for COPD, with occasional  wheezing;  mild shortness of breath with uphill walking Cardiovascular: See history of present illness GI: Hiatal hernia symptoms has resolved  since his Nissen fundoplication; No nausea, vomiting, diarrhea, or abdominal pain GU: Negative; No dysuria, hematuria, or difficulty voiding Musculoskeletal: Negative; no myalgias, joint pain, or weakness Hematologic/Oncology: Negative; no easy bruising, bleeding Endocrine: Negative; no heat/cold intolerance; no diabetes Neuro: Negative; no changes in balance, headaches; occasional numbness in his hands. Skin: Negative; No rashes or skin lesions Psychiatric: Negative; No behavioral problems, depression Sleep:   Positive for increased upper airway resistance;no bruxism, restless legs, hypnogognic hallucinations, no cataplexy Other comprehensive 14 point system review is negative.   PE BP (!) 145/85    Pulse 76    Ht _0  (1.727 m)    Wt 207 lb (93.9 kg)    SpO2 97%    BMI 31.47 kg/m    Repeat blood pressure by me was 140/84  Wt Readings from Last 3 Encounters:  03/03/19 207 lb (93.9 kg)  01/31/19 209 lb 12.8 oz (95.2 kg)  11/29/18 210 lb 6.4 oz (95.4 kg)   General: Alert, oriented, no distress.  Skin: normal turgor, no rashes, warm and dry HEENT: Normocephalic, atraumatic. Pupils equal round and reactive to light; sclera anicteric; extraocular muscles intact; Nose without nasal septal hypertrophy Mouth/Parynx benign; Mallinpatti scale 3 Neck: No JVD, no carotid bruits; normal carotid upstroke Lungs: Slightly decreased breath sounds, no wheezing Chest wall: without tenderness to palpitation Heart: PMI not displaced, RRR, s1 s2 normal, 1/6 systolic murmur, no diastolic murmur, no rubs, gallops, thrills, or heaves Abdomen: soft, nontender; no hepatosplenomehaly, BS+; abdominal aorta nontender and not dilated by palpation. Back: no CVA tenderness Pulses 2+  Musculoskeletal: full range of motion, normal strength, no joint  deformities Extremities: no clubbing cyanosis or edema, Homan's sign negative  Neurologic: grossly nonfocal; Cranial nerves grossly wnl Psychologic: Normal mood and affect   ECG (independently read by me): Normal sinus rhythm at 61 bpm.  Left axis deviation, left bundle branch block.  September 2019 ECG (independently read by me): Sinus bradycardia 47 bpm.  Left bundle branch block with repolarization changes.  October 2018 ECG (independently read by me): Sinus bradycardia 52 bpm.  Nonspecific IVCD.  Possible lateral Q waves.  QTc interval 451 ms.  May 2018 ECG (independently read by me): Sinus bradycardia at 47 bpm.  Left bundle branch block with repolarization changes.  Nose other significant ST changes.  March 2018 ECG (independently read by me):Normal sinus rhythm at 68 bpm.  Left bundle branch block with repolarization changes.  Her interval 166 ms; QTc interval 467 ms.  December 2016 ECG (independently read by me): Sinus bradycardia 58 bpm.  Left bundle branch block.  PR interval 148 ms.    June 2016 ECG (independently read by me): Sinus bradycardia 52 bpm.  Left bundle branch block with repolarization changes.  April 2016 ECG (independently read by me): Sinus rhythm at 62 bpm.  PVC.  QTc interval 475 ms.  September 2015 ECG (independently read by me): Sinus bradycardia at 46 beats per minute.  Left bundle branch block with repolarization changes  05/13/2013 ECG: Sinus bradycardia 50 beats per minute. beats per minute with mild left axis deviation and left bundle branch block. There is complete resolution of prior ectopy.   LABS  BMP Latest Ref Rng & Units 10/15/2018 09/19/2018 09/18/2018  Glucose 70 - 99 mg/dL 103(H) 115(H) 104(H)  BUN 8 - 23 mg/dL 14 26(H) 27(H)  Creatinine 0.61 - 1.24 mg/dL 1.18 1.13 1.29(H)  Sodium 135 - 145 mmol/L 140 134(L) 132(L)  Potassium 3.5 - 5.1 mmol/L 4.6 4.1 4.0  Chloride 98 - 111 mmol/L 107 95(L) 92(L)  CO2 22 - 32 mmol/L _0 Calcium 8.9 -  10.3 mg/dL 8.9 8.3(L) 8.2(L)     Hepatic Function Latest Ref Rng & Units 09/08/2018 09/06/2018 10/03/2016  Total Protein 6.5 - 8.1 g/dL 6.5 7.4 5.7(L)  Albumin 3.5 - 5.0 g/dL 2.9(L) 3.4(L) 2.8(L)  AST 15 - 41 U/L 92(H) 70(H) 31  ALT 0 - 44 U/L 49(H) 44 25  Alk Phosphatase 38 - 126 U/L 67 81 90  Total Bilirubin 0.3 - 1.2 mg/dL 0.8 0.8 0.5    CBC Latest Ref Rng & Units 10/15/2018 09/19/2018 09/18/2018  WBC 4.0 - 10.5 K/uL 10.5 10.1 11.6(H)  Hemoglobin 13.0 - 17.0 g/dL 14.2 13.8 14.1  Hematocrit 39.0 - 52.0 % 45.1 42.4 43.7  Platelets 150 - 400 K/uL 333 377 408(H)   Lab Results  Component Value Date   TSH <0.010 (L) 10/02/2016    BNP    Component Value Date/Time   PROBNP 20.0 11/03/2013 1737    Lipid Panel     Component Value Date/Time   CHOL 117 09/01/2016 0926   TRIG 94 09/01/2016 0926   HDL 32 (L) 09/01/2016 0926   CHOLHDL 3.7 09/01/2016 0926   VLDL 19 09/01/2016 0926   LDLCALC 66 09/01/2016 0926     RADIOLOGY: No results found.  IMPRESSION:  1. Coronary artery disease due to lipid rich plaque   2. Essential hypertension   3. Left bundle branch block   4. Hyperlipidemia with target LDL less than 70  5. Abdominal aortic aneurysm (AAA) without rupture ([place])   6. Chronic obstructive pulmonary disease, unspecified COPD type ([place])     ASSESSMENT AND PLAN: Matthew. Armany Mano is a 68 year old white male who is 11 years status post suffering an  inferior ST segment elevation myocardial infarction and  successful stenting of his RCA with  staged cutting balloon to high grade LAD stenosis in April 2009..  A  nuclear perfusion study in 2016 was low risk and showed probable attenuation artifact versus possible left bundle branch block attenuation.  Importantly, there was no evidence for ischemia.  Ejection fraction was 45%, which corroborates with his EF estimate on his echo Doppler study. A  preoperative echo Doppler study showed an EF of 45-50% and he had grade 1 diastolic  dysfunction.  He tolerated his shoulder surgery well but several days later developed increasing shortness of breath leading to hospitalization , which most likely was related to mild volume overload.  His troponins were positive with a flat plateau.  A nuclear stress test was performed and he was referred for definitive cardiac catheterization.  This revealed stable CAD; he had a widely patent RCA stent with mild nonobstructive disease.  Is only, he continues to be without anginal symptomatology.  Continues to be on dual antiplatelet therapy with aspirin/Plavix.  His blood pressure today is elevated despite taking Toprol-XL 25 mg, losartan 25 mg, isosorbide 90 mg, and HCTZ 12.5 mg every other day.  I have recommended further titration of losartan to 50 mg daily.  He has a history of hypothyroidism and is on levothyroxine at 175 mcg.  Developed a significant COVID-19 viral infection with initial fevers on April 1 and ultimately required specialization from April 5 through September 20, 2018.  He admits to considerable fatigability since his COVID infection subsequently had tested negative and has a strong antibody response.  He continues to be followed by pulmonary for his COPD and at present there is no wheezing and he is on Anoro Ellipta and as needed albuterol.  He has not had recent lipid studies.  However he will be seeing Matthew Evans later this week and a complete set of laboratory will be drawn.  Target LDL is less than 70 in this patient with established CAD.  He denies any abdominal pain and has been stable abdominal aneurysm.  Left bundle branch block is chronic.  I will see him in 6 months for reevaluation  Time spent: 25 minutes  Troy Sine, MD, [facility]  03/05/2019 8:05 AM

## 2019-03-05 ENCOUNTER — Encounter: Payer: Self-pay | Admitting: Cardiovascular Disease

## 2019-03-06 ENCOUNTER — Encounter: Payer: Self-pay | Admitting: Cardiovascular Disease

## 2019-03-24 DIAGNOSIS — Z1212 Encounter for screening for malignant neoplasm of rectum: Secondary | ICD-10-CM | POA: Diagnosis not present

## 2019-03-24 DIAGNOSIS — Z1211 Encounter for screening for malignant neoplasm of colon: Secondary | ICD-10-CM | POA: Diagnosis not present

## 2019-03-26 ENCOUNTER — Other Ambulatory Visit: Payer: Self-pay | Admitting: Cardiovascular Disease

## 2019-04-18 ENCOUNTER — Telehealth: Payer: Self-pay | Admitting: Cardiovascular Disease

## 2019-04-18 NOTE — Telephone Encounter (Signed)
Spoke with patients wife and patient has been having intermittent chest pains off and on over the last few days, not like when he had his heart attack. He felt ok waiting until Monday to be seen, not currently having chest pains and does not last long. Advised to go to ED if worse, verbalized understanding.

## 2019-04-18 NOTE — Telephone Encounter (Signed)
New Message  Pt c/o of Chest Pain: STAT if CP now or developed within 24 hours  1. Are you having CP right now? Yes  2. Are you experiencing any other symptoms (ex. SOB, nausea, vomiting, sweating)? Headaches  3. How long have you been experiencing CP? Couple days  4. Is your CP continuous or coming and going? Coming and going  5. Have you taken Nitroglycerin? Yes ?

## 2019-04-21 ENCOUNTER — Other Ambulatory Visit: Payer: Self-pay

## 2019-04-21 ENCOUNTER — Encounter: Payer: Self-pay | Admitting: Cardiovascular Disease

## 2019-04-21 ENCOUNTER — Ambulatory Visit (INDEPENDENT_AMBULATORY_CARE_PROVIDER_SITE_OTHER): Payer: BC Managed Care – PPO | Admitting: Cardiovascular Disease

## 2019-04-21 DIAGNOSIS — E785 Hyperlipidemia, unspecified: Secondary | ICD-10-CM | POA: Diagnosis not present

## 2019-04-21 DIAGNOSIS — I2583 Coronary atherosclerosis due to lipid rich plaque: Secondary | ICD-10-CM

## 2019-04-21 DIAGNOSIS — R0789 Other chest pain: Secondary | ICD-10-CM | POA: Diagnosis not present

## 2019-04-21 DIAGNOSIS — I2510 Atherosclerotic heart disease of native coronary artery without angina pectoris: Secondary | ICD-10-CM

## 2019-04-21 DIAGNOSIS — I714 Abdominal aortic aneurysm, without rupture, unspecified: Secondary | ICD-10-CM

## 2019-04-21 DIAGNOSIS — J449 Chronic obstructive pulmonary disease, unspecified: Secondary | ICD-10-CM

## 2019-04-21 DIAGNOSIS — I447 Left bundle-branch block, unspecified: Secondary | ICD-10-CM

## 2019-04-21 NOTE — Progress Notes (Signed)
Patient ID: Matthew Evans, male   DOB: 09/08/1950, 68 y.o.   MRN: [id number]      HPI: Matthew Evans, is a 68 y.o. male who presents to the office today for a 28monthcardiology evaluation with a chief complaint of recurrent episodes of chest twinges.   Mr BMurnanehas established CAD and suffered an ST segment elevation inferior MI and underwent RCA stenting in April 2009. He underwent staged cutting balloon to a high grade LAD stenosis which involved multiple branches and stenting was not done to reduce potential for jailing of his multiple branches. In August 2010 he was found to have 50-60% stenosis beyond his RCA stent in the LAD intervention site remained patent. He has a documented abdominal aortic aneurysm and a followup evaluation with Doppler imaging  on 08/29/2012 was essentially unchanged from one year previously and showed a measurement of 3.4x3.7 cm.  Additional problems include COPD, hypertension, hypothyroidism, hyperlipidemia, GERD. A nuclear study in February 2014 remained low risk and demonstrated mild apical hypokinesis with low normal wall motion without ischemia. An echo Doppler study in February 2014 showed mild LVH with grade 1 diastolic dysfunction and normal systolic function. He did have aortic valve thickening without stenosis and mild left atrial dilatation. Had normal pulmonary pressures.  He has a history of palpitations which has been treated with low dose  beta blocker therapy.He has COPD. He recently has been on Anora in addition to when necessary albuterol.  He underwent a Nissen fundoplication hiatal hernia surgery by Dr. RRosendo Grosin May, 2015.  Since that time, he has lost approximately 20 pounds.  In 2015 A follow-up abdominal aortic ultrasound his revealed 3.6 x 3.3 cm with a small amount of atherosclerosis visualized, which was not hemodynamically significant.  This was not significantly changed from one year previously.  He had an emergency room evaluation on  09/15/2014.  He had been working aggressively at home in his yard intensely and overdid it.  He presented with presyncope and also had mild dyspnea.  He had noticed some mild dyspnea on exertion for the past several months.  In the emergency room he was noted to have an irregular heart rate but sinus rhythm with frequent PVCs.  PVCs were not conducted so his resting pulse radially was bradycardic.    On 09/30/2014 an echo Doppler showed an EF of 40-45% with diffuse hypokinesis and grade 2 diastolic dysfunction.  There was mitral annular calcification.  His left atrium was mildly dilated.  Right ventricle was normal.  A nuclear perfusion study done the same day was interpreted as low risk.  He has left bundle branch block.  There was a small mild fixed anterior defect consistent with soft tissue attenuation versus possible left bundle branch block abnormality.  There was no ischemia.  A follow-up abdominal ultrasound in June 2016 showed an aortic aneurysm of 3.7 x 3.5, which was not significantly changed.  At times, he has noticed rare episodes of chest pain but typically most of his symptoms are related to shortness of breath with activity.  He was supposed to be taking isosorbide 90 mg daily, but has only been taking 60 mg.  He now sees Dr.[name] for his pulmonary care.  Mr. BClemonhas been followed for primary care by Dr. [name]  I had recently seen him prior to him undergoing shoulder surgery by Dr. NVeverly Fellswhich was done on 09/25/2016.  Prior to giving him  operative clearance I recommended that he undergo a preoperativean echo  Doppler study. This was done on 09/01/2016 and showed his echo EF had slightly improved and was now in the 45-50% range. He apparently tolerated his shoulder surgery well but after going home 2 days later developed increasing shortness of breath.  He presented to the hospital.  In the hospital, he again had another echo which showed an EF of 45-50%. He was referred for a nuclear stress  test which raised concern for possible ischemia in the inferior wall and probable scar in the anteroseptal wall.  As result, he underwent cardiac catheterization was done by Dr. Ellyn Hack, which revealed a patent RCA stent and 30% LAD and circumflex narrowing.  Angiographically there was no obvious culprit lesion to explain his symptoms.  When I last saw him he denied any chest pain or shortness of breath.  He was walking daily up to a mile.  He has remote history of obstructive sleep apnea but had not use CPAP in over 3 years.  He stated that his sleep was poor and was wondering if he needs to reinstitute to to treatment.    He underwent a sleep study in 01/04/2017.  No significant sleep apnea was demonstrated with an overall AHI of 1.1.  AHI during rim sleep was 1.3.  There was minimum oxygen desaturation to 88%.  He had abnormal sleep architecture with absence of slow-wave sleep and prolonged latency to rems sleep.  It was felt that he had mild increased upper airway resistance syndrome without definitive obstructive sleep apnea.  Over the past several months.  He is sleeping better.  He is now seeing Dr. Maudie Mercury for primary care.   I  saw him in October 2018, at which time he remained  stable from a cardiac standpoint.    He was on Toprol-XL 37.5 mg daily, losartan 75 mg daily, isosorbide 60 mg in the morning and amlodipine 5 mg for blood pressure control.  He continued to be on dual antiplatelet therapy with aspirin and Plavix.  He also takes HCTZ 12.5 mg every other day.  He is on levothyroxine for hypothyroidism.  He takes an oral Ellipta for his COPD.   Last saw him in September 2019.  At that time he had sinus bradycardia with a ventricular rate at 47 with left bundle branch block and Toprol was reduced.  [name] ultrasound in April 2019 revealed abdominal aorta measuring 3.3 cm which was not significantly changed compared to his prior examinations.  [name] in June 2019 showed a total cholesterol 122,  LDL 73.  He developed COVID-19 infection on April 1 and was hospitalized from April 5 through September 20, 2018 with significant Ona and bilateral patchy areas of airspace disease consistent with COVID-19 infection.  He was treated with Plaquenil x5 days, azithromycin x8 days and received Actemra on admission.  He felt markedly fatigued following his hospitalization.  He ultimately tested negative and apparently has a strong antibody response.  I last saw him on March 03, 2019 at which time he denied any chest pain.   At times he noted a very sharp musculoskeletal like short-lived chest pain.  He has underlying COPD and continues to have some exertional dyspnea.  He had been recently evaluated Patricia Nettle, NP pulmonary division and he sees Dr. Elsworth Soho for his primary pulmonary care.    He called the office for sooner evaluation due to his recurrent episodes of twinges in his chest.  These typically are nonexertional.  Over the last several days he had been using a  leaf blower which was strapped onto his back and he had pulldown straps over his chest and he was blowing significant amount of leaves.  He began to notice these twinges of chest pain which would occur typically while he was sitting and specifically were not exertionally precipitated.  Because of the recurrence he presented for reevaluation.  He denies any palpitations.  He denies PND orthopnea.  He denies presyncope or syncope.   Past Medical History:  Diagnosis Date  . AAA (abdominal aortic aneurysm) (HCC)    mild; doppler 08/29/12-3.4x3.7  . Arthritis    "hands, back" (09/28/2016)  . CAD (coronary artery disease)    echo 07/26/12-EF 55-60%; myoview 07/26/12-low normal wall motion with mild apical hypokinesis as well a septal wall motion consistent with LBBB  . Carotid artery occlusion   . Chronic lower back pain   . Cutaneous lupus erythematosus    [name], [name]  .  Dementia ([place])    "moderate; dx'd in ~ 2011" (09/28/2016)  . Dermatitis   . Emphysema   . GERD (gastroesophageal reflux disease)   . H/O hiatal hernia   . Headache(784.0)   . History of palpitations   . HTN (hypertension)   . Hyperlipidemia   . Hypothyroidism    HX OF RADIOACTIVE IODINE   . Memory loss    d/t mini stroke  . Mini stroke ([place]) ~ 2011  . Myocardial infarction ([place]) 09/2007   PCI to RCA  . Shortness of breath    WITH EXERTION  . Sleep apnea    sleep study 12/14/06 ahi 16.89, during rem 12.3, supine ahi 57.49; cpap 02/22/07-c-flex of 3 at 13cm h2o; PT STATES HE DOES NOT USE HIS CPAP EVERY NIGHT  . Sleep apnea    "aien't worn mask in 3 years" (09/28/2016)  . Stroke [facility])    PT STATES NEUROLOGIST TOLD HIM HE HAD HAD MINI STROKE  . Thyroid disease    hypothyroidism    Past Surgical History:  Procedure Laterality Date  . balloon atherotomy     LAD  . BICEPS TENDON REPAIR Right 09/25/2016   "cleaned out spurs and arthritis" (09/28/2016)  . CARDIAC CATHETERIZATION  08/11/11   EF50-55%, small AAA, no signifiant stent restenosis in RCA , no restenosis of PTCA of LAD, medical therapy  . CARDIAC CATHETERIZATION  09/27/06   cutting balloon arthrotomy of LAD stenosis with dilatation of a 3.5x7m cutting balloon  . CARDIAC CATHETERIZATION  01/07/09   50-60% progressive dz beyond the stented segment in the RCA , LAD intervention remained patent  . CORONARY ANGIOPLASTY WITH STENT PLACEMENT  09/24/06   PTCA/stent of RCA with a 3.5x223mTaxus DES post dilated to 3.9648m. CORONARY ANGIOPLASTY WITH STENT PLACEMENT Right 09/2007  . ESOPHAGEAL MANOMETRY N/A 08/04/2013   Procedure: ESOPHAGEAL MANOMETRY (EM);  Surgeon: PatBeryle BeamsD;  Location: [facility];  Service: Endoscopy;  Laterality: N/A;  . HERNIA REPAIR    . HIATAL HERNIA REPAIR  2011?  . LMarland KitchenFT HEART CATH AND CORONARY ANGIOGRAPHY N/A 10/02/2016   Procedure: Left Heart Cath and Coronary Angiography;  Surgeon: DavLeonie ManMD;  Location: [facility];  Service: Cardiovascular;  Laterality: N/A;  . LEFT HEART CATHETERIZATION WITH CORONARY ANGIOGRAM N/A 08/11/2011   Procedure: LEFT HEART CATHETERIZATION WITH CORONARY ANGIOGRAM;  Surgeon: ThoTroy SineD;  [facility];  Service: Cardiovascular;  Laterality: N/A;  .  SHOULDER SURGERY Right     Allergies  Allergen Reactions  . Niaspan [Niacin] Rash  . Septra [Sulfamethoxazole-Trimethoprim] Rash  . Simcor [Niacin-Simvastatin Er] Rash  . Zocor [Simvastatin] Rash    Current Outpatient Medications  Medication Sig Dispense Refill  . Albuterol Sulfate (PROAIR RESPICLICK) 124 (90 Base) MCG/ACT AEPB Inhale 2 puffs into the lungs every 6 (six) hours as needed. (Patient taking differently: Inhale 2 puffs into the lungs every 6 (six) hours as needed (sob and wheezing). ) 1 each 5  . ASPIRIN LOW DOSE 81 MG EC tablet TAKE 1 TABLET BY MOUTH EVERY DAY 90 tablet 9  . clopidogrel (PLAVIX) 75 MG tablet TAKE 1 TABLET (75 MG TOTAL) BY MOUTH DAILY WITH BREAKFAST. Need office visit 1st attempt 90 tablet 0  . cyclobenzaprine (FLEXERIL) 10 MG tablet Take 10 mg by mouth 2 (two) times daily as needed for muscle spasms.   0  . hydrochlorothiazide (MICROZIDE) 12.5 MG capsule Take 1 capsule (12.5 mg total) by mouth every other day. 45 capsule 3  . HYDROcodone-acetaminophen (NORCO) 7.5-325 MG tablet Take 1 tablet by mouth every 6 (six) hours as needed for moderate pain or severe pain.     . isosorbide mononitrate (IMDUR) 60 MG 24 hr tablet TAKE 1.5 TABLETS (90 MG TOTAL) BY MOUTH DAILY. 135 tablet 3  . losartan (COZAAR) 50 MG tablet Take 1 tablet (50 mg total) by mouth daily. 90 tablet 3  . methocarbamol (ROBAXIN) 500 MG tablet Take 500 mg by mouth every 6 (six) hours as needed for muscle spasms.     . metoprolol succinate (TOPROL-XL) 25 MG 24 hr tablet Take 1 tablet (25 mg total) by mouth daily. 90 tablet 3  . Omega-3 Fatty Acids (FISH OIL) 1200 MG CAPS Take 1,200 mg by mouth 2  (two) times daily.     Marland Kitchen SYNTHROID 175 MCG tablet Take 175 mcg by mouth daily before breakfast.     . umeclidinium-vilanterol (ANORO ELLIPTA) 62.5-25 MCG/INH AEPB Inhale 1 puff into the lungs daily. 14 each 5   No current facility-administered medications for this visit.     Socially he is married has 3 children. There is a remote tobacco history. He has tried to limit his sodium intake.  His wife also is my patient was diagnosed with recent breast cancer.  ROS General: Negative; No fevers, chills, or night sweats;  HEENT: Negative; No changes in vision or hearing, sinus congestion, difficulty swallowing Pulmonary: Positive for COPD, with occasional wheezing;  mild shortness of breath with uphill walking Cardiovascular: See history of present illness GI: Hiatal hernia symptoms has resolved  since his Nissen fundoplication; No nausea, vomiting, diarrhea, or abdominal pain GU: Negative; No dysuria, hematuria, or difficulty voiding Musculoskeletal: Negative; no myalgias, joint pain, or weakness Hematologic/Oncology: Negative; no easy bruising, bleeding Endocrine: Negative; no heat/cold intolerance; no diabetes Neuro: Negative; no changes in balance, headaches; occasional numbness in his hands. Skin: Negative; No rashes or skin lesions Psychiatric: Negative; No behavioral problems, depression Sleep:   Positive for increased upper airway resistance;no bruxism, restless legs, hypnogognic hallucinations, no cataplexy Other comprehensive 14 point system review is negative.   PE BP 139/77   Pulse 65   Temp (!) 97.3 F (36.3 C)   Ht '5\' 8"'  (1.727 m)   Wt 206 lb 9.6 oz (93.7 kg)   SpO2 98%   BMI 31.41 kg/m    Repeat blood pressure by me was 130/70  Wt Readings from Last 3 Encounters:  04/21/19 206 lb  9.6 oz (93.7 kg)  03/03/19 207 lb (93.9 kg)  01/31/19 209 lb 12.8 oz (95.2 kg)   General: Alert, oriented, no distress.  Skin: normal turgor, no rashes, warm and dry HEENT:  Normocephalic, atraumatic. Pupils equal round and reactive to light; sclera anicteric; extraocular muscles intact;  Nose without nasal septal hypertrophy Mouth/Parynx benign; Mallinpatti scale 3 Neck: No JVD, no carotid bruits; normal carotid upstroke Lungs: clear to ausculatation and percussion; no wheezing or rales Chest wall: No definitive chest wall tenderness to palpation  Heart: PMI not displaced, RRR, s1 s2 normal, 1/6 systolic murmur, no diastolic murmur, no rubs, gallops, thrills, or heaves Abdomen: soft, nontender; no hepatosplenomehaly, BS+; abdominal aorta nontender and not dilated by palpation. Back: no CVA tenderness Pulses 2+ Musculoskeletal: full range of motion, normal strength, no joint deformities Extremities: no clubbing cyanosis or edema, Homan's sign negative  Neurologic: grossly nonfocal; Cranial nerves grossly wnl Psychologic: Normal mood and affect   ECG (independently read by me): Normal sinus rhythm at 65 bpm.  Left bundle branch block.  QTc interval 453 ms  September 2020 ECG (independently read by me): Normal sinus rhythm at 61 bpm.  Left axis deviation, left bundle branch block.  September 2019 ECG (independently read by me): Sinus bradycardia 47 bpm.  Left bundle branch block with repolarization changes.  October 2018 ECG (independently read by me): Sinus bradycardia 52 bpm.  Nonspecific IVCD.  Possible lateral Q waves.  QTc interval 451 ms.  May 2018 ECG (independently read by me): Sinus bradycardia at 47 bpm.  Left bundle branch block with repolarization changes.  Nose other significant ST changes.  March 2018 ECG (independently read by me):Normal sinus rhythm at 68 bpm.  Left bundle branch block with repolarization changes.  Her interval 166 ms; QTc interval 467 ms.  December 2016 ECG (independently read by me): Sinus bradycardia 58 bpm.  Left bundle branch block.  PR interval 148 ms.    June 2016 ECG (independently read by me): Sinus bradycardia 52  bpm.  Left bundle branch block with repolarization changes.  April 2016 ECG (independently read by me): Sinus rhythm at 62 bpm.  PVC.  QTc interval 475 ms.  September 2015 ECG (independently read by me): Sinus bradycardia at 46 beats per minute.  Left bundle branch block with repolarization changes  05/13/2013 ECG: Sinus bradycardia 50 beats per minute. beats per minute with mild left axis deviation and left bundle branch block. There is complete resolution of prior ectopy.   LABS  BMP Latest Ref Rng & Units 10/15/2018 09/19/2018 09/18/2018  Glucose 70 - 99 mg/dL 103(H) 115(H) 104(H)  BUN 8 - 23 mg/dL 14 26(H) 27(H)  Creatinine 0.61 - 1.24 mg/dL 1.18 1.13 1.29(H)  Sodium 135 - 145 mmol/L 140 134(L) 132(L)  Potassium 3.5 - 5.1 mmol/L 4.6 4.1 4.0  Chloride 98 - 111 mmol/L 107 95(L) 92(L)  CO2 22 - 32 mmol/L '26 30 31  ' Calcium 8.9 - 10.3 mg/dL 8.9 8.3(L) 8.2(L)     Hepatic Function Latest Ref Rng & Units 09/08/2018 09/06/2018 10/03/2016  Total Protein 6.5 - 8.1 g/dL 6.5 7.4 5.7(L)  Albumin 3.5 - 5.0 g/dL 2.9(L) 3.4(L) 2.8(L)  AST 15 - 41 U/L 92(H) 70(H) 31  ALT 0 - 44 U/L 49(H) 44 25  Alk Phosphatase 38 - 126 U/L 67 81 90  Total Bilirubin 0.3 - 1.2 mg/dL 0.8 0.8 0.5    CBC Latest Ref Rng & Units 10/15/2018 09/19/2018 09/18/2018  WBC 4.0 - 10.5 K/uL  10.5 10.1 11.6(H)  Hemoglobin 13.0 - 17.0 g/dL 14.2 13.8 14.1  Hematocrit 39.0 - 52.0 % 45.1 42.4 43.7  Platelets 150 - 400 K/uL 333 377 408(H)   Lab Results  Component Value Date   TSH <0.010 (L) 10/02/2016    BNP    Component Value Date/Time   PROBNP 20.0 11/03/2013 1737    Lipid Panel     Component Value Date/Time   CHOL 117 09/01/2016 0926   TRIG 94 09/01/2016 0926   HDL 32 (L) 09/01/2016 0926   CHOLHDL 3.7 09/01/2016 0926   VLDL 19 09/01/2016 0926   LDLCALC 66 09/01/2016 0926     RADIOLOGY: No results found.  IMPRESSION:  1. Atypical chest pain   2. Coronary artery disease due to lipid rich plaque   3. Hyperlipidemia  with target LDL less than 70   4. Chronic obstructive pulmonary disease, unspecified COPD type ([place])   5. Left bundle branch block   6. Abdominal aortic aneurysm (AAA) without rupture [facility])     ASSESSMENT AND PLAN: Mr. Dewayne Severe is a 68 year old white male who is 11 years status post suffering an  inferior ST segment elevation myocardial infarction and  successful stenting of his RCA with  staged cutting balloon to high grade LAD stenosis in April 2009.  A  nuclear perfusion study in 2016 was low risk and showed probable attenuation artifact versus possible left bundle branch block attenuation.  Importantly, there was no evidence for ischemia.  Ejection fraction was 45%, which corroborates with his EF estimate on his echo Doppler study. A  preoperative echo Doppler study showed an EF of 45-50% and he had grade 1 diastolic dysfunction.  He tolerated his shoulder surgery well but several days later developed increasing shortness of breath leading to hospitalization , which most likely was related to mild volume overload.  His troponins were positive with a flat plateau.  After undergoing nuclear imaging he was referred for definitive cardiac catheterization which demonstrated  stable CAD with a widely patent RCA stent with mild nonobstructive disease.  He has recently experienced sharp twinges of chest pain which are nonexertional.  Over the past week and he was using a leaf blower a significant amount and I suspect his nonexertional chest pain twinges are mostly musculoskeletal in etiology.  His blood pressure today on repeat by me was 130/70 on his medical regimen consisting of isosorbide 90 mg daily, losartan 50 mg, Toprol-XL 25 mg daily.  He continues to be on dual antiplatelet therapy with aspirin and Plavix.  He has been taking HCTZ every other day and there is no significant edema today.  He has hypothyroidism on levothyroxine 175 mcg.  He also has underlying lung disease and is on Anoro Ellipta followed  by pulmonary.  No wheezing.  He has not had any sequela from his Covid and laboratory on Oct 23, 2018 revealed COVID-19 positive antibodies.  I reviewed recent lipid studies from Dr. Maudie Mercury from March 06, 2019.  LDL cholesterol was 85 total cholesterol 131 and triglycerides 95.  Target LDL is less than 70.  Does not appear that he is currently on lipid-lowering therapy but I am not certain if this was recently prescribed by Vassie Moment.  If he cannot tolerate statin therapy trial of Zetia 10 mg should be instituted.  He has a history of an abdominal aortic aneurysm which has been stable.  I will see him in 6 months for reevaluation   Time spent: 25 minutes  Troy Sine, MD, [facility]  04/23/2019 7:21 PM

## 2019-04-21 NOTE — Patient Instructions (Signed)
Follow-Up: IN 6 months Please call our office 2 months in advance, FEB 2021 to schedule this MAY 2021 appointment. In Person Shelva Majestic, MD.    Medication Instructions:  The current medical regimen is effective;  continue present plan and medications as directed. Please refer to the Current Medication list given to you today. If you need a refill on your cardiac medications before your next appointment, please call your pharmacy.  At [facility], you and your health needs are our priority.  As part of our continuing mission to provide you with exceptional heart care, we have created designated Provider Care Teams.  These Care Teams include your primary Cardiologist (physician) and Advanced Practice Providers (APPs -  Physician Assistants and Nurse Practitioners) who all work together to provide you with the care you need, when you need it.  Thank you for choosing [facility]!!

## 2019-04-23 ENCOUNTER — Encounter: Payer: Self-pay | Admitting: Cardiovascular Disease

## 2019-04-24 ENCOUNTER — Other Ambulatory Visit (INDEPENDENT_AMBULATORY_CARE_PROVIDER_SITE_OTHER): Payer: BC Managed Care – PPO

## 2019-04-24 DIAGNOSIS — R0789 Other chest pain: Secondary | ICD-10-CM | POA: Diagnosis not present

## 2019-04-24 DIAGNOSIS — E785 Hyperlipidemia, unspecified: Secondary | ICD-10-CM

## 2019-04-24 DIAGNOSIS — I2510 Atherosclerotic heart disease of native coronary artery without angina pectoris: Secondary | ICD-10-CM

## 2019-04-24 DIAGNOSIS — I110 Hypertensive heart disease with heart failure: Secondary | ICD-10-CM

## 2019-04-24 DIAGNOSIS — I2583 Coronary atherosclerosis due to lipid rich plaque: Secondary | ICD-10-CM

## 2019-04-24 DIAGNOSIS — I447 Left bundle-branch block, unspecified: Secondary | ICD-10-CM

## 2019-04-24 DIAGNOSIS — I493 Ventricular premature depolarization: Secondary | ICD-10-CM

## 2019-05-19 ENCOUNTER — Encounter: Payer: Self-pay | Admitting: Acute Care

## 2019-05-19 ENCOUNTER — Ambulatory Visit (INDEPENDENT_AMBULATORY_CARE_PROVIDER_SITE_OTHER): Payer: BC Managed Care – PPO | Admitting: Acute Care

## 2019-05-19 DIAGNOSIS — J309 Allergic rhinitis, unspecified: Secondary | ICD-10-CM

## 2019-05-19 DIAGNOSIS — Z8619 Personal history of other infectious and parasitic diseases: Secondary | ICD-10-CM | POA: Diagnosis not present

## 2019-05-19 DIAGNOSIS — J432 Centrilobular emphysema: Secondary | ICD-10-CM | POA: Diagnosis not present

## 2019-05-19 DIAGNOSIS — Z87891 Personal history of nicotine dependence: Secondary | ICD-10-CM | POA: Diagnosis not present

## 2019-05-19 NOTE — Progress Notes (Signed)
Virtual Visit via Video Note  I connected with Matthew Evans on 05/19/19 at  3:30 PM EST by a video enabled telemedicine application and verified that I am speaking with the correct person using two identifiers.  Location: Patient: At home Provider: At the office at [address], [place], [address]   I discussed the limitations of evaluation and management by telemedicine and the availability of in person appointments. The patient expressed understanding and agreed to proceed.  HPI: 68 year old male former smoker followed for moderate COPD Medical history significant for ischemic cardiomyopathy, AAA. He is followed by Dr. Vassie Loll  Of note, patient was hospitalized 4/ 2020 for 13 days with  COVID-19 viral infection with secondary pneumonia with disease bilateral infiltrates on chest x-ray).  He had aggressive treatment including Plaquenil, azithromycin x 8 days  and Actemra , high flow oxygen.  Patient  Gradually showed  clinical improvement.  He was discharged on oxygen. His CXR had cleared at his previous follow up 01/2019. He is no longer oxygen dependent.  His wife was also hospitalized at the same time with COVID 19 , but for 7 days.   History of Present Illness: Pt. Presents for 3 month follow up. He was last seen in the office 01/31/2019. He states he has had cough and sneezing since Saturday when he used the leaf blower in the yard to blow leaves. He states he has a runny nose with clear secretions, sneezing and cough. He has used Nyquil and other over the counter medications and he has had improvement. He didn't think he should come into the office for follow up because of his symptoms, so he called and asked that we see him virtually.  He states he has been doing well since his last visit. He has been using his Anoro daily, which has been helpful. He was taken off  oxygen at the last visit, and states he has been doing well without it. Pt. States his secretions have remained clear.He  had not had to use his rescue inhaler recently. He denies fever, chest pain, orthopnea or hemoptysis. He and his wife are continuing to use masks and socially distance themselves.    Observations/Objective: TEST/EVENTS : PFT's 2014: FEV1 2.04 (68%), +15% increase with BD, no restriction, normal DLCO, +airtrapping.   3/2018Spirometry shows similar lung function with FEV1 at 68%, ratio 73, FVC 69%  CT angio 4/2018nodular scarring right upper lobe 5 mm   Assessment and Plan: COPD Stable interval Plan Continue Anoro 1 puff once daily as you have been doing Rinse mouth after use Use rescue inhaler as needed for shortness of breath or wheezing up to 3-4 times daily. If you are requiring more rescue inhaler, please call the office to be seen.   Allergic Rhinitis 2/2 recent leaf blowing Saturday 12/12 Plan Continue use of over the counter medications you have been using for relief of symptoms. Add Zyrtec 10 mg once daily ( generic ok) Add Flonase 2 sprays up each nostril once daily Call the office if secretions change color, so we can see you.   Follow Up Instructions: Follow up in 3 months with Rubye Oaks, NP Call sooner if you need [place] sooner.    I discussed the assessment and treatment plan with the patient. The patient was provided an opportunity to ask questions and all were answered. The patient agreed with the plan and demonstrated an understanding of the instructions.   The patient was advised to call back or seek  an in-person evaluation if the symptoms worsen or if the condition fails to improve as anticipated.  I provided 35   minutes of non-face-to-face time during this encounter.   Magdalen Spatz, NP 05/19/2019 4:14 PM

## 2019-05-19 NOTE — Patient Instructions (Addendum)
It was great to talk with you today Continue Anoro 1 puff once daily as you have been doing Rinse mouth after use Use rescue inhaler as needed for shortness of breath or wheezing up to 3-4 times daily. If you are requiring more rescue inhaler, please call the office to be seen. Continue use of over the counter medications you have been using for relief of symptoms of you runny nose. Add Zyrtec 10 mg once daily ( generic ok) Add Flonase 2 sprays up each nostril once daily Call the office if secretions change color, so we can see you. Follow up in 3 onths with Rexene Edison, NP Please contact office for sooner follow up if symptoms do not improve or worsen or seek emergency care  Have a great Holiday with your family!!

## 2019-05-20 ENCOUNTER — Other Ambulatory Visit: Payer: Self-pay | Admitting: Cardiovascular Disease

## 2019-05-23 ENCOUNTER — Other Ambulatory Visit: Payer: Self-pay | Admitting: Cardiovascular Disease

## 2019-05-26 ENCOUNTER — Other Ambulatory Visit: Payer: Self-pay | Admitting: Cardiovascular Disease

## 2019-05-28 IMAGING — CT CT ANGIOGRAPHY CHEST
1 of 6 series · 4 of 16 positions shown · IV contrast (omnipaque)
Comparison: Chest CT May 30, 2018; chest radiograph October 15, 2018

CLINICAL DATA: Shortness of breath. Reported recent [id number]
episode

EXAM:
CT ANGIOGRAPHY CHEST WITH CONTRAST
TECHNIQUE: Multidetector CT imaging of the chest was performed using the
standard protocol during bolus administration of intravenous
contrast. Multiplanar CT image reconstructions and MIPs were
obtained to evaluate the vascular anatomy.
CONTRAST:  80mL OMNIPAQUE IOHEXOL 350 MG/ML SOLN

[Series 7: pe thins · axial · 0.75mm/px · z∈[+1112,+1293]mm · 4 of 432 slices shown]
[im 87/432  lung]
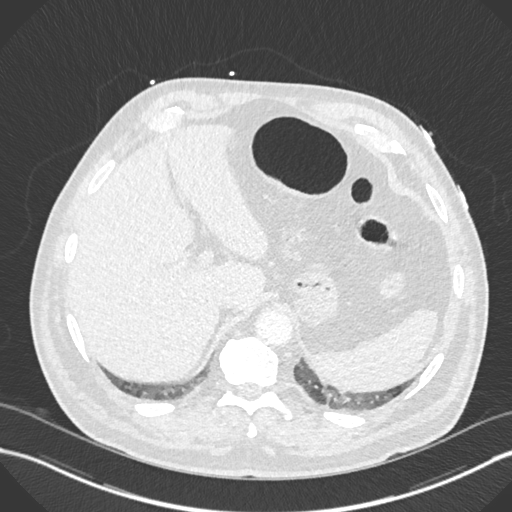
[im 173/432  soft-tissue]
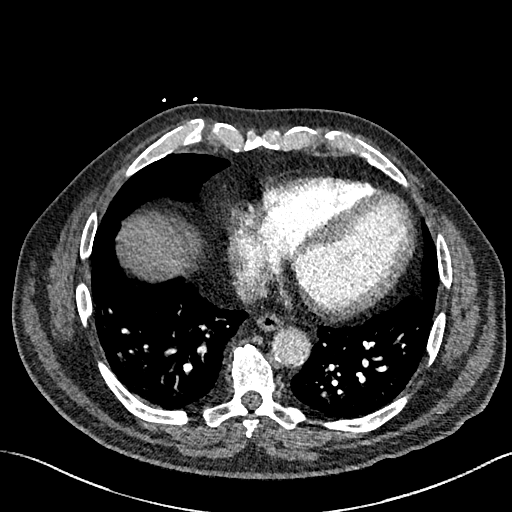
[im 259/432  lung]
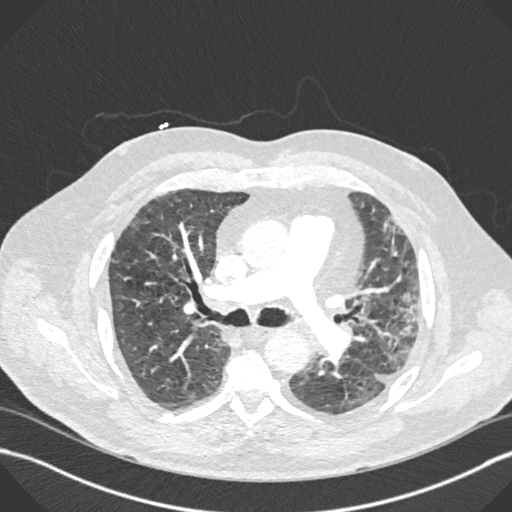
[im 345/432  soft-tissue]
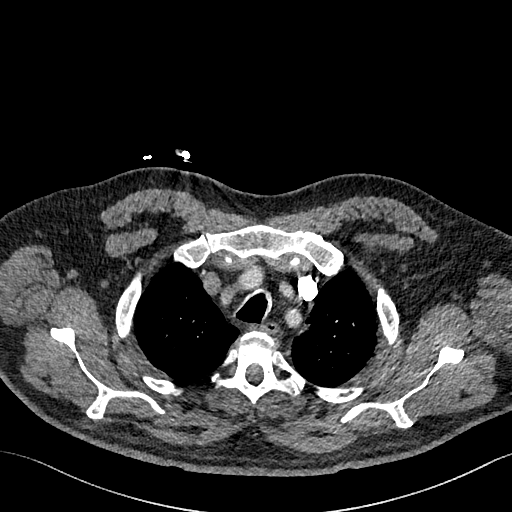

[4 of 16 positions shown; findings below may reference images not displayed]

FINDINGS: Cardiovascular: There is no demonstrable pulmonary embolus. There is
no thoracic aortic aneurysm dissection. The visualized great vessels
are unremarkable except for mild calcification in the proximal left
subclavian artery. There are foci of aortic atherosclerosis. There
are scattered foci of coronary artery calcification. There is no
pericardial effusion or pericardial thickening.

Mediastinum/Nodes: Thyroid is essentially absent, consistent with
the reported previous radioactive iodine therapy. There are
scattered subcentimeter mediastinal lymph nodes. There is no evident
thoracic adenopathy. No esophageal lesions are appreciable.

Lungs/Pleura: There are areas of patchy fibrosis in the lung
periphery bilaterally, somewhat more on the left than on the right.
There is bilateral lower lobe bronchiectatic change. There are areas
of patchy atelectatic change in the lungs bilaterally. There is a
small area of infiltrate in the posterior segment of the left lower
lobe, likely pneumonia. There is a small amount of pleural effusion
on the left tracking into the periphery of the left minor fissure.

Upper Abdomen: Visualized upper abdominal structures appear
unremarkable except for aortic atherosclerosis.

Musculoskeletal: There is degenerative change in the lower thoracic
spine. There are no blastic or lytic bone lesions. There is no chest
wall lesion.

Review of the MIP images confirms the above findings.
IMPRESSION: 1. No demonstrable pulmonary embolus. No thoracic aortic aneurysm or
dissection. There are foci of aortic atherosclerosis as well as
scattered foci of coronary artery calcification.

2. Scattered areas of peripheral fibrosis and lower lobe
bronchiectatic change. Scattered areas of atelectatic change. Small
area of suspected pneumonia in the posterior segment left lower
lobe. Small amount of pleural fluid noted on the left.

3.  No appreciable thoracic adenopathy.

4. Essentially absent thyroid consistent with previous radioiodine
therapy.

Aortic Atherosclerosis ([id number]-ZY5.5).

## 2019-06-02 ENCOUNTER — Ambulatory Visit: Payer: BC Managed Care – PPO | Admitting: Adult Health

## 2019-06-12 DIAGNOSIS — G8929 Other chronic pain: Secondary | ICD-10-CM | POA: Diagnosis not present

## 2019-06-12 DIAGNOSIS — M545 Low back pain: Secondary | ICD-10-CM | POA: Diagnosis not present

## 2019-06-12 DIAGNOSIS — I10 Essential (primary) hypertension: Secondary | ICD-10-CM | POA: Diagnosis not present

## 2019-06-12 DIAGNOSIS — E039 Hypothyroidism, unspecified: Secondary | ICD-10-CM | POA: Diagnosis not present

## 2019-06-12 DIAGNOSIS — Z79891 Long term (current) use of opiate analgesic: Secondary | ICD-10-CM | POA: Diagnosis not present

## 2019-06-18 ENCOUNTER — Other Ambulatory Visit: Payer: Self-pay | Admitting: Adult Health

## 2019-06-19 DIAGNOSIS — E039 Hypothyroidism, unspecified: Secondary | ICD-10-CM | POA: Diagnosis not present

## 2019-06-19 DIAGNOSIS — M545 Low back pain: Secondary | ICD-10-CM | POA: Diagnosis not present

## 2019-06-19 DIAGNOSIS — I10 Essential (primary) hypertension: Secondary | ICD-10-CM | POA: Diagnosis not present

## 2019-06-19 DIAGNOSIS — Z0000 Encounter for general adult medical examination without abnormal findings: Secondary | ICD-10-CM | POA: Diagnosis not present

## 2019-07-21 ENCOUNTER — Other Ambulatory Visit: Payer: Self-pay | Admitting: Cardiovascular Disease

## 2019-10-25 ENCOUNTER — Other Ambulatory Visit: Payer: Self-pay | Admitting: Cardiovascular Disease

## 2019-11-10 DIAGNOSIS — Z79891 Long term (current) use of opiate analgesic: Secondary | ICD-10-CM | POA: Diagnosis not present

## 2019-11-10 DIAGNOSIS — M545 Low back pain: Secondary | ICD-10-CM | POA: Diagnosis not present

## 2019-11-10 DIAGNOSIS — E039 Hypothyroidism, unspecified: Secondary | ICD-10-CM | POA: Diagnosis not present

## 2019-11-10 DIAGNOSIS — I10 Essential (primary) hypertension: Secondary | ICD-10-CM | POA: Diagnosis not present

## 2019-11-17 DIAGNOSIS — R5382 Chronic fatigue, unspecified: Secondary | ICD-10-CM | POA: Diagnosis not present

## 2019-11-17 DIAGNOSIS — I10 Essential (primary) hypertension: Secondary | ICD-10-CM | POA: Diagnosis not present

## 2019-11-17 DIAGNOSIS — I639 Cerebral infarction, unspecified: Secondary | ICD-10-CM | POA: Diagnosis not present

## 2019-11-17 DIAGNOSIS — G301 Alzheimer's disease with late onset: Secondary | ICD-10-CM | POA: Diagnosis not present

## 2019-11-17 DIAGNOSIS — G4733 Obstructive sleep apnea (adult) (pediatric): Secondary | ICD-10-CM | POA: Diagnosis not present

## 2019-11-17 DIAGNOSIS — E785 Hyperlipidemia, unspecified: Secondary | ICD-10-CM | POA: Diagnosis not present

## 2019-11-17 DIAGNOSIS — L932 Other local lupus erythematosus: Secondary | ICD-10-CM | POA: Diagnosis not present

## 2019-11-17 DIAGNOSIS — I2510 Atherosclerotic heart disease of native coronary artery without angina pectoris: Secondary | ICD-10-CM | POA: Diagnosis not present

## 2019-11-17 DIAGNOSIS — M545 Low back pain: Secondary | ICD-10-CM | POA: Diagnosis not present

## 2019-11-17 DIAGNOSIS — I719 Aortic aneurysm of unspecified site, without rupture: Secondary | ICD-10-CM | POA: Diagnosis not present

## 2019-11-17 DIAGNOSIS — E039 Hypothyroidism, unspecified: Secondary | ICD-10-CM | POA: Diagnosis not present

## 2019-12-29 DIAGNOSIS — J449 Chronic obstructive pulmonary disease, unspecified: Secondary | ICD-10-CM | POA: Diagnosis not present

## 2019-12-29 DIAGNOSIS — Z79899 Other long term (current) drug therapy: Secondary | ICD-10-CM | POA: Diagnosis not present

## 2019-12-29 DIAGNOSIS — I2510 Atherosclerotic heart disease of native coronary artery without angina pectoris: Secondary | ICD-10-CM | POA: Diagnosis not present

## 2019-12-29 DIAGNOSIS — Z7982 Long term (current) use of aspirin: Secondary | ICD-10-CM | POA: Diagnosis not present

## 2019-12-29 DIAGNOSIS — I639 Cerebral infarction, unspecified: Secondary | ICD-10-CM | POA: Diagnosis not present

## 2019-12-29 DIAGNOSIS — I10 Essential (primary) hypertension: Secondary | ICD-10-CM | POA: Diagnosis not present

## 2019-12-29 DIAGNOSIS — E785 Hyperlipidemia, unspecified: Secondary | ICD-10-CM | POA: Diagnosis not present

## 2019-12-29 DIAGNOSIS — E876 Hypokalemia: Secondary | ICD-10-CM | POA: Diagnosis not present

## 2019-12-29 DIAGNOSIS — E039 Hypothyroidism, unspecified: Secondary | ICD-10-CM | POA: Diagnosis not present

## 2019-12-30 DIAGNOSIS — E876 Hypokalemia: Secondary | ICD-10-CM | POA: Diagnosis not present

## 2020-02-20 ENCOUNTER — Encounter: Payer: Self-pay | Admitting: Cardiovascular Disease

## 2020-02-20 ENCOUNTER — Other Ambulatory Visit: Payer: Self-pay

## 2020-02-20 ENCOUNTER — Ambulatory Visit (INDEPENDENT_AMBULATORY_CARE_PROVIDER_SITE_OTHER): Payer: BC Managed Care – PPO | Admitting: Cardiovascular Disease

## 2020-02-20 VITALS — BP 118/62 | HR 44 | Ht 68.0 in | Wt 206.0 lb

## 2020-02-20 DIAGNOSIS — J449 Chronic obstructive pulmonary disease, unspecified: Secondary | ICD-10-CM | POA: Diagnosis not present

## 2020-02-20 DIAGNOSIS — I447 Left bundle-branch block, unspecified: Secondary | ICD-10-CM | POA: Diagnosis not present

## 2020-02-20 DIAGNOSIS — R0789 Other chest pain: Secondary | ICD-10-CM

## 2020-02-20 DIAGNOSIS — E785 Hyperlipidemia, unspecified: Secondary | ICD-10-CM

## 2020-02-20 DIAGNOSIS — R001 Bradycardia, unspecified: Secondary | ICD-10-CM

## 2020-02-20 DIAGNOSIS — I2583 Coronary atherosclerosis due to lipid rich plaque: Secondary | ICD-10-CM | POA: Diagnosis not present

## 2020-02-20 DIAGNOSIS — I2510 Atherosclerotic heart disease of native coronary artery without angina pectoris: Secondary | ICD-10-CM

## 2020-02-20 MED ORDER — METOPROLOL SUCCINATE ER 25 MG PO TB24
12.5000 mg | ORAL_TABLET | Freq: Every day | ORAL | 3 refills | Status: DC
Start: 2020-02-20 — End: 2021-03-25

## 2020-02-20 MED ORDER — CLOPIDOGREL BISULFATE 75 MG PO TABS
75.0000 mg | ORAL_TABLET | Freq: Every day | ORAL | 3 refills | Status: DC
Start: 2020-02-20 — End: 2021-05-16

## 2020-02-20 NOTE — Progress Notes (Signed)
Patient ID: Matthew Evans, male   DOB: 1951/04/13, 70 y.o.   MRN: [id number]      HPI: Matthew Evans, is a 69 y.o. male who presents to the office today for a 10 month cardiology evaluation.   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 He underwent staged cutting balloon to a high grade LAD stenosis which involved multiple branches and stenting was not done to reduce potential for jailing of his multiple branches. In August 2010 he was found to have 50-60% stenosis beyond his RCA stent in the LAD intervention site remained patent. He has a documented abdominal aortic aneurysm and a followup evaluation with Doppler imaging  on 08/29/2012 was essentially unchanged from one year previously and showed a measurement of 3.4x3.7 cm.  Additional problems include COPD, hypertension, hypothyroidism, hyperlipidemia, GERD. A nuclear study in February 2014 remained low risk and demonstrated mild apical hypokinesis with low normal wall motion without ischemia. An echo Doppler study in February 2014 showed mild LVH with grade 1 diastolic dysfunction and normal systolic function. He did have aortic valve thickening without stenosis and mild left atrial dilatation. Had normal pulmonary pressures.  He has a history of palpitations which has been treated with low dose  beta blocker therapy.He has COPD. He recently has been on Anora in addition to when necessary albuterol.  He underwent a Nissen fundoplication hiatal hernia surgery by Matthew. Rosendo Evans in May, 2015.  Since that time, he has lost approximately 20 pounds.  In 2015 A follow-up abdominal aortic ultrasound his revealed 3.6 x 3.3 cm with a small amount of atherosclerosis visualized, which was not hemodynamically significant.  This was not significantly changed from one year previously.  He had an emergency room evaluation on 09/15/2014.  He had been working aggressively at home in his yard  intensely and overdid it.  He presented with presyncope and also had mild dyspnea.  He had noticed some mild dyspnea on exertion for the past several months.  In the emergency room he was noted to have an irregular heart rate but sinus rhythm with frequent PVCs.  PVCs were not conducted so his resting pulse radially was bradycardic.    On 09/30/2014 an echo Doppler showed an EF of 40-45% with diffuse hypokinesis and grade 2 diastolic dysfunction.  There was mitral annular calcification.  His left atrium was mildly dilated.  Right ventricle was normal.  A nuclear perfusion study done the same day was interpreted as low risk.  He has left bundle branch block.  There was a small mild fixed anterior defect consistent with soft tissue attenuation versus possible left bundle branch block abnormality.  There was no ischemia.  A follow-up abdominal ultrasound in June 2016 showed an aortic aneurysm of 3.7 x 3.5, which was not significantly changed.  At times, he has noticed rare episodes of chest pain but typically most of his symptoms are related to shortness of breath with activity.  He was supposed to be taking isosorbide 90 mg daily, but has only been taking 60 mg.  He now sees Matthew.[name] for his pulmonary care.  Matthew. [name] has been followed for primary care by Matthew. Wilson Singer.  I had recently seen him prior to him undergoing shoulder surgery by Matthew. Veverly Evans.which was done on 09/25/2016.  Prior to giving him  operative clearance I recommended that he undergo a preoperativean echo Doppler study. This was done on 09/01/2016 and showed  his echo EF had slightly improved and was now in the 45-50% range. He apparently tolerated his shoulder surgery well but after going home 2 days later developed increasing shortness of breath.  He presented to the hospital.  In the hospital, he again had another echo which showed an EF of 45-50%. He was referred for a nuclear stress test which raised concern for possible ischemia in the inferior  wall and probable scar in the anteroseptal wall.  As result, he underwent cardiac catheterization was done by Matthew. Ellyn Evans, which revealed a patent RCA stent and 30% LAD and circumflex narrowing.  Angiographically there was no obvious culprit lesion to explain his symptoms.  When I last saw him he denied any chest pain or shortness of breath.  He was walking daily up to a mile.  He has remote history of obstructive sleep apnea but had not use CPAP in over 3 years.  He stated that his sleep was poor and was wondering if he needs to reinstitute to to treatment.    He underwent a sleep study in 01/04/2017.  No significant sleep apnea was demonstrated with an overall AHI of 1.1.  AHI during REM sleep was 1.3.  There was minimum oxygen desaturation to 88%.  He had abnormal sleep architecture with absence of slow-wave sleep and prolonged latency to rems sleep.  It was felt that he had mild increased upper airway resistance syndrome without definitive obstructive sleep apnea.  Over the past several months.  He is sleeping better.  He is now seeing Matthew. Maudie Evans for primary care.   I  saw him in October 2018, at which time he remained  stable from a cardiac standpoint.    He was on Toprol-XL 37.5 mg daily, losartan 75 mg daily, isosorbide 60 mg in the morning and amlodipine 5 mg for blood pressure control.  He continued to be on dual antiplatelet therapy with aspirin and Plavix.  He also takes HCTZ 12.5 mg every other day.  He is on levothyroxine for hypothyroidism.  He takes an oral Ellipta for his COPD.   Last saw him in September 2019.  At that time he had sinus bradycardia with a ventricular rate at 47 with left bundle branch block and Toprol was reduced.  [name] ultrasound in April 2019 revealed abdominal aorta measuring 3.3 cm which was not significantly changed compared to his prior examinations.  [name] in June 2019 showed a total cholesterol 122, LDL 73.  He developed COVID-19 infection on April 1 and was  hospitalized from April 5 through September 20, 2018 with significant Ona and bilateral patchy areas of airspace disease consistent with COVID-19 infection.  He was treated with Plaquenil x5 days, azithromycin x8 days and received Actemra on admission.  He felt markedly fatigued following his hospitalization.  He ultimately tested negative and apparently has a strong antibody response.  I  saw him on March 03, 2019 at which time he denied any chest pain.   At times he noted a very sharp musculoskeletal like short-lived chest pain.  He has underlying COPD and continues to have some exertional dyspnea.  He had been recently evaluated Patricia Nettle, NP pulmonary division and he sees Matthew. Elsworth Soho for his primary pulmonary care.    I last saw him in November 2020 after he had called the office and requested to be evaluated sooner due to his recurrent episodes of twinges in his chest.  These typically are nonexertional.  Over the last several days he had  been using a leaf blower which was strapped onto his back and he had pulldown straps over his chest and he was blowing significant amount of leaves.  He began to notice these twinges of chest pain which would occur typically while he was sitting and specifically were not exertionally precipitated.  At that time, I felt his chest pain was musculoskeletal in etiology.  Since I last saw him, he has continued to do well.  He does get some shortness of breath particularly if he over does it working on his yard and weed eating.  He had continue to be on long-term aspirin/Plavix but for some reason the pharmacy never renewed his medication and for the last 2 months he has been off clopidogrel.  He has continued to be on isosorbide 60 mg, losartan 50 mg, metoprolol succinate 25 mg and HCTZ 12.5 mg daily both for blood pressure and his CAD.  He is on rosuvastatin for hyperlipidemia and levothyroxine with his dose recently increased by his primary provider to 300 mcg daily.  He  denies recurrent anginal symptoms.  He presents for evaluation.  Past Medical History:  Diagnosis Date  . AAA (abdominal aortic aneurysm) (HCC)    mild; doppler 08/29/12-3.4x3.7  . Arthritis    "hands, back" (09/28/2016)  . CAD (coronary artery disease)    echo 07/26/12-EF 55-60%; myoview 07/26/12-low normal wall motion with mild apical hypokinesis as well a septal wall motion consistent with LBBB  . Carotid artery occlusion   . Chronic lower back pain   . Cutaneous lupus erythematosus    [name], [name]  . Dementia ([place])    "moderate; dx'd in ~ 2011" (09/28/2016)  . Dermatitis   . Emphysema   . GERD (gastroesophageal reflux disease)   . H/O hiatal hernia   . Headache(784.0)   . History of palpitations   . HTN (hypertension)   . Hyperlipidemia   . Hypothyroidism    HX OF RADIOACTIVE IODINE   . Memory loss    d/t mini stroke  . Mini stroke ([place]) ~ 2011  . Myocardial infarction ([place]) 09/2007   PCI to RCA  . Shortness of breath    WITH EXERTION  . Sleep apnea    sleep study 12/14/06 ahi 16.89, during rem 12.3, supine ahi 57.49; cpap 02/22/07-c-flex of 3 at 13cm h2o; PT STATES HE DOES NOT USE HIS CPAP EVERY NIGHT  . Sleep apnea    "aien't worn mask in 3 years" (09/28/2016)  . Stroke [facility])    PT STATES NEUROLOGIST TOLD HIM HE HAD HAD MINI STROKE  . Thyroid disease    hypothyroidism    Past Surgical History:  Procedure Laterality Date  . balloon atherotomy     LAD  . BICEPS TENDON REPAIR Right 09/25/2016   "cleaned out spurs and arthritis" (09/28/2016)  . CARDIAC CATHETERIZATION  08/11/11   EF50-55%, small AAA, no signifiant stent restenosis in RCA , no restenosis of PTCA of LAD, medical therapy  . CARDIAC CATHETERIZATION  09/27/06   cutting balloon arthrotomy of LAD stenosis with dilatation of a 3.5x62m cutting balloon  . CARDIAC CATHETERIZATION  01/07/09   50-60% progressive dz beyond the stented segment in the  RCA , LAD intervention remained patent  . CORONARY ANGIOPLASTY WITH STENT PLACEMENT  09/24/06   PTCA/stent of RCA with a 3.5x271mTaxus DES post dilated to 3.969m. CORONARY ANGIOPLASTY WITH STENT PLACEMENT Right 09/2007  .  ESOPHAGEAL MANOMETRY N/A 08/04/2013   Procedure: ESOPHAGEAL MANOMETRY (EM);  Surgeon: Beryle Beams, MD;  Location: [facility] ENDOS[facility];  Service: Endoscopy;  Laterality: N/A;  . HERNIA REPAIR    . HIATAL HERNIA REPAIR  2011?  Marland Kitchen LEFT HEART CATH AND CORONARY ANGIOGRAPHY N/A 10/02/2016   Procedure: Left Heart Cath and Coronary Angiography;  Surgeon: Leonie Man, MD;  Location: [employer] [facility] CV LAB;  Service: Cardiovascular;  Laterality: N/A;  . LEFT HEART CATHETERIZATION WITH CORONARY ANGIOGRAM N/A 08/11/2011   Procedure: LEFT HEART CATHETERIZATION WITH CORONARY ANGIOGRAM;  Surgeon: Troy Sine, MD;  Location: [facility] CATH LAB;  Service: Cardiovascular;  Laterality: N/A;  . SHOULDER SURGERY Right     Allergies  Allergen Reactions  . Niaspan [Niacin] Rash  . Septra [Sulfamethoxazole-Trimethoprim] Rash  . Simcor [Niacin-Simvastatin Er] Rash  . Zocor [Simvastatin] Rash    Current Outpatient Medications  Medication Sig Dispense Refill  . ANORO ELLIPTA 62.5-25 MCG/INH AEPB TAKE 1 PUFF BY MOUTH EVERY DAY 60 each 5  . ASPIRIN LOW DOSE 81 MG EC tablet TAKE 1 TABLET BY MOUTH EVERY DAY 90 tablet 9  . clopidogrel (PLAVIX) 75 MG tablet Take 1 tablet (75 mg total) by mouth daily. 90 tablet 3  . cyclobenzaprine (FLEXERIL) 10 MG tablet Take 10 mg by mouth 2 (two) times daily as needed for muscle spasms.   0  . hydrochlorothiazide (MICROZIDE) 12.5 MG capsule TAKE 1 CAPSULE (12.5 MG TOTAL) BY MOUTH EVERY OTHER DAY. 45 capsule 3  . HYDROcodone-acetaminophen (NORCO) 7.5-325 MG tablet Take 1 tablet by mouth every 6 (six) hours as needed for moderate pain or severe pain.     . isosorbide mononitrate (IMDUR) 60 MG 24 hr tablet TAKE 1.5 TABLETS (90 MG TOTAL) BY MOUTH DAILY. 135 tablet 3  . losartan  (COZAAR) 50 MG tablet Take 1 tablet (50 mg total) by mouth daily. 90 tablet 3  . methocarbamol (ROBAXIN) 500 MG tablet Take 500 mg by mouth every 6 (six) hours as needed for muscle spasms.     . metoprolol succinate (TOPROL-XL) 25 MG 24 hr tablet Take 0.5 tablets (12.5 mg total) by mouth daily. 90 tablet 3  . Omega-3 Fatty Acids (FISH OIL) 1200 MG CAPS Take 1,200 mg by mouth 2 (two) times daily.     . rosuvastatin (CRESTOR) 5 MG tablet Take 5 mg by mouth daily.    Marland Kitchen SYNTHROID 300 MCG tablet Take 300 mcg by mouth daily.     No current facility-administered medications for this visit.    Socially he is married has 3 children. There is a remote tobacco history. He has tried to limit his sodium intake.  His wife also is my patient was diagnosed with recent breast cancer.  ROS General: Negative; No fevers, chills, or night sweats;  HEENT: Negative; No changes in vision or hearing, sinus congestion, difficulty swallowing Pulmonary: Positive for COPD, with occasional wheezing;  mild shortness of breath with uphill walking Cardiovascular: See history of present illness GI: Hiatal hernia symptoms has resolved  since his Nissen fundoplication; No nausea, vomiting, diarrhea, or abdominal pain GU: Negative; No dysuria, hematuria, or difficulty voiding Musculoskeletal: Negative; no myalgias, joint pain, or weakness Hematologic/Oncology: Negative; no easy bruising, bleeding Endocrine: History of hypothyroidism Neuro: Negative; no changes in balance, headaches; occasional numbness in his hands. Skin: Negative; No rashes or skin lesions Psychiatric: Negative; No behavioral problems, depression Sleep:   Positive for increased upper airway resistance;no bruxism, restless legs, hypnogognic hallucinations, no cataplexy Other comprehensive  14 point system review is negative.   PE BP 118/62   Pulse (!) 44   Ht '5\' 8"'  (1.727 m)   Wt 206 lb (93.4 kg)   SpO2 97%   BMI 31.32 kg/m    Repeat blood pressure  by me 130/64  Wt Readings from Last 3 Encounters:  02/20/20 206 lb (93.4 kg)  04/21/19 206 lb 9.6 oz (93.7 kg)  03/03/19 207 lb (93.9 kg)   General: Alert, oriented, no distress.  Skin: normal turgor, no rashes, warm and dry HEENT: Normocephalic, atraumatic. Pupils equal round and reactive to light; sclera anicteric; extraocular muscles intact;  Nose without nasal septal hypertrophy Mouth/Parynx benign; Mallinpatti scale 3 Neck: No JVD, no carotid bruits; normal carotid upstroke Lungs: clear to ausculatation and percussion; no wheezing or rales Chest wall: without tenderness to palpitation Heart: PMI not displaced, RRR, s1 s2 normal, 1/6 systolic murmur, no diastolic murmur, no rubs, gallops, thrills, or heaves Abdomen: soft, nontender; no hepatosplenomehaly, BS+; abdominal aorta nontender and not dilated by palpation. Back: no CVA tenderness Pulses 2+ Musculoskeletal: full range of motion, normal strength, no joint deformities Extremities: no clubbing cyanosis or edema, Homan's sign negative  Neurologic: grossly nonfocal; Cranial nerves grossly wnl Psychologic: Normal mood and affect   ECG (independently read by me): Marked Sinus Bradycardia at 44, LBBB  April 21, 2019 ECG (independently read by me): Normal sinus rhythm at 65 bpm.  Left bundle branch block.  QTc interval 453 ms  September 2020 ECG (independently read by me): Normal sinus rhythm at 61 bpm.  Left axis deviation, left bundle branch block.  September 2019 ECG (independently read by me): Sinus bradycardia 47 bpm.  Left bundle branch block with repolarization changes.  October 2018 ECG (independently read by me): Sinus bradycardia 52 bpm.  Nonspecific IVCD.  Possible lateral Q waves.  QTc interval 451 ms.  May 2018 ECG (independently read by me): Sinus bradycardia at 47 bpm.  Left bundle branch block with repolarization changes.  Nose other significant ST changes.  March 2018 ECG (independently read by me):Normal  sinus rhythm at 68 bpm.  Left bundle branch block with repolarization changes.  Her interval 166 ms; QTc interval 467 ms.  December 2016 ECG (independently read by me): Sinus bradycardia 58 bpm.  Left bundle branch block.  PR interval 148 ms.    June 2016 ECG (independently read by me): Sinus bradycardia 52 bpm.  Left bundle branch block with repolarization changes.  April 2016 ECG (independently read by me): Sinus rhythm at 62 bpm.  PVC.  QTc interval 475 ms.  September 2015 ECG (independently read by me): Sinus bradycardia at 46 beats per minute.  Left bundle branch block with repolarization changes  05/13/2013 ECG: Sinus bradycardia 50 beats per minute. beats per minute with mild left axis deviation and left bundle branch block. There is complete resolution of prior ectopy.   LABS  BMP Latest Ref Rng & Units 10/15/2018 09/19/2018 09/18/2018  Glucose 70 - 99 mg/dL 103(H) 115(H) 104(H)  BUN 8 - 23 mg/dL 14 26(H) 27(H)  Creatinine 0.61 - 1.24 mg/dL 1.18 1.13 1.29(H)  Sodium 135 - 145 mmol/L 140 134(L) 132(L)  Potassium 3.5 - 5.1 mmol/L 4.6 4.1 4.0  Chloride 98 - 111 mmol/L 107 95(L) 92(L)  CO2 22 - 32 mmol/L '26 30 31  ' Calcium 8.9 - 10.3 mg/dL 8.9 8.3(L) 8.2(L)     Hepatic Function Latest Ref Rng & Units 09/08/2018 09/06/2018 10/03/2016  Total Protein 6.5 - 8.1 g/dL  6.5 7.4 5.7(L)  Albumin 3.5 - 5.0 g/dL 2.9(L) 3.4(L) 2.8(L)  AST 15 - 41 U/L 92(H) 70(H) 31  ALT 0 - 44 U/L 49(H) 44 25  Alk Phosphatase 38 - 126 U/L 67 81 90  Total Bilirubin 0.3 - 1.2 mg/dL 0.8 0.8 0.5    CBC Latest Ref Rng & Units 10/15/2018 09/19/2018 09/18/2018  WBC 4.0 - 10.5 K/uL 10.5 10.1 11.6(H)  Hemoglobin 13.0 - 17.0 g/dL 14.2 13.8 14.1  Hematocrit 39 - 52 % 45.1 42.4 43.7  Platelets 150 - 400 K/uL 333 377 408(H)   Lab Results  Component Value Date   TSH <0.010 (L) 10/02/2016    BNP    Component Value Date/Time   PROBNP 20.0 11/03/2013 1737    Lipid Panel     Component Value Date/Time   CHOL 117  09/01/2016 0926   TRIG 94 09/01/2016 0926   HDL 32 (L) 09/01/2016 0926   CHOLHDL 3.7 09/01/2016 0926   VLDL 19 09/01/2016 0926   LDLCALC 66 09/01/2016 0926     RADIOLOGY: No results found.  IMPRESSION:  1. Atypical chest pain   2. Coronary artery disease due to lipid rich plaque   3. Bradycardia   4. Left bundle branch block   5. Hyperlipidemia with target LDL less than 70   6. Chronic obstructive pulmonary disease, unspecified COPD type ([place])     ASSESSMENT AND PLAN: Matthew. Rae Sutcliffe is a 69 year old white male who is 12 years status post suffering an  inferior ST segment elevation myocardial infarction and  successful stenting of his RCA with  staged cutting balloon to high grade LAD stenosis in April 2009.  A  nuclear perfusion study in 2016 was low risk and showed probable attenuation artifact versus possible left bundle branch block attenuation.  Importantly, there was no evidence for ischemia.  Ejection fraction was 45%, which corroborates with his EF estimate on his echo Doppler study. A  preoperative echo Doppler study showed an EF of 45-50% and he had grade 1 diastolic dysfunction.  He tolerated his shoulder surgery well but several days later developed increasing shortness of breath leading to hospitalization , which most likely was related to mild volume overload.  His troponins were positive with a flat plateau.  After undergoing nuclear imaging he was referred for definitive cardiac catheterization which demonstrated  stable CAD with a widely patent RCA stent with mild nonobstructive disease.  He has experienced episodes of musculoskeletal chest wall pain particularly when he overdoes it doing weed eating.  When he was seen in November 2020 he had been doing that excessively.  Approximately 3 weeks ago he had experienced some increased shortness of breath again when he overdid it.  He denies any anginal symptoms.  At times he does note some fatigue.  His ECG today reveals sinus  bradycardia with heart rate of 44.  He has chronic left bundle branch block which is stable.  I am recommending he reduce his metoprolol succinate to 12.5 mg daily.  With his CAD, I have recommended long-term DAPT and for this reason I have recommended reinstitution of clopidogrel which inadvertently was not renewed by his pharmacy.  He continues to be on rosuvastatin for hyperlipidemia.  Target LDL is less than 70.  He gets laboratory at [facility].  He continues to be followed by pulmonary for his underlying lung disease and continues to be on Anoro Ellipta.  He has not had any sequelae from his Covid infection in  March/April 2020 at which time he required over a week in the hospital.  There is a remote tobacco history but fortunately he quit smoking many years ago.  I will see him in 6 months for follow-up evaluation or sooner as needed.  Troy Sine, MD, [facility]  02/22/2020 5:10 PM

## 2020-02-20 NOTE — Patient Instructions (Signed)
Medication Instructions:  DECREASE YOUR METOPROLOL TO 12.5MG  DAILY  *If you need a refill on your cardiac medications before your next appointment, please call your pharmacy*   Follow-Up: At [facility], you and your health needs are our priority.  As part of our continuing mission to provide you with exceptional heart care, we have created designated Provider Care Teams.  These Care Teams include your primary Cardiologist (physician) and Advanced Practice Providers (APPs -  Physician Assistants and Nurse Practitioners) who all work together to provide you with the care you need, when you need it.  We recommend signing up for the patient portal called "MyChart".  Sign up information is provided on this After Visit Summary.  MyChart is used to connect with patients for Virtual Visits (Telemedicine).  Patients are able to view lab/test results, encounter notes, upcoming appointments, etc.  Non-urgent messages can be sent to your provider as well.   To learn more about what you can do with MyChart, go to [email or web address].    Your next appointment:   6 month(s)  The format for your next appointment:   In Person  Provider:   Nicki Guadalajara, MD

## 2020-02-22 ENCOUNTER — Encounter: Payer: Self-pay | Admitting: Cardiovascular Disease

## 2020-03-10 ENCOUNTER — Other Ambulatory Visit: Payer: Self-pay | Admitting: Cardiovascular Disease

## 2020-04-05 ENCOUNTER — Other Ambulatory Visit: Payer: Self-pay | Admitting: Cardiovascular Disease

## 2020-04-14 ENCOUNTER — Other Ambulatory Visit: Payer: Self-pay | Admitting: Cardiovascular Disease

## 2020-05-12 DIAGNOSIS — Z79891 Long term (current) use of opiate analgesic: Secondary | ICD-10-CM | POA: Diagnosis not present

## 2020-05-12 DIAGNOSIS — E785 Hyperlipidemia, unspecified: Secondary | ICD-10-CM | POA: Diagnosis not present

## 2020-05-12 DIAGNOSIS — E039 Hypothyroidism, unspecified: Secondary | ICD-10-CM | POA: Diagnosis not present

## 2020-05-12 DIAGNOSIS — Z125 Encounter for screening for malignant neoplasm of prostate: Secondary | ICD-10-CM | POA: Diagnosis not present

## 2020-05-12 DIAGNOSIS — I10 Essential (primary) hypertension: Secondary | ICD-10-CM | POA: Diagnosis not present

## 2020-05-19 DIAGNOSIS — E039 Hypothyroidism, unspecified: Secondary | ICD-10-CM | POA: Diagnosis not present

## 2020-05-19 DIAGNOSIS — I2510 Atherosclerotic heart disease of native coronary artery without angina pectoris: Secondary | ICD-10-CM | POA: Diagnosis not present

## 2020-05-19 DIAGNOSIS — Z0000 Encounter for general adult medical examination without abnormal findings: Secondary | ICD-10-CM | POA: Diagnosis not present

## 2020-05-19 DIAGNOSIS — I10 Essential (primary) hypertension: Secondary | ICD-10-CM | POA: Diagnosis not present

## 2020-05-19 DIAGNOSIS — R5382 Chronic fatigue, unspecified: Secondary | ICD-10-CM | POA: Diagnosis not present

## 2020-05-19 DIAGNOSIS — G301 Alzheimer's disease with late onset: Secondary | ICD-10-CM | POA: Diagnosis not present

## 2020-05-19 DIAGNOSIS — I639 Cerebral infarction, unspecified: Secondary | ICD-10-CM | POA: Diagnosis not present

## 2020-05-19 DIAGNOSIS — J449 Chronic obstructive pulmonary disease, unspecified: Secondary | ICD-10-CM | POA: Diagnosis not present

## 2020-05-19 DIAGNOSIS — I719 Aortic aneurysm of unspecified site, without rupture: Secondary | ICD-10-CM | POA: Diagnosis not present

## 2020-05-19 DIAGNOSIS — E785 Hyperlipidemia, unspecified: Secondary | ICD-10-CM | POA: Diagnosis not present

## 2020-05-19 DIAGNOSIS — L932 Other local lupus erythematosus: Secondary | ICD-10-CM | POA: Diagnosis not present

## 2020-06-28 DIAGNOSIS — B379 Candidiasis, unspecified: Secondary | ICD-10-CM | POA: Diagnosis not present

## 2020-07-18 DIAGNOSIS — Z7982 Long term (current) use of aspirin: Secondary | ICD-10-CM | POA: Diagnosis not present

## 2020-07-18 DIAGNOSIS — Z955 Presence of coronary angioplasty implant and graft: Secondary | ICD-10-CM | POA: Diagnosis not present

## 2020-07-18 DIAGNOSIS — Z79899 Other long term (current) drug therapy: Secondary | ICD-10-CM | POA: Diagnosis not present

## 2020-07-18 DIAGNOSIS — Z888 Allergy status to other drugs, medicaments and biological substances status: Secondary | ICD-10-CM | POA: Diagnosis not present

## 2020-07-18 DIAGNOSIS — Z7902 Long term (current) use of antithrombotics/antiplatelets: Secondary | ICD-10-CM | POA: Diagnosis not present

## 2020-07-18 DIAGNOSIS — E079 Disorder of thyroid, unspecified: Secondary | ICD-10-CM | POA: Diagnosis not present

## 2020-07-18 DIAGNOSIS — R21 Rash and other nonspecific skin eruption: Secondary | ICD-10-CM | POA: Diagnosis not present

## 2020-07-18 DIAGNOSIS — L538 Other specified erythematous conditions: Secondary | ICD-10-CM | POA: Diagnosis not present

## 2020-07-26 ENCOUNTER — Other Ambulatory Visit: Payer: Self-pay | Admitting: Adult Health

## 2020-07-27 ENCOUNTER — Telehealth: Payer: Self-pay | Admitting: Pulmonary Disease

## 2020-07-27 MED ORDER — ANORO ELLIPTA 62.5-25 MCG/INH IN AEPB: INHALATION_SPRAY | RESPIRATORY_TRACT | 0 refills | Status: DC

## 2020-07-27 NOTE — Telephone Encounter (Signed)
LMTCB. Medication pended until we speak with patient. Please only send a limited supply until patient makes his appt. Last appt was in 2020.

## 2020-07-27 NOTE — Telephone Encounter (Signed)
Call returned to patient, confirmed DOB. Confirmed appt date and time. Made aware further refills are contingent upon him making that MD appt. Voiced understanding. Refill sent.   Nothing further needed at this time.

## 2020-07-28 DIAGNOSIS — Z888 Allergy status to other drugs, medicaments and biological substances status: Secondary | ICD-10-CM | POA: Diagnosis not present

## 2020-07-28 DIAGNOSIS — Z79899 Other long term (current) drug therapy: Secondary | ICD-10-CM | POA: Diagnosis not present

## 2020-07-28 DIAGNOSIS — L538 Other specified erythematous conditions: Secondary | ICD-10-CM | POA: Diagnosis not present

## 2020-07-28 DIAGNOSIS — E079 Disorder of thyroid, unspecified: Secondary | ICD-10-CM | POA: Diagnosis not present

## 2020-07-28 DIAGNOSIS — Z7902 Long term (current) use of antithrombotics/antiplatelets: Secondary | ICD-10-CM | POA: Diagnosis not present

## 2020-07-28 DIAGNOSIS — Z7982 Long term (current) use of aspirin: Secondary | ICD-10-CM | POA: Diagnosis not present

## 2020-07-28 DIAGNOSIS — R21 Rash and other nonspecific skin eruption: Secondary | ICD-10-CM | POA: Diagnosis not present

## 2020-07-28 DIAGNOSIS — I252 Old myocardial infarction: Secondary | ICD-10-CM | POA: Diagnosis not present

## 2020-07-28 DIAGNOSIS — Z955 Presence of coronary angioplasty implant and graft: Secondary | ICD-10-CM | POA: Diagnosis not present

## 2020-07-28 DIAGNOSIS — Z79890 Hormone replacement therapy (postmenopausal): Secondary | ICD-10-CM | POA: Diagnosis not present

## 2020-07-28 DIAGNOSIS — K611 Rectal abscess: Secondary | ICD-10-CM | POA: Diagnosis not present

## 2020-07-29 DIAGNOSIS — K611 Rectal abscess: Secondary | ICD-10-CM | POA: Diagnosis not present

## 2020-07-30 DIAGNOSIS — L0231 Cutaneous abscess of buttock: Secondary | ICD-10-CM | POA: Diagnosis not present

## 2020-08-02 ENCOUNTER — Ambulatory Visit: Payer: BC Managed Care – PPO | Admitting: Pulmonary Disease

## 2020-08-22 DIAGNOSIS — Z79890 Hormone replacement therapy (postmenopausal): Secondary | ICD-10-CM | POA: Diagnosis not present

## 2020-08-22 DIAGNOSIS — B368 Other specified superficial mycoses: Secondary | ICD-10-CM | POA: Diagnosis not present

## 2020-08-22 DIAGNOSIS — G4733 Obstructive sleep apnea (adult) (pediatric): Secondary | ICD-10-CM | POA: Diagnosis not present

## 2020-08-22 DIAGNOSIS — Z79899 Other long term (current) drug therapy: Secondary | ICD-10-CM | POA: Diagnosis not present

## 2020-08-22 DIAGNOSIS — M3310 Other dermatopolymyositis, organ involvement unspecified: Secondary | ICD-10-CM | POA: Diagnosis not present

## 2020-08-22 DIAGNOSIS — E079 Disorder of thyroid, unspecified: Secondary | ICD-10-CM | POA: Diagnosis not present

## 2020-08-22 DIAGNOSIS — Z7982 Long term (current) use of aspirin: Secondary | ICD-10-CM | POA: Diagnosis not present

## 2020-08-22 DIAGNOSIS — Z888 Allergy status to other drugs, medicaments and biological substances status: Secondary | ICD-10-CM | POA: Diagnosis not present

## 2020-08-22 DIAGNOSIS — R21 Rash and other nonspecific skin eruption: Secondary | ICD-10-CM | POA: Diagnosis not present

## 2020-08-22 DIAGNOSIS — Z7901 Long term (current) use of anticoagulants: Secondary | ICD-10-CM | POA: Diagnosis not present

## 2020-08-25 ENCOUNTER — Other Ambulatory Visit: Payer: Self-pay

## 2020-08-25 ENCOUNTER — Encounter: Payer: Self-pay | Admitting: Cardiovascular Disease

## 2020-08-25 ENCOUNTER — Ambulatory Visit (INDEPENDENT_AMBULATORY_CARE_PROVIDER_SITE_OTHER): Payer: BC Managed Care – PPO | Admitting: Cardiovascular Disease

## 2020-08-25 VITALS — BP 142/66 | HR 60 | Ht 68.0 in | Wt 204.6 lb

## 2020-08-25 DIAGNOSIS — I2510 Atherosclerotic heart disease of native coronary artery without angina pectoris: Secondary | ICD-10-CM | POA: Diagnosis not present

## 2020-08-25 DIAGNOSIS — E785 Hyperlipidemia, unspecified: Secondary | ICD-10-CM

## 2020-08-25 DIAGNOSIS — Z79899 Other long term (current) drug therapy: Secondary | ICD-10-CM

## 2020-08-25 DIAGNOSIS — I10 Essential (primary) hypertension: Secondary | ICD-10-CM

## 2020-08-25 NOTE — Progress Notes (Incomplete)
Patient ID: Matthew Evans, male   DOB: 1950-08-17, 70 y.o.   MRN: [id number]      HPI: Matthew Evans, is a 70 y.o. male who presents to the office today for a 6 month cardiology evaluation.   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 He underwent staged cutting balloon to a high grade LAD stenosis which involved multiple branches and stenting was not done to reduce potential for jailing of his multiple branches. In August 2010 he was found to have 50-60% stenosis beyond his RCA stent in the LAD intervention site remained patent. He has a documented abdominal aortic aneurysm and a followup evaluation with Doppler imaging  on 08/29/2012 was essentially unchanged from one year previously and showed a measurement of 3.4x3.7 cm.  Additional problems include COPD, hypertension, hypothyroidism, hyperlipidemia, GERD. A nuclear study in February 2014 remained low risk and demonstrated mild apical hypokinesis with low normal wall motion without ischemia. An echo Doppler study in February 2014 showed mild LVH with grade 1 diastolic dysfunction and normal systolic function. He did have aortic valve thickening without stenosis and mild left atrial dilatation. Had normal pulmonary pressures.  He has a history of palpitations which has been treated with low dose  beta blocker therapy.He has COPD. He recently has been on Anora in addition to when necessary albuterol.  He underwent a Nissen fundoplication hiatal hernia surgery by Dr. Rosendo Evans in May, 2015.  Since that time, he has lost approximately 20 pounds.  In 2015 A follow-up abdominal aortic ultrasound his revealed 3.6 x 3.3 cm with a small amount of atherosclerosis visualized, which was not hemodynamically significant.  This was not significantly changed from one year previously.  He had an emergency room evaluation on 09/15/2014.  He had been working aggressively at home in his yard  intensely and overdid it.  He presented with presyncope and also had mild dyspnea.  He had noticed some mild dyspnea on exertion for the past several months.  In the emergency room he was noted to have an irregular heart rate but sinus rhythm with frequent PVCs.  PVCs were not conducted so his resting pulse radially was bradycardic.    On 09/30/2014 an echo Doppler showed an EF of 40-45% with diffuse hypokinesis and grade 2 diastolic dysfunction.  There was mitral annular calcification.  His left atrium was mildly dilated.  Right ventricle was normal.  A nuclear perfusion study done the same day was interpreted as low risk.  He has left bundle branch block.  There was a small mild fixed anterior defect consistent with soft tissue attenuation versus possible left bundle branch block abnormality.  There was no ischemia.  A follow-up abdominal ultrasound in June 2016 showed an aortic aneurysm of 3.7 x 3.5, which was not significantly changed.  At times, he has noticed rare episodes of chest pain but typically most of his symptoms are related to shortness of breath with activity.  He was supposed to be taking isosorbide 90 mg daily, but has only been taking 60 mg.  He now sees MatthewAlva for his pulmonary care.  Matthew Evans has been followed for primary care by Matthew Evans.  I had recently seen him prior to him undergoing shoulder surgery by Matthew Evans.which was done on 09/25/2016.  Prior to giving him  operative clearance I recommended that he undergo a preoperativean echo Doppler study. This was done on 09/01/2016 and showed  his echo EF had slightly improved and was now in the 45-50% range. He apparently tolerated his shoulder surgery well but after going home 2 days later developed increasing shortness of breath.  He presented to the hospital.  In the hospital, he again had another echo which showed an EF of 45-50%. He was referred for a nuclear stress test which raised concern for possible ischemia in the inferior  wall and probable scar in the anteroseptal wall.  As result, he underwent cardiac catheterization was done by Matthew Evans, which revealed a patent RCA stent and 30% LAD and circumflex narrowing.  Angiographically there was no obvious culprit lesion to explain his symptoms.  When I last saw him he denied any chest pain or shortness of breath.  He was walking daily up to a mile.  He has remote history of obstructive sleep apnea but had not use CPAP in over 3 years.  He stated that his sleep was poor and was wondering if he needs to reinstitute to to treatment.    He underwent a sleep study in 01/04/2017.  No significant sleep apnea was demonstrated with an overall AHI of 1.1.  AHI during REM sleep was 1.3.  There was minimum oxygen desaturation to 88%.  He had abnormal sleep architecture with absence of slow-wave sleep and prolonged latency to rems sleep.  It was felt that he had mild increased upper airway resistance syndrome without definitive obstructive sleep apnea.  Over the past several months.  He is sleeping better.  He is now seeing Matthew Evans for primary care.   I  saw him in October 2018, at which time he remained  stable from a cardiac standpoint.    He was on Toprol-XL 37.5 mg daily, losartan 75 mg daily, isosorbide 60 mg in the morning and amlodipine 5 mg for blood pressure control.  He continued to be on dual antiplatelet therapy with aspirin and Plavix.  He also takes HCTZ 12.5 mg every other day.  He is on levothyroxine for hypothyroidism.  He takes an oral Ellipta for his COPD.   Last saw him in September 2019.  At that time he had sinus bradycardia with a ventricular rate at 47 with left bundle branch block and Toprol was reduced.  Matthew Evans ultrasound in April 2019 revealed abdominal aorta measuring 3.3 cm which was not significantly changed compared to his prior examinations.  [name] in June 2019 showed a total cholesterol 122, LDL 73.  He developed COVID-19 infection on April 1 and was  hospitalized from April 5 through September 20, 2018 with significant Ona and bilateral patchy areas of airspace disease consistent with COVID-19 infection.  He was treated with Plaquenil x5 days, azithromycin x8 days and received Actemra on admission.  He felt markedly fatigued following his hospitalization.  He ultimately tested negative and apparently has a strong antibody response.  I  saw him on March 03, 2019 at which time he denied any chest pain.   At times he noted a very sharp musculoskeletal like short-lived chest pain.  He has underlying COPD and continues to have some exertional dyspnea.  He had been recently evaluated Patricia Nettle, NP pulmonary division and he sees Dr. Elsworth Soho for his primary pulmonary care.    I last saw him in November 2020 after he had called the office and requested to be evaluated sooner due to his recurrent episodes of twinges in his chest.  These typically are nonexertional.  Over the last several days he had  been using a leaf blower which was strapped onto his back and he had pulldown straps over his chest and he was blowing significant amount of leaves.  He began to notice these twinges of chest pain which would occur typically while he was sitting and specifically were not exertionally precipitated.  At that time, I felt his chest pain was musculoskeletal in etiology.  Since I last saw him, he has continued to do well.  He does get some shortness of breath particularly if he over does it working on his yard and weed eating.  He had continue to be on long-term aspirin/Plavix but for some reason the pharmacy never renewed his medication and for the last 2 months he has been off clopidogrel.  He has continued to be on isosorbide 60 mg, losartan 50 mg, metoprolol succinate 25 mg and HCTZ 12.5 mg daily both for blood pressure and his CAD.  He is on rosuvastatin for hyperlipidemia and levothyroxine with his dose recently increased by his primary provider to 300 mcg daily.  He  denies recurrent anginal symptoms.  He presents for evaluation.  Past Medical History:  Diagnosis Date  . AAA (abdominal aortic aneurysm) (HCC)    mild; doppler 08/29/12-3.4x3.7  . Arthritis    "hands, back" (09/28/2016)  . CAD (coronary artery disease)    echo 07/26/12-EF 55-60%; myoview 07/26/12-low normal wall motion with mild apical hypokinesis as well a septal wall motion consistent with LBBB  . Carotid artery occlusion   . Chronic lower back pain   . Cutaneous lupus erythematosus    [name], [name]  . Dementia ([place])    "moderate; dx'd in ~ 2011" (09/28/2016)  . Dermatitis   . Emphysema   . GERD (gastroesophageal reflux disease)   . H/O hiatal hernia   . Headache(784.0)   . History of palpitations   . HTN (hypertension)   . Hyperlipidemia   . Hypothyroidism    HX OF RADIOACTIVE IODINE   . Memory loss    d/t mini stroke  . Mini stroke ([place]) ~ 2011  . Myocardial infarction ([place]) 09/2007   PCI to RCA  . Shortness of breath    WITH EXERTION  . Sleep apnea    sleep study 12/14/06 ahi 16.89, during rem 12.3, supine ahi 57.49; cpap 02/22/07-c-flex of 3 at 13cm h2o; PT STATES HE DOES NOT USE HIS CPAP EVERY NIGHT  . Sleep apnea    "aien't worn mask in 3 years" (09/28/2016)  . Stroke [facility])    PT STATES NEUROLOGIST TOLD HIM HE HAD HAD MINI STROKE  . Thyroid disease    hypothyroidism    Past Surgical History:  Procedure Laterality Date  . balloon atherotomy     LAD  . BICEPS TENDON REPAIR Right 09/25/2016   "cleaned out spurs and arthritis" (09/28/2016)  . CARDIAC CATHETERIZATION  08/11/11   EF50-55%, small AAA, no signifiant stent restenosis in RCA , no restenosis of PTCA of LAD, medical therapy  . CARDIAC CATHETERIZATION  09/27/06   cutting balloon arthrotomy of LAD stenosis with dilatation of a 3.5x62m cutting balloon  . CARDIAC CATHETERIZATION  01/07/09   50-60% progressive dz beyond the stented segment in the  RCA , LAD intervention remained patent  . CORONARY ANGIOPLASTY WITH STENT PLACEMENT  09/24/06   PTCA/stent of RCA with a 3.5x271mTaxus DES post dilated to 3.969m. CORONARY ANGIOPLASTY WITH STENT PLACEMENT Right 09/2007  .  ESOPHAGEAL MANOMETRY N/A 08/04/2013   Procedure: ESOPHAGEAL MANOMETRY (EM);  Surgeon: Beryle Beams, MD;  Location: [facility];  Service: Endoscopy;  Laterality: N/A;  . HERNIA REPAIR    . HIATAL HERNIA REPAIR  2011?  Marland Kitchen LEFT HEART CATH AND CORONARY ANGIOGRAPHY N/A 10/02/2016   Procedure: Left Heart Cath and Coronary Angiography;  Surgeon: Leonie Man, MD;  Location: [place] CV LAB;  Service: Cardiovascular;  Laterality: N/A;  . LEFT HEART CATHETERIZATION WITH CORONARY ANGIOGRAM N/A 08/11/2011   Procedure: LEFT HEART CATHETERIZATION WITH CORONARY ANGIOGRAM;  Surgeon: Troy Sine, MD;  Location: [facility] CATH LAB;  Service: Cardiovascular;  Laterality: N/A;  . SHOULDER SURGERY Right     Allergies  Allergen Reactions  . Niaspan [Niacin] Rash  . Septra [Sulfamethoxazole-Trimethoprim] Rash  . Simcor [Niacin-Simvastatin Er] Rash  . Zocor [Simvastatin] Rash    Current Outpatient Medications  Medication Sig Dispense Refill  . ASPIRIN ADULT LOW STRENGTH 81 MG EC tablet TAKE 1 TABLET BY MOUTH EVERY DAY 90 tablet 2  . clopidogrel (PLAVIX) 75 MG tablet Take 1 tablet (75 mg total) by mouth daily. 90 tablet 3  . cyclobenzaprine (FLEXERIL) 10 MG tablet Take 10 mg by mouth 2 (two) times daily as needed for muscle spasms.   0  . hydrochlorothiazide (MICROZIDE) 12.5 MG capsule TAKE 1 CAPSULE (12.5 MG TOTAL) BY MOUTH EVERY OTHER DAY. 45 capsule 3  . HYDROcodone-acetaminophen (NORCO) 7.5-325 MG tablet Take 1 tablet by mouth every 6 (six) hours as needed for moderate pain or severe pain.     . isosorbide mononitrate (IMDUR) 60 MG 24 hr tablet TAKE 1.5 TABLETS (90 MG TOTAL) BY MOUTH DAILY. 135 tablet 3  . losartan (COZAAR) 50 MG tablet TAKE 1 TABLET BY MOUTH EVERY DAY 90 tablet 2  .  methocarbamol (ROBAXIN) 500 MG tablet Take 500 mg by mouth every 6 (six) hours as needed for muscle spasms.     . metoprolol succinate (TOPROL-XL) 25 MG 24 hr tablet Take 0.5 tablets (12.5 mg total) by mouth daily. 90 tablet 3  . Omega-3 Fatty Acids (FISH OIL) 1200 MG CAPS Take 1,200 mg by mouth 2 (two) times daily.     . rosuvastatin (CRESTOR) 5 MG tablet Take 5 mg by mouth daily.    Marland Kitchen SYNTHROID 300 MCG tablet Take 300 mcg by mouth daily.    Marland Kitchen umeclidinium-vilanterol (ANORO ELLIPTA) 62.5-25 MCG/INH AEPB TAKE 1 PUFF BY MOUTH EVERY DAY 60 each 0   No current facility-administered medications for this visit.    Socially he is married has 3 children. There is a remote tobacco history. He has tried to limit his sodium intake.  His wife also is my patient was diagnosed with recent breast cancer.  ROS General: Negative; No fevers, chills, or night sweats;  HEENT: Negative; No changes in vision or hearing, sinus congestion, difficulty swallowing Pulmonary: Positive for COPD, with occasional wheezing;  mild shortness of breath with uphill walking Cardiovascular: See history of present illness GI: Hiatal hernia symptoms has resolved  since his Nissen fundoplication; No nausea, vomiting, diarrhea, or abdominal pain GU: Negative; No dysuria, hematuria, or difficulty voiding Musculoskeletal: Negative; no myalgias, joint pain, or weakness Hematologic/Oncology: Negative; no easy bruising, bleeding Endocrine: History of hypothyroidism Neuro: Negative; no changes in balance, headaches; occasional numbness in his hands. Skin: Negative; No rashes or skin lesions Psychiatric: Negative; No behavioral problems, depression Sleep:   Positive for increased upper airway resistance;no bruxism, restless legs, hypnogognic hallucinations, no cataplexy Other comprehensive  14 point system review is negative.   PE BP (!) 142/66   Ht 5\' 8"  (1.727 m)   Wt 204 lb 9.6 oz (92.8 kg)   BMI 31.11 kg/m    Repeat blood  pressure by me 130/64  Wt Readings from Last 3 Encounters:  08/25/20 204 lb 9.6 oz (92.8 kg)  02/20/20 206 lb (93.4 kg)  04/21/19 206 lb 9.6 oz (93.7 kg)     Physical Exam BP (!) 142/66   Ht 5\' 8"  (1.727 m)   Wt 204 lb 9.6 oz (92.8 kg)   BMI 31.11 kg/m  General: Alert, oriented, no distress.  Skin: normal turgor, no rashes, warm and dry HEENT: Normocephalic, atraumatic. Pupils equal round and reactive to light; sclera anicteric; extraocular muscles intact; Fundi ** Nose without nasal septal hypertrophy Mouth/Parynx benign; Mallinpatti scale Neck: No JVD, no carotid bruits; normal carotid upstroke Lungs: clear to ausculatation and percussion; no wheezing or rales Chest wall: without tenderness to palpitation Heart: PMI not displaced, RRR, s1 s2 normal, 1/6 systolic murmur, no diastolic murmur, no rubs, gallops, thrills, or heaves Abdomen: soft, nontender; no hepatosplenomehaly, BS+; abdominal aorta nontender and not dilated by palpation. Back: no CVA tenderness Pulses 2+ Musculoskeletal: full range of motion, normal strength, no joint deformities Extremities: no clubbing cyanosis or edema, Homan's sign negative  Neurologic: grossly nonfocal; Cranial nerves grossly wnl Psychologic: Normal mood and affect    General: Alert, oriented, no distress.  Skin: normal turgor, no rashes, warm and dry HEENT: Normocephalic, atraumatic. Pupils equal round and reactive to light; sclera anicteric; extraocular muscles intact;  Nose without nasal septal hypertrophy Mouth/Parynx benign; Mallinpatti scale 3 Neck: No JVD, no carotid bruits; normal carotid upstroke Lungs: clear to ausculatation and percussion; no wheezing or rales Chest wall: without tenderness to palpitation Heart: PMI not displaced, RRR, s1 s2 normal, 1/6 systolic murmur, no diastolic murmur, no rubs, gallops, thrills, or heaves Abdomen: soft, nontender; no hepatosplenomehaly, BS+; abdominal aorta nontender and not dilated by  palpation. Back: no CVA tenderness Pulses 2+ Musculoskeletal: full range of motion, normal strength, no joint deformities Extremities: no clubbing cyanosis or edema, Homan's sign negative  Neurologic: grossly nonfocal; Cranial nerves grossly wnl Psychologic: Normal mood and affect  ECG (independently read by me): NSR at 60; Nonspecific intraventricular block  February 20, 2020 ECG (independently read by me): Marked Sinus Bradycardia at 44, LBBB  April 21, 2019 ECG (independently read by me): Normal sinus rhythm at 65 bpm.  Left bundle branch block.  QTc interval 453 ms  September 2020 ECG (independently read by me): Normal sinus rhythm at 61 bpm.  Left axis deviation, left bundle branch block.  September 2019 ECG (independently read by me): Sinus bradycardia 47 bpm.  Left bundle branch block with repolarization changes.  October 2018 ECG (independently read by me): Sinus bradycardia 52 bpm.  Nonspecific IVCD.  Possible lateral Q waves.  QTc interval 451 ms.  May 2018 ECG (independently read by me): Sinus bradycardia at 47 bpm.  Left bundle branch block with repolarization changes.  Nose other significant ST changes.  March 2018 ECG (independently read by me):Normal sinus rhythm at 68 bpm.  Left bundle branch block with repolarization changes.  Her interval 166 ms; QTc interval 467 ms.  December 2016 ECG (independently read by me): Sinus bradycardia 58 bpm.  Left bundle branch block.  PR interval 148 ms.    June 2016 ECG (independently read by me): Sinus bradycardia 52 bpm.  Left bundle branch block with repolarization  changes.  April 2016 ECG (independently read by me): Sinus rhythm at 62 bpm.  PVC.  QTc interval 475 ms.  September 2015 ECG (independently read by me): Sinus bradycardia at 46 beats per minute.  Left bundle branch block with repolarization changes  05/13/2013 ECG: Sinus bradycardia 50 beats per minute. beats per minute with mild left axis deviation and left bundle  branch block. There is complete resolution of prior ectopy.   LABS  BMP Latest Ref Rng & Units 10/15/2018 09/19/2018 09/18/2018  Glucose 70 - 99 mg/dL 103(H) 115(H) 104(H)  BUN 8 - 23 mg/dL 14 26(H) 27(H)  Creatinine 0.61 - 1.24 mg/dL 1.18 1.13 1.29(H)  Sodium 135 - 145 mmol/L 140 134(L) 132(L)  Potassium 3.5 - 5.1 mmol/L 4.6 4.1 4.0  Chloride 98 - 111 mmol/L 107 95(L) 92(L)  CO2 22 - 32 mmol/L $RemoveB'26 30 31  '[id number]$ Calcium 8.9 - 10.3 mg/dL 8.9 8.3(L) 8.2(L)     Hepatic Function Latest Ref Rng & Units 09/08/2018 09/06/2018 10/03/2016  Total Protein 6.5 - 8.1 g/dL 6.5 7.4 5.7(L)  Albumin 3.5 - 5.0 g/dL 2.9(L) 3.4(L) 2.8(L)  AST 15 - 41 U/L 92(H) 70(H) 31  ALT 0 - 44 U/L 49(H) 44 25  Alk Phosphatase 38 - 126 U/L 67 81 90  Total Bilirubin 0.3 - 1.2 mg/dL 0.8 0.8 0.5    CBC Latest Ref Rng & Units 10/15/2018 09/19/2018 09/18/2018  WBC 4.0 - 10.5 K/uL 10.5 10.1 11.6(H)  Hemoglobin 13.0 - 17.0 g/dL 14.2 13.8 14.1  Hematocrit 39.0 - 52.0 % 45.1 42.4 43.7  Platelets 150 - 400 K/uL 333 377 408(H)   Lab Results  Component Value Date   TSH <0.010 (L) 10/02/2016    BNP    Component Value Date/Time   PROBNP 20.0 11/03/2013 1737    Lipid Panel     Component Value Date/Time   CHOL 117 09/01/2016 0926   TRIG 94 09/01/2016 0926   HDL 32 (L) 09/01/2016 0926   CHOLHDL 3.7 09/01/2016 0926   VLDL 19 09/01/2016 0926   LDLCALC 66 09/01/2016 0926     RADIOLOGY: No results found.  IMPRESSION:  No diagnosis found.  ASSESSMENT AND PLAN: Matthew. Javious Hallisey is a 70 year old white male who is 12 years status post suffering an  inferior ST segment elevation myocardial infarction and  successful stenting of his RCA with  staged cutting balloon to high grade LAD stenosis in April 2009.  A  nuclear perfusion study in 2016 was low risk and showed probable attenuation artifact versus possible left bundle branch block attenuation.  Importantly, there was no evidence for ischemia.  Ejection fraction was 45%, which  corroborates with his EF estimate on his echo Doppler study. A  preoperative echo Doppler study showed an EF of 45-50% and he had grade 1 diastolic dysfunction.  He tolerated his shoulder surgery well but several days later developed increasing shortness of breath leading to hospitalization , which most likely was related to mild volume overload.  His troponins were positive with a flat plateau.  After undergoing nuclear imaging he was referred for definitive cardiac catheterization which demonstrated  stable CAD with a widely patent RCA stent with mild nonobstructive disease.  He has experienced episodes of musculoskeletal chest wall pain particularly when he overdoes it doing weed eating.  When he was seen in November 2020 he had been doing that excessively.  Approximately 3 weeks ago he had experienced some increased shortness of breath again when he overdid it.  He denies any anginal symptoms.  At times he does note some fatigue.  His ECG today reveals sinus bradycardia with heart rate of 44.  He has chronic left bundle branch block which is stable.  I am recommending he reduce his metoprolol succinate to 12.5 mg daily.  With his CAD, I have recommended long-term DAPT and for this reason I have recommended reinstitution of clopidogrel which inadvertently was not renewed by his pharmacy.  He continues to be on rosuvastatin for hyperlipidemia.  Target LDL is less than 70.  He gets laboratory at [facility].  He continues to be followed by pulmonary for his underlying lung disease and continues to be on Anoro Ellipta.  He has not had any sequelae from his Covid infection in March/April 2020 at which time he required over a week in the hospital.  There is a remote tobacco history but fortunately he quit smoking many years ago.  I will see him in 6 months for follow-up evaluation or sooner as needed.  Troy Sine, MD, [facility]  08/25/2020 4:41 PM

## 2020-08-25 NOTE — Patient Instructions (Signed)
Medication Instructions:  Continue current medications  *If you need a refill on your cardiac medications before your next appointment, please call your pharmacy*   Lab Work: CBC, CMP, TSH, Fasting Lipids  If you have labs (blood work) drawn today and your tests are completely normal, you will receive your results only by: Marland Kitchen MyChart Message (if you have MyChart) OR . A paper copy in the mail If you have any lab test that is abnormal or we need to change your treatment, we will call you to review the results.   Testing/Procedures: None Ordered   Follow-Up: At [facility], you and your health needs are our priority.  As part of our continuing mission to provide you with exceptional heart care, we have created designated Provider Care Teams.  These Care Teams include your primary Cardiologist (physician) and Advanced Practice Providers (APPs -  Physician Assistants and Nurse Practitioners) who all work together to provide you with the care you need, when you need it.  We recommend signing up for the patient portal called "MyChart".  Sign up information is provided on this After Visit Summary.  MyChart is used to connect with patients for Virtual Visits (Telemedicine).  Patients are able to view lab/test results, encounter notes, upcoming appointments, etc.  Non-urgent messages can be sent to your provider as well.   To learn more about what you can do with MyChart, go to [email or web address].    Your next appointment:   6 month(s)  The format for your next appointment:   In Person  Provider:   You may see Nicki Guadalajara, MD or one of the following Advanced Practice Providers on your designated Care Team:    Azalee Course, PA-C  Micah Flesher, PA-C or   Judy Pimple, [place]

## 2020-08-26 ENCOUNTER — Encounter: Payer: Self-pay | Admitting: Cardiovascular Disease

## 2020-08-26 ENCOUNTER — Ambulatory Visit: Payer: BC Managed Care – PPO | Admitting: Pulmonary Disease

## 2020-08-26 NOTE — Progress Notes (Signed)
Patient ID: Matthew Evans, male   DOB: 1950-08-17, 70 y.o.   MRN: [id number]      HPI: DOCTOR SHEAHAN, is a 70 y.o. male who presents to the office today for a 6 month cardiology evaluation.   [name] has established CAD and suffered an ST segment elevation inferior MI and underwent RCA stenting in April 2009. He underwent staged cutting balloon to a high grade LAD stenosis which involved multiple branches and stenting was not done to reduce potential for jailing of his multiple branches. In August 2010 he was found to have 50-60% stenosis beyond his RCA stent in the LAD intervention site remained patent. He has a documented abdominal aortic aneurysm and a followup evaluation with Doppler imaging  on 08/29/2012 was essentially unchanged from one year previously and showed a measurement of 3.4x3.7 cm.  Additional problems include COPD, hypertension, hypothyroidism, hyperlipidemia, GERD. A nuclear study in February 2014 remained low risk and demonstrated mild apical hypokinesis with low normal wall motion without ischemia. An echo Doppler study in February 2014 showed mild LVH with grade 1 diastolic dysfunction and normal systolic function. He did have aortic valve thickening without stenosis and mild left atrial dilatation. Had normal pulmonary pressures.  He has a history of palpitations which has been treated with low dose  beta blocker therapy.He has COPD. He recently has been on Anora in addition to when necessary albuterol.  He underwent a Nissen fundoplication hiatal hernia surgery by Dr. Rosendo Gros in May, 2015.  Since that time, he has lost approximately 20 pounds.  In 2015 A follow-up abdominal aortic ultrasound his revealed 3.6 x 3.3 cm with a small amount of atherosclerosis visualized, which was not hemodynamically significant.  This was not significantly changed from one year previously.  He had an emergency room evaluation on 09/15/2014.  He had been working aggressively at home in his yard  intensely and overdid it.  He presented with presyncope and also had mild dyspnea.  He had noticed some mild dyspnea on exertion for the past several months.  In the emergency room he was noted to have an irregular heart rate but sinus rhythm with frequent PVCs.  PVCs were not conducted so his resting pulse radially was bradycardic.    On 09/30/2014 an echo Doppler showed an EF of 40-45% with diffuse hypokinesis and grade 2 diastolic dysfunction.  There was mitral annular calcification.  His left atrium was mildly dilated.  Right ventricle was normal.  A nuclear perfusion study done the same day was interpreted as low risk.  He has left bundle branch block.  There was a small mild fixed anterior defect consistent with soft tissue attenuation versus possible left bundle branch block abnormality.  There was no ischemia.  A follow-up abdominal ultrasound in June 2016 showed an aortic aneurysm of 3.7 x 3.5, which was not significantly changed.  At times, he has noticed rare episodes of chest pain but typically most of his symptoms are related to shortness of breath with activity.  He was supposed to be taking isosorbide 90 mg daily, but has only been taking 60 mg.  He now sees Dr.[name] for his pulmonary care.  [name] has been followed for primary care by Dr. Wilson Singer.  I had recently seen him prior to him undergoing shoulder surgery by Dr. Veverly Fells.which was done on 09/25/2016.  Prior to giving him  operative clearance I recommended that he undergo a preoperativean echo Doppler study. This was done on 09/01/2016 and showed  his echo EF had slightly improved and was now in the 45-50% range. He apparently tolerated his shoulder surgery well but after going home 2 days later developed increasing shortness of breath.  He presented to the hospital.  In the hospital, he again had another echo which showed an EF of 45-50%. He was referred for a nuclear stress test which raised concern for possible ischemia in the inferior  wall and probable scar in the anteroseptal wall.  As result, he underwent cardiac catheterization was done by Dr. Ellyn Hack, which revealed a patent RCA stent and 30% LAD and circumflex narrowing.  Angiographically there was no obvious culprit lesion to explain his symptoms.  When I last saw him he denied any chest pain or shortness of breath.  He was walking daily up to a mile.  He has remote history of obstructive sleep apnea but had not use CPAP in over 3 years.  He stated that his sleep was poor and was wondering if he needs to reinstitute to to treatment.    He underwent a sleep study in 01/04/2017.  No significant sleep apnea was demonstrated with an overall AHI of 1.1.  AHI during REM sleep was 1.3.  There was minimum oxygen desaturation to 88%.  He had abnormal sleep architecture with absence of slow-wave sleep and prolonged latency to rems sleep.  It was felt that he had mild increased upper airway resistance syndrome without definitive obstructive sleep apnea.  Over the past several months.  He is sleeping better.  He is now seeing Dr. Maudie Mercury for primary care.  I saw him in October 2018, at which time he remained  stable from a cardiac standpoint.    He was on Toprol-XL 37.5 mg daily, losartan 75 mg daily, isosorbide 60 mg in the morning and amlodipine 5 mg for blood pressure control.  He continued to be on dual antiplatelet therapy with aspirin and Plavix.  He also takes HCTZ 12.5 mg every other day.  He is on levothyroxine for hypothyroidism.  He takes an oral Ellipta for his COPD.   I saw him in September 2019.  At that time he had sinus bradycardia with a ventricular rate at 47 with left bundle branch block and Toprol was reduced.  [name] ultrasound in April 2019 revealed abdominal aorta measuring 3.3 cm which was not significantly changed compared to his prior examinations.  [name] in June 2019 showed a total cholesterol 122, LDL 73.  He developed COVID-19 infection on April 1 and was  hospitalized from April 5 through September 20, 2018 with significant Ona and bilateral patchy areas of airspace disease consistent with COVID-19 infection.  He was treated with Plaquenil x5 days, azithromycin x8 days and received Actemra on admission.  He felt markedly fatigued following his hospitalization.  He ultimately tested negative and apparently has a strong antibody response.  I  saw him on March 03, 2019 at which time he denied any chest pain.   At times he noted a very sharp musculoskeletal like short-lived chest pain.  He has underlying COPD and continues to have some exertional dyspnea.  He had been recently evaluated Patricia Nettle, NP pulmonary division and he sees Dr. Elsworth Soho for his primary pulmonary care.    He was seen in November 2020 after he had called the office and requested to be evaluated sooner due to his recurrent episodes of twinges in his chest.  These typically are nonexertional.  Over the last several days he had been using a  leaf blower which was strapped onto his back and he had pulldown straps over his chest and he was blowing significant amount of leaves.  He began to notice these twinges of chest pain which would occur typically while he was sitting and specifically were not exertionally precipitated.  At that time, I felt his chest pain was musculoskeletal in etiology.  I last saw him in September 2021 at which time he continued to feel well.  He experienced mild shortness of breath particularly if he over does it working on his yard and weed eating.  He had continue to be on long-term aspirin/Plavix but for some reason the pharmacy never renewed his medication and for the last 2 months he has been off clopidogrel.  He has continued to be on isosorbide 60 mg, losartan 50 mg, metoprolol succinate 25 mg and HCTZ 12.5 mg daily both for blood pressure and his CAD and was on rosuvastatin for hyperlipidemia and levothyroxine with his dose recently increased by his primary provider  to 300 mcg daily.  He denied recurrent anginal symptoms.    Since I last saw him, he has had otologic issues with recurrent rash in multiple sites including his groin, arms, back, and has undergone several dermatologic evaluations at [facility] dermatology.  He has an apparent appointment to see Dr. Hipolito Bayley at [facility] dermatology in the upcoming weeks.  He has been without anginal symptoms.  He had not been using CPAP therapy.  He believes he is sleeping well.  He is unaware of palpitations.  Past Medical History:  Diagnosis Date  . AAA (abdominal aortic aneurysm) (HCC)    mild; doppler 08/29/12-3.4x3.7  . Arthritis    "hands, back" (09/28/2016)  . CAD (coronary artery disease)    echo 07/26/12-EF 55-60%; myoview 07/26/12-low normal wall motion with mild apical hypokinesis as well a septal wall motion consistent with LBBB  . Carotid artery occlusion   . Chronic lower back pain   . Cutaneous lupus erythematosus    [name], [name]  . Dementia ([place])    "moderate; dx'd in ~ 2011" (09/28/2016)  . Dermatitis   . Emphysema   . GERD (gastroesophageal reflux disease)   . H/O hiatal hernia   . Headache(784.0)   . History of palpitations   . HTN (hypertension)   . Hyperlipidemia   . Hypothyroidism    HX OF RADIOACTIVE IODINE   . Memory loss    d/t mini stroke  . Mini stroke ([place]) ~ 2011  . Myocardial infarction ([place]) 09/2007   PCI to RCA  . Shortness of breath    WITH EXERTION  . Sleep apnea    sleep study 12/14/06 ahi 16.89, during rem 12.3, supine ahi 57.49; cpap 02/22/07-c-flex of 3 at 13cm h2o; PT STATES HE DOES NOT USE HIS CPAP EVERY NIGHT  . Sleep apnea    "aien't worn mask in 3 years" (09/28/2016)  . Stroke [facility])    PT STATES NEUROLOGIST TOLD HIM HE HAD HAD MINI STROKE  . Thyroid disease    hypothyroidism    Past Surgical History:  Procedure Laterality Date  . balloon atherotomy     LAD  . BICEPS TENDON  REPAIR Right 09/25/2016   "cleaned out spurs and arthritis" (09/28/2016)  . CARDIAC CATHETERIZATION  08/11/11   EF50-55%, small AAA, no signifiant stent restenosis in RCA , no restenosis of PTCA of LAD, medical therapy  . CARDIAC  CATHETERIZATION  09/27/06   cutting balloon arthrotomy of LAD stenosis with dilatation of a 3.5x7m cutting balloon  . CARDIAC CATHETERIZATION  01/07/09   50-60% progressive dz beyond the stented segment in the RCA , LAD intervention remained patent  . CORONARY ANGIOPLASTY WITH STENT PLACEMENT  09/24/06   PTCA/stent of RCA with a 3.5x253mTaxus DES post dilated to 3.9652m. CORONARY ANGIOPLASTY WITH STENT PLACEMENT Right 09/2007  . ESOPHAGEAL MANOMETRY N/A 08/04/2013   Procedure: ESOPHAGEAL MANOMETRY (EM);  Surgeon: PatBeryle BeamsD;  Location: [facility] ENDOSCOPY;  Service: Endoscopy;  Laterality: N/A;  . HERNIA REPAIR    . HIATAL HERNIA REPAIR  2011?  . LMarland KitchenFT HEART CATH AND CORONARY ANGIOGRAPHY N/A 10/02/2016   Procedure: Left Heart Cath and Coronary Angiography;  Surgeon: DavLeonie ManD;  Location: [facility] [place] [facility];  Service: Cardiovascular;  Laterality: N/A;  . LEFT HEART CATHETERIZATION WITH CORONARY ANGIOGRAM N/A 08/11/2011   Procedure: LEFT HEART CATHETERIZATION WITH CORONARY ANGIOGRAM;  Surgeon: ThoTroy SineD;  [facility];  Service: Cardiovascular;  Laterality: N/A;  . SHOULDER SURGERY Right     Allergies  Allergen Reactions  . Niaspan [Niacin] Rash  . Septra [Sulfamethoxazole-Trimethoprim] Rash  . Simcor [Niacin-Simvastatin Er] Rash  . Zocor [Simvastatin] Rash    Current Outpatient Medications  Medication Sig Dispense Refill  . ASPIRIN ADULT LOW STRENGTH 81 MG EC tablet TAKE 1 TABLET BY MOUTH EVERY DAY 90 tablet 2  . clopidogrel (PLAVIX) 75 MG tablet Take 1 tablet (75 mg total) by mouth daily. 90 tablet 3  . cyclobenzaprine (FLEXERIL) 10 MG tablet Take 10 mg by mouth 2 (two) times daily as needed for muscle spasms.   0  . hydrochlorothiazide  (MICROZIDE) 12.5 MG capsule TAKE 1 CAPSULE (12.5 MG TOTAL) BY MOUTH EVERY OTHER DAY. 45 capsule 3  . HYDROcodone-acetaminophen (NORCO) 7.5-325 MG tablet Take 1 tablet by mouth every 6 (six) hours as needed for moderate pain or severe pain.     . isosorbide mononitrate (IMDUR) 60 MG 24 hr tablet TAKE 1.5 TABLETS (90 MG TOTAL) BY MOUTH DAILY. 135 tablet 3  . losartan (COZAAR) 50 MG tablet TAKE 1 TABLET BY MOUTH EVERY DAY 90 tablet 2  . methocarbamol (ROBAXIN) 500 MG tablet Take 500 mg by mouth every 6 (six) hours as needed for muscle spasms.     . metoprolol succinate (TOPROL-XL) 25 MG 24 hr tablet Take 0.5 tablets (12.5 mg total) by mouth daily. 90 tablet 3  . Omega-3 Fatty Acids (FISH OIL) 1200 MG CAPS Take 1,200 mg by mouth 2 (two) times daily.     . rosuvastatin (CRESTOR) 5 MG tablet Take 5 mg by mouth daily.    . SMarland KitchenNTHROID 300 MCG tablet Take 300 mcg by mouth daily.    . uMarland Kitcheneclidinium-vilanterol (ANORO ELLIPTA) 62.5-25 MCG/INH AEPB TAKE 1 PUFF BY MOUTH EVERY DAY 60 each 0   No current facility-administered medications for this visit.    Socially he is married has 3 children. There is a remote tobacco history. He has tried to limit his sodium intake.  His wife also is my patient was diagnosed with recent breast cancer.  ROS General: Negative; No fevers, chills, or night sweats;  HEENT: Negative; No changes in vision or hearing, sinus congestion, difficulty swallowing Pulmonary: Positive for COPD, with occasional wheezing;  mild shortness of breath with uphill walking Cardiovascular: See history of present illness GI: Hiatal hernia symptoms has resolved  since his Nissen fundoplication; No  nausea, vomiting, diarrhea, or abdominal pain GU: Negative; No dysuria, hematuria, or difficulty voiding Musculoskeletal: Negative; no myalgias, joint pain, or weakness Hematologic/Oncology: Negative; no easy bruising, bleeding Endocrine: History of hypothyroidism Neuro: Negative; no changes in balance,  headaches; occasional numbness in his hands. Skin: Negative; No rashes or skin lesions Psychiatric: Negative; No behavioral problems, depression Sleep:   Positive for increased upper airway resistance;no bruxism, restless legs, hypnogognic hallucinations, no cataplexy Other comprehensive 14 point system review is negative.   PE BP (!) 142/66   Ht _0  (1.727 m)   Wt 204 lb 9.6 oz (92.8 kg)   BMI 31.11 kg/m    Repeat blood pressure by me was 124/70  Wt Readings from Last 3 Encounters:  08/25/20 204 lb 9.6 oz (92.8 kg)  02/20/20 206 lb (93.4 kg)  04/21/19 206 lb 9.6 oz (93.7 kg)   General: Alert, oriented, no distress.  Skin: Erythematous  rash at multiple spots on his arms, back, groin region; he had been on several types of medications.  HEENT: Normocephalic, atraumatic. Pupils equal round and reactive to light; sclera anicteric; extraocular muscles intact; Nose without nasal septal hypertrophy Mouth/Parynx benign; Mallinpatti scale 3 Neck: No JVD, no carotid bruits; normal carotid upstroke Lungs: clear to ausculatation and percussion; no wheezing or rales Chest wall: without tenderness to palpitation Heart: PMI not displaced, RRR, s1 s2 normal, 1/6 systolic murmur, no diastolic murmur, no rubs, gallops, thrills, or heaves Abdomen: soft, nontender; no hepatosplenomehaly, BS+; abdominal aorta nontender and not dilated by palpation. Back: no CVA tenderness Pulses 2+ Musculoskeletal: full range of motion, normal strength, no joint deformities Extremities: no clubbing cyanosis or edema, Homan's sign negative  Neurologic: grossly nonfocal; Cranial nerves grossly wnl Psychologic: Normal mood and affect   ECG (independently read by me): NSR at 60; Nonspecific intraventricular block  February 20, 2020 ECG (independently read by me): Marked Sinus Bradycardia at 44, LBBB  April 21, 2019 ECG (independently read by me): Normal sinus rhythm at 65 bpm.  Left bundle branch block.   QTc interval 453 ms  September 2020 ECG (independently read by me): Normal sinus rhythm at 61 bpm.  Left axis deviation, left bundle branch block.  September 2019 ECG (independently read by me): Sinus bradycardia 47 bpm.  Left bundle branch block with repolarization changes.  October 2018 ECG (independently read by me): Sinus bradycardia 52 bpm.  Nonspecific IVCD.  Possible lateral Q waves.  QTc interval 451 ms.  May 2018 ECG (independently read by me): Sinus bradycardia at 47 bpm.  Left bundle branch block with repolarization changes.  Nose other significant ST changes.  March 2018 ECG (independently read by me):Normal sinus rhythm at 68 bpm.  Left bundle branch block with repolarization changes.  Her interval 166 ms; QTc interval 467 ms.  December 2016 ECG (independently read by me): Sinus bradycardia 58 bpm.  Left bundle branch block.  PR interval 148 ms.    June 2016 ECG (independently read by me): Sinus bradycardia 52 bpm.  Left bundle branch block with repolarization changes.  April 2016 ECG (independently read by me): Sinus rhythm at 62 bpm.  PVC.  QTc interval 475 ms.  September 2015 ECG (independently read by me): Sinus bradycardia at 46 beats per minute.  Left bundle branch block with repolarization changes  05/13/2013 ECG: Sinus bradycardia 50 beats per minute. beats per minute with mild left axis deviation and left bundle branch block. There is complete resolution of prior ectopy.   LABS  BMP Latest  Ref Rng & Units 10/15/2018 09/19/2018 09/18/2018  Glucose 70 - 99 mg/dL 103(H) 115(H) 104(H)  BUN 8 - 23 mg/dL 14 26(H) 27(H)  Creatinine 0.61 - 1.24 mg/dL 1.18 1.13 1.29(H)  Sodium 135 - 145 mmol/L 140 134(L) 132(L)  Potassium 3.5 - 5.1 mmol/L 4.6 4.1 4.0  Chloride 98 - 111 mmol/L 107 95(L) 92(L)  CO2 22 - 32 mmol/L _0 Calcium 8.9 - 10.3 mg/dL 8.9 8.3(L) 8.2(L)     Hepatic Function Latest Ref Rng & Units 09/08/2018 09/06/2018 10/03/2016  Total Protein 6.5 - 8.1 g/dL 6.5 7.4  5.7(L)  Albumin 3.5 - 5.0 g/dL 2.9(L) 3.4(L) 2.8(L)  AST 15 - 41 U/L 92(H) 70(H) 31  ALT 0 - 44 U/L 49(H) 44 25  Alk Phosphatase 38 - 126 U/L 67 81 90  Total Bilirubin 0.3 - 1.2 mg/dL 0.8 0.8 0.5    CBC Latest Ref Rng & Units 10/15/2018 09/19/2018 09/18/2018  WBC 4.0 - 10.5 K/uL 10.5 10.1 11.6(H)  Hemoglobin 13.0 - 17.0 g/dL 14.2 13.8 14.1  Hematocrit 39.0 - 52.0 % 45.1 42.4 43.7  Platelets 150 - 400 K/uL 333 377 408(H)   Lab Results  Component Value Date   TSH <0.010 (L) 10/02/2016    BNP    Component Value Date/Time   PROBNP 20.0 11/03/2013 1737    Lipid Panel     Component Value Date/Time   CHOL 117 09/01/2016 0926   TRIG 94 09/01/2016 0926   HDL 32 (L) 09/01/2016 0926   CHOLHDL 3.7 09/01/2016 0926   VLDL 19 09/01/2016 0926   LDLCALC 66 09/01/2016 0926     RADIOLOGY: No results found.  IMPRESSION:  No diagnosis found.  ASSESSMENT AND PLAN: Mr. Idris Edmundson is a 70 year old white male who is 13 years status post suffering an  inferior ST segment elevation myocardial infarction and  successful stenting of his RCA with  staged cutting balloon to high grade LAD stenosis in April 2009.  A  nuclear perfusion study in 2016 was low risk and showed probable attenuation artifact versus possible left bundle branch block attenuation.  Importantly, there was no evidence for ischemia.  Ejection fraction was 45%, which corroborates with his EF estimate on his echo Doppler study. A  preoperative echo Doppler study showed an EF of 45-50% and he had grade 1 diastolic dysfunction.  He tolerated his shoulder surgery well but several days later developed increasing shortness of breath leading to hospitalization , which most likely was related to mild volume overload.  His troponins were positive with a flat plateau.  After undergoing nuclear imaging he was referred for definitive cardiac catheterization which demonstrated  stable CAD with a widely patent RCA stent with mild nonobstructive  disease.  He has experienced episodes of musculoskeletal chest wall pain particularly when he overdoes it doing weed eating.  Presently, he has been feeling well and denies any chest pain or palpitations.  His ECG feels normal sinus rhythm at 60 bpm with nonspecific interventricular conduction delay.  His primary care provider has retired and he will be establishing care with Dr[facility] Janie Morning at [facility].  He believes he is sleeping well.  His last sleep study in August 2018 not reveal any significant sleep apnea was suggested of mild increased upper airway resistance.  His blood pressure today is stable on hydrochlorothiazide 12.5 mg, losartan 50 mg, isosorbide's 90 mg and metoprolol succinate 12.5 mg daily.  He continues to be on omega-3 fatty acid and rosuvastatin  5 mg for hyperlipidemia with target LDL less than 70.  He is on levothyroxine 300 mcg for hypothyroidism.  He is tolerating long-term clopidogrel and low-dose aspirin.  He will be seeing Dr. Elsworth Soho for pulmonary follow-up for his lung disease and he continues to do well with Anoro Ellipta.  I see him in 6 months for reevaluation or sooner as needed.   Troy Sine, MD, [facility]  08/25/2020 4:41 PM

## 2020-09-02 DIAGNOSIS — R21 Rash and other nonspecific skin eruption: Secondary | ICD-10-CM | POA: Diagnosis not present

## 2020-09-02 DIAGNOSIS — L304 Erythema intertrigo: Secondary | ICD-10-CM | POA: Diagnosis not present

## 2020-09-02 DIAGNOSIS — L308 Other specified dermatitis: Secondary | ICD-10-CM | POA: Diagnosis not present

## 2020-09-08 ENCOUNTER — Encounter: Payer: Self-pay | Admitting: Pulmonary Disease

## 2020-09-08 ENCOUNTER — Ambulatory Visit (INDEPENDENT_AMBULATORY_CARE_PROVIDER_SITE_OTHER): Payer: BC Managed Care – PPO | Admitting: Pulmonary Disease

## 2020-09-08 ENCOUNTER — Other Ambulatory Visit: Payer: Self-pay

## 2020-09-08 DIAGNOSIS — I2510 Atherosclerotic heart disease of native coronary artery without angina pectoris: Secondary | ICD-10-CM

## 2020-09-08 DIAGNOSIS — J432 Centrilobular emphysema: Secondary | ICD-10-CM

## 2020-09-08 DIAGNOSIS — J1282 Pneumonia due to coronavirus disease 2019: Secondary | ICD-10-CM

## 2020-09-08 DIAGNOSIS — U071 COVID-19: Secondary | ICD-10-CM | POA: Diagnosis not present

## 2020-09-08 MED ORDER — TRELEGY ELLIPTA 100-62.5-25 MCG/INH IN AEPB: 1.0000 | INHALATION_SPRAY | Freq: Every day | RESPIRATORY_TRACT | 6 refills | Status: DC

## 2020-09-08 MED ORDER — TRELEGY ELLIPTA 100-62.5-25 MCG/INH IN AEPB: 1.0000 | INHALATION_SPRAY | Freq: Every day | RESPIRATORY_TRACT | 0 refills | Status: DC

## 2020-09-08 NOTE — Progress Notes (Signed)
   Subjective:    Patient ID: Matthew Evans, male    DOB: 07/31/1950, 70 y.o.   MRN: [id number]  HPI  70 yo  male with Mild COPD  He smoked 90 pack years before he quit in 2011 He has ischemic cardiomyopathy    Chief Complaint  Patient presents with  . Follow-up    Productive cough in morning with clear mucus. Taking anoro, but wanting to switch to trelegy.    He was given a sample of Trelegy in 2019 which he really liked, for some reason he is back on Anoro. He and his wife had Covid infection 09/2018, both have recovered well He developed a skin rash for unclear reason, underwent biopsy by dermatology, results pending. Overall breathing is doing well, reports cough productive of clear mucus especially in the mornings, mild dyspnea on exertion, no wheezing  Significant tests/ events reviewed  PFT's 2014: FEV1 2.04 (68%), +15% increase with BD, no restriction, normal DLCO, +airtrapping.   3/2018Spirometry shows similar lung function with FEV1 at 68%, ratio 73, FVC 69%  CTA 10/2018 Scattered areas of peripheral fibrosis and lower lobe bronchiectatic change. CT angio 09/2016 nodular scarring right upper lobe 5 mm 05/2018 CT chest without contrast 6 mm nodule along right minor fissure, unchanged  Review of Systems neg for any significant sore throat, dysphagia, itching, sneezing, nasal congestion or excess/ purulent secretions, fever, chills, sweats, unintended wt loss, pleuritic or exertional cp, hempoptysis, orthopnea pnd or change in chronic leg swelling. Also denies presyncope, palpitations, heartburn, abdominal pain, nausea, vomiting, diarrhea or change in bowel or urinary habits, dysuria,hematuria, rash, arthralgias, visual complaints, headache, numbness weakness or ataxia.     Objective:   Physical Exam  Gen. Pleasant, well-nourished, in no distress ENT - no thrush, no pallor/icterus,no post nasal drip Neck: No JVD, no thyromegaly, no carotid bruits Lungs: no use of  accessory muscles, no dullness to percussion, clear without rales or rhonchi  Cardiovascular: Rhythm regular, heart sounds  normal, no murmurs or gallops, no peripheral edema Musculoskeletal: No deformities, no cyanosis or clubbing        Assessment & Plan:

## 2020-09-08 NOTE — Patient Instructions (Signed)
Sample of trelegy 100 & Rx

## 2020-09-10 NOTE — Assessment & Plan Note (Signed)
He seems to have some postinflammatory scarring especially in the bases which is likely related to Covid infection last year. If breathing gets worse, will reassess with PFTs

## 2020-09-10 NOTE — Assessment & Plan Note (Signed)
He was provided with a sample of Trelegy and prescription will be sent in if he has no side effects. Use albuterol on an as-needed basis

## 2020-09-20 DIAGNOSIS — L2084 Intrinsic (allergic) eczema: Secondary | ICD-10-CM | POA: Diagnosis not present

## 2020-09-20 DIAGNOSIS — Z79899 Other long term (current) drug therapy: Secondary | ICD-10-CM | POA: Diagnosis not present

## 2020-09-20 DIAGNOSIS — L209 Atopic dermatitis, unspecified: Secondary | ICD-10-CM | POA: Diagnosis not present

## 2020-09-20 DIAGNOSIS — L304 Erythema intertrigo: Secondary | ICD-10-CM | POA: Diagnosis not present

## 2020-11-17 DIAGNOSIS — L539 Erythematous condition, unspecified: Secondary | ICD-10-CM | POA: Diagnosis not present

## 2020-11-17 DIAGNOSIS — L03116 Cellulitis of left lower limb: Secondary | ICD-10-CM | POA: Diagnosis not present

## 2020-11-19 DIAGNOSIS — L03116 Cellulitis of left lower limb: Secondary | ICD-10-CM | POA: Diagnosis not present

## 2020-11-19 DIAGNOSIS — S81852A Open bite, left lower leg, initial encounter: Secondary | ICD-10-CM | POA: Diagnosis not present

## 2020-11-19 DIAGNOSIS — R748 Abnormal levels of other serum enzymes: Secondary | ICD-10-CM | POA: Diagnosis not present

## 2020-12-16 ENCOUNTER — Other Ambulatory Visit: Payer: Self-pay | Admitting: Cardiovascular Disease

## 2020-12-16 ENCOUNTER — Other Ambulatory Visit: Payer: Self-pay | Admitting: Pulmonary Disease

## 2020-12-17 ENCOUNTER — Telehealth: Payer: Self-pay

## 2020-12-17 NOTE — Telephone Encounter (Signed)
Spoke to patient about what medication works best for him. Pt states "the Trelegy burned my mouth." He stated he washed his mouth out, gargled and did everything the correct way, and nothing seemed to help.  Pt states Anoro is the best option for him. I will send in Anoro into his preferred pharmacy.

## 2020-12-17 NOTE — Telephone Encounter (Signed)
Created in error

## 2020-12-17 NOTE — Telephone Encounter (Signed)
Pt is requesting Rx for Anoro , last appointment you sent in Trelegy to his pharmacy. What would you like to due?

## 2021-03-13 ENCOUNTER — Other Ambulatory Visit: Payer: Self-pay | Admitting: Cardiovascular Disease

## 2021-03-25 ENCOUNTER — Other Ambulatory Visit: Payer: Self-pay | Admitting: Cardiovascular Disease

## 2021-03-28 DIAGNOSIS — L209 Atopic dermatitis, unspecified: Secondary | ICD-10-CM | POA: Diagnosis not present

## 2021-03-28 DIAGNOSIS — L304 Erythema intertrigo: Secondary | ICD-10-CM | POA: Diagnosis not present

## 2021-03-28 DIAGNOSIS — L2084 Intrinsic (allergic) eczema: Secondary | ICD-10-CM | POA: Diagnosis not present

## 2021-03-28 DIAGNOSIS — Z79899 Other long term (current) drug therapy: Secondary | ICD-10-CM | POA: Diagnosis not present

## 2021-04-12 DIAGNOSIS — L2084 Intrinsic (allergic) eczema: Secondary | ICD-10-CM | POA: Diagnosis not present

## 2021-04-13 ENCOUNTER — Other Ambulatory Visit: Payer: Self-pay | Admitting: Cardiovascular Disease

## 2021-05-15 ENCOUNTER — Other Ambulatory Visit: Payer: Self-pay | Admitting: Cardiovascular Disease

## 2021-05-31 DIAGNOSIS — Z20822 Contact with and (suspected) exposure to covid-19: Secondary | ICD-10-CM | POA: Diagnosis not present

## 2021-06-11 ENCOUNTER — Other Ambulatory Visit: Payer: Self-pay | Admitting: Cardiovascular Disease

## 2021-07-09 ENCOUNTER — Other Ambulatory Visit: Payer: Self-pay | Admitting: Cardiovascular Disease

## 2021-08-09 ENCOUNTER — Other Ambulatory Visit: Payer: Self-pay | Admitting: Cardiovascular Disease

## 2021-09-08 ENCOUNTER — Other Ambulatory Visit: Payer: Self-pay | Admitting: Cardiovascular Disease

## 2021-09-22 DIAGNOSIS — Z20822 Contact with and (suspected) exposure to covid-19: Secondary | ICD-10-CM | POA: Diagnosis not present

## 2021-09-25 ENCOUNTER — Other Ambulatory Visit: Payer: Self-pay | Admitting: Cardiovascular Disease

## 2021-10-07 DIAGNOSIS — Z20822 Contact with and (suspected) exposure to covid-19: Secondary | ICD-10-CM | POA: Diagnosis not present

## 2021-11-15 DIAGNOSIS — Z79899 Other long term (current) drug therapy: Secondary | ICD-10-CM | POA: Diagnosis not present

## 2021-11-15 DIAGNOSIS — I2510 Atherosclerotic heart disease of native coronary artery without angina pectoris: Secondary | ICD-10-CM | POA: Diagnosis not present

## 2021-11-15 DIAGNOSIS — E039 Hypothyroidism, unspecified: Secondary | ICD-10-CM | POA: Diagnosis not present

## 2021-11-15 DIAGNOSIS — E785 Hyperlipidemia, unspecified: Secondary | ICD-10-CM | POA: Diagnosis not present

## 2021-11-15 DIAGNOSIS — R7309 Other abnormal glucose: Secondary | ICD-10-CM | POA: Diagnosis not present

## 2021-11-21 ENCOUNTER — Other Ambulatory Visit: Payer: Self-pay | Admitting: Cardiovascular Disease

## 2021-11-22 DIAGNOSIS — E785 Hyperlipidemia, unspecified: Secondary | ICD-10-CM | POA: Diagnosis not present

## 2021-11-22 DIAGNOSIS — J449 Chronic obstructive pulmonary disease, unspecified: Secondary | ICD-10-CM | POA: Diagnosis not present

## 2021-11-22 DIAGNOSIS — M47816 Spondylosis without myelopathy or radiculopathy, lumbar region: Secondary | ICD-10-CM | POA: Diagnosis not present

## 2021-11-22 DIAGNOSIS — I10 Essential (primary) hypertension: Secondary | ICD-10-CM | POA: Diagnosis not present

## 2021-11-22 DIAGNOSIS — Z955 Presence of coronary angioplasty implant and graft: Secondary | ICD-10-CM | POA: Diagnosis not present

## 2021-11-22 DIAGNOSIS — E039 Hypothyroidism, unspecified: Secondary | ICD-10-CM | POA: Diagnosis not present

## 2021-12-10 ENCOUNTER — Other Ambulatory Visit: Payer: Self-pay | Admitting: Cardiovascular Disease

## 2021-12-13 ENCOUNTER — Other Ambulatory Visit: Payer: Self-pay | Admitting: Cardiovascular Disease

## 2021-12-28 ENCOUNTER — Other Ambulatory Visit: Payer: Self-pay | Admitting: Cardiovascular Disease

## 2022-01-03 DIAGNOSIS — E785 Hyperlipidemia, unspecified: Secondary | ICD-10-CM | POA: Diagnosis not present

## 2022-03-06 DIAGNOSIS — E039 Hypothyroidism, unspecified: Secondary | ICD-10-CM | POA: Diagnosis not present

## 2022-03-06 DIAGNOSIS — Z79899 Other long term (current) drug therapy: Secondary | ICD-10-CM | POA: Diagnosis not present

## 2022-03-08 ENCOUNTER — Other Ambulatory Visit: Payer: Self-pay | Admitting: Pulmonary Disease

## 2022-03-16 ENCOUNTER — Ambulatory Visit (INDEPENDENT_AMBULATORY_CARE_PROVIDER_SITE_OTHER): Payer: BC Managed Care – PPO | Admitting: Pulmonary Disease

## 2022-03-16 ENCOUNTER — Encounter: Payer: Self-pay | Admitting: Pulmonary Disease

## 2022-03-16 ENCOUNTER — Other Ambulatory Visit: Payer: Self-pay

## 2022-03-16 DIAGNOSIS — Z23 Encounter for immunization: Secondary | ICD-10-CM

## 2022-03-16 DIAGNOSIS — J432 Centrilobular emphysema: Secondary | ICD-10-CM | POA: Diagnosis not present

## 2022-03-16 MED ORDER — ANORO ELLIPTA 62.5-25 MCG/ACT IN AEPB: 1.0000 | INHALATION_SPRAY | Freq: Every day | RESPIRATORY_TRACT | 11 refills | Status: DC

## 2022-03-16 NOTE — Assessment & Plan Note (Signed)
Continue Anoro which is subjective benefit in the past. Use albuterol for rescue

## 2022-03-16 NOTE — Progress Notes (Signed)
   Subjective:    Patient ID: Matthew Evans, male    DOB: [birth year], 71 y.o.   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 He reports dyspnea on walking up a hill.  Dermatitis is under control, he sees dermatologist. Denies cough or wheezing, no interim exacerbations. He ran out of Anoro on previous visit we tried Trelegy but he did not like this much. He feels that his wife has COPD and cancer he can walk faster than him Blood pressure is good, bradycardic, on low-dose metoprolol this has been noted prior  On ambulation-he did not desaturate heart rate increased from 47-73  Significant tests/ events reviewed  PFT's 2014: FEV1 2.04 (68%), +15% increase with BD, no restriction, normal DLCO, +airtrapping.    08/2016  Spirometry -FEV1 at 68%, ratio 73, FVC 69%   CTA 10/2018 Scattered areas of peripheral fibrosis and lower lobe bronchiectatic change.  CT angio 09/2016 nodular scarring right upper lobe 5 mm 05/2018 CT chest without contrast 6 mm nodule along right minor fissure, unchanged  Review of Systems neg for any significant sore throat, dysphagia, itching, sneezing, nasal congestion or excess/ purulent secretions, fever, chills, sweats, unintended wt loss, pleuritic or exertional cp, hempoptysis, orthopnea pnd or change in chronic leg swelling. Also denies presyncope, palpitations, heartburn, abdominal pain, nausea, vomiting, diarrhea or change in bowel or urinary habits, dysuria,hematuria, rash, arthralgias, visual complaints, headache, numbness weakness or ataxia.     Objective:   Physical Exam  Gen. 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 We will repeat high-resolution CT chest

## 2022-03-16 NOTE — Patient Instructions (Addendum)
  X Amb sat  X HRCT chest to follow up on fibrosis -[place]  X Refills on anoro x 11  X flu shot

## 2022-03-17 ENCOUNTER — Other Ambulatory Visit: Payer: Self-pay | Admitting: Cardiovascular Disease

## 2022-03-17 ENCOUNTER — Telehealth: Payer: Self-pay | Admitting: Pulmonary Disease

## 2022-03-17 NOTE — Telephone Encounter (Signed)
Called patient in attempt to inform him about his CT appt info.   CT scheduled at [facility] on 11/22 at 5pm, arrival time of 4:30pm. I left appt details on pt VM.   If patient calls back my ext. Is [phone].   Thanks

## 2022-04-08 ENCOUNTER — Other Ambulatory Visit: Payer: Self-pay | Admitting: Cardiovascular Disease

## 2022-04-26 ENCOUNTER — Ambulatory Visit (HOSPITAL_COMMUNITY): Admission: RE | Admit: 2022-04-26 | Payer: BC Managed Care – PPO | Source: Ambulatory Visit

## 2022-05-12 ENCOUNTER — Other Ambulatory Visit: Payer: Self-pay | Admitting: Cardiovascular Disease

## 2022-05-22 DIAGNOSIS — E785 Hyperlipidemia, unspecified: Secondary | ICD-10-CM | POA: Diagnosis not present

## 2022-05-22 DIAGNOSIS — I2510 Atherosclerotic heart disease of native coronary artery without angina pectoris: Secondary | ICD-10-CM | POA: Diagnosis not present

## 2022-05-22 DIAGNOSIS — Z125 Encounter for screening for malignant neoplasm of prostate: Secondary | ICD-10-CM | POA: Diagnosis not present

## 2022-05-22 DIAGNOSIS — R7309 Other abnormal glucose: Secondary | ICD-10-CM | POA: Diagnosis not present

## 2022-05-22 DIAGNOSIS — E039 Hypothyroidism, unspecified: Secondary | ICD-10-CM | POA: Diagnosis not present

## 2022-05-22 DIAGNOSIS — I10 Essential (primary) hypertension: Secondary | ICD-10-CM | POA: Diagnosis not present

## 2022-05-26 ENCOUNTER — Other Ambulatory Visit: Payer: Self-pay | Admitting: Cardiovascular Disease

## 2022-06-12 ENCOUNTER — Other Ambulatory Visit: Payer: Self-pay | Admitting: Cardiovascular Disease

## 2022-07-03 ENCOUNTER — Inpatient Hospital Stay (HOSPITAL_COMMUNITY)
Admission: EM | Admit: 2022-07-03 | Discharge: 2022-07-05 | DRG: 391 | Disposition: A | Discharge status: Home / Self Care | Payer: BC Managed Care – PPO | Attending: Family Medicine | Admitting: Family Medicine

## 2022-07-03 ENCOUNTER — Other Ambulatory Visit: Payer: Self-pay

## 2022-07-03 ENCOUNTER — Emergency Department (HOSPITAL_COMMUNITY): Payer: BC Managed Care – PPO

## 2022-07-03 DIAGNOSIS — Z1152 Encounter for screening for COVID-19: Secondary | ICD-10-CM | POA: Diagnosis not present

## 2022-07-03 DIAGNOSIS — Z6831 Body mass index (BMI) 31.0-31.9, adult: Secondary | ICD-10-CM

## 2022-07-03 DIAGNOSIS — D649 Anemia, unspecified: Secondary | ICD-10-CM | POA: Diagnosis present

## 2022-07-03 DIAGNOSIS — Z79890 Hormone replacement therapy (postmenopausal): Secondary | ICD-10-CM | POA: Diagnosis not present

## 2022-07-03 DIAGNOSIS — E032 Hypothyroidism due to medicaments and other exogenous substances: Secondary | ICD-10-CM | POA: Diagnosis not present

## 2022-07-03 DIAGNOSIS — R079 Chest pain, unspecified: Secondary | ICD-10-CM | POA: Diagnosis not present

## 2022-07-03 DIAGNOSIS — E785 Hyperlipidemia, unspecified: Secondary | ICD-10-CM | POA: Diagnosis not present

## 2022-07-03 DIAGNOSIS — J449 Chronic obstructive pulmonary disease, unspecified: Secondary | ICD-10-CM | POA: Diagnosis not present

## 2022-07-03 DIAGNOSIS — I213 ST elevation (STEMI) myocardial infarction of unspecified site: Secondary | ICD-10-CM | POA: Diagnosis not present

## 2022-07-03 DIAGNOSIS — N179 Acute kidney failure, unspecified: Secondary | ICD-10-CM | POA: Diagnosis not present

## 2022-07-03 DIAGNOSIS — I7772 Dissection of iliac artery: Secondary | ICD-10-CM | POA: Diagnosis not present

## 2022-07-03 DIAGNOSIS — Z8249 Family history of ischemic heart disease and other diseases of the circulatory system: Secondary | ICD-10-CM | POA: Diagnosis not present

## 2022-07-03 DIAGNOSIS — F03B0 Unspecified dementia, moderate, without behavioral disturbance, psychotic disturbance, mood disturbance, and anxiety: Secondary | ICD-10-CM | POA: Diagnosis present

## 2022-07-03 DIAGNOSIS — R6510 Systemic inflammatory response syndrome (SIRS) of non-infectious origin without acute organ dysfunction: Secondary | ICD-10-CM | POA: Diagnosis not present

## 2022-07-03 DIAGNOSIS — I714 Abdominal aortic aneurysm, without rupture, unspecified: Secondary | ICD-10-CM | POA: Diagnosis not present

## 2022-07-03 DIAGNOSIS — R42 Dizziness and giddiness: Secondary | ICD-10-CM | POA: Diagnosis not present

## 2022-07-03 DIAGNOSIS — J432 Centrilobular emphysema: Secondary | ICD-10-CM

## 2022-07-03 DIAGNOSIS — Z7982 Long term (current) use of aspirin: Secondary | ICD-10-CM

## 2022-07-03 DIAGNOSIS — I10 Essential (primary) hypertension: Secondary | ICD-10-CM | POA: Diagnosis present

## 2022-07-03 DIAGNOSIS — J439 Emphysema, unspecified: Secondary | ICD-10-CM | POA: Diagnosis present

## 2022-07-03 DIAGNOSIS — A419 Sepsis, unspecified organism: Secondary | ICD-10-CM

## 2022-07-03 DIAGNOSIS — Z79899 Other long term (current) drug therapy: Secondary | ICD-10-CM | POA: Diagnosis not present

## 2022-07-03 DIAGNOSIS — R531 Weakness: Secondary | ICD-10-CM | POA: Diagnosis not present

## 2022-07-03 DIAGNOSIS — I2510 Atherosclerotic heart disease of native coronary artery without angina pectoris: Secondary | ICD-10-CM | POA: Diagnosis present

## 2022-07-03 DIAGNOSIS — I959 Hypotension, unspecified: Secondary | ICD-10-CM | POA: Diagnosis not present

## 2022-07-03 DIAGNOSIS — R6520 Severe sepsis without septic shock: Secondary | ICD-10-CM | POA: Diagnosis not present

## 2022-07-03 DIAGNOSIS — I7143 Infrarenal abdominal aortic aneurysm, without rupture: Secondary | ICD-10-CM | POA: Diagnosis not present

## 2022-07-03 DIAGNOSIS — E669 Obesity, unspecified: Secondary | ICD-10-CM | POA: Diagnosis present

## 2022-07-03 DIAGNOSIS — E039 Hypothyroidism, unspecified: Secondary | ICD-10-CM | POA: Diagnosis not present

## 2022-07-03 DIAGNOSIS — I2489 Other forms of acute ischemic heart disease: Secondary | ICD-10-CM | POA: Diagnosis present

## 2022-07-03 DIAGNOSIS — K219 Gastro-esophageal reflux disease without esophagitis: Secondary | ICD-10-CM | POA: Diagnosis present

## 2022-07-03 DIAGNOSIS — Z825 Family history of asthma and other chronic lower respiratory diseases: Secondary | ICD-10-CM

## 2022-07-03 DIAGNOSIS — R197 Diarrhea, unspecified: Secondary | ICD-10-CM | POA: Diagnosis not present

## 2022-07-03 DIAGNOSIS — K5790 Diverticulosis of intestine, part unspecified, without perforation or abscess without bleeding: Secondary | ICD-10-CM | POA: Diagnosis present

## 2022-07-03 DIAGNOSIS — R0602 Shortness of breath: Secondary | ICD-10-CM | POA: Diagnosis not present

## 2022-07-03 DIAGNOSIS — I2542 Coronary artery dissection: Secondary | ICD-10-CM | POA: Diagnosis not present

## 2022-07-03 DIAGNOSIS — Z87891 Personal history of nicotine dependence: Secondary | ICD-10-CM

## 2022-07-03 DIAGNOSIS — K5732 Diverticulitis of large intestine without perforation or abscess without bleeding: Secondary | ICD-10-CM | POA: Diagnosis not present

## 2022-07-03 DIAGNOSIS — I252 Old myocardial infarction: Secondary | ICD-10-CM

## 2022-07-03 DIAGNOSIS — R9431 Abnormal electrocardiogram [ECG] [EKG]: Secondary | ICD-10-CM | POA: Diagnosis not present

## 2022-07-03 DIAGNOSIS — Z7951 Long term (current) use of inhaled steroids: Secondary | ICD-10-CM

## 2022-07-03 DIAGNOSIS — R0689 Other abnormalities of breathing: Secondary | ICD-10-CM | POA: Diagnosis not present

## 2022-07-03 DIAGNOSIS — Z955 Presence of coronary angioplasty implant and graft: Secondary | ICD-10-CM

## 2022-07-03 DIAGNOSIS — Z7902 Long term (current) use of antithrombotics/antiplatelets: Secondary | ICD-10-CM

## 2022-07-03 DIAGNOSIS — Z888 Allergy status to other drugs, medicaments and biological substances status: Secondary | ICD-10-CM

## 2022-07-03 DIAGNOSIS — R252 Cramp and spasm: Secondary | ICD-10-CM | POA: Diagnosis not present

## 2022-07-03 DIAGNOSIS — Z8673 Personal history of transient ischemic attack (TIA), and cerebral infarction without residual deficits: Secondary | ICD-10-CM

## 2022-07-03 LAB — COMPREHENSIVE METABOLIC PANEL
ALT: 16 U/L (ref 0–44)
AST: 39 U/L (ref 15–41)
Albumin: 3.2 g/dL — ABNORMAL LOW (ref 3.5–5.0)
Alkaline Phosphatase: 69 U/L (ref 38–126)
Anion gap: 11 (ref 5–15)
BUN: 19 mg/dL (ref 8–23)
CO2: 25 mmol/L (ref 22–32)
Calcium: 8.1 mg/dL — ABNORMAL LOW (ref 8.9–10.3)
Chloride: 103 mmol/L (ref 98–111)
Creatinine, Ser: 1.49 mg/dL — ABNORMAL HIGH (ref 0.61–1.24)
GFR, Estimated: 50 mL/min — ABNORMAL LOW (ref >60)
Glucose, Bld: 98 mg/dL (ref 70–99)
Potassium: 4 mmol/L (ref 3.5–5.1)
Sodium: 139 mmol/L (ref 135–145)
Total Bilirubin: 0.9 mg/dL (ref 0.3–1.2)
Total Protein: 5.8 g/dL — ABNORMAL LOW (ref 6.5–8.1)

## 2022-07-03 LAB — RESPIRATORY PANEL BY PCR

## 2022-07-03 LAB — CBC WITH DIFFERENTIAL/PLATELET
Abs Immature Granulocytes: 0.03 10*3/uL (ref 0.00–0.07)
Basophils Absolute: 0 10*3/uL (ref 0.0–0.1)
Basophils Relative: 0 %
Eosinophils Absolute: 0 10*3/uL (ref 0.0–0.5)
Eosinophils Relative: 0 %
HCT: 39.5 % (ref 39.0–52.0)
Hemoglobin: 12.8 g/dL — ABNORMAL LOW (ref 13.0–17.0)
Immature Granulocytes: 0 %
Lymphocytes Relative: 3 %
Lymphs Abs: 0.4 10*3/uL — ABNORMAL LOW (ref 0.7–4.0)
MCH: 29.4 pg (ref 26.0–34.0)
MCHC: 32.4 g/dL (ref 30.0–36.0)
MCV: 90.8 fL (ref 80.0–100.0)
Monocytes Absolute: 0.5 10*3/uL (ref 0.1–1.0)
Monocytes Relative: 4 %
Neutro Abs: 12.2 10*3/uL — ABNORMAL HIGH (ref 1.7–7.7)
Neutrophils Relative %: 93 %
Platelets: 142 10*3/uL — ABNORMAL LOW (ref 150–400)
RBC: 4.35 MIL/uL (ref 4.22–5.81)
RDW: 14.4 % (ref 11.5–15.5)
WBC: 13.2 10*3/uL — ABNORMAL HIGH (ref 4.0–10.5)
nRBC: 0 % (ref 0.0–0.2)

## 2022-07-03 LAB — RESP PANEL BY RT-PCR (RSV, FLU A&B, COVID)  RVPGX2
Influenza A by PCR: NEGATIVE
Influenza B by PCR: NEGATIVE
Resp Syncytial Virus by PCR: NEGATIVE
SARS Coronavirus 2 by RT PCR: NEGATIVE

## 2022-07-03 LAB — URINALYSIS, W/ REFLEX TO CULTURE (INFECTION SUSPECTED)
Bacteria, UA: NONE SEEN
Bilirubin Urine: NEGATIVE
Glucose, UA: NEGATIVE mg/dL
Hgb urine dipstick: NEGATIVE
Ketones, ur: NEGATIVE mg/dL
Leukocytes,Ua: NEGATIVE
Nitrite: NEGATIVE
Protein, ur: NEGATIVE mg/dL
Specific Gravity, Urine: >1.046 — ABNORMAL HIGH (ref 1.005–1.030)
pH: 6 (ref 5.0–8.0)

## 2022-07-03 LAB — I-STAT VENOUS BLOOD GAS, ED
Acid-base deficit: 1 mmol/L (ref 0.0–2.0)
Bicarbonate: 25.1 mmol/L (ref 20.0–28.0)
Calcium, Ion: 1.08 mmol/L — ABNORMAL LOW (ref 1.15–1.40)
HCT: 41 % (ref 39.0–52.0)
Hemoglobin: 13.9 g/dL (ref 13.0–17.0)
O2 Saturation: 83 %
Potassium: 3.9 mmol/L (ref 3.5–5.1)
Sodium: 141 mmol/L (ref 135–145)
TCO2: 26 mmol/L (ref 22–32)
pCO2, Ven: 43.8 mmHg — ABNORMAL LOW (ref 44–60)
pH, Ven: 7.366 (ref 7.25–7.43)
pO2, Ven: 49 mmHg — ABNORMAL HIGH (ref 32–45)

## 2022-07-03 LAB — TROPONIN I (HIGH SENSITIVITY)
Troponin I (High Sensitivity): 9 ng/L (ref <18)
Troponin I (High Sensitivity): 20 ng/L — ABNORMAL HIGH (ref <18)

## 2022-07-03 LAB — CK: Total CK: 526 U/L — ABNORMAL HIGH (ref 49–397)

## 2022-07-03 LAB — BRAIN NATRIURETIC PEPTIDE: B Natriuretic Peptide: 25.1 pg/mL (ref 0.0–100.0)

## 2022-07-03 LAB — APTT: aPTT: 27 seconds (ref 24–36)

## 2022-07-03 LAB — TSH: TSH: 19.162 u[IU]/mL — ABNORMAL HIGH (ref 0.350–4.500)

## 2022-07-03 LAB — LACTIC ACID, PLASMA
Lactic Acid, Venous: 2.3 mmol/L (ref 0.5–1.9)
Lactic Acid, Venous: 1.4 mmol/L (ref 0.5–1.9)

## 2022-07-03 LAB — MRSA NEXT GEN BY PCR, NASAL: MRSA by PCR Next Gen: NOT DETECTED

## 2022-07-03 LAB — PROTIME-INR
INR: 1 (ref 0.8–1.2)
Prothrombin Time: 13.1 seconds (ref 11.4–15.2)

## 2022-07-03 LAB — PROCALCITONIN: Procalcitonin: 0.39 ng/mL

## 2022-07-03 LAB — MAGNESIUM: Magnesium: 1.5 mg/dL — ABNORMAL LOW (ref 1.7–2.4)

## 2022-07-03 MED ORDER — SODIUM CHLORIDE 0.9 % IV BOLUS (SEPSIS): 1000.0000 mL | Freq: Once | INTRAVENOUS | Status: DC

## 2022-07-03 MED ORDER — SODIUM CHLORIDE 0.9 % IV BOLUS
500.0000 mL | Freq: Once | INTRAVENOUS | Status: AC
  Administered 2022-07-03: 500 mL via INTRAVENOUS

## 2022-07-03 MED ORDER — ASPIRIN 81 MG PO TBEC
81.0000 mg | DELAYED_RELEASE_TABLET | Freq: Every day | ORAL | Status: DC
  Administered 2022-07-03 – 2022-07-05 (×3): 81 mg via ORAL
  Filled 2022-07-03 (×3): qty 1

## 2022-07-03 MED ORDER — HYDROCODONE-ACETAMINOPHEN 7.5-325 MG PO TABS
1.0000 | ORAL_TABLET | Freq: Four times a day (QID) | ORAL | Status: DC | PRN
  Administered 2022-07-04 – 2022-07-05 (×4): 1 via ORAL
  Filled 2022-07-03 (×4): qty 1

## 2022-07-03 MED ORDER — IOHEXOL 350 MG/ML SOLN
75.0000 mL | Freq: Once | INTRAVENOUS | Status: AC | PRN
  Administered 2022-07-03: 75 mL via INTRAVENOUS

## 2022-07-03 MED ORDER — SODIUM CHLORIDE 0.9 % IV SOLN: INTRAVENOUS | Status: DC

## 2022-07-03 MED ORDER — UMECLIDINIUM-VILANTEROL 62.5-25 MCG/ACT IN AEPB
1.0000 | INHALATION_SPRAY | Freq: Every day | RESPIRATORY_TRACT | Status: DC
  Administered 2022-07-04 – 2022-07-05 (×2): 1 via RESPIRATORY_TRACT
  Filled 2022-07-03: qty 14

## 2022-07-03 MED ORDER — VANCOMYCIN HCL IN DEXTROSE 1-5 GM/200ML-% IV SOLN: 1000.0000 mg | Freq: Once | INTRAVENOUS | Status: DC

## 2022-07-03 MED ORDER — LACTATED RINGERS IV SOLN: INTRAVENOUS | Status: DC

## 2022-07-03 MED ORDER — CALCIUM GLUCONATE-NACL 2-0.675 GM/100ML-% IV SOLN
2.0000 g | Freq: Once | INTRAVENOUS | Status: AC
  Administered 2022-07-03: 2000 mg via INTRAVENOUS
  Filled 2022-07-03 (×2): qty 100

## 2022-07-03 MED ORDER — ACETAMINOPHEN 325 MG PO TABS: 650.0000 mg | ORAL_TABLET | Freq: Four times a day (QID) | ORAL | Status: DC | PRN

## 2022-07-03 MED ORDER — VANCOMYCIN HCL IN DEXTROSE 1-5 GM/200ML-% IV SOLN
1000.0000 mg | INTRAVENOUS | Status: DC
  Administered 2022-07-04 – 2022-07-05 (×2): 1000 mg via INTRAVENOUS
  Filled 2022-07-03 (×2): qty 200

## 2022-07-03 MED ORDER — MAGNESIUM SULFATE 2 GM/50ML IV SOLN
2.0000 g | Freq: Once | INTRAVENOUS | Status: AC
  Administered 2022-07-03: 2 g via INTRAVENOUS
  Filled 2022-07-03: qty 50

## 2022-07-03 MED ORDER — VANCOMYCIN HCL IN DEXTROSE 1-5 GM/200ML-% IV SOLN
1000.0000 mg | Freq: Once | INTRAVENOUS | Status: AC
  Administered 2022-07-03: 1000 mg via INTRAVENOUS
  Filled 2022-07-03: qty 200

## 2022-07-03 MED ORDER — VANCOMYCIN HCL IN DEXTROSE 1-5 GM/200ML-% IV SOLN
1000.0000 mg | Freq: Once | INTRAVENOUS | Status: AC
  Administered 2022-07-03: 1000 mg via INTRAVENOUS

## 2022-07-03 MED ORDER — VANCOMYCIN HCL IN DEXTROSE 1-5 GM/200ML-% IV SOLN
1000.0000 mg | Freq: Once | INTRAVENOUS | Status: DC
  Filled 2022-07-03: qty 200

## 2022-07-03 MED ORDER — ACETAMINOPHEN 650 MG RE SUPP: 650.0000 mg | Freq: Four times a day (QID) | RECTAL | Status: DC | PRN

## 2022-07-03 MED ORDER — SODIUM CHLORIDE 0.9 % IV SOLN
2.0000 g | Freq: Two times a day (BID) | INTRAVENOUS | Status: DC
  Administered 2022-07-03: 2 g via INTRAVENOUS
  Filled 2022-07-03: qty 12.5

## 2022-07-03 MED ORDER — SODIUM CHLORIDE 0.9 % IV BOLUS (SEPSIS)
1000.0000 mL | Freq: Once | INTRAVENOUS | Status: AC
  Administered 2022-07-03: 1000 mL via INTRAVENOUS

## 2022-07-03 MED ORDER — LEVOTHYROXINE SODIUM 100 MCG PO TABS
300.0000 ug | ORAL_TABLET | ORAL | Status: DC
  Administered 2022-07-03: 300 ug via ORAL
  Filled 2022-07-03: qty 3

## 2022-07-03 MED ORDER — ENOXAPARIN SODIUM 40 MG/0.4ML IJ SOSY
40.0000 mg | PREFILLED_SYRINGE | Freq: Every day | INTRAMUSCULAR | Status: DC
  Administered 2022-07-03 – 2022-07-05 (×3): 40 mg via SUBCUTANEOUS
  Filled 2022-07-03 (×3): qty 0.4

## 2022-07-03 MED ORDER — SODIUM CHLORIDE 0.9 % IV SOLN
2.0000 g | Freq: Once | INTRAVENOUS | Status: AC
  Administered 2022-07-03: 2 g via INTRAVENOUS
  Filled 2022-07-03: qty 12.5

## 2022-07-03 MED ORDER — ALBUTEROL SULFATE (2.5 MG/3ML) 0.083% IN NEBU: 2.5000 mg | INHALATION_SOLUTION | Freq: Four times a day (QID) | RESPIRATORY_TRACT | Status: DC | PRN

## 2022-07-03 MED ORDER — CLOPIDOGREL BISULFATE 75 MG PO TABS
75.0000 mg | ORAL_TABLET | Freq: Every day | ORAL | Status: DC
  Administered 2022-07-03 – 2022-07-05 (×3): 75 mg via ORAL
  Filled 2022-07-03 (×3): qty 1

## 2022-07-03 MED ORDER — SODIUM CHLORIDE 0.9 % IV BOLUS (SEPSIS): 500.0000 mL | Freq: Once | INTRAVENOUS | Status: DC

## 2022-07-03 MED ORDER — ROSUVASTATIN CALCIUM 5 MG PO TABS
5.0000 mg | ORAL_TABLET | Freq: Every day | ORAL | Status: DC
  Administered 2022-07-03 – 2022-07-05 (×3): 5 mg via ORAL
  Filled 2022-07-03 (×3): qty 1

## 2022-07-03 MED ORDER — SODIUM CHLORIDE 0.9% FLUSH
3.0000 mL | Freq: Two times a day (BID) | INTRAVENOUS | Status: DC
  Administered 2022-07-03 – 2022-07-05 (×4): 3 mL via INTRAVENOUS

## 2022-07-03 NOTE — ED Triage Notes (Signed)
Pt BIB [facility] and SOB since midnight and hypotension. BP 84/50 on scene.  1L NS given en route.  BP on arrival 108/76.  HR 90-100. 95% A.  Pt placed on 2L or comorts.   Pt has hx of NSTEMI.  324 ASA.  ECG normal.

## 2022-07-03 NOTE — Sepsis Progress Note (Signed)
Notified bedside nurse of need to draw repeat lactic acid. 

## 2022-07-03 NOTE — Sepsis Progress Note (Signed)
Sepsis protocol monitored by eLink 

## 2022-07-03 NOTE — ED Provider Notes (Signed)
[place] EMERGENCY DEPARTMENT AT [facility] Provider Note   CSN: [id number] Arrival date & time: 07/03/22  8841     History  Chief Complaint  Patient presents with   Code Sepsis    Matthew Evans is a 72 y.o. male. With pmh COPD/emphysema, CAD, AAA, CF presents from home after feeling generally unwell with episode of uncontrollable shakes found hypotensive 80/40 by EMS with shortness of breath and nausea and given 1L IVF bolus and 324 ASA now feeling generally improved.  Patient said he woke up around 1:30 AM feeling generally unwell.  He was having uncontrollable shakes and shivers and felt very cold.  He did not think he had a fever.  However this lasted at least an hour.  He was feeling nauseous and short of breath.  His wife called EMS when they arrived they found his blood pressure to be 80/40.  He was not hypoxic.  They did administer 1 L of fluids and 324 mg of aspirin with improvement.  Patient notes history of heart attack and this feeling similar however he denies any chest pain but also had no chest pain with his heart attack.  He is denying any active chest pain, abdominal pain, shortness of breath, nausea, vomiting, diarrhea.  He does feel chest congestion but has not had necessarily new cough or other symptoms.  Of note his wife recently was positive for RSV about 2 weeks ago.  HPI     Home Medications Prior to Admission medications   Medication Sig Start Date End Date Taking? Authorizing Provider  aspirin EC (ASPIRIN LOW DOSE) 81 MG tablet TAKE 1 TABLET (81 MG TOTAL) BY MOUTH DAILY. PATIENT NEEDS APPOINTMENT FOR ANY FUTURE REFILLS. 12/14/21   Lennette Bihari, MD  clopidogrel (PLAVIX) 75 MG tablet Take 1 tablet (75 mg total) by mouth daily. Schedule an appointment for further refills 2nd attempt 05/26/22   Lennette Bihari, MD  Fluticasone-Umeclidin-Vilant (TRELEGY ELLIPTA) 100-62.5-25 MCG/INH AEPB Inhale 1 puff into the lungs daily. 09/08/20   Oretha Milch, MD   hydrochlorothiazide (MICROZIDE) 12.5 MG capsule TAKE 1 CAPSULE BY MOUTH EVERY OTHER DAY. CONTACT THE OFFICE TO SCHEDULE APPOINTMENT FOR REFILLS 04/10/22   Lennette Bihari, MD  HYDROcodone-acetaminophen (NORCO) 7.5-325 MG tablet Take 1 tablet by mouth every 6 (six) hours as needed for moderate pain or severe pain.  08/31/18   [provider]  isosorbide mononitrate (IMDUR) 60 MG 24 hr tablet TAKE 1 AND 1/2 TABLET (90 MG TOTAL) BY MOUTH DAILY. 06/12/22   Lennette Bihari, MD  losartan (COZAAR) 50 MG tablet TAKE 1 TABLET BY MOUTH EVERY DAY 12/12/21   Lennette Bihari, MD  metoprolol succinate (TOPROL-XL) 25 MG 24 hr tablet TAKE 1/2 TABLET BY MOUTH EVERY DAY 09/26/21   Lennette Bihari, MD  Omega-3 Fatty Acids (FISH OIL) 1200 MG CAPS Take 1,200 mg by mouth 2 (two) times daily.     [provider]  rosuvastatin (CRESTOR) 5 MG tablet Take 5 mg by mouth daily. 02/10/20   [provider]  SYNTHROID 300 MCG tablet Take 300 mcg by mouth daily. 12/15/19   [provider]  umeclidinium-vilanterol (ANORO ELLIPTA) 62.5-25 MCG/ACT AEPB Inhale 1 puff into the lungs daily. 03/16/22   Oretha Milch, MD  umeclidinium-vilanterol (ANORO ELLIPTA) 62.5-25 MCG/INH AEPB Inhale 1 puff into the lungs daily. INHALE 1 PUFF BY MOUTH EVERY DAY 12/17/20   Oretha Milch, MD      Allergies  Niaspan [niacin], Septra [sulfamethoxazole-trimethoprim], Simcor [niacin-simvastatin er], and Zocor [simvastatin]    Review of Systems   Review of Systems  Physical Exam Updated Vital Signs BP (!) 118/57   Pulse 71   Temp 98 F (36.7 C) (Oral)   Resp (!) 24   Ht 5\' 8"  (1.727 m)   Wt 92.5 kg   SpO2 95%   BMI 31.02 kg/m  Physical Exam Constitutional: Alert and oriented. NAD, talking in full sentences and pleasant Eyes: Conjunctivae are normal. ENT      Head: Normocephalic and atraumatic.      Nose: No congestion.      Mouth/Throat: Mucous membranes are moist.      Neck: No stridor. Cardiovascular:  S1, S2,  regular rate, Normal and symmetric distal pulses are present in all extremities.Warm and well perfused. Respiratory: Normal respiratory effort. Breath sounds are normal. O2 sat 94 on RA Gastrointestinal: Soft and mildly distended, but nontender Musculoskeletal: Normal range of motion in all extremities. No pitting edema Neurologic: Normal speech and language. No facial droop. Moving extremities x 4. Sensation grossly intact, No gross focal neurologic deficits are appreciated. Skin: Skin is warm, dry and intact. No rash noted. Psychiatric: Mood and affect are normal. Speech and behavior are normal.  ED Results / Procedures / Treatments   Labs (all labs ordered are listed, but only abnormal results are displayed) Labs Reviewed  CK - Abnormal; Notable for the following components:      Result Value   Total CK 526 (*)    All other components within normal limits  MAGNESIUM - Abnormal; Notable for the following components:   Magnesium 1.5 (*)    All other components within normal limits  LACTIC ACID, PLASMA - Abnormal; Notable for the following components:   Lactic Acid, Venous 2.3 (*)    All other components within normal limits  COMPREHENSIVE METABOLIC PANEL - Abnormal; Notable for the following components:   Creatinine, Ser 1.49 (*)    Calcium 8.1 (*)    Total Protein 5.8 (*)    Albumin 3.2 (*)    GFR, Estimated 50 (*)    All other components within normal limits  CBC WITH DIFFERENTIAL/PLATELET - Abnormal; Notable for the following components:   WBC 13.2 (*)    Hemoglobin 12.8 (*)    Platelets 142 (*)    Neutro Abs 12.2 (*)    Lymphs Abs 0.4 (*)    All other components within normal limits  I-STAT VENOUS BLOOD GAS, ED - Abnormal; Notable for the following components:   pCO2, Ven 43.8 (*)    pO2, Ven 49 (*)    Calcium, Ion 1.08 (*)    All other components within normal limits  RESP PANEL BY RT-PCR (RSV, FLU A&B, COVID)  RVPGX2  CULTURE, BLOOD (ROUTINE X 2)  CULTURE,  BLOOD (ROUTINE X 2)  MRSA NEXT GEN BY PCR, NASAL  RESPIRATORY PANEL BY PCR  PROTIME-INR  APTT  BRAIN NATRIURETIC PEPTIDE  LACTIC ACID, PLASMA  URINALYSIS, W/ REFLEX TO CULTURE (INFECTION SUSPECTED)  TSH  PROCALCITONIN  TROPONIN I (HIGH SENSITIVITY)    EKG EKG Interpretation  Date/Time:  Monday July 03 2022 07:05:19 EST Ventricular Rate:  85 PR Interval:  190 QRS Duration: 148 QT Interval:  405 QTC Calculation: 482 R Axis:   -77 Text Interpretation: Sinus rhythm Ventricular bigeminy Nonspecific IVCD with LAD Left ventricular hypertrophy Anterior infarct, old Confirmed by Georgina Snell [phone]) on 07/03/2022 7:11:15 AM  Radiology CT Angio Chest/Abd/Pel for Dissection  W and/or Wo Contrast  Result Date: 07/03/2022 CLINICAL DATA:  Hypotension and shortness of breath. History of abdominal aortic aneurysm. Evaluate for rupture. EXAM: CT ANGIOGRAPHY CHEST, ABDOMEN AND PELVIS TECHNIQUE: Non-contrast CT of the chest was initially obtained. Multidetector CT imaging through the chest, abdomen and pelvis was performed using the standard protocol during bolus administration of intravenous contrast. Multiplanar reconstructed images and MIPs were obtained and reviewed to evaluate the vascular anatomy. RADIATION DOSE REDUCTION: This exam was performed according to the departmental dose-optimization program which includes automated exposure control, adjustment of the mA and/or kV according to patient size and/or use of iterative reconstruction technique. CONTRAST:  78mL OMNIPAQUE IOHEXOL 350 MG/ML SOLN COMPARISON:  Chest CTA 10/15/2018 FINDINGS: CTA CHEST FINDINGS Cardiovascular: Atherosclerosis of the aorta, great vessels and coronary arteries. No evidence of aortic aneurysm or dissection. No evidence of acute pulmonary embolism. Stable mild cardiomegaly. No significant pericardial effusion. Mediastinum/Nodes: There are no enlarged mediastinal, hilar or axillary lymph nodes. The thyroid gland,  trachea and esophagus demonstrate no significant findings. Lungs/Pleura: No pleural effusion or pneumothorax. Stable peripheral scarring in the right lung adjacent to chronic rib fractures. Patchy ground-glass opacities in both lungs are similar to previous study and may be due to scarring or atelectasis. No confluent airspace disease. Musculoskeletal/Chest wall: No chest wall mass or suspicious osseous findings. Multiple old right-sided rib fractures. Review of the MIP images confirms the above findings. CTA ABDOMEN AND PELVIS FINDINGS VASCULAR Aorta: Diffuse aortic atherosclerosis with an infrarenal abdominal aortic aneurysm measuring up to 3.6 x 3.5 cm on image 201/7. Associated irregular mural thrombus. No aortic dissection or rupture identified. Celiac: Atherosclerosis. Patent without evidence of aneurysm, dissection or significant stenosis. SMA: Atherosclerosis. Patent without evidence of aneurysm, dissection or significant stenosis. Renals: Both renal arteries are patent without evidence of aneurysm, dissection or significant stenosis. Mild atherosclerosis. Small accessory left renal artery. IMA: Patent without evidence of aneurysm, dissection, vasculitis or significant stenosis. Inflow: Possible short-segment dissection within the right common iliac artery which measures up to 1.9 cm in diameter. Veins: No obvious venous abnormality within the limitations of this arterial phase study. Review of the MIP images confirms the above findings. NON-VASCULAR FINDINGS Hepatobiliary: Mild contour irregularity of the liver, suggesting possible early cirrhosis. No focal lesions are identified. No evidence of gallstones, gallbladder wall thickening or biliary dilatation. Pancreas: Unremarkable. No pancreatic ductal dilatation or surrounding inflammatory changes. Spleen: Normal in size without focal abnormality. Adrenals/Urinary Tract: Both adrenal glands appear normal. The kidneys appear normal without evidence of  urinary tract calculus, suspicious lesion or hydronephrosis. The bladder appears normal for its degree of distention. Stomach/Bowel: No enteric contrast administered. The stomach appears unremarkable for its degree of distension. No evidence of bowel wall thickening, distention or surrounding inflammatory change. The cecum is located anteriorly in the right mid abdomen. The appendix is not clearly visualized, although there is no pericecal inflammation to suggest appendicitis. Mild distal colonic diverticulosis noted. Lymphatic: There are no enlarged abdominal or pelvic lymph nodes. Reproductive: The prostate gland and seminal vesicles appear unremarkable. Other: Mildly prominent fat in both inguinal canals. No ascites, free air or focal extraluminal fluid collection. Musculoskeletal: No acute or significant osseous findings. Lower lumbar spondylosis with degenerative disc disease most advanced at L5-S1. Review of the MIP images confirms the above findings. IMPRESSION: 1. No acute vascular findings identified. 2. Infrarenal abdominal aortic aneurysm measuring up to 3.6 cm in diameter. Recommend follow-up ultrasound every 2 years. This recommendation follows ACR consensus guidelines: White Paper  of the ACR Incidental Findings Committee II on Vascular Findings. J Am Coll Radiol 2013; 10:789-794. 3. Possible short-segment dissection within the right common iliac artery. 4. No evidence of acute pulmonary embolism or other acute chest findings. 5. Stable chronic right-sided rib fractures with associated scarring in the right lung. Patchy ground-glass opacities in both lungs are similar to previous study and may be due to scarring or atelectasis. 6. No acute findings in the abdomen or pelvis. Electronically Signed   By: Carey Bullocks M.D.   On: 07/03/2022 10:15   DG Chest 2 View  Result Date: 07/03/2022 CLINICAL DATA:  Sepsis.  Weakness.  Dizziness. EXAM: CHEST - 2 VIEW COMPARISON:  11/29/2018 FINDINGS: Heart size  is normal. Mediastinal shadows are normal. Chronic scarring on the right subsequent to old healed rib fractures. No sign of acute infiltrate, edema, collapse or effusion. IMPRESSION: No active disease. Chronic scarring on the right subsequent to old healed rib fractures. Electronically Signed   By: Paulina Fusi M.D.   On: 07/03/2022 08:03    Procedures .Critical Care  Performed by: Mardene Sayer, MD Authorized by: Mardene Sayer, MD   Critical care provider statement:    Critical care time (minutes):  45   Critical care was necessary to treat or prevent imminent or life-threatening deterioration of the following conditions:  Sepsis and shock   Critical care was time spent personally by me on the following activities:  Development of treatment plan with patient or surrogate, discussions with consultants, evaluation of patient's response to treatment, examination of patient, ordering and review of laboratory studies, ordering and review of radiographic studies, ordering and performing treatments and interventions, pulse oximetry, re-evaluation of patient's condition, review of old charts and obtaining history from patient or surrogate   Care discussed with: admitting provider       Medications Ordered in ED Medications  vancomycin (VANCOCIN) IVPB 1000 mg/200 mL premix (1,000 mg Intravenous New Bag/Given 07/03/22 1103)    Followed by  vancomycin (VANCOCIN) IVPB 1000 mg/200 mL premix (1,000 mg Intravenous New Bag/Given 07/03/22 0839)  magnesium sulfate IVPB 2 g 50 mL (2 g Intravenous New Bag/Given 07/03/22 1101)  sodium chloride 0.9 % bolus 1,000 mL (1,000 mLs Intravenous New Bag/Given 07/03/22 1055)  ceFEPIme (MAXIPIME) 2 g in sodium chloride 0.9 % 100 mL IVPB (has no administration in time range)  vancomycin (VANCOCIN) IVPB 1000 mg/200 mL premix (has no administration in time range)  enoxaparin (LOVENOX) injection 40 mg (has no administration in time range)  sodium chloride flush (NS)  0.9 % injection 3 mL (has no administration in time range)  acetaminophen (TYLENOL) tablet 650 mg (has no administration in time range)    Or  acetaminophen (TYLENOL) suppository 650 mg (has no administration in time range)  0.9 %  sodium chloride infusion (has no administration in time range)  albuterol (PROVENTIL) (2.5 MG/3ML) 0.083% nebulizer solution 2.5 mg (has no administration in time range)  ceFEPIme (MAXIPIME) 2 g in sodium chloride 0.9 % 100 mL IVPB (0 g Intravenous Stopped 07/03/22 1047)  sodium chloride 0.9 % bolus 1,000 mL (0 mLs Intravenous Stopped 07/03/22 1048)  iohexol (OMNIPAQUE) 350 MG/ML injection 75 mL (75 mLs Intravenous Contrast Given 07/03/22 0951)    ED Course/ Medical Decision Making/ A&P Clinical Course as of 07/03/22 1125  Mon Jul 03, 2022  0920 Labs reviewed, leukocytosis 13.2 with left shift , lactate 2.3, concerning for sepsis as suspected.  Platelet count 142, hemoglobin 12.8 slightly down trended  from baseline but normal PT PTT no concern for DIC.  Creatinine 1.49 slightly elevated from baseline mild AKI.  No transaminitis.  pH 7.36 with mild respiratory alkalosis pCO2 43.  Chest x-ray personally reviewed by me, read by radiology as no evidence of pneumonia, on my personal read suspected some mild hilar infiltrate.  Hypomagnesemia 5 ordered 2 g IV magnesium sulfate.  Reassessed patient, blood pressure remaining with MAP greater than 65 currently 100/60 no acute distress not currently on oxygen. [VB]  1043 CTA dissection resulted no acute pathology, questionable right common iliac artery dissection.  Spoke with on-call vascular surgeon who reviewed patient's imaging and said nothing to do, appears chronic in nature.  Will arrange for outpatient follow-up regarding AAA. [VB]  1125 Spoke with Dr. Katrinka Blazing with concerns of sepsis of unclear etiology.  He will admit for continued observation and possibly continued antibiotics and fluids. [VB]    Clinical Course User  Index [VB] Mardene Sayer, MD                            Medical Decision Making  ARDA KEADLE is a 72 y.o. male. With pmh COPD/emphysema, CAD, AAA, CF presents from home after feeling generally unwell with episode of uncontrollable shakes found hypotensive 80/40 by EMS with shortness of breath and nausea and given 1L IVF bolus and 324 ASA now feeling generally improved.  With patient's shakes and hypotension with EMS, concern for possible rigors and associated bacteremia. On arrival to ED, patient's pressure 90/70 which is soft for patient.  No tachycardia, no hypoxia.  Neurologically intact.  EKG obtained sinus rhythm wide QRS with left axis deviation and intermittent PVCs/ventricular bigeminy but no acute ST/T changes concerning for STEMI.  Started sepsis workup.   Labs reviewed, leukocytosis 13.2 with left shift , lactate 2.3, concerning for sepsis as suspected.  Platelet count 142, hemoglobin 12.8 slightly down trended from baseline but normal PT PTT no concern for DIC.  Creatinine 1.49 slightly elevated from baseline mild AKI.  No transaminitis.  pH 7.36 with mild respiratory alkalosis pCO2 43.  Chest x-ray personally reviewed by me, read by radiology as no evidence of pneumonia, on my personal read suspected some mild hilar infiltrate.  Hypomagnesemia 5 ordered 2 g IV magnesium sulfate.  Reassessed patient, blood pressure remaining with MAP greater than 65 currently 100/60 no acute distress not currently on oxygen.  CTA dissection resulted no acute pathology, questionable right common iliac artery dissection.  Spoke with on-call vascular surgeon who reviewed patient's imaging and said nothing to do, appears chronic in nature.  Will arrange for outpatient follow-up regarding AAA.  Spoke with Dr. Katrinka Blazing with concerns of sepsis of unclear etiology.  He will admit for continued observation and possibly continued antibiotics and fluids.    Amount and/or Complexity of Data Reviewed Labs:  ordered. Radiology: ordered.  Risk Prescription drug management. Decision regarding hospitalization.    Final Clinical Impression(s) / ED Diagnoses Final diagnoses:  Hypotension, unspecified hypotension type  Sepsis, due to unspecified organism, unspecified whether acute organ dysfunction present (HCC)  AKI (acute kidney injury) (HCC)  Hypomagnesemia    Rx / DC Orders ED Discharge Orders     None         Mardene Sayer, MD 07/03/22 1125

## 2022-07-03 NOTE — H&P (Addendum)
History and Physical    Patient: Matthew Evans [id number] DOB: [birth year] DOA: 07/03/2022 DOS: the patient was seen and examined on 07/03/2022 PCP: Vassie Moment, NP (Inactive)  Patient coming from: Home  Chief Complaint:  Chief Complaint  Patient presents with   Code Sepsis   HPI: JAYSTON FOLGAR is a 72 y.o. male with medical history significant of hypertension, hyperlipidemia, CAD, COPD, CVA, AAA who presented with complaints of chills.  He was woken out of his sleep at around 1 AM with shaking chills.  He states he had been in his normal state of health prior to this.  He reported having some shortness of breath with weakness.  He tried to get up from the gym multiple times and was unable to.  His wife reported him having 1 loose bowel movement that was dark in color prior to coming to the hospital.  He is on Plavix due to the history of coronary artery disease, but denies any blood in the stools to his knowledge.  Denies any significant nausea, vomiting,  dysuria, fall/trauma, or recent medication changes.  Patient did note while in the hospital he had a intermittent cough with some chest discomfort, but notes that he had not been coughing prior to arrival  In the emergency department patient was noted to be afebrile with respirations 17-27, blood pressure as low as 79/47 with improvement with IV fluids to 120/64, and O2 saturations currently maintained on room air.  Labs significant for WBC 13.2, hemoglobin 12.8, platelet count 142, BUN 19, creatinine 1.49, calcium 8.1, magnesium 1.5, BNP 25.1,  CK 526, and lactic acid 2.3.  CT angiogram of the chest abdomen pelvis noted no acute vascular findings with patient noted to have a infrarenal abdominal aneurysm measuring up to 3.6 cm in diameter with possible short segment dissection within the right common iliac artery and patchy groundglass opacities in both lungs similar to prior study concerning for scarring or atelectasis.  Blood cultures have  been obtained.  Patient has been bolused at least 2 L of normal saline IV fluids, 2 g of magnesium sulfate, and given empiric antibiotics of vancomycin and cefepime.  Review of Systems: As mentioned in the history of present illness. All other systems reviewed and are negative. Past Medical History:  Diagnosis Date   AAA (abdominal aortic aneurysm) ([place])    mild; doppler 08/29/12-3.4x3.7   Arthritis    "hands, back" (09/28/2016)   CAD (coronary artery disease)    echo 07/26/12-EF 55-60%; myoview 07/26/12-low normal wall motion with mild apical hypokinesis as well a septal wall motion consistent with LBBB   Carotid artery occlusion    Chronic lower back pain    Cutaneous lupus erythematosus    [name], [name]   Dementia ([place])    "moderate; dx'd in ~ 2011" (09/28/2016)   Dermatitis    Emphysema    GERD (gastroesophageal reflux disease)    H/O hiatal hernia    Headache(784.0)    History of palpitations    HTN (hypertension)    Hyperlipidemia    Hypothyroidism    HX OF RADIOACTIVE IODINE    Memory loss    d/t mini stroke   Mini stroke ~ 2011   Myocardial infarction ([place]) 09/2007   PCI to RCA   Shortness of breath    WITH EXERTION   Sleep apnea    sleep study 12/14/06 ahi 16.89, during rem 12.3,  supine ahi 57.49; cpap 02/22/07-c-flex of 3 at 13cm h2o; PT STATES HE DOES NOT USE HIS CPAP EVERY NIGHT   Sleep apnea    "aien't worn mask in 3 years" (09/28/2016)   Stroke ([facility])    PT STATES NEUROLOGIST TOLD HIM HE HAD HAD MINI STROKE   Thyroid disease    hypothyroidism   Past Surgical History:  Procedure Laterality Date   balloon atherotomy     LAD   BICEPS TENDON REPAIR Right 09/25/2016   "cleaned out spurs and arthritis" (09/28/2016)   CARDIAC CATHETERIZATION  08/11/11   EF50-55%, small AAA, no signifiant stent restenosis in RCA , no restenosis of PTCA of LAD, medical therapy   CARDIAC CATHETERIZATION  09/27/06    cutting balloon arthrotomy of LAD stenosis with dilatation of a 3.5x12mm cutting balloon   CARDIAC CATHETERIZATION  01/07/09   50-60% progressive dz beyond the stented segment in the RCA , LAD intervention remained patent   CORONARY ANGIOPLASTY WITH STENT PLACEMENT  09/24/06   PTCA/stent of RCA with a 3.5x40mm Taxus DES post dilated to 3.15mm   CORONARY ANGIOPLASTY WITH STENT PLACEMENT Right 09/2007   ESOPHAGEAL MANOMETRY N/A 08/04/2013   Procedure: ESOPHAGEAL MANOMETRY (EM);  Surgeon: Theda Belfast, MD;  Location: [facility];  Service: Endoscopy;  Laterality: N/A;   HERNIA REPAIR     HIATAL HERNIA REPAIR  2011?   LEFT HEART CATH AND CORONARY ANGIOGRAPHY N/A 10/02/2016   Procedure: Left Heart Cath and Coronary Angiography;  Surgeon: Marykay Lex, MD;  Location: [facility] INVASIVE CV LAB;  Service: Cardiovascular;  Laterality: N/A;   LEFT HEART CATHETERIZATION WITH CORONARY ANGIOGRAM N/A 08/11/2011   Procedure: LEFT HEART CATHETERIZATION WITH CORONARY ANGIOGRAM;  Surgeon: Lennette Bihari, MD;  Location: [facility] CATH LAB;  Service: Cardiovascular;  Laterality: N/A;   SHOULDER SURGERY Right    Social History:  reports that he quit smoking about 12 years ago. His smoking use included cigarettes. He has a 90.00 pack-year smoking history. He has never used smokeless tobacco. He reports that he does not drink alcohol and does not use drugs.  Allergies  Allergen Reactions   Niaspan [Niacin] Rash   Septra [Sulfamethoxazole-Trimethoprim] Rash   Simcor [Niacin-Simvastatin Er] Rash   Zocor [Simvastatin] Rash    Family History  Problem Relation Age of Onset   Emphysema Mother    Heart disease Father    Mesothelioma Father    Heart attack Father     Prior to Admission medications   Medication Sig Start Date End Date Taking? Authorizing Provider  aspirin EC (ASPIRIN LOW DOSE) 81 MG tablet TAKE 1 TABLET (81 MG TOTAL) BY MOUTH DAILY. PATIENT NEEDS APPOINTMENT FOR ANY FUTURE REFILLS. 12/14/21   Lennette Bihari, MD   clopidogrel (PLAVIX) 75 MG tablet Take 1 tablet (75 mg total) by mouth daily. Schedule an appointment for further refills 2nd attempt 05/26/22   Lennette Bihari, MD  Fluticasone-Umeclidin-Vilant (TRELEGY ELLIPTA) 100-62.5-25 MCG/INH AEPB Inhale 1 puff into the lungs daily. 09/08/20   Oretha Milch, MD  hydrochlorothiazide (MICROZIDE) 12.5 MG capsule TAKE 1 CAPSULE BY MOUTH EVERY OTHER DAY. CONTACT THE OFFICE TO SCHEDULE APPOINTMENT FOR REFILLS 04/10/22   Lennette Bihari, MD  HYDROcodone-acetaminophen (NORCO) 7.5-325 MG tablet Take 1 tablet by mouth every 6 (six) hours as needed for moderate pain or severe pain.  08/31/18   [provider]  isosorbide mononitrate (IMDUR) 60 MG 24 hr tablet TAKE 1 AND 1/2 TABLET (90 MG TOTAL) BY MOUTH DAILY.  06/12/22   Troy Sine, MD  losartan (COZAAR) 50 MG tablet TAKE 1 TABLET BY MOUTH EVERY DAY 12/12/21   Troy Sine, MD  metoprolol succinate (TOPROL-XL) 25 MG 24 hr tablet TAKE 1/2 TABLET BY MOUTH EVERY DAY 09/26/21   Troy Sine, MD  Omega-3 Fatty Acids (FISH OIL) 1200 MG CAPS Take 1,200 mg by mouth 2 (two) times daily.     [provider]  rosuvastatin (CRESTOR) 5 MG tablet Take 5 mg by mouth daily. 02/10/20   [provider]  SYNTHROID 300 MCG tablet Take 300 mcg by mouth daily. 12/15/19   [provider]  umeclidinium-vilanterol (ANORO ELLIPTA) 62.5-25 MCG/ACT AEPB Inhale 1 puff into the lungs daily. 03/16/22   Rigoberto Noel, MD  umeclidinium-vilanterol (ANORO ELLIPTA) 62.5-25 MCG/INH AEPB Inhale 1 puff into the lungs daily. INHALE 1 PUFF BY MOUTH EVERY DAY 12/17/20   Rigoberto Noel, MD    Physical Exam: Vitals:   07/03/22 0930 07/03/22 1000 07/03/22 1030 07/03/22 1040  BP: (!) 90/52 (!) 104/51 114/60 120/64  Pulse: 67 86 66 65  Resp: 20 (!) 27 20 17   Temp:      TempSrc:      SpO2: 96% 97% 97% 95%  Weight:      Height:        Constitutional: Elderly man who appears to be in no acute distress at this  time. Eyes: PERRL, lids and conjunctivae normal ENMT: Mucous membranes are moist. Posterior pharynx clear of any exudate or lesions  Neck: normal, supple, no masses, no thyromegaly Respiratory: clear to auscultation bilaterally, no wheezing, no crackles. Normal respiratory effort. No accessory muscle use.  Cardiovascular: Regular rate and rhythm, no murmurs / rubs / gallops. No extremity edema. 2+ pedal pulses. No carotid bruits.  Abdomen: no tenderness, no masses palpated. No hepatosplenomegaly. Bowel sounds positive.  Rectal exam performed with negative stool guaiac appreciated. Musculoskeletal: no clubbing / cyanosis. No joint deformity upper and lower extremities. Good ROM, no contractures. Normal muscle tone.  Skin: no rashes, lesions, ulcers. No induration Neurologic: CN 2-12 grossly intact. Sensation intact, DTR normal. Strength 5/5 in all 4.  Psychiatric: Normal judgment and insight. Alert and oriented x 3. Normal mood.   Data Reviewed:  EKG reveals sinus rhythm 85 bpm with QTc 482 ventricular bigeminy.  Reviewed labs, imaging and pertinent records as noted above in HPI.  Assessment and Plan: SIRS Patient present with reports of chills, tachypnea, and white blood cell count elevated up to 13.2.  Influenza, COVID-19, and RSV screening were negative.  Lactic acid was noted to be elevated up to 2.3. -Admit to a progressive bed -Follow-up blood cultures -Check  procalcitonin and MRSA screen -Check complete respiratory virus panel given concern for possible viral etiology of patient's acute onset of symptoms -Continue empiric antibiotics of vancomycin and cefepime -Trend lactic acid -Tylenol as needed for fever  Transient hypotension Initial blood pressures noted to be as low as 79/47.  Patient had been ordered 2 L of normal saline IV fluids with improvement in blood pressures up to 120/64 -Goal MAP at least 65 -Hold home blood pressure regimen.  Reassess and determine when medically  appropriate appropriate to resume  Acute kidney injury Creatinine 1.51 with BUN 24.  Baseline creatinine previously noted to be around 1.1.  Urinalysis significant for elevated specific gravity without signs of infection. -Continue normal saline IV fluids -Hold possible nephrotoxic agents -Recheck kidney function tomorrow morning  Normocytic anemia Hemoglobin 12.8 with  normal MCV and MCH.  Rectal exam was performed and stool guaiacs were noted to be negative with brown color stools. -Recheck CBC tomorrow morning  Hypomagnesemia Acute.  Magnesium 1.5.  Patient has been given 2 g of magnesium sulfate IV -Continue to monitor and replace as needed   Hypocalcemia Acute.  Calcium noted to be 8.1 with ionized calcium 1.08. -Give 2 g of calcium gluconate IV -Continue to monitor and replace as needed  Hypothyroidism -Check TSH -Continue levothyroxine  CAD Patient reported some complaints of chest discomfort with coughing.  High-sensitivity troponin was negative. -Continue aspirin, Plavix, and statin  COPD, without acute exacerbation On physical exam patient without significant wheezing or rhonchi appreciated at this time.  O2 saturation maintained on room air. 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  Patient is followed in the outpatient setting by Dr. Claiborne Billings.  Case had been discussed with vascular surgery who recommended outpatient follow-up -Continue outpatient follow-up  DVT prophylaxis: Lovenox Advance Care Planning:   Code Status: Full Code   Consults: None  Family Communication: Wife updated at bedside  Severity of Illness: The appropriate patient status for this patient is  INPATIENT. Inpatient status is judged to be reasonable and necessary in order to provide the required intensity of service to ensure the patient's safety. The patient's presenting symptoms, physical exam findings, and initial radiographic and laboratory data in the context of their chronic comorbidities is felt to place them at high risk for further clinical deterioration. Furthermore, it is not anticipated that the patient will be medically stable for discharge from the hospital within 2 midnights of admission.   * I certify that at the point of admission it is my clinical judgment that the patient will require inpatient hospital care spanning beyond 2 midnights from the point of admission due to high intensity of service, high risk for further deterioration and high frequency of surveillance required.*  Author: Norval Morton, MD 07/03/2022 10:58 AM  For on call review [email or web address].

## 2022-07-03 NOTE — Progress Notes (Signed)
Pharmacy Antibiotic Note  Matthew Evans is a 72 y.o. male admitted on 07/03/2022 with Sepsis.  Pharmacy has been consulted for Vancomycin and Cefepime dosing.  CrCl 50 mL/min  Plan: Vancomycin 2000mg  IV once then 1000mg  Q24H (eAUC 535, Scr 1.44, Vd 0.5) Cefepime 2g Q12H  F/u cultures for de-escalation Monitor renal function for dose adjustments    No data recorded.  No results for input(s): "WBC", "CREATININE", "LATICACIDVEN", "VANCOTROUGH", "VANCOPEAK", "VANCORANDOM", "GENTTROUGH", "GENTPEAK", "GENTRANDOM", "TOBRATROUGH", "TOBRAPEAK", "TOBRARND", "AMIKACINPEAK", "AMIKACINTROU", "AMIKACIN" in the last 168 hours.  CrCl cannot be calculated (Patient's most recent lab result is older than the maximum 21 days allowed.).    Allergies  Allergen Reactions   Niaspan [Niacin] Rash   Septra [Sulfamethoxazole-Trimethoprim] Rash   Simcor [Niacin-Simvastatin Er] Rash   Zocor [Simvastatin] Rash    Thank you for allowing pharmacy to be a part of this patient's care.  Merrilee Jansky, PharmD Clinical Pharmacist 07/03/2022 7:46 AM

## 2022-07-04 DIAGNOSIS — I714 Abdominal aortic aneurysm, without rupture, unspecified: Secondary | ICD-10-CM

## 2022-07-04 DIAGNOSIS — I2542 Coronary artery dissection: Secondary | ICD-10-CM

## 2022-07-04 DIAGNOSIS — E039 Hypothyroidism, unspecified: Secondary | ICD-10-CM

## 2022-07-04 DIAGNOSIS — R6510 Systemic inflammatory response syndrome (SIRS) of non-infectious origin without acute organ dysfunction: Secondary | ICD-10-CM | POA: Diagnosis not present

## 2022-07-04 LAB — CBC
HCT: 36.3 % — ABNORMAL LOW (ref 39.0–52.0)
Hemoglobin: 11.8 g/dL — ABNORMAL LOW (ref 13.0–17.0)
MCH: 29.6 pg (ref 26.0–34.0)
MCHC: 32.5 g/dL (ref 30.0–36.0)
MCV: 91.2 fL (ref 80.0–100.0)
Platelets: 132 10*3/uL — ABNORMAL LOW (ref 150–400)
RBC: 3.98 MIL/uL — ABNORMAL LOW (ref 4.22–5.81)
RDW: 14.6 % (ref 11.5–15.5)
WBC: 8.4 10*3/uL (ref 4.0–10.5)
nRBC: 0 % (ref 0.0–0.2)

## 2022-07-04 LAB — BASIC METABOLIC PANEL
Anion gap: 4 — ABNORMAL LOW (ref 5–15)
BUN: 14 mg/dL (ref 8–23)
CO2: 23 mmol/L (ref 22–32)
Calcium: 7.8 mg/dL — ABNORMAL LOW (ref 8.9–10.3)
Chloride: 111 mmol/L (ref 98–111)
Creatinine, Ser: 1.24 mg/dL (ref 0.61–1.24)
GFR, Estimated: >60 mL/min (ref >60)
Glucose, Bld: 101 mg/dL — ABNORMAL HIGH (ref 70–99)
Potassium: 3.8 mmol/L (ref 3.5–5.1)
Sodium: 138 mmol/L (ref 135–145)

## 2022-07-04 LAB — PROCALCITONIN: Procalcitonin: 0.33 ng/mL

## 2022-07-04 LAB — SEDIMENTATION RATE: Sed Rate: 17 mm/hr — ABNORMAL HIGH (ref 0–16)

## 2022-07-04 LAB — T4, FREE: Free T4: 0.53 ng/dL — ABNORMAL LOW (ref 0.61–1.12)

## 2022-07-04 LAB — ALBUMIN: Albumin: 2.8 g/dL — ABNORMAL LOW (ref 3.5–5.0)

## 2022-07-04 LAB — C-REACTIVE PROTEIN: CRP: 1.7 mg/dL — ABNORMAL HIGH (ref <1.0)

## 2022-07-04 LAB — MAGNESIUM: Magnesium: 1.9 mg/dL (ref 1.7–2.4)

## 2022-07-04 MED ORDER — SODIUM CHLORIDE 0.9 % IV SOLN
2.0000 g | INTRAVENOUS | Status: DC
  Administered 2022-07-04 – 2022-07-05 (×2): 2 g via INTRAVENOUS
  Filled 2022-07-04 (×2): qty 20

## 2022-07-04 MED ORDER — LEVOTHYROXINE SODIUM 75 MCG PO TABS
150.0000 ug | ORAL_TABLET | Freq: Every day | ORAL | Status: DC
  Administered 2022-07-05: 150 ug via ORAL
  Filled 2022-07-04: qty 2

## 2022-07-04 NOTE — ED Notes (Signed)
Pt ambulated to the restroom with steady gait. 

## 2022-07-04 NOTE — Progress Notes (Addendum)
PROGRESS NOTE    Matthew Evans  XIP:[id number] DOB: 1950-08-24 DOA: 07/03/2022 PCP: Vassie Moment, NP (Inactive)  Chief Complaint  Patient presents with   Code Sepsis    Brief Narrative:  Matthew Evans is Matthew Evans 72 y.o. male with medical history significant of hypertension, hyperlipidemia, CAD, COPD, CVA, AAA who presented with complaints of chills.  He was woken out of his sleep at around 1 AM with shaking chills.  He states he had been in his normal state of health prior to this.  He reported having some shortness of breath with weakness.  He's been admitted with SIRS and hypotension and is being worked up for Hurshell Dino suspected infection.  Assessment & Plan:   Principal Problem:   SIRS (systemic inflammatory response syndrome) (HCC) Active Problems:   Transient hypotension   AKI (acute kidney injury) ([place])   Normocytic anemia   Hypomagnesemia   Hypocalcemia   Hypothyroid   CAD (coronary artery disease)   COPD (chronic obstructive pulmonary disease) (HCC)   AAA (abdominal aortic aneurysm) (HCC)  SIRS  Rigors  Generalized Weakness Patient present with reports of chills, tachypnea, and white blood cell count elevated up to 13.2.  Influenza, COVID-19, and RSV screening were negative.  RVP negative.  Lactic acid was mildly elevated up to 2.3.  CT chest/abdomen/pelvis without obvious infection. - story concerning for sepsis/gram negative bacteremia - blood cultures pending, but it appears these were collected after antibiotic  - UA with negative nitrite/leukocytes  - procal minimally elevated.  Follow CRP/ESR. - negative MRSA PCR - given story of rigors/hypotension concerning for sepsis/bacteremia as well as cultures collected after antibiotics, will treat empirically for now.  Will d/c vanc, narrow abx to ceftriaxone.  Would treat him empirically even if cultures returned negative for at least 7 days.   Transient hypotension Initial blood pressures noted to be as low as 79/47.  improved  with IVF. -currently HCTZ, losartan, metoprolol, imdur on hold with concern for sepsis   Acute kidney injury Creatinine 1.51 with BUN 24.  Baseline creatinine previously noted to be around 1.1.  Urinalysis significant for elevated specific gravity without signs of infection. -improving, will trend   Normocytic anemia Continue to monitor    Hypomagnesemia Replace and follow   Hypocalcemia Correct for albumin, replace and follow   Hypothyroidism -Check TSH (elevated, suggesting he's either underdosed or non adherent) -Continue levothyroxine - he's supposed to take 300 mcg M W F, will clarify with him (wife takes care of his meds, she notes he was previously on 200 mcg daily, told this dose was too high, then switched to 300 mcg 3x/week -> equivalent of ~128 daily -> will increase to 150 mcg daily, they can cut pills at home in half - repeat labs in 6 weeks).   CAD Patient reported some complaints of chest discomfort with coughing.  High-sensitivity troponin was minimally elevated, suspect demand in setting of hypotension, not c/w ACS. -Continue aspirin, Plavix, and statin   COPD, without acute exacerbation On physical exam patient without significant wheezing or rhonchi appreciated at this time.  O2 saturation maintained on room air. 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  Patient is followed in the outpatient setting by Dr. Claiborne Billings.  Case had been discussed with vascular surgery who recommended outpatient follow-up -Continue outpatient follow-up  Obesity Body mass index is 31.02 kg/m.      DVT prophylaxis: lovenox Code Status: full Family Communication: wife Disposition:    Status is: Inpatient Remains inpatient appropriate because: pending workup   Consultants:  none  Procedures:  none  Antimicrobials:  Anti-infectives (From admission, onward)    Start     Dose/Rate Route Frequency Ordered Stop   07/04/22 1000  vancomycin (VANCOCIN) IVPB 1000 mg/200 mL premix        1,000 mg 200 mL/hr over 60 Minutes Intravenous Every 24 hours 07/03/22 0944     07/04/22 1000  cefTRIAXone (ROCEPHIN) 2 g in sodium chloride 0.9 % 100 mL IVPB        2 g 200 mL/hr over 30 Minutes Intravenous Every 24 hours 07/04/22 0900     07/03/22 2200  ceFEPIme (MAXIPIME) 2 g in sodium chloride 0.9 % 100 mL IVPB  Status:  Discontinued        2 g 200 mL/hr over 30 Minutes Intravenous Every 12 hours 07/03/22 0940 07/04/22 0900   07/03/22 0830  vancomycin (VANCOCIN) IVPB 1000 mg/200 mL premix       See Hyperspace for full Linked Orders Report.   1,000 mg 200 mL/hr over 60 Minutes Intravenous  Once 07/03/22 0827 07/03/22 1208   07/03/22 0830  vancomycin (VANCOCIN) IVPB 1000 mg/200 mL premix       See Hyperspace for full Linked Orders Report.   1,000 mg 200 mL/hr over 60 Minutes Intravenous  Once 07/03/22 0827 07/03/22 1202   07/03/22 0800  vancomycin (VANCOCIN) IVPB 1000 mg/200 mL premix  Status:  Discontinued       See Hyperspace for full Linked Orders Report.   1,000 mg 200 mL/hr over 60 Minutes Intravenous  Once 07/03/22 0746 07/03/22 0822   07/03/22 0800  vancomycin (VANCOCIN) IVPB 1000 mg/200 mL premix  Status:  Discontinued       See Hyperspace for full Linked Orders Report.   1,000 mg 200 mL/hr over 60 Minutes Intravenous  Once 07/03/22 0746 07/03/22 0822   07/03/22 0745  ceFEPIme (MAXIPIME) 2 g in sodium chloride 0.9 % 100 mL IVPB        2 g 200 mL/hr over 30 Minutes Intravenous  Once 07/03/22 0733 07/03/22 1047   07/03/22 0745  vancomycin (VANCOCIN) IVPB 1000 mg/200 mL premix  Status:  Discontinued        1,000 mg 200 mL/hr over 60 Minutes Intravenous  Once 07/03/22  0733 07/03/22 0746       Subjective: No complaints Feeling better today, he's been up and about   Objective: Vitals:   07/04/22 0730 07/04/22 0800 07/04/22 0830 07/04/22 0900  BP: 129/62 (!) 132/92 (!) 114/49 (!) 131/58  Pulse: (!) 46 (!) 50 (!) 47 (!) 49  Resp: (!) 23 (!) 26 18 20   Temp:      TempSrc:      SpO2: 96% 97% 98% 99%  Weight:      Height:        Intake/Output Summary (Last 24 hours) at 07/04/2022 0926 Last data filed at 07/03/2022 1048 Gross per 24 hour  Intake 1000 ml  Output --  Net 1000 ml   Filed Weights   07/03/22 0847  Weight: 92.5 kg    Examination:  General exam: Appears calm and comfortable  Respiratory system: unlabored, no adventitious lung sounds Cardiovascular system: RRR Gastrointestinal system: Abdomen is  nondistended, soft and nontender Central nervous system: Alert and oriented. No focal neurological deficits. Extremities: no LEE   Data Reviewed: I have personally reviewed following labs and imaging studies  CBC: Recent Labs  Lab 07/03/22 0800 07/03/22 0823 07/04/22 0348  WBC 13.2*  --  8.4  NEUTROABS 12.2*  --   --   HGB 12.8* 13.9 11.8*  HCT 39.5 41.0 36.3*  MCV 90.8  --  91.2  PLT 142*  --  132*    Basic Metabolic Panel: Recent Labs  Lab 07/03/22 0800 07/03/22 0823 07/04/22 0348  NA 139 141 138  K 4.0 3.9 3.8  CL 103  --  111  CO2 25  --  23  GLUCOSE 98  --  101*  BUN 19  --  14  CREATININE 1.49*  --  1.24  CALCIUM 8.1*  --  7.8*  MG 1.5*  --   --     GFR: Estimated Creatinine Clearance: 60.3 mL/min (by C-G formula based on SCr of 1.24 mg/dL).  Liver Function Tests: Recent Labs  Lab 07/03/22 0800  AST 39  ALT 16  ALKPHOS 69  BILITOT 0.9  PROT 5.8*  ALBUMIN 3.2*    CBG: No results for input(s): "GLUCAP" in the last 168 hours.   Recent Results (from the past 240 hour(s))  Resp panel by RT-PCR (RSV, Flu Winslow Ederer&B, Covid) Anterior Nasal Swab     Status: None   Collection Time: 07/03/22  7:38 AM    Specimen: Anterior Nasal Swab  Result Value Ref Range Status   SARS Coronavirus 2 by RT PCR NEGATIVE NEGATIVE Final    Comment: (NOTE) SARS-CoV-2 target nucleic acids are NOT DETECTED.  The SARS-CoV-2 RNA is generally detectable in upper respiratory specimens during the acute phase of infection. The lowest concentration of SARS-CoV-2 viral copies this assay can detect is 138 copies/mL. Marquies Wanat negative result does not preclude SARS-Cov-2 infection and should not be used as the sole basis for treatment or other patient management decisions. Orman Matsumura negative result may occur with  improper specimen collection/handling, submission of specimen other than nasopharyngeal swab, presence of viral mutation(s) within the areas targeted by this assay, and inadequate number of viral copies(<138 copies/mL). Verla Bryngelson negative result must be combined with clinical observations, patient history, and epidemiological information. The expected result is Negative.  Fact Sheet for Patients:  EntrepreneurPulse.com.au  Fact Sheet for Healthcare Providers:  IncredibleEmployment.be  This test is no t yet approved or cleared by the [place] FDA and  has been authorized for detection and/or diagnosis of SARS-CoV-2 by FDA under an Emergency Use Authorization (EUA). This EUA will remain  in effect (meaning this test can be used) for the duration of the COVID-19 declaration under Section 564(b)(1) of the Act, 21 U.S.C.section 360bbb-3(b)(1), unless the authorization is terminated  or revoked sooner.       Influenza Drea Jurewicz by PCR NEGATIVE NEGATIVE Final   Influenza B by PCR NEGATIVE NEGATIVE Final    Comment: (NOTE) The Xpert Xpress SARS-CoV-2/FLU/RSV plus assay is intended as an aid in the diagnosis of influenza from Nasopharyngeal swab specimens and should not be used as Rayvin Abid sole basis for treatment. Nasal washings and aspirates are unacceptable for Xpert Xpress  SARS-CoV-2/FLU/RSV testing.  Fact Sheet for Patients: EntrepreneurPulse.com.au  Fact Sheet for Healthcare Providers: IncredibleEmployment.be  This test is not yet approved or cleared by the [place] FDA and has been authorized for detection and/or diagnosis of SARS-CoV-2 by FDA under an Emergency Use Authorization (  EUA). This EUA will remain in effect (meaning this test can be used) for the duration of the COVID-19 declaration under Section 564(b)(1) of the Act, 21 U.S.C. section 360bbb-3(b)(1), unless the authorization is terminated or revoked.     Resp Syncytial Virus by PCR NEGATIVE NEGATIVE Final    Comment: (NOTE) Fact Sheet for Patients: BloggerCourse.com  Fact Sheet for Healthcare Providers: SeriousBroker.it  This test is not yet approved or cleared by the [place] FDA and has been authorized for detection and/or diagnosis of SARS-CoV-2 by FDA under an Emergency Use Authorization (EUA). This EUA will remain in effect (meaning this test can be used) for the duration of the COVID-19 declaration under Section 564(b)(1) of the Act, 21 U.S.C. section 360bbb-3(b)(1), unless the authorization is terminated or revoked.  Performed at [facility], [address] [place], [place] [address]   Respiratory (~20 pathogens) panel by PCR     Status: None   Collection Time: 07/03/22  7:38 AM   Specimen: Nasopharyngeal Swab; Respiratory  Result Value Ref Range Status   Adenovirus NOT DETECTED NOT DETECTED Final   Coronavirus 229E NOT DETECTED NOT DETECTED Final    Comment: (NOTE) The Coronavirus on the Respiratory Panel, DOES NOT test for the novel  Coronavirus (2019 nCoV)    Coronavirus HKU1 NOT DETECTED NOT DETECTED Final   Coronavirus NL63 NOT DETECTED NOT DETECTED Final   Coronavirus OC43 NOT DETECTED NOT DETECTED Final   Metapneumovirus NOT DETECTED NOT DETECTED Final    Rhinovirus / Enterovirus NOT DETECTED NOT DETECTED Final   Influenza Tailynn Armetta NOT DETECTED NOT DETECTED Final   Influenza B NOT DETECTED NOT DETECTED Final   Parainfluenza Virus 1 NOT DETECTED NOT DETECTED Final   Parainfluenza Virus 2 NOT DETECTED NOT DETECTED Final   Parainfluenza Virus 3 NOT DETECTED NOT DETECTED Final   Parainfluenza Virus 4 NOT DETECTED NOT DETECTED Final   Respiratory Syncytial Virus NOT DETECTED NOT DETECTED Final   Bordetella pertussis NOT DETECTED NOT DETECTED Final   Bordetella Parapertussis NOT DETECTED NOT DETECTED Final   Chlamydophila pneumoniae NOT DETECTED NOT DETECTED Final   Mycoplasma pneumoniae NOT DETECTED NOT DETECTED Final    Comment: Performed at [facility], [address], [place], [place] [address]  MRSA Next Gen by PCR, Nasal     Status: None   Collection Time: 07/03/22  9:40 AM   Specimen: Nasal Mucosa; Nasal Swab  Result Value Ref Range Status   MRSA by PCR Next Gen NOT DETECTED NOT DETECTED Final    Comment: (NOTE) The GeneXpert MRSA Assay (FDA approved for NASAL specimens only), is one component of Jaci Desanto comprehensive MRSA colonization surveillance program. It is not intended to diagnose MRSA infection nor to guide or monitor treatment for MRSA infections. Test performance is not FDA approved in patients less than 36 years old. Performed at [facility], [address], [place], [place] [address]   Blood Culture (routine x 2)     Status: None (Preliminary result)   Collection Time: 07/03/22 11:48 AM   Specimen: BLOOD  Result Value Ref Range Status   Specimen Description BLOOD RIGHT ANTECUBITAL  Final   Special Requests   Final    BOTTLES DRAWN AEROBIC AND ANAEROBIC Blood Culture adequate volume   Culture   Final    NO GROWTH < 24 HOURS Performed at [facility], [address], [place], [place] [address]    Report Status PENDING  Incomplete  Radiology Studies: CT Angio Chest/Abd/Pel for Dissection W and/or  Wo Contrast  Result Date: 07/03/2022 CLINICAL DATA:  Hypotension and shortness of breath. History of abdominal aortic aneurysm. Evaluate for rupture. EXAM: CT ANGIOGRAPHY CHEST, ABDOMEN AND PELVIS TECHNIQUE: Non-contrast CT of the chest was initially obtained. Multidetector CT imaging through the chest, abdomen and pelvis was performed using the standard protocol during bolus administration of intravenous contrast. Multiplanar reconstructed images and MIPs were obtained and reviewed to evaluate the vascular anatomy. RADIATION DOSE REDUCTION: This exam was performed according to the departmental dose-optimization program which includes automated exposure control, adjustment of the mA and/or kV according to patient size and/or use of iterative reconstruction technique. CONTRAST:  28mL OMNIPAQUE IOHEXOL 350 MG/ML SOLN COMPARISON:  Chest CTA 10/15/2018 FINDINGS: CTA CHEST FINDINGS Cardiovascular: Atherosclerosis of the aorta, great vessels and coronary arteries. No evidence of aortic aneurysm or dissection. No evidence of acute pulmonary embolism. Stable mild cardiomegaly. No significant pericardial effusion. Mediastinum/Nodes: There are no enlarged mediastinal, hilar or axillary lymph nodes. The thyroid gland, trachea and esophagus demonstrate no significant findings. Lungs/Pleura: No pleural effusion or pneumothorax. Stable peripheral scarring in the right lung adjacent to chronic rib fractures. Patchy ground-glass opacities in both lungs are similar to previous study and may be due to scarring or atelectasis. No confluent airspace disease. Musculoskeletal/Chest wall: No chest wall mass or suspicious osseous findings. Multiple old right-sided rib fractures. Review of the MIP images confirms the above findings. CTA ABDOMEN AND PELVIS FINDINGS VASCULAR Aorta: Diffuse aortic atherosclerosis with an infrarenal abdominal aortic aneurysm measuring up to 3.6 x 3.5 cm on image 201/7. Associated irregular mural thrombus. No  aortic dissection or rupture identified. Celiac: Atherosclerosis. Patent without evidence of aneurysm, dissection or significant stenosis. SMA: Atherosclerosis. Patent without evidence of aneurysm, dissection or significant stenosis. Renals: Both renal arteries are patent without evidence of aneurysm, dissection or significant stenosis. Mild atherosclerosis. Small accessory left renal artery. IMA: Patent without evidence of aneurysm, dissection, vasculitis or significant stenosis. Inflow: Possible short-segment dissection within the right common iliac artery which measures up to 1.9 cm in diameter. Veins: No obvious venous abnormality within the limitations of this arterial phase study. Review of the MIP images confirms the above findings. NON-VASCULAR FINDINGS Hepatobiliary: Mild contour irregularity of the liver, suggesting possible early cirrhosis. No focal lesions are identified. No evidence of gallstones, gallbladder wall thickening or biliary dilatation. Pancreas: Unremarkable. No pancreatic ductal dilatation or surrounding inflammatory changes. Spleen: Normal in size without focal abnormality. Adrenals/Urinary Tract: Both adrenal glands appear normal. The kidneys appear normal without evidence of urinary tract calculus, suspicious lesion or hydronephrosis. The bladder appears normal for its degree of distention. Stomach/Bowel: No enteric contrast administered. The stomach appears unremarkable for its degree of distension. No evidence of bowel wall thickening, distention or surrounding inflammatory change. The cecum is located anteriorly in the right mid abdomen. The appendix is not clearly visualized, although there is no pericecal inflammation to suggest appendicitis. Mild distal colonic diverticulosis noted. Lymphatic: There are no enlarged abdominal or pelvic lymph nodes. Reproductive: The prostate gland and seminal vesicles appear unremarkable. Other: Mildly prominent fat in both inguinal canals. No  ascites, free air or focal extraluminal fluid collection. Musculoskeletal: No acute or significant osseous findings. Lower lumbar spondylosis with degenerative disc disease most advanced at L5-S1. Review of the MIP images confirms the above findings. IMPRESSION: 1. No acute vascular findings identified. 2. Infrarenal abdominal aortic aneurysm measuring up to 3.6 cm in diameter. Recommend follow-up ultrasound every 2 years.  This recommendation follows ACR consensus guidelines: White Paper of the ACR Incidental Findings Committee II on Vascular Findings. J Am Coll Radiol 2013; 10:789-794. 3. Possible short-segment dissection within the right common iliac artery. 4. No evidence of acute pulmonary embolism or other acute chest findings. 5. Stable chronic right-sided rib fractures with associated scarring in the right lung. Patchy ground-glass opacities in both lungs are similar to previous study and may be due to scarring or atelectasis. 6. No acute findings in the abdomen or pelvis. Electronically Signed   By: Carey Bullocks M.D.   On: 07/03/2022 10:15   DG Chest 2 View  Result Date: 07/03/2022 CLINICAL DATA:  Sepsis.  Weakness.  Dizziness. EXAM: CHEST - 2 VIEW COMPARISON:  11/29/2018 FINDINGS: Heart size is normal. Mediastinal shadows are normal. Chronic scarring on the right subsequent to old healed rib fractures. No sign of acute infiltrate, edema, collapse or effusion. IMPRESSION: No active disease. Chronic scarring on the right subsequent to old healed rib fractures. Electronically Signed   By: Paulina Fusi M.D.   On: 07/03/2022 08:03        Scheduled Meds:  aspirin EC  81 mg Oral Daily   clopidogrel  75 mg Oral Daily   enoxaparin (LOVENOX) injection  40 mg Subcutaneous Daily   levothyroxine  300 mcg Oral Q M,W,F   rosuvastatin  5 mg Oral Daily   sodium chloride flush  3 mL Intravenous Q12H   umeclidinium-vilanterol  1 puff Inhalation Daily   Continuous Infusions:  sodium chloride 100 mL/hr at  07/03/22 2015   cefTRIAXone (ROCEPHIN)  IV     vancomycin       LOS: 1 day    Time spent: over 30 min    Lacretia Nicks, MD [facility]   To contact the attending provider between 7A-7P or the covering provider during after hours 7P-7A, please log into the web site [email or web address] and access using universal [place] password for that web site. If you do not have the password, please call the hospital operator.  07/04/2022, 9:26 AM

## 2022-07-04 NOTE — ED Notes (Signed)
ED TO INPATIENT HANDOFF REPORT  ED Nurse Name and Phone #: Iona Coach Name/Age/Gender Matthew Evans 72 y.o. male Room/Bed: 030C/030C  Code Status   Code Status: Full Code  Home/SNF/Other Home Patient oriented to: self, place, time, and situation Is this baseline? Yes   Triage Complete: Triage complete  Chief Complaint SIRS (systemic inflammatory response syndrome) ([place]) [R65.10]  Triage Note Pt BIB [facility] and SOB since midnight and hypotension. BP 84/50 on scene.  1L NS given en route.  BP on arrival 108/76.  HR 90-100. 95% A.  Pt placed on 2L or comorts.   Pt has hx of NSTEMI.  324 ASA.  ECG normal.     Allergies Allergies  Allergen Reactions   Niaspan [Niacin] Rash   Septra [Sulfamethoxazole-Trimethoprim] Rash   Simcor [Niacin-Simvastatin Er] Rash   Zocor [Simvastatin] Rash    Level of Care/Admitting Diagnosis ED Disposition     ED Disposition  Admit   Condition  --   Comment  Hospital Area: [facility] [[id number]]  Level of Care: Telemetry Medical [104]  May admit patient to [facility] or [facility] if equivalent level of care is available:: No  Covid Evaluation: Confirmed COVID Negative  Diagnosis: SIRS (systemic inflammatory response syndrome) [facility]) [[id number]]  Admitting Physician: Elodia Florence [phone]  Attending Physician: Cephus Slater, Lamonte Sakai [[id number]]  Certification:: I certify this patient will need inpatient services for at least 2 midnights          B Medical/Surgery History Past Medical History:  Diagnosis Date   AAA (abdominal aortic aneurysm) ([place])    mild; doppler 08/29/12-3.4x3.7   Arthritis    "hands, back" (09/28/2016)   CAD (coronary artery disease)    echo 07/26/12-EF 55-60%; myoview 07/26/12-low normal wall motion with mild apical hypokinesis as well a septal wall motion consistent with LBBB   Carotid artery occlusion    Chronic lower back pain    Cutaneous lupus erythematosus    PT HAS JOINT PAIN AND  RASH ON CHEST, [name]   Dementia ([place])    "moderate; dx'd in ~ 2011" (09/28/2016)   Dermatitis    Emphysema    GERD (gastroesophageal reflux disease)    H/O hiatal hernia    Headache(784.0)    History of palpitations    HTN (hypertension)    Hyperlipidemia    Hypothyroidism    HX OF RADIOACTIVE IODINE    Memory loss    d/t mini stroke   Mini stroke ~ 2011   Myocardial infarction ([place]) 09/2007   PCI to RCA   Shortness of breath    WITH EXERTION   Sleep apnea    sleep study 12/14/06 ahi 16.89, during rem 12.3, supine ahi 57.49; cpap 02/22/07-c-flex of 3 at 13cm h2o; PT STATES HE DOES NOT USE HIS CPAP EVERY NIGHT   Sleep apnea    "aien't worn mask in 3 years" (09/28/2016)   Stroke ([place])    PT STATES NEUROLOGIST TOLD HIM HE HAD HAD MINI STROKE   Thyroid disease    hypothyroidism   Past Surgical History:  Procedure Laterality Date   balloon atherotomy     LAD   BICEPS TENDON REPAIR Right 09/25/2016   "cleaned out spurs and arthritis" (09/28/2016)   CARDIAC CATHETERIZATION  08/11/11   EF50-55%, small AAA, no signifiant stent restenosis in RCA , no restenosis of PTCA of LAD, medical therapy   CARDIAC CATHETERIZATION  09/27/06  cutting balloon arthrotomy of LAD stenosis with dilatation of a 3.5x33mm cutting balloon   CARDIAC CATHETERIZATION  01/07/09   50-60% progressive dz beyond the stented segment in the RCA , LAD intervention remained patent   CORONARY ANGIOPLASTY WITH STENT PLACEMENT  09/24/06   PTCA/stent of RCA with a 3.5x30mm Taxus DES post dilated to 3.90mm   CORONARY ANGIOPLASTY WITH STENT PLACEMENT Right 09/2007   ESOPHAGEAL MANOMETRY N/A 08/04/2013   Procedure: ESOPHAGEAL MANOMETRY (EM);  Surgeon: Theda Belfast, MD;  Location: [facility];  Service: Endoscopy;  Laterality: N/A;   HERNIA REPAIR     HIATAL HERNIA REPAIR  2011?   LEFT HEART CATH AND CORONARY ANGIOGRAPHY N/A 10/02/2016   Procedure: Left Heart Cath and Coronary  Angiography;  Surgeon: Marykay Lex, MD;  Location: [facility] INVASIVE CV LAB;  Service: Cardiovascular;  Laterality: N/A;   LEFT HEART CATHETERIZATION WITH CORONARY ANGIOGRAM N/A 08/11/2011   Procedure: LEFT HEART CATHETERIZATION WITH CORONARY ANGIOGRAM;  Surgeon: Lennette Bihari, MD;  Location: [facility] CATH LAB;  Service: Cardiovascular;  Laterality: N/A;   SHOULDER SURGERY Right      A IV Location/Drains/Wounds Patient Lines/Drains/Airways Status     Active Line/Drains/Airways     Name Placement date Placement time Site Days   Peripheral IV 07/03/22 18 G Anterior;Left;Upper Arm 07/03/22  0554  Arm  1   Peripheral IV 07/03/22 20 G Left;Posterior Forearm 07/03/22  0750  Forearm  1            Intake/Output Last 24 hours No intake or output data in the 24 hours ending 07/04/22 1117  Labs/Imaging Results for orders placed or performed during the hospital encounter of 07/03/22 (from the past 48 hour(s))  Lactic acid, plasma     Status: Abnormal   Collection Time: 07/03/22  7:35 AM  Result Value Ref Range   Lactic Acid, Venous 2.3 (HH) 0.5 - 1.9 mmol/L    Comment: CRITICAL RESULT CALLED TO, READ BACK BY AND VERIFIED WITH J.Markiah Janeway,RN 0920 07/03/22 CLARK,S Performed at [facility], [address] [place], [place] [address]   Resp panel by RT-PCR (RSV, Flu A&B, Covid) Anterior Nasal Swab     Status: None   Collection Time: 07/03/22  7:38 AM   Specimen: Anterior Nasal Swab  Result Value Ref Range   SARS Coronavirus 2 by RT PCR NEGATIVE NEGATIVE    Comment: (NOTE) SARS-CoV-2 target nucleic acids are NOT DETECTED.  The SARS-CoV-2 RNA is generally detectable in upper respiratory specimens during the acute phase of infection. The lowest concentration of SARS-CoV-2 viral copies this assay can detect is 138 copies/mL. A negative result does not preclude SARS-Cov-2 infection and should not be used as the sole basis for treatment or other patient management decisions. A negative result may  occur with  improper specimen collection/handling, submission of specimen other than nasopharyngeal swab, presence of viral mutation(s) within the areas targeted by this assay, and inadequate number of viral copies(<138 copies/mL). A negative result must be combined with clinical observations, patient history, and epidemiological information. The expected result is Negative.  Fact Sheet for Patients:  BloggerCourse.com  Fact Sheet for Healthcare Providers:  SeriousBroker.it  This test is no t yet approved or cleared by the [place] FDA and  has been authorized for detection and/or diagnosis of SARS-CoV-2 by FDA under an Emergency Use Authorization (EUA). This EUA will remain  in effect (meaning this test can be used) for the duration of the COVID-19 declaration under Section  564(b)(1) of the Act, 21 U.S.C.section 360bbb-3(b)(1), unless the authorization is terminated  or revoked sooner.       Influenza A by PCR NEGATIVE NEGATIVE   Influenza B by PCR NEGATIVE NEGATIVE    Comment: (NOTE) The Xpert Xpress SARS-CoV-2/FLU/RSV plus assay is intended as an aid in the diagnosis of influenza from Nasopharyngeal swab specimens and should not be used as a sole basis for treatment. Nasal washings and aspirates are unacceptable for Xpert Xpress SARS-CoV-2/FLU/RSV testing.  Fact Sheet for Patients: BloggerCourse.com  Fact Sheet for Healthcare Providers: SeriousBroker.it  This test is not yet approved or cleared by the [place] FDA and has been authorized for detection and/or diagnosis of SARS-CoV-2 by FDA under an Emergency Use Authorization (EUA). This EUA will remain in effect (meaning this test can be used) for the duration of the COVID-19 declaration under Section 564(b)(1) of the Act, 21 U.S.C. section 360bbb-3(b)(1), unless the authorization is terminated or revoked.      Resp Syncytial Virus by PCR NEGATIVE NEGATIVE    Comment: (NOTE) Fact Sheet for Patients: BloggerCourse.com  Fact Sheet for Healthcare Providers: SeriousBroker.it  This test is not yet approved or cleared by the [place] FDA and has been authorized for detection and/or diagnosis of SARS-CoV-2 by FDA under an Emergency Use Authorization (EUA). This EUA will remain in effect (meaning this test can be used) for the duration of the COVID-19 declaration under Section 564(b)(1) of the Act, 21 U.S.C. section 360bbb-3(b)(1), unless the authorization is terminated or revoked.  Performed at [facility], [address], [place], [place] [address]   Respiratory (~20 pathogens) panel by PCR     Status: None   Collection Time: 07/03/22  7:38 AM   Specimen: Nasopharyngeal Swab; Respiratory  Result Value Ref Range   Adenovirus NOT DETECTED NOT DETECTED   Coronavirus 229E NOT DETECTED NOT DETECTED    Comment: (NOTE) The Coronavirus on the Respiratory Panel, DOES NOT test for the novel  Coronavirus (2019 nCoV)    Coronavirus HKU1 NOT DETECTED NOT DETECTED   Coronavirus NL63 NOT DETECTED NOT DETECTED   Coronavirus OC43 NOT DETECTED NOT DETECTED   Metapneumovirus NOT DETECTED NOT DETECTED   Rhinovirus / Enterovirus NOT DETECTED NOT DETECTED   Influenza A NOT DETECTED NOT DETECTED   Influenza B NOT DETECTED NOT DETECTED   Parainfluenza Virus 1 NOT DETECTED NOT DETECTED   Parainfluenza Virus 2 NOT DETECTED NOT DETECTED   Parainfluenza Virus 3 NOT DETECTED NOT DETECTED   Parainfluenza Virus 4 NOT DETECTED NOT DETECTED   Respiratory Syncytial Virus NOT DETECTED NOT DETECTED   Bordetella pertussis NOT DETECTED NOT DETECTED   Bordetella Parapertussis NOT DETECTED NOT DETECTED   Chlamydophila pneumoniae NOT DETECTED NOT DETECTED   Mycoplasma pneumoniae NOT DETECTED NOT DETECTED    Comment: Performed at [facility], [address] [place], [place] [address]  CK     Status: Abnormal   Collection Time: 07/03/22  8:00 AM  Result Value Ref Range   Total CK 526 (H) 49 - 397 U/L    Comment: Performed at [facility], [address], [place], [place] [address]  Magnesium     Status: Abnormal   Collection Time: 07/03/22  8:00 AM  Result Value Ref Range   Magnesium 1.5 (L) 1.7 - 2.4 mg/dL    Comment: Performed at [facility], [address], [place], [place] [address]  Comprehensive metabolic panel     Status:  Abnormal   Collection Time: 07/03/22  8:00 AM  Result Value Ref Range   Sodium 139 135 - 145 mmol/L   Potassium 4.0 3.5 - 5.1 mmol/L   Chloride 103 98 - 111 mmol/L   CO2 25 22 - 32 mmol/L   Glucose, Bld 98 70 - 99 mg/dL    Comment: Glucose reference range applies only to samples taken after fasting for at least 8 hours.   BUN 19 8 - 23 mg/dL   Creatinine, Ser 8.41 (H) 0.61 - 1.24 mg/dL   Calcium 8.1 (L) 8.9 - 10.3 mg/dL   Total Protein 5.8 (L) 6.5 - 8.1 g/dL   Albumin 3.2 (L) 3.5 - 5.0 g/dL   AST 39 15 - 41 U/L   ALT 16 0 - 44 U/L   Alkaline Phosphatase 69 38 - 126 U/L   Total Bilirubin 0.9 0.3 - 1.2 mg/dL   GFR, Estimated 50 (L) >60 mL/min    Comment: (NOTE) Calculated using the CKD-EPI Creatinine Equation (2021)    Anion gap 11 5 - 15    Comment: Performed at [facility], [address], [place], [place] [address]  CBC with Differential     Status: Abnormal   Collection Time: 07/03/22  8:00 AM  Result Value Ref Range   WBC 13.2 (H) 4.0 - 10.5 K/uL   RBC 4.35 4.22 - 5.81 MIL/uL   Hemoglobin 12.8 (L) 13.0 - 17.0 g/dL   HCT 01.6 01.0 - 93.2 %   MCV 90.8 80.0 - 100.0 fL   MCH 29.4 26.0 - 34.0 pg   MCHC 32.4 30.0 - 36.0 g/dL   RDW 35.5 73.2 - 20.2 %   Platelets 142 (L) 150 - 400 K/uL   nRBC 0.0 0.0 - 0.2 %   Neutrophils Relative % 93 %   Neutro Abs 12.2 (H) 1.7 - 7.7 K/uL   Lymphocytes Relative 3 %   Lymphs Abs 0.4 (L) 0.7 - 4.0 K/uL   Monocytes Relative 4 %   Monocytes  Absolute 0.5 0.1 - 1.0 K/uL   Eosinophils Relative 0 %   Eosinophils Absolute 0.0 0.0 - 0.5 K/uL   Basophils Relative 0 %   Basophils Absolute 0.0 0.0 - 0.1 K/uL   Immature Granulocytes 0 %   Abs Immature Granulocytes 0.03 0.00 - 0.07 K/uL    Comment: Performed at [facility], [address], [place], [place] [address]  Protime-INR     Status: None   Collection Time: 07/03/22  8:00 AM  Result Value Ref Range   Prothrombin Time 13.1 11.4 - 15.2 seconds   INR 1.0 0.8 - 1.2    Comment: (NOTE) INR goal varies based on device and disease states. Performed at [facility], [address] [place], [place] [address]   APTT     Status: None   Collection Time: 07/03/22  8:00 AM  Result Value Ref Range   aPTT 27 24 - 36 seconds    Comment: Performed at [facility], [address] [place], [place] [address]  Brain natriuretic peptide     Status: None   Collection Time: 07/03/22  8:00 AM  Result Value Ref Range   B Natriuretic Peptide 25.1 0.0 - 100.0 pg/mL    Comment: Performed at [facility], [address], [place], [place] [address]  Troponin I (High Sensitivity)     Status: None   Collection Time: 07/03/22  8:00 AM  Result  Value Ref Range   Troponin I (High Sensitivity) 9 <18 ng/L    Comment: (NOTE) Elevated high sensitivity troponin I (hsTnI) values and significant  changes across serial measurements may suggest ACS but many other  chronic and acute conditions are known to elevate hsTnI results.  Refer to the "Links" section for chest pain algorithms and additional  guidance. Performed at [facility], [address] [place], [place] [address]   I-Stat venous blood gas, ED ([facility])     Status: Abnormal   Collection Time: 07/03/22  8:23 AM  Result Value Ref Range   pH, Ven 7.366 7.25 - 7.43   pCO2, Ven 43.8 (L) 44 - 60 mmHg   pO2, Ven 49 (H) 32 - 45 mmHg   Bicarbonate 25.1 20.0 - 28.0 mmol/L   TCO2 26 22 - 32 mmol/L   O2 Saturation 83 %    Acid-base deficit 1.0 0.0 - 2.0 mmol/L   Sodium 141 135 - 145 mmol/L   Potassium 3.9 3.5 - 5.1 mmol/L   Calcium, Ion 1.08 (L) 1.15 - 1.40 mmol/L   HCT 41.0 39.0 - 52.0 %   Hemoglobin 13.9 13.0 - 17.0 g/dL   Sample type VENOUS   MRSA Next Gen by PCR, Nasal     Status: None   Collection Time: 07/03/22  9:40 AM   Specimen: Nasal Mucosa; Nasal Swab  Result Value Ref Range   MRSA by PCR Next Gen NOT DETECTED NOT DETECTED    Comment: (NOTE) The GeneXpert MRSA Assay (FDA approved for NASAL specimens only), is one component of a comprehensive MRSA colonization surveillance program. It is not intended to diagnose MRSA infection nor to guide or monitor treatment for MRSA infections. Test performance is not FDA approved in patients less than 72 years old. Performed at [facility], [address] [place], [place] [address]   Blood Culture (routine x 2)     Status: None (Preliminary result)   Collection Time: 07/03/22 11:48 AM   Specimen: BLOOD  Result Value Ref Range   Specimen Description BLOOD RIGHT ANTECUBITAL    Special Requests      BOTTLES DRAWN AEROBIC AND ANAEROBIC Blood Culture adequate volume   Culture      NO GROWTH < 24 HOURS Performed at [facility] [place] [facility], [address] [place], [place] [address]    Report Status PENDING   TSH     Status: Abnormal   Collection Time: 07/03/22 11:48 AM  Result Value Ref Range   TSH 19.162 (H) 0.350 - 4.500 uIU/mL    Comment: Performed by a 3rd Generation assay with a functional sensitivity of <=0.01 uIU/mL. Performed at [facility], [address] [place], [place] [address]   Procalcitonin - Baseline     Status: None   Collection Time: 07/03/22 11:48 AM  Result Value Ref Range   Procalcitonin 0.39 ng/mL    Comment:        Interpretation: PCT (Procalcitonin) <= 0.5 ng/mL: Systemic infection (sepsis) is not likely. Local bacterial infection is possible. (NOTE)       Sepsis PCT Algorithm           Lower Respiratory  Tract                                      Infection PCT Algorithm    ----------------------------     ----------------------------  PCT < 0.25 ng/mL                PCT < 0.10 ng/mL          Strongly encourage             Strongly discourage   discontinuation of antibiotics    initiation of antibiotics    ----------------------------     -----------------------------       PCT 0.25 - 0.50 ng/mL            PCT 0.10 - 0.25 ng/mL               OR       >80% decrease in PCT            Discourage initiation of                                            antibiotics      Encourage discontinuation           of antibiotics    ----------------------------     -----------------------------         PCT >= 0.50 ng/mL              PCT 0.26 - 0.50 ng/mL               AND        <80% decrease in PCT             Encourage initiation of                                             antibiotics       Encourage continuation           of antibiotics    ----------------------------     -----------------------------        PCT >= 0.50 ng/mL                  PCT > 0.50 ng/mL               AND         increase in PCT                  Strongly encourage                                      initiation of antibiotics    Strongly encourage escalation           of antibiotics                                     -----------------------------                                           PCT <= 0.25 ng/mL  OR                                        > 80% decrease in PCT                                      Discontinue / Do not initiate                                             antibiotics  Performed at [facility], [place] [address], [place], [place] [address]   Urinalysis, w/ Reflex to Culture (Infection Suspected) -Urine, Clean Catch     Status: Abnormal   Collection Time: 07/03/22 12:15 PM  Result Value Ref Range   Specimen Source URINE, CLEAN CATCH     Color, Urine YELLOW YELLOW   APPearance CLEAR CLEAR   Specific Gravity, Urine >1.046 (H) 1.005 - 1.030   pH 6.0 5.0 - 8.0   Glucose, UA NEGATIVE NEGATIVE mg/dL   Hgb urine dipstick NEGATIVE NEGATIVE   Bilirubin Urine NEGATIVE NEGATIVE   Ketones, ur NEGATIVE NEGATIVE mg/dL   Protein, ur NEGATIVE NEGATIVE mg/dL   Nitrite NEGATIVE NEGATIVE   Leukocytes,Ua NEGATIVE NEGATIVE   RBC / HPF 0-5 0 - 5 RBC/hpf   WBC, UA 0-5 0 - 5 WBC/hpf    Comment:        Reflex urine culture not performed if WBC <=10, OR if Squamous epithelial cells >5. If Squamous epithelial cells >5 suggest recollection.    Bacteria, UA NONE SEEN NONE SEEN   Squamous Epithelial / HPF 0-5 0 - 5 /HPF   Mucus PRESENT     Comment: Performed at [facility], [place] [address], [place], [place] [address]  Lactic acid, plasma     Status: None   Collection Time: 07/03/22  2:15 PM  Result Value Ref Range   Lactic Acid, Venous 1.4 0.5 - 1.9 mmol/L    Comment: Performed at [place] [place] [address], [place], [place] [address]  Troponin I (High Sensitivity)     Status: Abnormal   Collection Time: 07/03/22  8:30 PM  Result Value Ref Range   Troponin I (High Sensitivity) 20 (H) <18 ng/L    Comment: (NOTE) Elevated high sensitivity troponin I (hsTnI) values and significant  changes across serial measurements may suggest ACS but many other  chronic and acute conditions are known to elevate hsTnI results.  Refer to the "Links" section for chest pain algorithms and additional  guidance. Performed at [facility], [place] [address], [place], [place] [address]   C-reactive protein     Status: Abnormal   Collection Time: 07/04/22  3:47 AM  Result Value Ref Range   CRP 1.7 (H) <1.0 mg/dL    Comment: Performed at [facility] [address], [place], [place] [address]  Sedimentation rate     Status: Abnormal   Collection Time: 07/04/22  3:47 AM  Result Value Ref Range   Sed Rate 17 (H) 0 - 16 mm/hr     Comment: Performed at [facility] [address] [place], [place] [address]  CBC  Status: Abnormal   Collection Time: 07/04/22  3:48 AM  Result Value Ref Range   WBC 8.4 4.0 - 10.5 K/uL   RBC 3.98 (L) 4.22 - 5.81 MIL/uL   Hemoglobin 11.8 (L) 13.0 - 17.0 g/dL   HCT 16.136.3 (L) 09.639.0 - 04.552.0 %   MCV 91.2 80.0 - 100.0 fL   MCH 29.6 26.0 - 34.0 pg   MCHC 32.5 30.0 - 36.0 g/dL   RDW 40.914.6 81.111.5 - 91.415.5 %   Platelets 132 (L) 150 - 400 K/uL    Comment: REPEATED TO VERIFY   nRBC 0.0 0.0 - 0.2 %    Comment: Performed at [facility], [address] [place], [place] [address]  Basic metabolic panel     Status: Abnormal   Collection Time: 07/04/22  3:48 AM  Result Value Ref Range   Sodium 138 135 - 145 mmol/L   Potassium 3.8 3.5 - 5.1 mmol/L   Chloride 111 98 - 111 mmol/L   CO2 23 22 - 32 mmol/L   Glucose, Bld 101 (H) 70 - 99 mg/dL    Comment: Glucose reference range applies only to samples taken after fasting for at least 8 hours.   BUN 14 8 - 23 mg/dL   Creatinine, Ser 6.211.24 0.61 - 1.24 mg/dL   Calcium 7.8 (L) 8.9 - 10.3 mg/dL   GFR, Estimated >30>60 >86>60 mL/min    Comment: (NOTE) Calculated using the CKD-EPI Creatinine Equation (2021)    Anion gap 4 (L) 5 - 15    Comment: Performed at [facility], [address] [place], [place] [address]  Procalcitonin     Status: None   Collection Time: 07/04/22  3:48 AM  Result Value Ref Range   Procalcitonin 0.33 ng/mL    Comment:        Interpretation: PCT (Procalcitonin) <= 0.5 ng/mL: Systemic infection (sepsis) is not likely. Local bacterial infection is possible. (NOTE)       Sepsis PCT Algorithm           Lower Respiratory Tract                                      Infection PCT Algorithm    ----------------------------     ----------------------------         PCT < 0.25 ng/mL                PCT < 0.10 ng/mL          Strongly encourage             Strongly discourage   discontinuation of antibiotics    initiation of  antibiotics    ----------------------------     -----------------------------       PCT 0.25 - 0.50 ng/mL            PCT 0.10 - 0.25 ng/mL               OR       >80% decrease in PCT            Discourage initiation of                                            antibiotics      Encourage discontinuation  of antibiotics    ----------------------------     -----------------------------         PCT >= 0.50 ng/mL              PCT 0.26 - 0.50 ng/mL               AND        <80% decrease in PCT             Encourage initiation of                                             antibiotics       Encourage continuation           of antibiotics    ----------------------------     -----------------------------        PCT >= 0.50 ng/mL                  PCT > 0.50 ng/mL               AND         increase in PCT                  Strongly encourage                                      initiation of antibiotics    Strongly encourage escalation           of antibiotics                                     -----------------------------                                           PCT <= 0.25 ng/mL                                                 OR                                        > 80% decrease in PCT                                      Discontinue / Do not initiate                                             antibiotics  Performed at [facility], [address] [place], [place] [address]    CT Angio Chest/Abd/Pel for Dissection W and/or Wo Contrast  Result Date: 07/03/2022 CLINICAL DATA:  Hypotension and shortness of breath. History of abdominal aortic aneurysm. Evaluate for rupture.  EXAM: CT ANGIOGRAPHY CHEST, ABDOMEN AND PELVIS TECHNIQUE: Non-contrast CT of the chest was initially obtained. Multidetector CT imaging through the chest, abdomen and pelvis was performed using the standard protocol during bolus administration of intravenous contrast. Multiplanar reconstructed images  and MIPs were obtained and reviewed to evaluate the vascular anatomy. RADIATION DOSE REDUCTION: This exam was performed according to the departmental dose-optimization program which includes automated exposure control, adjustment of the mA and/or kV according to patient size and/or use of iterative reconstruction technique. CONTRAST:  55mL OMNIPAQUE IOHEXOL 350 MG/ML SOLN COMPARISON:  Chest CTA 10/15/2018 FINDINGS: CTA CHEST FINDINGS Cardiovascular: Atherosclerosis of the aorta, great vessels and coronary arteries. No evidence of aortic aneurysm or dissection. No evidence of acute pulmonary embolism. Stable mild cardiomegaly. No significant pericardial effusion. Mediastinum/Nodes: There are no enlarged mediastinal, hilar or axillary lymph nodes. The thyroid gland, trachea and esophagus demonstrate no significant findings. Lungs/Pleura: No pleural effusion or pneumothorax. Stable peripheral scarring in the right lung adjacent to chronic rib fractures. Patchy ground-glass opacities in both lungs are similar to previous study and may be due to scarring or atelectasis. No confluent airspace disease. Musculoskeletal/Chest wall: No chest wall mass or suspicious osseous findings. Multiple old right-sided rib fractures. Review of the MIP images confirms the above findings. CTA ABDOMEN AND PELVIS FINDINGS VASCULAR Aorta: Diffuse aortic atherosclerosis with an infrarenal abdominal aortic aneurysm measuring up to 3.6 x 3.5 cm on image 201/7. Associated irregular mural thrombus. No aortic dissection or rupture identified. Celiac: Atherosclerosis. Patent without evidence of aneurysm, dissection or significant stenosis. SMA: Atherosclerosis. Patent without evidence of aneurysm, dissection or significant stenosis. Renals: Both renal arteries are patent without evidence of aneurysm, dissection or significant stenosis. Mild atherosclerosis. Small accessory left renal artery. IMA: Patent without evidence of aneurysm, dissection,  vasculitis or significant stenosis. Inflow: Possible short-segment dissection within the right common iliac artery which measures up to 1.9 cm in diameter. Veins: No obvious venous abnormality within the limitations of this arterial phase study. Review of the MIP images confirms the above findings. NON-VASCULAR FINDINGS Hepatobiliary: Mild contour irregularity of the liver, suggesting possible early cirrhosis. No focal lesions are identified. No evidence of gallstones, gallbladder wall thickening or biliary dilatation. Pancreas: Unremarkable. No pancreatic ductal dilatation or surrounding inflammatory changes. Spleen: Normal in size without focal abnormality. Adrenals/Urinary Tract: Both adrenal glands appear normal. The kidneys appear normal without evidence of urinary tract calculus, suspicious lesion or hydronephrosis. The bladder appears normal for its degree of distention. Stomach/Bowel: No enteric contrast administered. The stomach appears unremarkable for its degree of distension. No evidence of bowel wall thickening, distention or surrounding inflammatory change. The cecum is located anteriorly in the right mid abdomen. The appendix is not clearly visualized, although there is no pericecal inflammation to suggest appendicitis. Mild distal colonic diverticulosis noted. Lymphatic: There are no enlarged abdominal or pelvic lymph nodes. Reproductive: The prostate gland and seminal vesicles appear unremarkable. Other: Mildly prominent fat in both inguinal canals. No ascites, free air or focal extraluminal fluid collection. Musculoskeletal: No acute or significant osseous findings. Lower lumbar spondylosis with degenerative disc disease most advanced at L5-S1. Review of the MIP images confirms the above findings. IMPRESSION: 1. No acute vascular findings identified. 2. Infrarenal abdominal aortic aneurysm measuring up to 3.6 cm in diameter. Recommend follow-up ultrasound every 2 years. This recommendation follows  ACR consensus guidelines: White Paper of the ACR Incidental Findings Committee II on Vascular Findings. J Am Coll Radiol 2013; 10:789-794. 3. Possible short-segment dissection within the right  common iliac artery. 4. No evidence of acute pulmonary embolism or other acute chest findings. 5. Stable chronic right-sided rib fractures with associated scarring in the right lung. Patchy ground-glass opacities in both lungs are similar to previous study and may be due to scarring or atelectasis. 6. No acute findings in the abdomen or pelvis. Electronically Signed   By: Carey Bullocks M.D.   On: 07/03/2022 10:15   DG Chest 2 View  Result Date: 07/03/2022 CLINICAL DATA:  Sepsis.  Weakness.  Dizziness. EXAM: CHEST - 2 VIEW COMPARISON:  11/29/2018 FINDINGS: Heart size is normal. Mediastinal shadows are normal. Chronic scarring on the right subsequent to old healed rib fractures. No sign of acute infiltrate, edema, collapse or effusion. IMPRESSION: No active disease. Chronic scarring on the right subsequent to old healed rib fractures. Electronically Signed   By: Paulina Fusi M.D.   On: 07/03/2022 08:03    Pending Labs Unresulted Labs (From admission, onward)     Start     Ordered   07/04/22 0939  Magnesium  Add-on,   AD        07/04/22 0938   07/04/22 0939  Albumin  Add-on,   AD        07/04/22 0938   07/04/22 0932  T4, free  Add-on,   AD        07/04/22 0931   07/04/22 0932  T3, free  Add-on,   AD        07/04/22 0931   07/04/22 0500  Procalcitonin  Daily,   R      07/03/22 1124   07/03/22 0736  Blood Culture (routine x 2)  (Undifferentiated presentation (screening labs and basic nursing orders))  BLOOD CULTURE X 2,   STAT      07/03/22 0735            Vitals/Pain Today's Vitals   07/04/22 0800 07/04/22 0830 07/04/22 0900 07/04/22 0933  BP: (!) 132/92 (!) 114/49 (!) 131/58   Pulse: (!) 50 (!) 47 (!) 49   Resp: (!) Temp:    (!) 97.5 F (36.4 C)  TempSrc:    Oral  SpO2: 97% 98%  99%   Weight:      Height:      PainSc:        Isolation Precautions Droplet precaution  Medications Medications  vancomycin (VANCOCIN) IVPB 1000 mg/200 mL premix (has no administration in time range)  enoxaparin (LOVENOX) injection 40 mg (40 mg Subcutaneous Given 07/04/22 1043)  sodium chloride flush (NS) 0.9 % injection 3 mL (3 mLs Intravenous Given 07/04/22 1050)  acetaminophen (TYLENOL) tablet 650 mg (has no administration in time range)    Or  acetaminophen (TYLENOL) suppository 650 mg (has no administration in time range)  albuterol (PROVENTIL) (2.5 MG/3ML) 0.083% nebulizer solution 2.5 mg (has no administration in time range)  0.9 %  sodium chloride infusion ( Intravenous Restarted 07/03/22 2015)  HYDROcodone-acetaminophen (NORCO) 7.5-325 MG per tablet 1 tablet (1 tablet Oral Given 07/04/22 0213)  levothyroxine (SYNTHROID) tablet 300 mcg (300 mcg Oral Given 07/03/22 1346)  umeclidinium-vilanterol (ANORO ELLIPTA) 62.5-25 MCG/ACT 1 puff (1 puff Inhalation Given 07/04/22 1044)  aspirin EC tablet 81 mg (81 mg Oral Given 07/04/22 1043)  rosuvastatin (CRESTOR) tablet 5 mg (5 mg Oral Given 07/04/22 1043)  clopidogrel (PLAVIX) tablet 75 mg (75 mg Oral Given 07/04/22 1043)  cefTRIAXone (ROCEPHIN) 2 g in sodium chloride 0.9 % 100 mL IVPB (2 g Intravenous New  Bag/Given 07/04/22 1046)  ceFEPIme (MAXIPIME) 2 g in sodium chloride 0.9 % 100 mL IVPB (0 g Intravenous Stopped 07/03/22 1047)  sodium chloride 0.9 % bolus 1,000 mL (0 mLs Intravenous Stopped 07/03/22 1048)  vancomycin (VANCOCIN) IVPB 1000 mg/200 mL premix (0 mg Intravenous Stopped 07/03/22 1208)    Followed by  vancomycin (VANCOCIN) IVPB 1000 mg/200 mL premix (0 mg Intravenous Stopped 07/03/22 1202)  magnesium sulfate IVPB 2 g 50 mL (0 g Intravenous Stopped 07/03/22 1202)  sodium chloride 0.9 % bolus 1,000 mL (0 mLs Intravenous Stopped 07/03/22 1200)  iohexol (OMNIPAQUE) 350 MG/ML injection 75 mL (75 mLs Intravenous Contrast Given 07/03/22 0951)   calcium gluconate 2 g/ 100 mL sodium chloride IVPB (0 mg Intravenous Stopped 07/03/22 1636)  sodium chloride 0.9 % bolus 500 mL (0 mLs Intravenous Stopped 07/03/22 2016)  sodium chloride 0.9 % bolus 500 mL (0 mLs Intravenous Stopped 07/03/22 2016)    Mobility walks     Focused Assessments     R Recommendations: See Admitting Provider Note  Report given to:   Additional Notes:

## 2022-07-05 DIAGNOSIS — R6510 Systemic inflammatory response syndrome (SIRS) of non-infectious origin without acute organ dysfunction: Secondary | ICD-10-CM | POA: Diagnosis not present

## 2022-07-05 DIAGNOSIS — I959 Hypotension, unspecified: Secondary | ICD-10-CM | POA: Diagnosis not present

## 2022-07-05 DIAGNOSIS — D649 Anemia, unspecified: Secondary | ICD-10-CM | POA: Diagnosis not present

## 2022-07-05 DIAGNOSIS — N179 Acute kidney failure, unspecified: Secondary | ICD-10-CM | POA: Diagnosis not present

## 2022-07-05 LAB — PROCALCITONIN: Procalcitonin: 0.18 ng/mL

## 2022-07-05 MED ORDER — LOSARTAN POTASSIUM 50 MG PO TABS: 25.0000 mg | ORAL_TABLET | Freq: Every day | ORAL | 0 refills | Status: DC

## 2022-07-05 MED ORDER — AMOXICILLIN-POT CLAVULANATE 875-125 MG PO TABS: 1.0000 | ORAL_TABLET | Freq: Two times a day (BID) | ORAL | 0 refills | Status: DC

## 2022-07-05 MED ORDER — LEVOTHYROXINE SODIUM 150 MCG PO TABS: 150.0000 ug | ORAL_TABLET | Freq: Every day | ORAL | 2 refills | Status: AC

## 2022-07-05 NOTE — Care Management (Signed)
  Transition of Care [facility]) Screening Note   Patient Details  Name: Matthew Evans Date of Birth: 05-31-1951   Transition of Care [facility]) CM/SW Contact:    Carles Collet, RN Phone Number: 07/05/2022, 11:54 AM    Transition of Care Department [facility]) has reviewed patient and no TOC needs have been identified at this time. We will continue to monitor patient advancement through interdisciplinary progression rounds. If new patient transition needs arise, please place a TOC consult.

## 2022-07-05 NOTE — Discharge Summary (Addendum)
Physician Discharge Summary   Patient: Matthew Evans MRN: [id number] DOB: [birth year]  Admit date:     07/03/2022  Discharge date: 07/05/22  Discharge Physician: Oswald Hillock   PCP: Janie Morning, DO   Recommendations at discharge:   Follow-up vascular surgery as outpatient  Discharge Diagnoses: Principal Problem:   SIRS (systemic inflammatory response syndrome) (HCC) Active Problems:   Transient hypotension   AKI (acute kidney injury) ([place])   Normocytic anemia   Hypomagnesemia   Hypocalcemia   Hypothyroid   CAD (coronary artery disease)   COPD (chronic obstructive pulmonary disease) (HCC)   AAA (abdominal aortic aneurysm) ([place])  Resolved Problems:   * No resolved hospital problems. *  Hospital Course: 72 y.o. male with medical history significant of hypertension, hyperlipidemia, CAD, COPD, CVA, AAA who presented with complaints of chills.  He was woken out of his sleep at around 1 AM with shaking chills.  He states he had been in his normal state of health prior to this.  He reported having some shortness of breath with weakness.  He's been admitted with SIRS and hypotension and is being worked up for a suspected infection.   Assessment and Plan:  SIRS  Rigors  Generalized Weakness Patient present with reports of chills, tachypnea, and white blood cell count elevated up to 13.2.  Influenza, COVID-19, and RSV screening were negative.  RVP negative.  Lactic acid was mildly elevated up to 2.3.  CT chest/abdomen/pelvis without obvious infection. -Patient, UA was negative for nitrite and leukocytes. -Cultures till date shows no growth. -At this time patient's symptoms have improved, SIRS physiology has resolved -He was empirically started on vancomycin and ceftriaxone -Developed diarrhea yesterday which has resolved -CT abdomen/pelvis showed diverticulosis but no diverticulitis -Likely has mild episode of diverticulitis, will discharge on Augmentin for 5 more days.     Transient hypotension Initial blood pressures noted to be as low; improved with IV fluids -currently HCTZ, losartan, metoprolol, imdur were held in the hospital -I will cut down the dose of losartan to 25 mg daily -Will continue rest of the regimen as patient states that he has been taking his medications for a long time    Acute kidney injury Creatinine 1.51 with BUN 24.  Baseline creatinine previously noted to be around 1.1.  Urinalysis significant for elevated specific gravity without signs of infection. -improving, creatinine is up to 1.24   Normocytic anemia Continue to monitor    Hypomagnesemia Replace and follow   Hypocalcemia Correct for albumin, replace and follow   Hypothyroidism -Check TSH (elevated, suggesting he's either underdosed or non adherent) -Continue levothyroxine - he's supposed to take 300 mcg M W F, will clarify with him (wife takes care of his meds, she notes he was previously on 200 mcg daily, told this dose was too high, then switched to 300 mcg 3x/week -> equivalent of ~128 daily -> will increase to 150 mcg daily, they can cut pills at home in half - repeat labs in 6 weeks).   CAD Patient reported some complaints of chest discomfort with coughing.  High-sensitivity troponin was minimally elevated, suspect demand in setting of hypotension, not c/w ACS. -Continue aspirin, Plavix, and statin   COPD, without acute exacerbation On physical exam patient without significant wheezing or rhonchi appreciated at this time.  O2 saturation maintained on room air. 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  Patient is followed in the outpatient setting by Dr. Claiborne Billings.   Case had been discussed with vascular surgery who recommended outpatient follow-up -Continue outpatient follow-up -Patient to follow-up with vascular surgery as outpatient         Consultants:  Procedures performed:  Disposition: Home Diet recommendation:  Discharge Diet Orders (From admission, onward)     Start     Ordered   07/05/22 0000  Diet - low sodium heart healthy        07/05/22 1545           Regular diet DISCHARGE MEDICATION: Allergies as of 07/05/2022       Reactions   Niaspan [niacin] Rash   Septra [sulfamethoxazole-trimethoprim] Rash   Simcor [niacin-simvastatin Er] Rash   Zocor [simvastatin] Rash        Medication List     TAKE these medications    amoxicillin-clavulanate 875-125 MG tablet Commonly known as: AUGMENTIN Take 1 tablet by mouth 2 (two) times daily.   Anoro Ellipta 62.5-25 MCG/ACT Aepb Generic drug: umeclidinium-vilanterol Inhale 1 puff into the lungs daily. What changed: Another medication with the same name was removed. Continue taking this medication, and follow the directions you see here.   Aspirin Low Dose 81 MG tablet Generic drug: aspirin EC TAKE 1 TABLET (81 MG TOTAL) BY MOUTH DAILY. PATIENT NEEDS APPOINTMENT FOR ANY FUTURE REFILLS. What changed: See the new instructions.   clopidogrel 75 MG tablet Commonly known as: PLAVIX Take 1 tablet (75 mg total) by mouth daily. Schedule an appointment for further refills 2nd attempt   Fish Oil 1200 MG Caps Take 1,200 mg by mouth 2 (two) times daily.   hydrochlorothiazide 12.5 MG capsule Commonly known as: MICROZIDE TAKE 1 CAPSULE BY MOUTH EVERY OTHER DAY. CONTACT THE OFFICE TO SCHEDULE APPOINTMENT FOR REFILLS What changed: See the new instructions.   HYDROcodone-acetaminophen 7.5-325 MG tablet Commonly known as: NORCO Take 1 tablet by mouth every 6 (six) hours as needed for moderate pain or severe pain.   isosorbide mononitrate 60 MG 24 hr tablet Commonly known as:  IMDUR TAKE 1 AND 1/2 TABLET (90 MG TOTAL) BY MOUTH DAILY. What changed: See the new instructions.   levothyroxine 150 MCG tablet Commonly known as: SYNTHROID Take 1 tablet (150 mcg total) by mouth daily at 6 (six) AM. Start taking on: July 06, 2022 What changed:  medication strength how much to take when to take this additional instructions   losartan 50 MG tablet Commonly known as: COZAAR Take 0.5 tablets (25 mg total) by mouth daily. What changed: how much to take   metoprolol succinate 25 MG 24 hr tablet Commonly known as: TOPROL-XL TAKE 1/2 TABLET BY MOUTH EVERY DAY   ProAir RespiClick [id number] (90 Base) MCG/ACT Aepb Generic drug: Albuterol Sulfate Take 1 puff by mouth daily as needed (shortness of breath).   rosuvastatin 5 MG tablet Commonly known as: CRESTOR Take 5 mg by mouth daily.        Follow-up Information     Marty Heck, MD. Schedule an appointment as soon as possible for a visit.   Specialty: Vascular Surgery Contact information: [address] [place] [address] [phone]                Discharge Exam: [facility] Weights   07/03/22 0847  Weight: 92.5 kg  General-appears in no acute distress Heart-S1-S2, regular, no murmur auscultated Lungs-clear to auscultation bilaterally, no wheezing or crackles auscultated Abdomen-soft, nontender, no organomegaly Extremities-no edema in the lower extremities Neuro-alert, oriented x3, no focal deficit noted  Condition at discharge: good  The results of significant diagnostics from this hospitalization (including imaging, microbiology, ancillary and laboratory) are listed below for reference.   Imaging Studies: CT Angio Chest/Abd/Pel for Dissection W and/or Wo Contrast  Result Date: 07/03/2022 CLINICAL DATA:  Hypotension and shortness of breath. History of abdominal aortic aneurysm. Evaluate for rupture. EXAM: CT ANGIOGRAPHY CHEST, ABDOMEN AND PELVIS TECHNIQUE: Non-contrast CT of the chest  was initially obtained. Multidetector CT imaging through the chest, abdomen and pelvis was performed using the standard protocol during bolus administration of intravenous contrast. Multiplanar reconstructed images and MIPs were obtained and reviewed to evaluate the vascular anatomy. RADIATION DOSE REDUCTION: This exam was performed according to the departmental dose-optimization program which includes automated exposure control, adjustment of the mA and/or kV according to patient size and/or use of iterative reconstruction technique. CONTRAST:  28m OMNIPAQUE IOHEXOL 350 MG/ML SOLN COMPARISON:  Chest CTA 10/15/2018 FINDINGS: CTA CHEST FINDINGS Cardiovascular: Atherosclerosis of the aorta, great vessels and coronary arteries. No evidence of aortic aneurysm or dissection. No evidence of acute pulmonary embolism. Stable mild cardiomegaly. No significant pericardial effusion. Mediastinum/Nodes: There are no enlarged mediastinal, hilar or axillary lymph nodes. The thyroid gland, trachea and esophagus demonstrate no significant findings. Lungs/Pleura: No pleural effusion or pneumothorax. Stable peripheral scarring in the right lung adjacent to chronic rib fractures. Patchy ground-glass opacities in both lungs are similar to previous study and may be due to scarring or atelectasis. No confluent airspace disease. Musculoskeletal/Chest wall: No chest wall mass or suspicious osseous findings. Multiple old right-sided rib fractures. Review of the MIP images confirms the above findings. CTA ABDOMEN AND PELVIS FINDINGS VASCULAR Aorta: Diffuse aortic atherosclerosis with an infrarenal abdominal aortic aneurysm measuring up to 3.6 x 3.5 cm on image 201/7. Associated irregular mural thrombus. No aortic dissection or rupture identified. Celiac: Atherosclerosis. Patent without evidence of aneurysm, dissection or significant stenosis. SMA: Atherosclerosis. Patent without evidence of aneurysm, dissection or significant stenosis.  Renals: Both renal arteries are patent without evidence of aneurysm, dissection or significant stenosis. Mild atherosclerosis. Small accessory left renal artery. IMA: Patent without evidence of aneurysm, dissection, vasculitis or significant stenosis. Inflow: Possible short-segment dissection within the right common iliac artery which measures up to 1.9 cm in diameter. Veins: No obvious venous abnormality within the limitations of this arterial phase study. Review of the MIP images confirms the above findings. NON-VASCULAR FINDINGS Hepatobiliary: Mild contour irregularity of the liver, suggesting possible early cirrhosis. No focal lesions are identified. No evidence of gallstones, gallbladder wall thickening or biliary dilatation. Pancreas: Unremarkable. No pancreatic ductal dilatation or surrounding inflammatory changes. Spleen: Normal in size without focal abnormality. Adrenals/Urinary Tract: Both adrenal glands appear normal. The kidneys appear normal without evidence of urinary tract calculus, suspicious lesion or hydronephrosis. The bladder appears normal for its degree of distention. Stomach/Bowel: No enteric contrast administered. The stomach appears unremarkable for its degree of distension. No evidence of bowel wall thickening, distention or surrounding inflammatory change. The cecum is located anteriorly in the right mid abdomen. The appendix is not clearly visualized, although there is no pericecal inflammation to suggest appendicitis. Mild distal colonic diverticulosis noted. Lymphatic: There are no enlarged abdominal or pelvic lymph nodes. Reproductive: The prostate gland and seminal vesicles appear unremarkable. Other: Mildly prominent fat in both inguinal  canals. No ascites, free air or focal extraluminal fluid collection. Musculoskeletal: No acute or significant osseous findings. Lower lumbar spondylosis with degenerative disc disease most advanced at L5-S1. Review of the MIP images confirms the  above findings. IMPRESSION: 1. No acute vascular findings identified. 2. Infrarenal abdominal aortic aneurysm measuring up to 3.6 cm in diameter. Recommend follow-up ultrasound every 2 years. This recommendation follows ACR consensus guidelines: White Paper of the ACR Incidental Findings Committee II on Vascular Findings. J Am Coll Radiol 2013; 10:789-794. 3. Possible short-segment dissection within the right common iliac artery. 4. No evidence of acute pulmonary embolism or other acute chest findings. 5. Stable chronic right-sided rib fractures with associated scarring in the right lung. Patchy ground-glass opacities in both lungs are similar to previous study and may be due to scarring or atelectasis. 6. No acute findings in the abdomen or pelvis. Electronically Signed   By: Richardean Sale M.D.   On: 07/03/2022 10:15   DG Chest 2 View  Result Date: 07/03/2022 CLINICAL DATA:  Sepsis.  Weakness.  Dizziness. EXAM: CHEST - 2 VIEW COMPARISON:  11/29/2018 FINDINGS: Heart size is normal. Mediastinal shadows are normal. Chronic scarring on the right subsequent to old healed rib fractures. No sign of acute infiltrate, edema, collapse or effusion. IMPRESSION: No active disease. Chronic scarring on the right subsequent to old healed rib fractures. Electronically Signed   By: Nelson Chimes M.D.   On: 07/03/2022 08:03    Microbiology: Results for orders placed or performed during the hospital encounter of 07/03/22  Resp panel by RT-PCR (RSV, Flu A&B, Covid) Anterior Nasal Swab     Status: None   Collection Time: 07/03/22  7:38 AM   Specimen: Anterior Nasal Swab  Result Value Ref Range Status   SARS Coronavirus 2 by RT PCR NEGATIVE NEGATIVE Final    Comment: (NOTE) SARS-CoV-2 target nucleic acids are NOT DETECTED.  The SARS-CoV-2 RNA is generally detectable in upper respiratory specimens during the acute phase of infection. The lowest concentration of SARS-CoV-2 viral copies this assay can detect is 138  copies/mL. A negative result does not preclude SARS-Cov-2 infection and should not be used as the sole basis for treatment or other patient management decisions. A negative result may occur with  improper specimen collection/handling, submission of specimen other than nasopharyngeal swab, presence of viral mutation(s) within the areas targeted by this assay, and inadequate number of viral copies(<138 copies/mL). A negative result must be combined with clinical observations, patient history, and epidemiological information. The expected result is Negative.  Fact Sheet for Patients:  EntrepreneurPulse.com.au  Fact Sheet for Healthcare Providers:  IncredibleEmployment.be  This test is no t yet approved or cleared by the [place] FDA and  has been authorized for detection and/or diagnosis of SARS-CoV-2 by FDA under an Emergency Use Authorization (EUA). This EUA will remain  in effect (meaning this test can be used) for the duration of the COVID-19 declaration under Section 564(b)(1) of the Act, 21 U.S.C.section 360bbb-3(b)(1), unless the authorization is terminated  or revoked sooner.       Influenza A by PCR NEGATIVE NEGATIVE Final   Influenza B by PCR NEGATIVE NEGATIVE Final    Comment: (NOTE) The Xpert Xpress SARS-CoV-2/FLU/RSV plus assay is intended as an aid in the diagnosis of influenza from Nasopharyngeal swab specimens and should not be used as a sole basis for treatment. Nasal washings and aspirates are unacceptable for Xpert Xpress SARS-CoV-2/FLU/RSV testing.  Fact Sheet for Patients: EntrepreneurPulse.com.au  Fact Sheet  for Healthcare Providers: IncredibleEmployment.be  This test is not yet approved or cleared by the [place] and has been authorized for detection and/or diagnosis of SARS-CoV-2 by FDA under an Emergency Use Authorization (EUA). This EUA will remain in effect (meaning  this test can be used) for the duration of the COVID-19 declaration under Section 564(b)(1) of the Act, 21 U.S.C. section 360bbb-3(b)(1), unless the authorization is terminated or revoked.     Resp Syncytial Virus by PCR NEGATIVE NEGATIVE Final    Comment: (NOTE) Fact Sheet for Patients: EntrepreneurPulse.com.au  Fact Sheet for Healthcare Providers: IncredibleEmployment.be  This test is not yet approved or cleared by the [place] FDA and has been authorized for detection and/or diagnosis of SARS-CoV-2 by FDA under an Emergency Use Authorization (EUA). This EUA will remain in effect (meaning this test can be used) for the duration of the COVID-19 declaration under Section 564(b)(1) of the Act, 21 U.S.C. section 360bbb-3(b)(1), unless the authorization is terminated or revoked.  Performed at [facility], [place] [address] [place], [place] [address]   Respiratory (~20 pathogens) panel by PCR     Status: None   Collection Time: 07/03/22  7:38 AM   Specimen: Nasopharyngeal Swab; Respiratory  Result Value Ref Range Status   Adenovirus NOT DETECTED NOT DETECTED Final   Coronavirus 229E NOT DETECTED NOT DETECTED Final    Comment: (NOTE) The Coronavirus on the Respiratory Panel, DOES NOT test for the novel  Coronavirus (2019 nCoV)    Coronavirus HKU1 NOT DETECTED NOT DETECTED Final   Coronavirus NL63 NOT DETECTED NOT DETECTED Final   Coronavirus OC43 NOT DETECTED NOT DETECTED Final   Metapneumovirus NOT DETECTED NOT DETECTED Final   Rhinovirus / Enterovirus NOT DETECTED NOT DETECTED Final   Influenza A NOT DETECTED NOT DETECTED Final   Influenza B NOT DETECTED NOT DETECTED Final   Parainfluenza Virus 1 NOT DETECTED NOT DETECTED Final   Parainfluenza Virus 2 NOT DETECTED NOT DETECTED Final   Parainfluenza Virus 3 NOT DETECTED NOT DETECTED Final   Parainfluenza Virus 4 NOT DETECTED NOT DETECTED Final   Respiratory Syncytial Virus NOT  DETECTED NOT DETECTED Final   Bordetella pertussis NOT DETECTED NOT DETECTED Final   Bordetella Parapertussis NOT DETECTED NOT DETECTED Final   Chlamydophila pneumoniae NOT DETECTED NOT DETECTED Final   Mycoplasma pneumoniae NOT DETECTED NOT DETECTED Final    Comment: Performed at [facility], [place]. [address], [place], [place] [address]  MRSA Next Gen by PCR, Nasal     Status: None   Collection Time: 07/03/22  9:40 AM   Specimen: Nasal Mucosa; Nasal Swab  Result Value Ref Range Status   MRSA by PCR Next Gen NOT DETECTED NOT DETECTED Final    Comment: (NOTE) The GeneXpert MRSA Assay (FDA approved for NASAL specimens only), is one component of a comprehensive MRSA colonization surveillance program. It is not intended to diagnose MRSA infection nor to guide or monitor treatment for MRSA infections. Test performance is not FDA approved in patients less than 29 years old. Performed at [facility], [place] [address], [place],  [address]   Blood Culture (routine x 2)     Status: None (Preliminary result)   Collection Time: 07/03/22 11:48 AM   Specimen: BLOOD  Result Value Ref Range Status   Specimen Description BLOOD RIGHT ANTECUBITAL  Final   Special Requests   Final    BOTTLES DRAWN AEROBIC AND ANAEROBIC Blood Culture adequate volume  Culture   Final    NO GROWTH 2 DAYS Performed at [facility], [place] [address], [place], [place] [address]    Report Status PENDING  Incomplete  Blood Culture (routine x 2)     Status: None (Preliminary result)   Collection Time: 07/04/22  3:48 AM   Specimen: BLOOD  Result Value Ref Range Status   Specimen Description BLOOD RIGHT ANTECUBITAL  Final   Special Requests   Final    BOTTLES DRAWN AEROBIC AND ANAEROBIC Blood Culture results may not be optimal due to an excessive volume of blood received in culture bottles   Culture   Final    NO GROWTH 1 DAY Performed at [facility], [place] [address], [place],   [address]    Report Status PENDING  Incomplete    Labs: CBC: Recent Labs  Lab 07/03/22 0800 07/03/22 0823 07/04/22 0348  WBC 13.2*  --  8.4  NEUTROABS 12.2*  --   --   HGB 12.8* 13.9 11.8*  HCT 39.5 41.0 36.3*  MCV 90.8  --  91.2  PLT 142*  --  [id number]*   Basic Metabolic Panel: Recent Labs  Lab 07/03/22 0800 07/03/22 0823 07/04/22 0348 07/04/22 1036  NA 139 141 138  --   K 4.0 3.9 3.8  --   CL 103  --  111  --   CO2 25  --  23  --   GLUCOSE 98  --  101*  --   BUN 19  --  14  --   CREATININE 1.49*  --  1.24  --   CALCIUM 8.1*  --  7.8*  --   MG 1.5*  --   --  1.9   Liver Function Tests: Recent Labs  Lab 07/03/22 0800 07/04/22 1036  AST 39  --   ALT 16  --   ALKPHOS 69  --   BILITOT 0.9  --   PROT 5.8*  --   ALBUMIN 3.2* 2.8*   CBG: No results for input(s): "GLUCAP" in the last 168 hours.  Discharge time spent: greater than 30 minutes.  Signed: Oswald Hillock, MD [facility] 07/05/2022

## 2022-07-06 LAB — T3, FREE: T3, Free: 1.1 pg/mL — ABNORMAL LOW (ref 2.0–4.4)

## 2022-07-07 ENCOUNTER — Other Ambulatory Visit: Payer: Self-pay | Admitting: Cardiovascular Disease

## 2022-07-08 LAB — CULTURE, BLOOD (ROUTINE X 2)
Culture: NO GROWTH
Special Requests: ADEQUATE

## 2022-07-09 LAB — CULTURE, BLOOD (ROUTINE X 2): Culture: NO GROWTH

## 2022-07-11 ENCOUNTER — Encounter: Payer: Self-pay | Admitting: Vascular Surgery

## 2022-07-11 ENCOUNTER — Ambulatory Visit (INDEPENDENT_AMBULATORY_CARE_PROVIDER_SITE_OTHER): Payer: BC Managed Care – PPO | Admitting: Vascular Surgery

## 2022-07-11 VITALS — BP 147/64 | HR 50 | Temp 98.6°F | Resp 16 | Ht 68.0 in | Wt 208.0 lb

## 2022-07-11 DIAGNOSIS — I7772 Dissection of iliac artery: Secondary | ICD-10-CM | POA: Diagnosis not present

## 2022-07-11 DIAGNOSIS — I7143 Infrarenal abdominal aortic aneurysm, without rupture: Secondary | ICD-10-CM

## 2022-07-11 NOTE — Progress Notes (Signed)
Patient name: Matthew Evans MRN: [id number] DOB: 04/04/1951 Sex: male  REASON FOR CONSULT: 3.6 cm AAA, right common iliac artery dissection  HPI: Matthew Evans is a 72 y.o. male, with multiple comorbidities including coronary artery disease with history of MI, COPD, hypertension, hyperlipidemia presents for evaluation of abdominal aortic aneurysm and right common iliac artery dissection.  Patient was in the ED recently with chills and there was underlying concern for sepsis.  He had a CTA chest abdomen pelvis on 07/03/2022 showed a 3.6 cm AAA with short segment dissection in the right common iliac artery.  Patient does endorse some cramping at night when he sleeping in his lower extremities.  He states his AAA is followed by Dr. Claiborne Billings with cardiology.  This last measured 3.3 cm in 2019.  Past Medical History:  Diagnosis Date   AAA (abdominal aortic aneurysm) ([place])    mild; doppler 08/29/12-3.4x3.7   Arthritis    "hands, back" (09/28/2016)   CAD (coronary artery disease)    echo 07/26/12-EF 55-60%; myoview 07/26/12-low normal wall motion with mild apical hypokinesis as well a septal wall motion consistent with LBBB   Carotid artery occlusion    Chronic lower back pain    Cutaneous lupus erythematosus    [name], [name]   Dementia ([place])    "moderate; dx'd in ~ 2011" (09/28/2016)   Dermatitis    Emphysema    GERD (gastroesophageal reflux disease)    H/O hiatal hernia    Headache(784.0)    History of palpitations    HTN (hypertension)    Hyperlipidemia    Hypothyroidism    HX OF RADIOACTIVE IODINE    Memory loss    d/t mini stroke   Mini stroke ~ 2011   Myocardial infarction ([place]) 09/2007   PCI to RCA   Shortness of breath    WITH EXERTION   Sleep apnea    sleep study 12/14/06 ahi 16.89, during rem 12.3, supine ahi 57.49; cpap 02/22/07-c-flex of 3 at 13cm h2o; PT STATES HE DOES NOT USE HIS CPAP EVERY NIGHT   Sleep  apnea    "aien't worn mask in 3 years" (09/28/2016)   Stroke ([place])    PT STATES NEUROLOGIST TOLD HIM HE HAD HAD MINI STROKE   Thyroid disease    hypothyroidism    Past Surgical History:  Procedure Laterality Date   balloon atherotomy     LAD   BICEPS TENDON REPAIR Right 09/25/2016   "cleaned out spurs and arthritis" (09/28/2016)   CARDIAC CATHETERIZATION  08/11/11   EF50-55%, small AAA, no signifiant stent restenosis in RCA , no restenosis of PTCA of LAD, medical therapy   CARDIAC CATHETERIZATION  09/27/06   cutting balloon arthrotomy of LAD stenosis with dilatation of a 3.5x36mm cutting balloon   CARDIAC CATHETERIZATION  01/07/09   50-60% progressive dz beyond the stented segment in the RCA , LAD intervention remained patent   CORONARY ANGIOPLASTY WITH STENT PLACEMENT  09/24/06   PTCA/stent of RCA with a 3.5x49mm Taxus DES post dilated to 3.10mm   CORONARY ANGIOPLASTY WITH STENT PLACEMENT Right 09/2007   ESOPHAGEAL MANOMETRY N/A 08/04/2013   Procedure: ESOPHAGEAL MANOMETRY (EM);  Surgeon: Beryle Beams, MD;  Location: [facility];  Service: Endoscopy;  Laterality: N/A;   HERNIA REPAIR     HIATAL HERNIA REPAIR  2011?   LEFT HEART CATH AND CORONARY ANGIOGRAPHY N/A 10/02/2016  Procedure: Left Heart Cath and Coronary Angiography;  Surgeon: Marykay Lex, MD;  Location: [facility] INVASIVE CV LAB;  Service: Cardiovascular;  Laterality: N/A;   LEFT HEART CATHETERIZATION WITH CORONARY ANGIOGRAM N/A 08/11/2011   Procedure: LEFT HEART CATHETERIZATION WITH CORONARY ANGIOGRAM;  Surgeon: Lennette Bihari, MD;  Location: [facility] CATH LAB;  Service: Cardiovascular;  Laterality: N/A;   SHOULDER SURGERY Right     Family History  Problem Relation Age of Onset   Emphysema Mother    Heart disease Father    Mesothelioma Father    Heart attack Father     SOCIAL HISTORY: Social History   Socioeconomic History   Marital status: Married    Spouse name: Not on file   Number of children: Not on file   Years of  education: Not on file   Highest education level: Not on file  Occupational History   Occupation: retired Publishing rights manager: [employer]  Tobacco Use   Smoking status: Former    Packs/day: 2.00    Years: 45.00    Total pack years: 90.00    Types: Cigarettes    Quit date: 02/04/2010    Years since quitting: 12.4   Smokeless tobacco: Never  Vaping Use   Vaping Use: Never used  Substance and Sexual Activity   Alcohol use: No    Comment: QUIT 1994   Drug use: No   Sexual activity: Not on file  Other Topics Concern   Not on file  Social History Narrative   Not on file   Social Determinants of Health   Financial Resource Strain: Not on file  Food Insecurity: Not on file  Transportation Needs: Not on file  Physical Activity: Not on file  Stress: Not on file  Social Connections: Not on file  Intimate Partner Violence: Not on file    Allergies  Allergen Reactions   Niaspan [Niacin] Rash   Septra [Sulfamethoxazole-Trimethoprim] Rash   Simcor [Niacin-Simvastatin Er] Rash   Zocor [Simvastatin] Rash    Current Outpatient Medications  Medication Sig Dispense Refill   Albuterol Sulfate (PROAIR RESPICLICK) 108 (90 Base) MCG/ACT AEPB Take 1 puff by mouth daily as needed (shortness of breath).     aspirin EC (ASPIRIN LOW DOSE) 81 MG tablet TAKE 1 TABLET (81 MG TOTAL) BY MOUTH DAILY. PATIENT NEEDS APPOINTMENT FOR ANY FUTURE REFILLS. (Patient taking differently: 81 mg daily.) 15 tablet 0   clopidogrel (PLAVIX) 75 MG tablet Take 1 tablet (75 mg total) by mouth daily. Schedule an appointment for further refills 2nd attempt 30 tablet 1   hydrochlorothiazide (MICROZIDE) 12.5 MG capsule TAKE 1 CAPSULE BY MOUTH EVERY OTHER DAY. CONTACT THE OFFICE TO SCHEDULE APPOINTMENT FOR REFILLS (Patient taking differently: Take 12.5 mg by mouth See admin instructions. Takes mon, wed and fri) 45 capsule 1   HYDROcodone-acetaminophen (NORCO) 7.5-325 MG tablet Take 1 tablet by mouth every 6 (six) hours  as needed for moderate pain or severe pain.      isosorbide mononitrate (IMDUR) 60 MG 24 hr tablet TAKE 1 AND 1/2 TABLET (90 MG TOTAL) BY MOUTH DAILY. (Patient taking differently: Take 90 mg by mouth daily.) 45 tablet 0   levothyroxine (SYNTHROID) 150 MCG tablet Take 1 tablet (150 mcg total) by mouth daily at 6 (six) AM. 30 tablet 2   losartan (COZAAR) 50 MG tablet Take 1 tablet (50 mg total) by mouth daily. PATIENT MUST KEEP UP COMING APPOINTMENT FOR FUTURE REFILLS 90 tablet 0   metoprolol succinate (  TOPROL-XL) 25 MG 24 hr tablet TAKE 1/2 TABLET BY MOUTH EVERY DAY 45 tablet 3   Omega-3 Fatty Acids (FISH OIL) 1200 MG CAPS Take 1,200 mg by mouth 2 (two) times daily.      rosuvastatin (CRESTOR) 5 MG tablet Take 5 mg by mouth daily.     umeclidinium-vilanterol (ANORO ELLIPTA) 62.5-25 MCG/ACT AEPB Inhale 1 puff into the lungs daily. 14 each 11   amoxicillin-clavulanate (AUGMENTIN) 875-125 MG tablet Take 1 tablet by mouth 2 (two) times daily. (Patient not taking: Reported on 07/11/2022) 10 tablet 0   No current facility-administered medications for this visit.    REVIEW OF SYSTEMS:  [X]  denotes positive finding, [ ]  denotes negative finding Cardiac  Comments:  Chest pain or chest pressure:    Shortness of breath upon exertion:    Short of breath when lying flat:    Irregular heart rhythm:        Vascular    Pain in calf, thigh, or hip brought on by ambulation:    Pain in feet at night that wakes you up from your sleep:     Blood clot in your veins:    Leg swelling:         Pulmonary    Oxygen at home:    Productive cough:     Wheezing:         Neurologic    Sudden weakness in arms or legs:     Sudden numbness in arms or legs:     Sudden onset of difficulty speaking or slurred speech:    Temporary loss of vision in one eye:     Problems with dizziness:         Gastrointestinal    Blood in stool:     Vomited blood:         Genitourinary    Burning when urinating:     Blood in  urine:        Psychiatric    Major depression:         Hematologic    Bleeding problems:    Problems with blood clotting too easily:        Skin    Rashes or ulcers:        Constitutional    Fever or chills:      PHYSICAL EXAM: Vitals:   07/11/22 1507  BP: (!) 147/64  Pulse: (!) 50  Resp: 16  Temp: 98.6 F (37 C)  TempSrc: Temporal  SpO2: 98%  Weight: 208 lb (94.3 kg)  Height: 5\' 8"  (1.727 m)    GENERAL: The patient is a well-nourished male, in no acute distress. The vital signs are documented above. CARDIAC: There is a regular rate and rhythm.  VASCULAR:  Bilateral femoral pulses palpable Bilateral DP pulses palpable PULMONARY: No respiratory distress. ABDOMEN: Soft and non-tender. MUSCULOSKELETAL: There are no major deformities or cyanosis. NEUROLOGIC: No focal weakness or paresthesias are detected. SKIN: There are no ulcers or rashes noted. PSYCHIATRIC: The patient has a normal affect.  DATA:   CTA reviewed from 07/03/2022 and infrarenal abdominal aortic aneurysm measures 3.6 cm with evidence of a chronic right common iliac dissection with flow distally.  Assessment/Plan:  72 year old male presents for evaluation of abdominal aortic aneurysm approximately 3.6 cm as well as a right common iliac artery dissection noted on recent CTA in the ED when he presented with chills and concern for sepsis.  I discussed his aneurysm measures 3.6 cm in the abdominal aorta.  I  discussed no indications for surgical intervention.  Current guidelines are to repair AAA at greater than 5.5 cm in men.  His last AAA duplex in the computer was in 2018 when it measured 3.3 cm and there has been minimal growth over nearly 4 years.  I discussed his right iliac dissection appears chronic as there is calcium in the septum.  He has a palpable femoral pulse as well as a palpable AT and posterior tibial pulse in the right foot and I do not feel this is flow-limiting.  I discussed that he needs to  have annual surveillance of his abdominal aortic aneurysm as well as the right common iliac dissection to ensure it does not get aneurysmal degeneration.  He states that Dr. Claiborne Billings with cardiology is following this and would prefer to keep his follow-up with him for now.  Discussed he can come back and see me if there has been interval growth.   Marty Heck, MD [facility] Office: [phone]

## 2022-07-12 ENCOUNTER — Other Ambulatory Visit: Payer: Self-pay | Admitting: Cardiovascular Disease

## 2022-08-09 ENCOUNTER — Other Ambulatory Visit: Payer: Self-pay | Admitting: Cardiovascular Disease

## 2022-09-05 ENCOUNTER — Ambulatory Visit (HOSPITAL_BASED_OUTPATIENT_CLINIC_OR_DEPARTMENT_OTHER): Payer: BC Managed Care – PPO | Admitting: Pulmonary Disease

## 2022-09-12 ENCOUNTER — Ambulatory Visit (INDEPENDENT_AMBULATORY_CARE_PROVIDER_SITE_OTHER): Payer: BC Managed Care – PPO | Admitting: Pulmonary Disease

## 2022-09-12 ENCOUNTER — Encounter (HOSPITAL_BASED_OUTPATIENT_CLINIC_OR_DEPARTMENT_OTHER): Payer: Self-pay | Admitting: Pulmonary Disease

## 2022-09-12 VITALS — BP 122/58 | HR 44 | Ht 68.0 in | Wt 206.0 lb

## 2022-09-12 DIAGNOSIS — J849 Interstitial pulmonary disease, unspecified: Secondary | ICD-10-CM | POA: Diagnosis not present

## 2022-09-12 DIAGNOSIS — J432 Centrilobular emphysema: Secondary | ICD-10-CM | POA: Diagnosis not present

## 2022-09-12 MED ORDER — ANORO ELLIPTA 62.5-25 MCG/ACT IN AEPB: 1.0000 | INHALATION_SPRAY | Freq: Every day | RESPIRATORY_TRACT | 5 refills | Status: DC

## 2022-09-12 NOTE — Progress Notes (Signed)
   Subjective:    Patient ID: Matthew Evans, male    DOB: Feb 07, 1951, 72 y.o.   MRN: [id number]  HPI  72  yo ex heavy smoker with Mild COPD  -postinflammatory scarring at bases, likely related to Covid infection 09/2020   He smoked 90 pack years before he quit in 2011   PMH : ischemic cardiomyopathy amyopathic dermatomyositis -Used to be on Plaquenil ( Dr Reche Dixon) ,was on dupixent 03/2021 Nissen fundoplication Infrarenal AAA 3.6 cm, right iliac artery dissection  36-month follow-up visit. Last office visit.  Schedule high-resolution CT chest, he did not have this done but he was hospitalized for fever, chills and hypotension felt to have infection -reviewed CT which showed unchanged scarring in the right lung with patchy groundglass opacities in both lungs unchanged from prior. He was found to have a infrarenal AAA and right iliac artery dissection, seen by vascular, no intervention recommended He still complains of shortness of breath when walking long distance, Anoro seems to help.    Significant tests/ events reviewed   PFT's 2014: FEV1 2.04 (68%), +15% increase with BD, no restriction, normal DLCO, +airtrapping.    08/2016  Spirometry -FEV1 at 68%, ratio 73, FVC 69%    CTA c/A/P  06/2022 >>  Stable chronic right-sided rib fractures with associated scarring in the right lung. Patchy ground-glass opacities in both lungs - unchanged   CTA 10/2018 Scattered areas of peripheral fibrosis and lower lobe bronchiectatic change.  CT angio 09/2016 nodular scarring right upper lobe 5 mm 05/2018 CT chest without contrast 6 mm nodule along right minor fissure, unchanged  Review of Systems neg for any significant sore throat, dysphagia, itching, sneezing, nasal congestion or excess/ purulent secretions, fever, chills, sweats, unintended wt loss, pleuritic or exertional cp, hempoptysis, orthopnea pnd or change in chronic leg swelling. Also denies presyncope, palpitations, heartburn, abdominal pain,  nausea, vomiting, diarrhea or change in bowel or urinary habits, dysuria,hematuria, rash, arthralgias, visual complaints, headache, numbness weakness or ataxia.     Objective:   Physical Exam  Gen. Pleasant, well-nourished, in no distress ENT - no thrush, no pallor/icterus,no post nasal drip Neck: No JVD, no thyromegaly, no carotid bruits Lungs: no use of accessory muscles, no dullness to percussion, clear without rales or rhonchi  Cardiovascular: Rhythm regular, heart sounds  normal, no murmurs or gallops, no peripheral edema Musculoskeletal: No deformities, no cyanosis or clubbing        Assessment & Plan:

## 2022-09-12 NOTE — Patient Instructions (Signed)
x schedule PFTs, next available  X High-resolution CT chest in 4 months at [facility]  x refills on Anoro x 5

## 2022-09-12 NOTE — Assessment & Plan Note (Signed)
No significant airway obstruction on last PFTs in 2018 but subjective benefit without Anoro has continued.

## 2022-09-12 NOTE — Assessment & Plan Note (Signed)
Some scarring in the right lung associated with rib fractures appears to be chronic and posttraumatic.  He also has bilateral patchy groundglass and concern as this may be related to CT ILD due to history of dermatomyositis. Will obtain high-resolution CT chest and PFTs to quantify lung function prior to next visit

## 2022-09-26 ENCOUNTER — Encounter (HOSPITAL_BASED_OUTPATIENT_CLINIC_OR_DEPARTMENT_OTHER): Payer: BC Managed Care – PPO

## 2022-09-29 ENCOUNTER — Other Ambulatory Visit: Payer: Self-pay | Admitting: Cardiovascular Disease

## 2022-10-03 ENCOUNTER — Other Ambulatory Visit: Payer: Self-pay | Admitting: Cardiovascular Disease

## 2022-10-24 ENCOUNTER — Encounter: Payer: Self-pay | Admitting: Cardiovascular Disease

## 2022-10-24 ENCOUNTER — Ambulatory Visit: Payer: BC Managed Care – PPO | Attending: Cardiovascular Disease | Admitting: Cardiovascular Disease

## 2022-10-24 DIAGNOSIS — E039 Hypothyroidism, unspecified: Secondary | ICD-10-CM

## 2022-10-24 DIAGNOSIS — I2510 Atherosclerotic heart disease of native coronary artery without angina pectoris: Secondary | ICD-10-CM

## 2022-10-24 DIAGNOSIS — I447 Left bundle-branch block, unspecified: Secondary | ICD-10-CM

## 2022-10-24 DIAGNOSIS — I7143 Infrarenal abdominal aortic aneurysm, without rupture: Secondary | ICD-10-CM

## 2022-10-24 DIAGNOSIS — E785 Hyperlipidemia, unspecified: Secondary | ICD-10-CM

## 2022-10-24 DIAGNOSIS — R001 Bradycardia, unspecified: Secondary | ICD-10-CM | POA: Diagnosis not present

## 2022-10-24 DIAGNOSIS — J432 Centrilobular emphysema: Secondary | ICD-10-CM

## 2022-10-24 MED ORDER — METOPROLOL SUCCINATE ER 25 MG PO TB24: 12.5000 mg | ORAL_TABLET | ORAL | 3 refills | Status: DC

## 2022-10-24 NOTE — Progress Notes (Signed)
Patient ID: Matthew Evans, male   DOB: Apr 29, 1951, 72 y.o.   MRN: [id number]       HPI: Matthew Evans, is a 72 y.o. male who presents to the office today for a 53 month cardiology evaluation.  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 He underwent staged cutting balloon to a high grade LAD stenosis which involved multiple branches and stenting was not done to reduce potential for jailing of his multiple branches. In August 2010 he was found to have 50-60% stenosis beyond his RCA stent in the LAD intervention site remained patent. He has a documented abdominal aortic aneurysm and a followup evaluation with Doppler imaging  on 08/29/2012 was essentially unchanged from one year previously and showed a measurement of 3.4x3.7 cm.  Additional problems include COPD, hypertension, hypothyroidism, hyperlipidemia, GERD. A nuclear study in February 2014 remained low risk and demonstrated mild apical hypokinesis with low normal wall motion without ischemia. An echo Doppler study in February 2014 showed mild LVH with grade 1 diastolic dysfunction and normal systolic function. He did have aortic valve thickening without stenosis and mild left atrial dilatation. Had normal pulmonary pressures.  He has a history of palpitations which has been treated with low dose  beta blocker therapy.He has COPD. He recently has been on Anora in addition to when necessary albuterol.  He underwent a Nissen fundoplication hiatal hernia surgery by Matthew Evans in May, 2015.  Since that time, he has lost approximately 20 pounds.  In 2015 A follow-up abdominal aortic ultrasound his revealed 3.6 x 3.3 cm with a small amount of atherosclerosis visualized, which was not hemodynamically significant.  This was not significantly changed from one year previously.  He had an emergency room evaluation on 09/15/2014.  He had been working aggressively at home in his yard  intensely and overdid it.  He presented with presyncope and also had mild dyspnea.  He had noticed some mild dyspnea on exertion for the past several months.  In the emergency room he was noted to have an irregular heart rate but sinus rhythm with frequent PVCs.  PVCs were not conducted so his resting pulse radially was bradycardic.    On 09/30/2014 an echo Doppler showed an EF of 40-45% with diffuse hypokinesis and grade 2 diastolic dysfunction.  There was mitral annular calcification.  His left atrium was mildly dilated.  Right ventricle was normal.  A nuclear perfusion study done the same day was interpreted as low risk.  He has left bundle branch block.  There was a small mild fixed anterior defect consistent with soft tissue attenuation versus possible left bundle branch block abnormality.  There was no ischemia.  A follow-up abdominal ultrasound in June 2016 showed an aortic aneurysm of 3.7 x 3.5, which was not significantly changed.  At times, he has noticed rare episodes of chest pain but typically most of his symptoms are related to shortness of breath with activity.  He was supposed to be taking isosorbide 90 mg daily, but has only been taking 60 mg.  He now sees Matthew Evans for his pulmonary care.  Matthew Evans has been followed for primary care by Matthew Evans.  I had recently seen him prior to him undergoing shoulder surgery by Matthew Evans.which was done on 09/25/2016.  Prior to giving him  operative clearance I recommended that he undergo a preoperativean echo Doppler study. This was done on 09/01/2016 and showed  his echo EF had slightly improved and was now in the 45-50% range. He apparently tolerated his shoulder surgery well but after going home 2 days later developed increasing shortness of breath.  He presented to the hospital.  In the hospital, he again had another echo which showed an EF of 45-50%. He was referred for a nuclear stress test which raised concern for possible ischemia in the inferior  wall and probable scar in the anteroseptal wall.  As result, he underwent cardiac catheterization was done by Matthew Evans, which revealed a patent RCA stent and 30% LAD and circumflex narrowing.  Angiographically there was no obvious culprit lesion to explain his symptoms.  When I last saw him he denied any chest pain or shortness of breath.  He was walking daily up to a mile.  He has remote history of obstructive sleep apnea but had not use CPAP in over 3 years.  He stated that his sleep was poor and was wondering if he needs to reinstitute to to treatment.    He underwent a sleep study in 01/04/2017.  No significant sleep apnea was demonstrated with an overall AHI of 1.1.  AHI during REM sleep was 1.3.  There was minimum oxygen desaturation to 88%.  He had abnormal sleep architecture with absence of slow-wave sleep and prolonged latency to rems sleep.  It was felt that he had mild increased upper airway resistance syndrome without definitive obstructive sleep apnea.  Over the past several months.  He is sleeping better.  He is now seeing Matthew Evans for primary care.  I saw him in October 2018, at which time he remained  stable from a cardiac standpoint.    He was on Toprol-XL 37.5 mg daily, losartan 75 mg daily, isosorbide 60 mg in the morning and amlodipine 5 mg for blood pressure control.  He continued to be on dual antiplatelet therapy with aspirin and Plavix.  He also takes HCTZ 12.5 mg every other day.  He is on levothyroxine for hypothyroidism.  He takes an oral Ellipta for his COPD.   I saw him in September 2019.  At that time he had sinus bradycardia with a ventricular rate at 47 with left bundle branch block and Toprol was reduced.  Matthew Evans ultrasound in April 2019 revealed abdominal aorta measuring 3.3 cm which was not significantly changed compared to his prior examinations.  [name] in June 2019 showed a total cholesterol 122, LDL 73.  He developed COVID-19 infection on April 1 and was  hospitalized from April 5 through September 20, 2018 with significant Ona and bilateral patchy areas of airspace disease consistent with COVID-19 infection.  He was treated with Plaquenil x5 days, azithromycin x8 days and received Actemra on admission.  He felt markedly fatigued following his hospitalization.  He ultimately tested negative and apparently has a strong antibody response.  I saw him on March 03, 2019 at which time he denied any chest pain.   At times he noted a very sharp musculoskeletal like short-lived chest pain.  He has underlying COPD and continues to have some exertional dyspnea.  He had been recently evaluated Matthew Spearing, NP pulmonary division and he sees Matthew Evans for his primary pulmonary care.    He was seen in November 2020 after he had called the office and requested to be evaluated sooner due to his recurrent episodes of twinges in his chest.  These typically are nonexertional.  Over the last several days he had been using a leaf  blower which was strapped onto his back and he had pulldown straps over his chest and he was blowing significant amount of leaves.  He began to notice these twinges of chest pain which would occur typically while he was sitting and specifically were not exertionally precipitated.  At that time, I felt his chest pain was musculoskeletal in etiology.  I  saw him in September 2021 at which time he continued to feel well.  He experienced mild shortness of breath particularly if he over does it working on his yard and weed eating.  He had continue to be on long-term aspirin/Plavix but for some reason the pharmacy never renewed his medication and for the last 2 months he has been off clopidogrel.  He has continued to be on isosorbide 60 mg, losartan 50 mg, metoprolol succinate 25 mg and HCTZ 12.5 mg daily both for blood pressure and his CAD and was on rosuvastatin for hyperlipidemia and levothyroxine with his dose recently increased by his primary provider to  300 mcg daily.  He denied recurrent anginal symptoms.    I last saw him on August 25, 2020.  Since his prior evaluation he had otologic issues with recurrent rash in multiple sites including his groin, arms, back, and has undergone several dermatologic evaluations at [facility] dermatology.  He has an apparent appointment to see Matthew Evans at [facility] dermatology in the upcoming weeks.  He has been without anginal symptoms.  He had not been using CPAP therapy.  He believes he is sleeping well.  He is unaware of palpitations.  During that evaluation his blood pressure was stable on HCTZ 12.5 mg, losartan 50 mg, isosorbide 90 mg, metoprolol succinate 12.5 mg.  He was on rosuvastatin 5 mg and omega-3 fatty acid for mixed hyperlipidemia.  He was on levothyroxine 300 mcg for hypothyroidism.  He was tolerating DAPT with long-term clopidogrel and low-dose aspirin.  Since I last saw him, he has continued to be followed by Matthew Evans for his pulmonary disease.  He had previously smoked 90 pack years before quitting in 2011.  In January he presented to the ED with chills and there was concern for sepsis.  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  He subsequently was evaluated by Matthew Evans at [facility].  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    Presently, Matthew Evans feels well.  He is followed by Matthew Reichmann, Matthew Evans for primary care who will be checking laboratory.  He believes he is sleeping well off CPAP.  At times he experiences mild dizziness when he leans and bends forward.  He continues to be on DAPT with aspirin/Plavix.  He is on HCTZ 12.5 mg, isosorbide 90 mg, losartan 50 mg, and metoprolol succinate 12.5 mg daily.  He continues to be on fish oil and rosuvastatin 5 mg.  He is on Anora Ellipta for his mild COPD.  He presents for evaluation.   Past  Medical History:  Diagnosis Date   AAA (abdominal aortic aneurysm) (HCC)    mild; doppler 08/29/12-3.4x3.7   Arthritis    "hands, back" (09/28/2016)   CAD (coronary artery disease)    echo 07/26/12-EF 55-60%; myoview 07/26/12-low normal wall motion with mild apical hypokinesis as well a septal wall motion consistent with LBBB   Carotid artery occlusion    Chronic lower back  pain    Cutaneous lupus erythematosus    [name], [name]   Dementia (HCC)    "moderate; dx'd in ~ 2011" (09/28/2016)   Dermatitis    Emphysema    GERD (gastroesophageal reflux disease)    H/O hiatal hernia    Headache(784.0)    History of palpitations    HTN (hypertension)    Hyperlipidemia    Hypothyroidism    HX OF RADIOACTIVE IODINE    Memory loss    d/t mini stroke   Mini stroke ~ 2011   Myocardial infarction (HCC) 09/2007   PCI to RCA   Shortness of breath    WITH EXERTION   Sleep apnea    sleep study 12/14/06 ahi 16.89, during rem 12.3, supine ahi 57.49; cpap 02/22/07-c-flex of 3 at 13cm h2o; PT STATES HE DOES NOT USE HIS CPAP EVERY NIGHT   Sleep apnea    "aien't worn mask in 3 years" (09/28/2016)   Stroke (HCC)    PT STATES NEUROLOGIST TOLD HIM HE HAD HAD MINI STROKE   Thyroid disease    hypothyroidism    Past Surgical History:  Procedure Laterality Date   balloon atherotomy     LAD   BICEPS TENDON REPAIR Right 09/25/2016   "cleaned out spurs and arthritis" (09/28/2016)   CARDIAC CATHETERIZATION  08/11/11   EF50-55%, small AAA, no signifiant stent restenosis in RCA , no restenosis of PTCA of LAD, medical therapy   CARDIAC CATHETERIZATION  09/27/06   cutting balloon arthrotomy of LAD stenosis with dilatation of a 3.5x72mm cutting balloon   CARDIAC CATHETERIZATION  01/07/09   50-60% progressive dz beyond the stented segment in the RCA , LAD intervention remained patent   CORONARY ANGIOPLASTY WITH STENT PLACEMENT  09/24/06   PTCA/stent  of RCA with a 3.5x49mm Taxus DES post dilated to 3.36mm   CORONARY ANGIOPLASTY WITH STENT PLACEMENT Right 09/2007   ESOPHAGEAL MANOMETRY N/A 08/04/2013   Procedure: ESOPHAGEAL MANOMETRY (EM);  Surgeon: Theda Belfast, MD;  Location: [facility] ENDOSCOPY;  Service: Endoscopy;  Laterality: N/A;   HERNIA REPAIR     HIATAL HERNIA REPAIR  2011?   LEFT HEART CATH AND CORONARY ANGIOGRAPHY N/A 10/02/2016   Procedure: Left Heart Cath and Coronary Angiography;  Surgeon: Marykay Lex, MD;  Location: [facility] INVASIVE CV LAB;  Service: Cardiovascular;  Laterality: N/A;   LEFT HEART CATHETERIZATION WITH CORONARY ANGIOGRAM N/A 08/11/2011   Procedure: LEFT HEART CATHETERIZATION WITH CORONARY ANGIOGRAM;  Surgeon: Lennette Bihari, MD;  Location: [facility] CATH LAB;  Service: Cardiovascular;  Laterality: N/A;   SHOULDER SURGERY Right     Allergies  Allergen Reactions   Niaspan [Niacin] Rash   Septra [Sulfamethoxazole-Trimethoprim] Rash   Simcor [Niacin-Simvastatin Er] Rash   Zocor [Simvastatin] Rash    Current Outpatient Medications  Medication Sig Dispense Refill   Albuterol Sulfate (PROAIR RESPICLICK) 108 (90 Base) MCG/ACT AEPB Take 1 puff by mouth daily as needed (shortness of breath).     aspirin EC (ASPIRIN LOW DOSE) 81 MG tablet TAKE 1 TABLET (81 MG TOTAL) BY MOUTH DAILY. PATIENT NEEDS APPOINTMENT FOR ANY FUTURE REFILLS. (Patient taking differently: 81 mg daily.) 15 tablet 0   clopidogrel (PLAVIX) 75 MG tablet TAKE 1 TABLET (75 MG TOTAL) BY MOUTH DAILY. SCHEDULE AN APPOINTMENT FOR FURTHER REFILLS 2ND ATTEMPT 30 tablet 0   hydrochlorothiazide (MICROZIDE) 12.5 MG capsule TAKE 1 CAPSULE BY MOUTH EVERY OTHER DAY. CONTACT  THE OFFICE TO SCHEDULE APPOINTMENT FOR REFILLS (Patient taking differently: Take 12.5 mg by mouth See admin instructions. Takes mon, wed and fri) 45 capsule 1   HYDROcodone-acetaminophen (NORCO) 7.5-325 MG tablet Take 1 tablet by mouth every 6 (six) hours as needed for moderate pain or severe pain.       isosorbide mononitrate (IMDUR) 60 MG 24 hr tablet TAKE 1 AND 1/2 TABLET (90 MG TOTAL) BY MOUTH DAILY. **NEED OFFICE VISIT 45 tablet 3   levothyroxine (SYNTHROID) 150 MCG tablet Take 1 tablet (150 mcg total) by mouth daily at 6 (six) AM. 30 tablet 2   Omega-3 Fatty Acids (FISH OIL) 1200 MG CAPS Take 1,200 mg by mouth 2 (two) times daily.      rosuvastatin (CRESTOR) 5 MG tablet Take 5 mg by mouth daily.     umeclidinium-vilanterol (ANORO ELLIPTA) 62.5-25 MCG/ACT AEPB Inhale 1 puff into the lungs daily. 14 each 5   losartan (COZAAR) 50 MG tablet TAKE 1 TABLET BY MOUTH EVERY DAY *MUST KEEP UPCOMING APPT FOR REFILLS* 90 tablet 1   metoprolol succinate (TOPROL-XL) 25 MG 24 hr tablet Take 0.5 tablets (12.5 mg total) by mouth every other day. 23 tablet 3   No current facility-administered medications for this visit.    Socially he is married has 3 children. There is a remote tobacco history. He has tried to limit his sodium intake.  His wife also is my patient was diagnosed with recent breast cancer.  ROS General: Negative; No fevers, chills, or night sweats;  HEENT: Negative; No changes in vision or hearing, sinus congestion, difficulty swallowing Pulmonary: Positive for COPD, with occasional wheezing;  mild shortness of breath with uphill walking Cardiovascular: See history of present illness GI: Hiatal hernia symptoms has resolved  since his Nissen fundoplication; No nausea, vomiting, diarrhea, or abdominal pain GU: Negative; No dysuria, hematuria, or difficulty voiding Musculoskeletal: Negative; no myalgias, joint pain, or weakness Hematologic/Oncology: Negative; no easy bruising, bleeding Endocrine: History of hypothyroidism Neuro: Negative; no changes in balance, headaches; occasional numbness in his hands. Skin: Negative; No rashes or skin lesions Psychiatric: Negative; No behavioral problems, depression Sleep:   Positive for increased upper airway resistance;no bruxism, restless legs,  hypnogognic hallucinations, no cataplexy Other comprehensive 14 point system review is negative.   PE BP 128/84   Pulse (!) 46   Ht 5\' 8"  (1.727 m)   Wt 204 lb 6.4 oz (92.7 kg)   SpO2 95%   BMI 31.08 kg/m    Repeat blood pressure by me was 120/62  Wt Readings from Last 3 Encounters:  10/24/22 204 lb 6.4 oz (92.7 kg)  09/12/22 206 lb (93.4 kg)  07/11/22 208 lb (94.3 kg)   General: Alert, oriented, no distress.  Skin: normal turgor, no rashes, warm and dry HEENT: Normocephalic, atraumatic. Pupils equal round and reactive to light; sclera anicteric; extraocular muscles intact; Nose without nasal septal hypertrophy Mouth/Parynx benign; Mallinpatti scale 3 Neck: No JVD, no carotid bruits; normal carotid upstroke Lungs: clear to ausculatation and percussion; no wheezing or rales Chest wall: without tenderness to palpitation Heart: PMI not displaced, bradycardic in the 40s, s1 s2 normal, 1/6 systolic murmur, no diastolic murmur, no rubs, gallops, thrills, or heaves Abdomen: soft, nontender; no hepatosplenomehaly, BS+; abdominal aorta nontender and not dilated by palpation. Back: no CVA tenderness Pulses 2+ Musculoskeletal: full range of motion, normal strength, no joint deformities Extremities: no clubbing cyanosis or edema, Homan's sign negative  Neurologic: grossly nonfocal; Cranial nerves grossly wnl Psychologic: Normal  mood and affect  Oct 24, 2022 ECG (independently read by me): Sinus bradycardia at 46, LBBB, LAD  August 25, 2020 ECG (independently read by me): NSR at 60; Nonspecific intraventricular block  February 20, 2020 ECG (independently read by me): Marked Sinus Bradycardia at 44, LBBB  April 21, 2019 ECG (independently read by me): Normal sinus rhythm at 65 bpm.  Left bundle branch block.  QTc interval 453 ms  September 2020 ECG (independently read by me): Normal sinus rhythm at 61 bpm.  Left axis deviation, left bundle branch block.  September 2019 ECG  (independently read by me): Sinus bradycardia 47 bpm.  Left bundle branch block with repolarization changes.  October 2018 ECG (independently read by me): Sinus bradycardia 52 bpm.  Nonspecific IVCD.  Possible lateral Q waves.  QTc interval 451 ms.  May 2018 ECG (independently read by me): Sinus bradycardia at 47 bpm.  Left bundle branch block with repolarization changes.  Nose other significant ST changes.  March 2018 ECG (independently read by me):Normal sinus rhythm at 68 bpm.  Left bundle branch block with repolarization changes.  Her interval 166 ms; QTc interval 467 ms.  December 2016 ECG (independently read by me): Sinus bradycardia 58 bpm.  Left bundle branch block.  PR interval 148 ms.    June 2016 ECG (independently read by me): Sinus bradycardia 52 bpm.  Left bundle branch block with repolarization changes.  April 2016 ECG (independently read by me): Sinus rhythm at 62 bpm.  PVC.  QTc interval 475 ms.  September 2015 ECG (independently read by me): Sinus bradycardia at 46 beats per minute.  Left bundle branch block with repolarization changes  05/13/2013 ECG: Sinus bradycardia 50 beats per minute. beats per minute with mild left axis deviation and left bundle branch block. There is complete resolution of prior ectopy.   LABS     Latest Ref Rng & Units 07/04/2022    3:48 AM 07/03/2022    8:23 AM 07/03/2022    8:00 AM  BMP  Glucose 70 - 99 mg/dL 161   98   BUN 8 - 23 mg/dL 14   19   Creatinine 0.96 - 1.24 mg/dL 0.45   4.09   Sodium 811 - 145 mmol/L 138  141  139   Potassium 3.5 - 5.1 mmol/L 3.8  3.9  4.0   Chloride 98 - 111 mmol/L 111   103   CO2 22 - 32 mmol/L 23   25   Calcium 8.9 - 10.3 mg/dL 7.8   8.1         Latest Ref Rng & Units 07/04/2022   10:36 AM 07/03/2022    8:00 AM 09/08/2018   11:40 AM  Hepatic Function  Total Protein 6.5 - 8.1 g/dL  5.8  6.5   Albumin 3.5 - 5.0 g/dL 2.8  3.2  2.9   AST 15 - 41 U/L  39  92   ALT 0 - 44 U/L  16  49   Alk Phosphatase 38 -  126 U/L  69  67   Total Bilirubin 0.3 - 1.2 mg/dL  0.9  0.8        Latest Ref Rng & Units 07/04/2022    3:48 AM 07/03/2022    8:23 AM 07/03/2022    8:00 AM  CBC  WBC 4.0 - 10.5 K/uL 8.4   13.2   Hemoglobin 13.0 - 17.0 g/dL 91.4  78.2  95.6   Hematocrit 39.0 - 52.0 % 36.3  41.0  39.5   Platelets 150 - 400 K/uL 132   142    Lab Results  Component Value Date   TSH 19.162 (H) 07/03/2022    BNP    Component Value Date/Time   PROBNP 20.0 11/03/2013 1737    Lipid Panel     Component Value Date/Time   CHOL 117 09/01/2016 0926   TRIG 94 09/01/2016 0926   HDL 32 (L) 09/01/2016 0926   CHOLHDL 3.7 09/01/2016 0926   VLDL 19 09/01/2016 0926   LDLCALC 66 09/01/2016 0926     RADIOLOGY: No results found.  IMPRESSION:  1. CAD in native artery   2. Bradycardia   3. Left bundle branch block   4. Hyperlipidemia with target LDL less than 70   5. Infrarenal abdominal aortic aneurysm (AAA) without rupture (HCC)   6. Centrilobular emphysema (HCC)   7. Acquired hypothyroidism     ASSESSMENT AND PLAN: Matthew. Malick Tustin is a 72year-old white male who is 15 years status post suffering an  inferior ST segment elevation myocardial infarction and  successful stenting of his RCA with  staged cutting balloon to high grade LAD stenosis in April 2009.  A  nuclear perfusion study in 2016 was low risk and showed probable attenuation artifact versus possible left bundle branch block attenuation.  Importantly, there was no evidence for ischemia.  Ejection fraction was 45%, which corroborates with his EF estimate on his echo Doppler study. A  preoperative echo Doppler study showed an EF of 45-50% and he had grade 1 diastolic dysfunction.  He tolerated his shoulder surgery well but several days later developed increasing shortness of breath leading to hospitalization , which most likely was related to mild volume overload.  His troponins were positive with a flat plateau.  After undergoing nuclear imaging he was  referred for definitive cardiac catheterization which demonstrated  stable CAD with a widely patent RCA stent with mild nonobstructive disease.  He has experienced episodes of musculoskeletal chest wall pain particularly when he overdoes it doing weed eating.  Presently, he is not having anginal symptomatology.  He remains fairly active.  He is bradycardic with resting pulse at 46 and has experienced episodes of mild dizziness particular when he leans and bends forward.  He currently is on HCTZ 12.5 mg, isosorbide 90 mg, losartan 50 mg and metoprolol succinate 12.5 mg daily.  I have recommended that he reduce metoprolol and take this 12.5 mg every other day.  He continues to be on levothyroxine hide 150 mcg for hypothyroidism.  He is on over-the-counter fish oil and rosuvastatin 5 mg for hyperlipidemia.  Dr. Thomasena Edis will be checking his laboratory.  Target LDL is less than 70.  Presently he does not have any wheezing and continues to be on an oral Ellipta, followed by Matthew Evans.  He is sleeping well and not using CPAP.  He has stable appearing 3.6 cm AAA with short segment dissection in the right common iliac artery followed by Dr. Chestine Spore.  As long as he is stable, I will see him in 10 to 12 months for follow-up evaluation.   Lennette Bihari, MD, [facility]  10/30/2022 10:41 AM

## 2022-10-24 NOTE — Patient Instructions (Signed)
Medication Instructions:  Your physician has recommended you make the following change in your medication:  CHANGE: Metoprolol 12.5 mg (half a tablet) every other day  *If you need a refill on your cardiac medications before your next appointment, please call your pharmacy*   Lab Work: None.   Testing/Procedures: None   Follow-Up: At [facility], you and your health needs are our priority.  As part of our continuing mission to provide you with exceptional heart care, we have created designated Provider Care Teams.  These Care Teams include your primary Cardiologist (physician) and Advanced Practice Providers (APPs -  Physician Assistants and Nurse Practitioners) who all work together to provide you with the care you need, when you need it.   Your next appointment:    April/May  Provider:   Nicki Guadalajara, MD

## 2022-10-26 ENCOUNTER — Other Ambulatory Visit: Payer: Self-pay | Admitting: Cardiovascular Disease

## 2022-10-30 ENCOUNTER — Other Ambulatory Visit: Payer: Self-pay | Admitting: Cardiovascular Disease

## 2022-10-30 ENCOUNTER — Encounter: Payer: Self-pay | Admitting: Cardiovascular Disease

## 2022-10-31 ENCOUNTER — Encounter (HOSPITAL_BASED_OUTPATIENT_CLINIC_OR_DEPARTMENT_OTHER): Payer: BC Managed Care – PPO

## 2022-12-03 ENCOUNTER — Other Ambulatory Visit: Payer: Self-pay | Admitting: Cardiovascular Disease

## 2022-12-11 ENCOUNTER — Encounter (HOSPITAL_BASED_OUTPATIENT_CLINIC_OR_DEPARTMENT_OTHER): Payer: BC Managed Care – PPO

## 2022-12-12 LAB — COLOGUARD: COLOGUARD: NEGATIVE

## 2022-12-13 ENCOUNTER — Other Ambulatory Visit: Payer: Self-pay | Admitting: Cardiovascular Disease

## 2023-01-09 ENCOUNTER — Ambulatory Visit (HOSPITAL_BASED_OUTPATIENT_CLINIC_OR_DEPARTMENT_OTHER)
Admission: RE | Admit: 2023-01-09 | Discharge: 2023-01-09 | Disposition: A | Discharge status: Home / Self Care | Payer: BC Managed Care – PPO | Source: Ambulatory Visit | Attending: Pulmonary Disease | Admitting: Pulmonary Disease

## 2023-01-09 DIAGNOSIS — J432 Centrilobular emphysema: Secondary | ICD-10-CM | POA: Diagnosis not present

## 2023-01-12 ENCOUNTER — Ambulatory Visit (HOSPITAL_BASED_OUTPATIENT_CLINIC_OR_DEPARTMENT_OTHER): Payer: BC Managed Care – PPO

## 2023-01-15 ENCOUNTER — Telehealth: Payer: Self-pay | Admitting: Pulmonary Disease

## 2023-01-15 NOTE — Telephone Encounter (Signed)
PT returning call on CT results. No name left on their VM. Just said to call [facility]. Their # is [phone]

## 2023-01-17 ENCOUNTER — Other Ambulatory Visit (HOSPITAL_BASED_OUTPATIENT_CLINIC_OR_DEPARTMENT_OTHER): Payer: BC Managed Care – PPO

## 2023-01-18 NOTE — Telephone Encounter (Signed)
Patient spoke with CMA on 8/12 at 4:37pm to discuss results. Per CMA notes, "Spoke with patient regarding results and recommendations. Patient voiced understanding. No further questions or concerns."

## 2023-01-19 ENCOUNTER — Ambulatory Visit: Payer: BC Managed Care – PPO | Admitting: Pulmonary Disease

## 2023-02-08 ENCOUNTER — Other Ambulatory Visit: Payer: Self-pay | Admitting: Pulmonary Disease

## 2023-02-08 ENCOUNTER — Encounter: Payer: Self-pay | Admitting: Pulmonary Disease

## 2023-02-08 ENCOUNTER — Ambulatory Visit (INDEPENDENT_AMBULATORY_CARE_PROVIDER_SITE_OTHER): Payer: BC Managed Care – PPO | Admitting: Pulmonary Disease

## 2023-02-08 VITALS — BP 126/59 | HR 52 | Ht 68.0 in | Wt 192.8 lb

## 2023-02-08 DIAGNOSIS — J432 Centrilobular emphysema: Secondary | ICD-10-CM

## 2023-02-08 LAB — PULMONARY FUNCTION TEST
DL/VA % pred: 101 %
DL/VA: 4.09 ml/min/mmHg/L
DLCO cor % pred: 83 %
DLCO cor: 20.16 ml/min/mmHg
DLCO unc % pred: 83 %
DLCO unc: 20.16 ml/min/mmHg
FEF 25-75 Post: 1.98 L/sec
FEF 25-75 Pre: 2.52 L/sec
FEF2575-%Change-Post: -21 %
FEF2575-%Pred-Post: 90 %
FEF2575-%Pred-Pre: 115 %
FEV1-%Change-Post: -4 %
FEV1-%Pred-Post: 88 %
FEV1-%Pred-Pre: 93 %
FEV1-Post: 2.59 L
FEV1-Pre: 2.73 L
FEV1FVC-%Change-Post: -3 %
FEV1FVC-%Pred-Pre: 105 %
FEV6-%Change-Post: -1 %
FEV6-%Pred-Post: 92 %
FEV6-%Pred-Pre: 93 %
FEV6-Post: 3.5 L
FEV6-Pre: 3.55 L
FEV6FVC-%Change-Post: 0 %
FEV6FVC-%Pred-Post: 106 %
FEV6FVC-%Pred-Pre: 106 %
FVC-%Change-Post: -1 %
FVC-%Pred-Post: 87 %
FVC-%Pred-Pre: 88 %
FVC-Post: 3.51 L
FVC-Pre: 3.55 L
Post FEV1/FVC ratio: 74 %
Post FEV6/FVC ratio: 100 %
Pre FEV1/FVC ratio: 77 %
Pre FEV6/FVC Ratio: 100 %

## 2023-02-08 MED ORDER — PROAIR RESPICLICK 108 (90 BASE) MCG/ACT IN AEPB: 1.0000 | INHALATION_SPRAY | Freq: Every day | RESPIRATORY_TRACT | 5 refills | Status: DC | PRN

## 2023-02-08 NOTE — Progress Notes (Signed)
Attempted Full PFT: Performed Pre/Post Spirometry & DLCO today;

## 2023-02-08 NOTE — Progress Notes (Signed)
   Subjective:    Patient ID: Matthew Evans, male    DOB: 1950-06-11, 72 y.o.   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 He also has bilateral patchy groundglass and concern as this may be related to CT ILD due to history of dermatomyositis  We obtained repeat high-res CT chest which does not show significant ILD Also reviewed PFTs today which appear normal   Significant tests/ events reviewed   PFTs 01/2023 -, ratio 77, FEV1 93%, FVC 88%, DLCO 83% PFT's 2014: FEV1 2.04 (68%), +15% increase with BD, no restriction, normal DLCO, +airtrapping.    08/2016  Spirometry -FEV1 at 68%, ratio 73, FVC 69%     HRCT 01/2023 mild to moderate emphysema,  stable  benign mucoid impaction, no ILD   CTA c/A/P  06/2022 >>  Stable chronic right-sided rib fractures with associated scarring in the right lung. Patchy ground-glass opacities in both lungs - unchanged    CTA 10/2018 Scattered areas of peripheral fibrosis and lower lobe bronchiectatic change.  CT angio 09/2016 nodular scarring right upper lobe 5 mm 05/2018 CT chest without contrast 6 mm nodule along right minor fissure, unchanged  Review of Systems neg for any significant sore throat, dysphagia, itching, sneezing, nasal congestion or excess/ purulent secretions, fever, chills, sweats, unintended wt loss, pleuritic or exertional cp, hempoptysis, orthopnea pnd or change in chronic leg swelling. Also denies presyncope, palpitations, heartburn, abdominal pain, nausea, vomiting,  diarrhea or change in bowel or urinary habits, dysuria,hematuria, rash, arthralgias, visual complaints, headache, numbness weakness or ataxia.     Objective:   Physical Exam  Gen. Pleasant, well-nourished, in no distress ENT - no thrush, no pallor/icterus,no post nasal drip Neck: No JVD, no thyromegaly, no carotid bruits Lungs: no use of accessory muscles, no dullness to percussion, clear without rales or rhonchi  Cardiovascular: Rhythm regular, heart sounds  normal, no murmurs or gallops, no peripheral edema Musculoskeletal: No deformities, no cyanosis or clubbing         Assessment & Plan:

## 2023-02-08 NOTE — Patient Instructions (Signed)
Attempted Full PFT: Performed Pre/Post Spirometry & DLCO today;

## 2023-02-08 NOTE — Assessment & Plan Note (Addendum)
He has mild emphysema by imaging but PFTs appear normal. I have asked him to DC Anoro. He can use albuterol on an as-needed basis Vaccinations as would be routine flu and RSV advised There is no evidence of ILD

## 2023-02-08 NOTE — Patient Instructions (Addendum)
Lung function is good CT did not show scarring  X refill on albuterol Take Anoro as needed only  Get RSV & flu shot this year

## 2023-04-09 ENCOUNTER — Other Ambulatory Visit: Payer: Self-pay | Admitting: Pulmonary Disease

## 2023-05-20 ENCOUNTER — Other Ambulatory Visit: Payer: Self-pay | Admitting: Cardiovascular Disease

## 2023-06-06 ENCOUNTER — Other Ambulatory Visit: Payer: Self-pay | Admitting: Cardiovascular Disease

## 2023-11-06 ENCOUNTER — Other Ambulatory Visit: Payer: Self-pay | Admitting: Cardiovascular Disease

## 2023-11-28 ENCOUNTER — Ambulatory Visit: Admitting: Cardiovascular Disease

## 2023-12-06 ENCOUNTER — Other Ambulatory Visit: Payer: Self-pay | Admitting: Cardiovascular Disease

## 2024-01-28 ENCOUNTER — Ambulatory Visit: Admitting: Physician Assistant

## 2024-01-30 ENCOUNTER — Other Ambulatory Visit: Payer: Self-pay

## 2024-01-30 MED ORDER — CLOPIDOGREL BISULFATE 75 MG PO TABS: 75.0000 mg | ORAL_TABLET | Freq: Every day | ORAL | 0 refills | Status: DC

## 2024-02-28 ENCOUNTER — Encounter: Payer: Self-pay | Admitting: Physician Assistant

## 2024-02-28 ENCOUNTER — Ambulatory Visit: Attending: Physician Assistant | Admitting: Physician Assistant

## 2024-02-28 VITALS — BP 130/67 | HR 60 | Ht 68.0 in | Wt 203.0 lb

## 2024-02-28 DIAGNOSIS — I2510 Atherosclerotic heart disease of native coronary artery without angina pectoris: Secondary | ICD-10-CM

## 2024-02-28 DIAGNOSIS — E039 Hypothyroidism, unspecified: Secondary | ICD-10-CM

## 2024-02-28 DIAGNOSIS — R079 Chest pain, unspecified: Secondary | ICD-10-CM

## 2024-02-28 DIAGNOSIS — R0602 Shortness of breath: Secondary | ICD-10-CM

## 2024-02-28 DIAGNOSIS — R001 Bradycardia, unspecified: Secondary | ICD-10-CM

## 2024-02-28 DIAGNOSIS — I7143 Infrarenal abdominal aortic aneurysm, without rupture: Secondary | ICD-10-CM

## 2024-02-28 DIAGNOSIS — I714 Abdominal aortic aneurysm, without rupture, unspecified: Secondary | ICD-10-CM

## 2024-02-28 DIAGNOSIS — I447 Left bundle-branch block, unspecified: Secondary | ICD-10-CM

## 2024-02-28 DIAGNOSIS — E785 Hyperlipidemia, unspecified: Secondary | ICD-10-CM

## 2024-02-28 DIAGNOSIS — R0609 Other forms of dyspnea: Secondary | ICD-10-CM

## 2024-02-28 MED ORDER — ROSUVASTATIN CALCIUM 5 MG PO TABS: 5.0000 mg | ORAL_TABLET | Freq: Every day | ORAL | 3 refills | Status: AC

## 2024-02-28 MED ORDER — METOPROLOL TARTRATE 25 MG PO TABS: ORAL_TABLET | ORAL | 0 refills | Status: AC

## 2024-02-28 NOTE — Patient Instructions (Signed)
 Medication Instructions:  Start Crestor  5 mg take one tablet daily *If you need a refill on your cardiac medications before your next appointment, please call your pharmacy*  Testing/Procedures:   Your cardiac CT will be scheduled at one of the below locations:    [facility] [address]  [place], [place] [address] [phone]   If scheduled at the [facility] at [facility] street, please enter the parking lot using the [address] entrance and use the FREE valet service at the patient drop-off area. Enter the building and check-in with registration on the main floor.  Please follow these instructions carefully (unless otherwise directed):  An IV will be required for this test and Nitroglycerin  will be given.  Hold all erectile dysfunction medications at least 3 days (72 hrs) prior to test. (Ie viagra, cialis, sildenafil, tadalafil, etc)   On the Night Before the Test: Be sure to Drink plenty of water. Do not consume any caffeinated/decaffeinated beverages or chocolate 12 hours prior to your test. Do not take any antihistamines 12 hours prior to your test.  If the patient has contrast allergy: Patient will need a prescription for Prednisone  and very clear instructions (as follows): Prednisone  50 mg - take 13 hours prior to test Take another Prednisone  50 mg 7 hours prior to test Take another Prednisone  50 mg 1 hour prior to test Take Benadryl  50 mg 1 hour prior to test Patient must complete all four doses of above prophylactic medications. Patient will need a ride after test due to Benadryl .  On the Day of the Test: Drink plenty of water until 1 hour prior to the test. Do not eat any food 1 hour prior to test. You may take your regular medications prior to the test.  Take metoprolol  (Lopressor ) two hours prior to test. If you take Furosemide /Hydrochlorothiazide /Spironolactone/Chlorthalidone, please HOLD on the morning of the  test. Patients who wear a continuous glucose monitor MUST remove the device prior to scanning. FEMALES- please wear underwire-free bra if available, avoid dresses & tight clothing  After the Test: Drink plenty of water. After receiving IV contrast, you may experience a mild flushed feeling. This is normal. On occasion, you may experience a mild rash up to 24 hours after the test. This is not dangerous. If this occurs, you can take Benadryl  25 mg, Zyrtec, Claritin , or Allegra and increase your fluid intake. (Patients taking Tikosyn should avoid Benadryl , and may take Zyrtec, Claritin , or Allegra) If you experience trouble breathing, this can be serious. If it is severe call 911 IMMEDIATELY. If it is mild, please call our office.  We will call to schedule your test 2-4 weeks out understanding that some insurance companies will need an authorization prior to the service being performed.   For more information and frequently asked questions, please visit our website : [email or web address]  For non-scheduling related questions, please contact the cardiac imaging nurse navigator should you have any questions/concerns: Cardiac Imaging Nurse Navigators Direct Office Dial: [phone]   For scheduling needs, including cancellations and rescheduling, please call [place], [phone].   Your physician has requested that you have an echocardiogram. Echocardiography is a painless test that uses sound waves to create images of your heart. It provides your doctor with information about the size and shape of your heart and how well your heart's chambers and valves are working. This procedure takes approximately one hour. There are no restrictions for this procedure. Please do NOT wear cologne,  perfume, aftershave, or lotions (deodorant is allowed). Please arrive 15 minutes prior to your appointment time.  Please note: We ask at that you not bring children with you during ultrasound (echo/  vascular) testing. Due to room size and safety concerns, children are not allowed in the ultrasound rooms during exams. Our front office staff cannot provide observation of children in our lobby area while testing is being conducted. An adult accompanying a patient to their appointment will only be allowed in the ultrasound room at the discretion of the ultrasound technician under special circumstances. We apologize for any inconvenience.   Follow-Up: At [facility], you and your health needs are our priority.  As part of our continuing mission to provide you with exceptional heart care, our providers are all part of one team.  This team includes your primary Cardiologist (physician) and Advanced Practice Providers or APPs (Physician Assistants and Nurse Practitioners) who all work together to provide you with the care you need, when you need it.  Your next appointment:   6 month(s)  Provider:   Georganna Archer, MD    We recommend signing up for the patient portal called MyChart.  Sign up information is provided on this After Visit Summary.  MyChart is used to connect with patients for Virtual Visits (Telemedicine).  Patients are able to view lab/test results, encounter notes, upcoming appointments, etc.  Non-urgent messages can be sent to your provider as well.   To learn more about what you can do with MyChart, go to [email or web address].

## 2024-02-28 NOTE — Progress Notes (Addendum)
 Cardiology Office Note   Date:  02/28/2024  ID:  Matthew Evans, DOB 1950-09-18, MRN [id number] PCP: Gerome Brunet, DO  [facility] Providers Cardiologist:  Georganna Archer, MD   History of Present Illness Matthew Evans is a 73 y.o. male with a past medical history of CAD, ST elevation inferior MI and underwent RCA stenting April 2009.  He underwent staged [place] to high-grade LAD stenosis which involved multiple branches and stenting was not done to reduce potential for jailing of his multiple branches.  August 2010 he was found to have 56% stenosis beyond his RCA stent in the LAD, intervention site remained patent.  He had documented abdominal aortic aneurysm and follow-up valuation with Doppler imaging 08/29/2012 was essentially unchanged from 1 year previously and showed measurement of 3.4 x 3.7 cm.  Additional problems include COPD, hypertension, hypothyroidism, hyperlipidemia, and GERD.  Nuclear study February 2014 remain low risk and demonstrated his mild apical hypokinesis with low normal wall motion without ischemia.  An echo Doppler study in February 2014 showed mild LVH with grade 1 DD and normal systolic function.  He did have aortic valve thickening without stenosis and mild left atrial dilation.  Had normal pulmonary pressures.  He has a history of palpitations which has been treated with low-dose beta-blocker therapy.  He has COPD.  Recently been on Anora in addition to when necessary albuterol .  He underwent a Niesen fundoplication and hiatal hernia surgery by Dr. Deitra May 2015.  Since that time he lost approximately 20 pounds.  In 2015 follow-up abdominal aortic ultrasound revealed a 3.6 x 3.3 cm with a small amount of atherosclerosis visualized, this was not hemodynamically significant.  This was not significantly changed from 1 year previously.  Had an emergency room evaluation April 2016.  Had been working aggressively at home in his yard and overdid it.  Presented  with presyncope and also had mild dyspnea.  He had noticed some mild dyspnea on exertion for the past several months.  Emergency room he was noted to have irregular heart rate and sinus rhythm with frequent PVCs.  PVCs were not conducted so his resting pulse rates was bradycardic.  Echocardiogram April 2016 showed LVEF 40 to 45% with diffuse hypokinesis and grade 2 DD.  Mild mitral annular calcification.  His left atrium is mildly dilated.  Right ventricle is normal.  Right perfusion study was done same day and was interpreted as low risk.  He has a LBBB.  There was a small mild fixed anterior defect consistent with soft tissue attenuation versus possible left bundle branch block abnormality.  No ischemia.  Abdominal ultrasound June 2016 showed aortic aneurysm which was not significantly changed.  At times where episodes of chest pain but typically most of his symptoms are related to shortness of breath with activity.  He was post to be taking isosorbide  90 mg daily but only taking 60.  He had follow-up with his primary care prior to undergoing shoulder surgery with Dr. Mariea in 2019.  An echocardiogram was done which showed EF at slightly improved it was now 45 to 50%.  He apparently tolerated shoulder surgery well but after going home 2 days developed increased shortness of breath and presented to the hospital.  He had another echocardiogram with LVEF 45 to 50% and was referred for nuclear test which raise concern of possible ischemia in the inferior wall and probable scar in the anterior septal wall.  Ultimately, cardiac catheterization was done and revealed patent  RCA stent and 30% LAD and circumflex narrowing.  Angiographically there was no obvious culprit lesion to explain symptoms.  He underwent a sleep study August 2018 and no significant sleep apnea was documented.  Feels like he was sleeping better.  Was seen again in October 2018 at which time he remained stable from a cardiac standpoint.  In  September 2019 he had sinus bradycardia with ventricular rate of 47 with left bundle branch block and metoprolol  succinate was reduced.  Developed COVID-19 and was hospitalized April 5 through 17, 2020 with significant bilateral patchy areas of airspace disease consistent with COVID-19 infection.  Plaquenil  x 5 days and azithromycin  x 8 days was administered.  September 2020 he was being evaluated for pulmonary care and had sharp musculoskeletal like chest pains.  He was then put out to annual visits.  September 2021 mild shortness of breath but he denied recurrent anginal symptoms.  2022 he had not been using CPAP therapy but believes he was sleeping well.  Blood pressure was stable on current medication regimen tolerating DAPT.  In January 2023 he presented to the ED with chills and there was some concern for sepsis.  Underwent CT of the chest and abdomen pelvis which showed a 3.6 cm AAA with short section dissection in the right common iliac artery.  He subsequently was evaluated by Dr. Gretta with [facility].  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  When he was last seen in May 2024 he believed he was sleeping well off CPAP.  Did experience some mild dizziness when bending forward.  Continue DAPT with aspirin  and Plavix .  On HCTZ 12.5 mg, isosorbide  90 mg, losartan  50 mg, and metoprolol  succinate 12.5 mg.  Continues to be on fish oil rosuvastatin  5 mg.  On his COPD medications.  Today, he presents with a history of coronary artery disease with shortness of breath on exertion.  He experiences shortness of breath when walking on an incline or bending down and getting up quickly, requiring him to stop to catch his breath. There is no associated chest tightness, pain, or pressure. This symptom has worsened over the last few years. He denies peripheral edema or palpitations.  He has coronary artery disease with stenting performed in 2009. A cardiac  catheterization in 2018 showed a 30% blockage in the LAD, a 30% blockage in a branch of the circumflex artery, and disease in the distal right coronary artery past the stent. He has not undergone significant cardiac testing since 2020.  His current medications include HCTZ, isosorbide  mononitrate, losartan , metoprolol  succinate, Plavix , and aspirin . He was previously on Crestor  but has not been taking it recently. His LDL was 55 and triglycerides were 81 in April.  Reports no chest pain, pressure, or tightness. No edema, orthopnea, PND. Reports no palpitations.   Discussed the use of AI scribe software for clinical note transcription with the patient, who gave verbal consent to proceed.   ROS: Pertinent ROS in HPI  Studies Reviewed EKG Interpretation Date/Time:  Thursday February 28 2024 15:15:51 EDT Ventricular Rate:  60 PR Interval:  188 QRS Duration:  156 QT Interval:  488 QTC Calculation: 488 R Axis:   130  Text Interpretation: Normal sinus rhythm Right axis deviation Non-specific intra-ventricular conduction block When compared with ECG of 03-Jul-2022 07:05, PREVIOUS ECG IS PRESENT Confirmed by Lucien Blanc [phone]) on 02/28/2024 3:40:47 PM   CT angio 07/03/2022 IMPRESSION: 1. No acute vascular findings identified. 2. Infrarenal  abdominal aortic aneurysm measuring up to 3.6 cm in diameter. Recommend follow-up ultrasound every 2 years. This recommendation follows ACR consensus guidelines: White Paper of the ACR Incidental Findings Committee II on Vascular Findings. J Am Coll Radiol 2013; 10:789-794. 3. Possible short-segment dissection within the right common iliac artery. 4. No evidence of acute pulmonary embolism or other acute chest findings. 5. Stable chronic right-sided rib fractures with associated scarring in the right lung. Patchy ground-glass opacities in both lungs are similar to previous study and may be due to scarring or atelectasis. 6. No acute findings in the  abdomen or pelvis.     Electronically Signed   By: Elsie Perone M.D.   On: 07/03/2022 10:15      Physical Exam VS:  BP 130/67   Pulse 60   Ht 5' 8 (1.727 m)   Wt 203 lb (92.1 kg)   SpO2 96%   BMI 30.87 kg/m        Wt Readings from Last 3 Encounters:  02/28/24 203 lb (92.1 kg)  02/08/23 192 lb 12.8 oz (87.5 kg)  02/08/23 193 lb 12.8 oz (87.9 kg)    GEN: Well nourished, well developed in no acute distress NECK: No JVD; No carotid bruits CARDIAC: RRR, no murmurs, rubs, gallops RESPIRATORY:  Clear to auscultation without rales, wheezing or rhonchi  ABDOMEN: Soft, non-tender, non-distended EXTREMITIES:  No edema; No deformity   ASSESSMENT AND PLAN  Atherosclerotic heart disease of native coronary artery with history of coronary stenting and stable angina equivalent symptoms Coronary stenting in 2009 with exertional dyspnea. 2018 catheterization showed 30% stenosis in LAD and circumflex, distal RCA disease past stent. Possible CAD progression or heart pump dysfunction. - Order lexiscan  myoview  - Order echocardiogram to evaluate heart pump function and heart valves. - Continue current medication regimen including Plavix  and aspirin . - Reinstate Crestor  (rosuvastatin ) for cholesterol management.  Infrarenal abdominal aortic aneurysm, without rupture, abdominal aortic aneurysm Infrarenal abdominal aortic aneurysm not evaluated since COVID-19 pandemic. - He will need a CTA of chest, abd, pelvis in the next 6 months to evaluate (06/2024)  Hyperlipidemia in the context of atherosclerotic cardiovascular disease Previously on Crestor , LDL 55 mg/dL, triglycerides 81 mg/dL in April, within target range. - Reinstate Crestor  (rosuvastatin ) and take at night to improve absorption and reduce daytime drowsiness. - We will need to update a lipid panel at his next visit  Bradycardia with left bundle-branch block Heart rate stable at 60 bpm with known left bundle-branch block. EKG  confirms stable findings.  Cutaneous lupus erythematosus (discoid lupus erythematosus) Well-managed with long sleeve clothing and topical treatments. No internal organ involvement. - Continue current management with protective clothing and topical treatments.      Dispo: He can follow-up in 6 months with MD  Signed, Orren LOISE Fabry, PA-C

## 2024-03-06 NOTE — Addendum Note (Signed)
 Addended by: LUCIEN ORREN SAILOR on: 03/06/2024 08:44 AM   Modules accepted: Orders

## 2024-03-07 ENCOUNTER — Other Ambulatory Visit: Payer: Self-pay | Admitting: Student in an Organized Health Care Education/Training Program

## 2024-03-07 ENCOUNTER — Ambulatory Visit (HOSPITAL_COMMUNITY)
Admission: RE | Admit: 2024-03-07 | Discharge: 2024-03-07 | Disposition: A | Discharge status: Home / Self Care | Source: Ambulatory Visit | Attending: Physician Assistant | Admitting: Physician Assistant

## 2024-03-07 DIAGNOSIS — R0602 Shortness of breath: Secondary | ICD-10-CM | POA: Diagnosis present

## 2024-03-07 DIAGNOSIS — R0609 Other forms of dyspnea: Secondary | ICD-10-CM | POA: Diagnosis present

## 2024-03-07 DIAGNOSIS — I714 Abdominal aortic aneurysm, without rupture, unspecified: Secondary | ICD-10-CM | POA: Diagnosis present

## 2024-03-07 MED ORDER — HYDROCHLOROTHIAZIDE 12.5 MG PO CAPS: 12.5000 mg | ORAL_CAPSULE | ORAL | 3 refills | Status: AC

## 2024-03-07 MED ORDER — IOHEXOL 350 MG/ML SOLN
80.0000 mL | Freq: Once | INTRAVENOUS | Status: AC | PRN
  Administered 2024-03-07: 80 mL via INTRAVENOUS

## 2024-03-14 ENCOUNTER — Ambulatory Visit: Payer: Self-pay | Admitting: Physician Assistant

## 2024-03-14 NOTE — Progress Notes (Signed)
 The patient has been notified of the result and verbalized understanding. All questions (if any) were answered.   Patient states he believes that he had a lung cancer screening within the last year. Patient mention he has an appt with his PCP on 03/28/24, he will ask her about the lung screening.

## 2024-03-17 ENCOUNTER — Other Ambulatory Visit: Payer: Self-pay | Admitting: Physician Assistant

## 2024-03-25 ENCOUNTER — Telehealth (HOSPITAL_COMMUNITY): Payer: Self-pay | Admitting: *Deleted

## 2024-03-25 ENCOUNTER — Telehealth: Payer: Self-pay | Admitting: Physician Assistant

## 2024-03-25 NOTE — Telephone Encounter (Signed)
*  STAT* If patient is at the pharmacy, call can be transferred to refill team.   1. Which medications need to be refilled? (please list name of each medication and dose if known)   clopidogrel  (PLAVIX ) 75 MG tablet    4. Which pharmacy/location (including street and city if local pharmacy) is medication to be sent to?  [id number] - [place], [place] - [address] [place] [address]     5. Do they need a 30 day or 90 day supply? 90  Spouse states he is completely out

## 2024-03-25 NOTE — Telephone Encounter (Signed)
 Left message on voicemail per DPR in reference to upcoming appointment scheduled on  04/02/24 with detailed instructions given per Myocardial Perfusion Study Information Sheet for the test. LM to arrive 15 minutes early, and that it is imperative to arrive on time for appointment to keep from having the test rescheduled. If you need to cancel or reschedule your appointment, please call the office within 24 hours of your appointment. Failure to do so may result in a cancellation of your appointment, and a $50 no show fee. Phone number given for call back for any questions. Claudene Ronal Quale, RN

## 2024-03-25 NOTE — Telephone Encounter (Signed)
 Refill sent in for year supply. Patient just seen by Orren Fabry PA.

## 2024-03-26 ENCOUNTER — Other Ambulatory Visit: Payer: Self-pay | Admitting: Physician Assistant

## 2024-03-26 DIAGNOSIS — R079 Chest pain, unspecified: Secondary | ICD-10-CM

## 2024-04-02 ENCOUNTER — Ambulatory Visit (HOSPITAL_BASED_OUTPATIENT_CLINIC_OR_DEPARTMENT_OTHER)
Admission: RE | Admit: 2024-04-02 | Discharge: 2024-04-02 | Disposition: A | Discharge status: Home / Self Care | Source: Ambulatory Visit | Attending: Cardiology | Admitting: Cardiology

## 2024-04-02 ENCOUNTER — Ambulatory Visit (HOSPITAL_COMMUNITY)
Admission: RE | Admit: 2024-04-02 | Discharge: 2024-04-02 | Disposition: A | Discharge status: Home / Self Care | Source: Ambulatory Visit | Attending: Physician Assistant | Admitting: Physician Assistant

## 2024-04-02 DIAGNOSIS — R0609 Other forms of dyspnea: Secondary | ICD-10-CM | POA: Diagnosis present

## 2024-04-02 DIAGNOSIS — R079 Chest pain, unspecified: Secondary | ICD-10-CM | POA: Diagnosis present

## 2024-04-02 LAB — MYOCARDIAL PERFUSION IMAGING
LV dias vol: 131 mL (ref 62–150)
LV sys vol: 64 mL (ref 4.2–5.8)
Nuc Stress EF: 51 %
Peak HR: 78 {beats}/min
Rest HR: 53 {beats}/min
Rest Nuclear Isotope Dose: 10.9 mCi
SDS: 1
SRS: 2
SSS: 3
ST Depression (mm): 0 mm
Stress Nuclear Isotope Dose: 31 mCi
TID: 0.92

## 2024-04-02 LAB — ECHOCARDIOGRAM COMPLETE
AR max vel: 1.85 cm2
AV Area VTI: 1.74 cm2
AV Area mean vel: 1.99 cm2
AV Mean grad: 4 mmHg
AV Peak grad: 8.2 mmHg
Ao pk vel: 1.43 m/s
Area-P 1/2: 2.38 cm2
S' Lateral: 3.72 cm

## 2024-04-02 MED ORDER — REGADENOSON 0.4 MG/5ML IV SOLN
INTRAVENOUS | Status: AC
  Filled 2024-04-02: qty 5

## 2024-04-02 MED ORDER — TECHNETIUM TC 99M TETROFOSMIN IV KIT
10.9000 | PACK | Freq: Once | INTRAVENOUS | Status: AC | PRN
  Administered 2024-04-02: 10.9 via INTRAVENOUS

## 2024-04-02 MED ORDER — TECHNETIUM TC 99M TETROFOSMIN IV KIT
31.0000 | PACK | Freq: Once | INTRAVENOUS | Status: AC | PRN
  Administered 2024-04-02: 31 via INTRAVENOUS

## 2024-04-02 MED ORDER — REGADENOSON 0.4 MG/5ML IV SOLN
0.4000 mg | Freq: Once | INTRAVENOUS | Status: AC
  Administered 2024-04-02: 0.4 mg via INTRAVENOUS

## 2024-04-07 ENCOUNTER — Ambulatory Visit: Payer: Self-pay | Admitting: Physician Assistant

## 2024-04-10 NOTE — Telephone Encounter (Signed)
 LVM asking pt to call our office to discuss test results. 3rd attempt

## 2024-04-30 ENCOUNTER — Other Ambulatory Visit: Payer: Self-pay | Admitting: Pulmonary Disease

## 2024-05-07 ENCOUNTER — Other Ambulatory Visit: Payer: Self-pay

## 2024-05-09 MED ORDER — LOSARTAN POTASSIUM 50 MG PO TABS: 50.0000 mg | ORAL_TABLET | Freq: Every day | ORAL | 2 refills | Status: AC

## 2024-05-09 MED ORDER — ISOSORBIDE MONONITRATE ER 60 MG PO TB24: 90.0000 mg | ORAL_TABLET | Freq: Every day | ORAL | 2 refills | Status: AC
